# Patient Record
Sex: Male | Born: 1950 | Race: White | Hispanic: No | Marital: Married | Smoking: Former smoker
Health system: Southern US, Community
[De-identification: ages and names within clinical notes are randomized; demographics above are authoritative.]

## PROBLEM LIST (undated history)

## (undated) DIAGNOSIS — I10 Essential (primary) hypertension: Secondary | ICD-10-CM

## (undated) DIAGNOSIS — I2510 Atherosclerotic heart disease of native coronary artery without angina pectoris: Secondary | ICD-10-CM

## (undated) DIAGNOSIS — K635 Polyp of colon: Secondary | ICD-10-CM

## (undated) DIAGNOSIS — I252 Old myocardial infarction: Secondary | ICD-10-CM

## (undated) DIAGNOSIS — I219 Acute myocardial infarction, unspecified: Secondary | ICD-10-CM

## (undated) DIAGNOSIS — M1990 Unspecified osteoarthritis, unspecified site: Secondary | ICD-10-CM

## (undated) DIAGNOSIS — E785 Hyperlipidemia, unspecified: Secondary | ICD-10-CM

## (undated) DIAGNOSIS — Z951 Presence of aortocoronary bypass graft: Secondary | ICD-10-CM

## (undated) DIAGNOSIS — R7303 Prediabetes: Secondary | ICD-10-CM

## (undated) DIAGNOSIS — K2270 Barrett's esophagus without dysplasia: Secondary | ICD-10-CM

## (undated) DIAGNOSIS — K219 Gastro-esophageal reflux disease without esophagitis: Secondary | ICD-10-CM

## (undated) HISTORY — DX: Acute myocardial infarction, unspecified: I21.9

## (undated) HISTORY — PX: EYE SURGERY: SHX253

## (undated) HISTORY — DX: Barrett's esophagus without dysplasia: K22.70

## (undated) HISTORY — DX: Prediabetes: R73.03

## (undated) HISTORY — DX: Hyperlipidemia, unspecified: E78.5

## (undated) HISTORY — PX: HEMORROIDECTOMY: SUR656

## (undated) HISTORY — DX: Gastro-esophageal reflux disease without esophagitis: K21.9

## (undated) HISTORY — DX: Presence of aortocoronary bypass graft: Z95.1

## (undated) HISTORY — DX: Atherosclerotic heart disease of native coronary artery without angina pectoris: I25.10

## (undated) HISTORY — DX: Essential (primary) hypertension: I10

## (undated) HISTORY — DX: Polyp of colon: K63.5

## (undated) HISTORY — DX: Unspecified osteoarthritis, unspecified site: M19.90

## (undated) HISTORY — DX: Old myocardial infarction: I25.2

---

## 1978-10-24 HISTORY — PX: OTHER SURGICAL HISTORY: SHX169

## 2003-06-16 ENCOUNTER — Encounter (INDEPENDENT_AMBULATORY_CARE_PROVIDER_SITE_OTHER): Payer: Self-pay | Admitting: Specialist

## 2003-06-16 ENCOUNTER — Ambulatory Visit (HOSPITAL_COMMUNITY): Admission: RE | Admit: 2003-06-16 | Discharge: 2003-06-16 | Payer: Self-pay | Admitting: *Deleted

## 2004-07-30 ENCOUNTER — Ambulatory Visit: Admission: RE | Admit: 2004-07-30 | Discharge: 2004-07-30 | Payer: Self-pay | Admitting: Endocrinology

## 2004-08-31 ENCOUNTER — Encounter (INDEPENDENT_AMBULATORY_CARE_PROVIDER_SITE_OTHER): Payer: Self-pay | Admitting: *Deleted

## 2004-08-31 ENCOUNTER — Ambulatory Visit (HOSPITAL_COMMUNITY): Admission: RE | Admit: 2004-08-31 | Discharge: 2004-08-31 | Payer: Self-pay | Admitting: *Deleted

## 2004-09-15 ENCOUNTER — Ambulatory Visit: Payer: Self-pay | Admitting: Pulmonary Disease

## 2004-10-19 ENCOUNTER — Ambulatory Visit: Payer: Self-pay | Admitting: Pulmonary Disease

## 2004-10-24 HISTORY — PX: LUNG BIOPSY: SHX232

## 2004-12-11 IMAGING — CR DG CHEST 1V PORT
1 series · 1 of 1 positions shown · non-contrast
Comparison: Done earlier the same day at [6J] hours.
 SINGLE VIEW CHEST PORTABLE:

CLINICAL DATA: Interstitial lung disease.

[view not recorded]
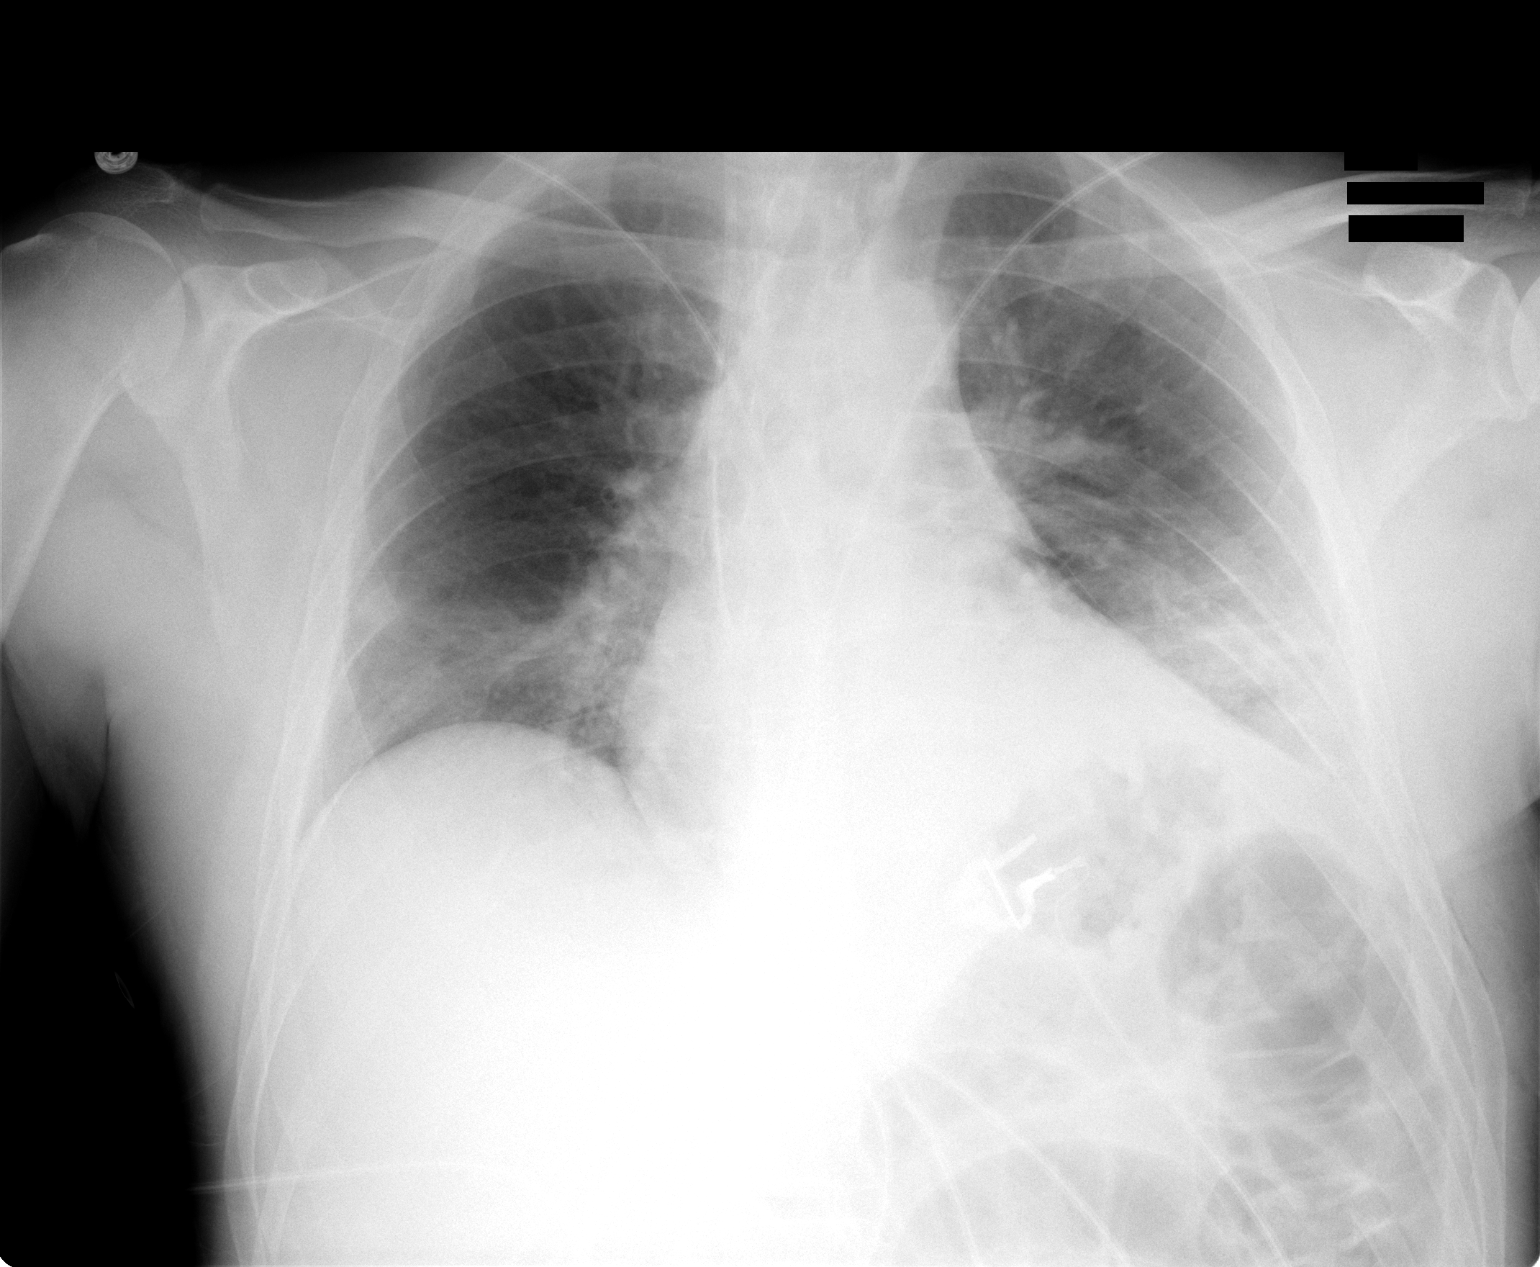

[1 of 1 positions shown; findings below may reference images not displayed]

FINDINGS: Left chest tube has been removed.  No pneumothorax.  Bibasilar air space disease is unchanged.
IMPRESSION: 1.  Interval removal of left chest tube without pneumothorax.
 2.   Stable bibasilar air space disease in this patient with history of   interstitial lung disease.

## 2005-01-17 ENCOUNTER — Ambulatory Visit: Payer: Self-pay | Admitting: Pulmonary Disease

## 2005-02-10 ENCOUNTER — Ambulatory Visit: Admission: RE | Admit: 2005-02-10 | Discharge: 2005-02-10 | Payer: Self-pay | Admitting: Pulmonary Disease

## 2005-02-10 ENCOUNTER — Ambulatory Visit: Payer: Self-pay | Admitting: Pulmonary Disease

## 2005-05-27 ENCOUNTER — Ambulatory Visit: Payer: Self-pay | Admitting: Pulmonary Disease

## 2005-05-30 ENCOUNTER — Ambulatory Visit: Payer: Self-pay

## 2005-06-03 ENCOUNTER — Ambulatory Visit (HOSPITAL_COMMUNITY): Admission: RE | Admit: 2005-06-03 | Discharge: 2005-06-03 | Payer: Self-pay | Admitting: Pulmonary Disease

## 2005-06-03 IMAGING — CT CT CHEST W/ CM
1 of 9 series · 9 of 32 positions shown, 12 images · IV contrast (omnipaque)
Comparison: none

CLINICAL DATA: One month shortness of breath, weight loss, and cough.
CHEST CT WITH CONTRAST ? [DATE]:
TECHNIQUE: Multidetector CT imaging of the chest was performed following the standard protocol during bolus administration of intravenous contrast.  High resolution lung windows were obtained at 1 mm collimation.
Contrast:  100 cc Omnipaque 300 IV.  
No comparisons.

[Series 9: chest routine 5.0 b30f · axial · 0.65mm/px · z∈[-291,-46]mm · 9 of 65 slices shown, 12 images]
[im 8/65  mediastinal]
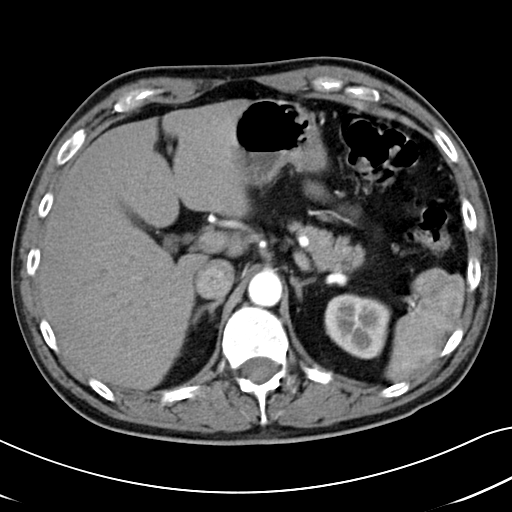
[im 8/65  lung]
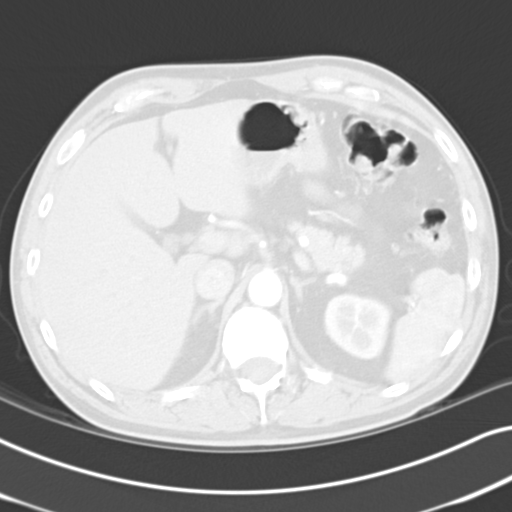
[im 15/65  lung]
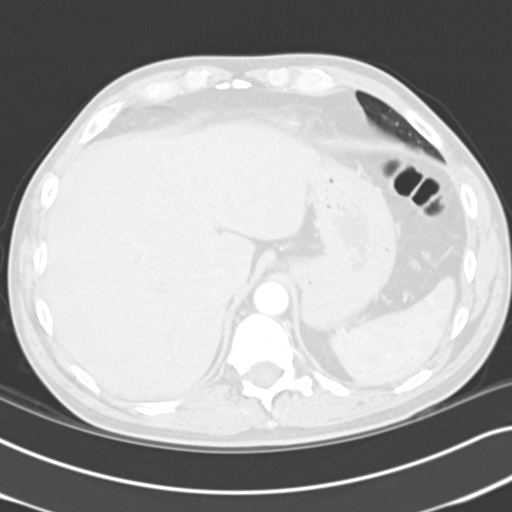
[im 22/65  lung]
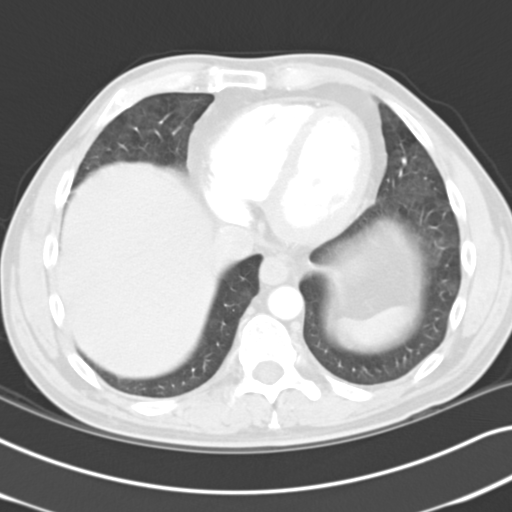
[im 29/65  lung]
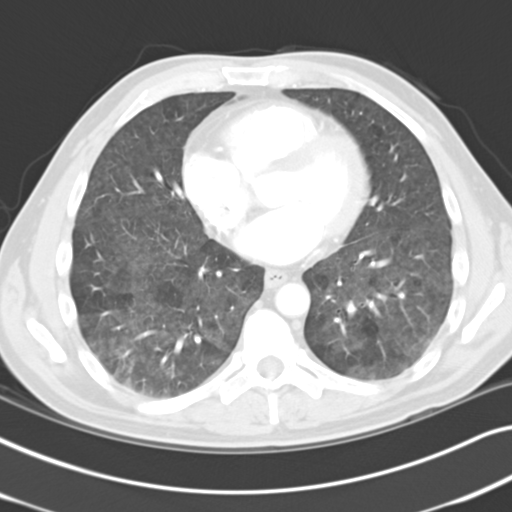
[im 32/65  mediastinal]
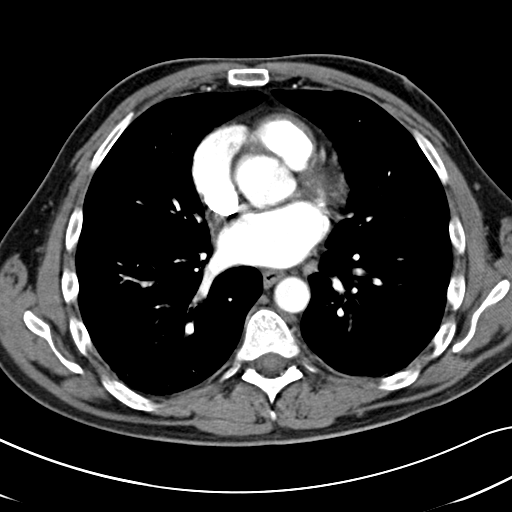
[im 32/65  lung]
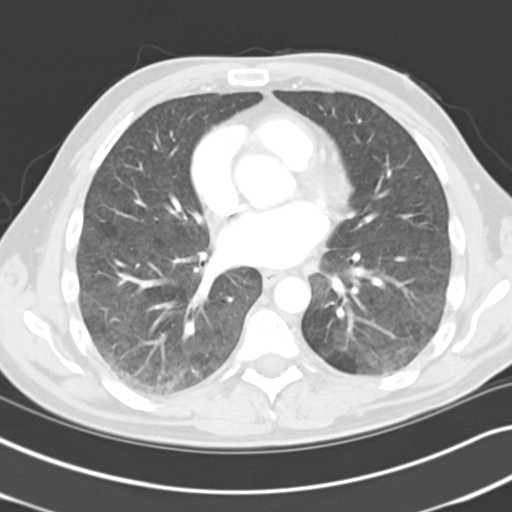
[im 36/65  lung]
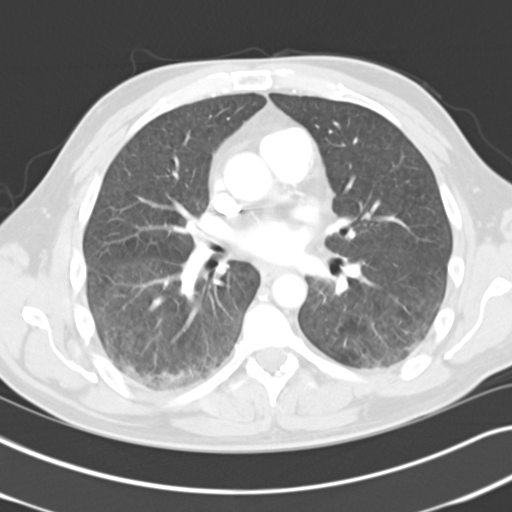
[im 43/65  lung]
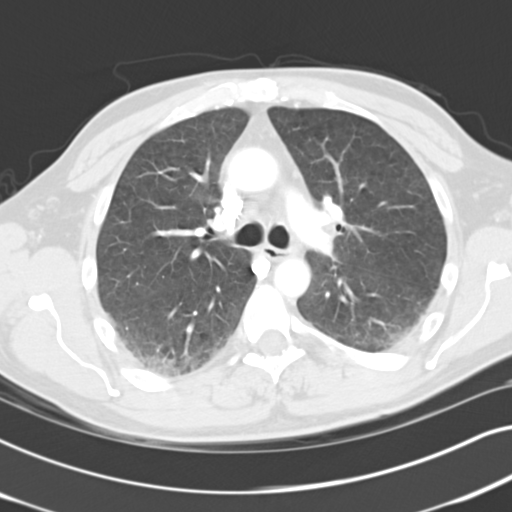
[im 50/65  lung]
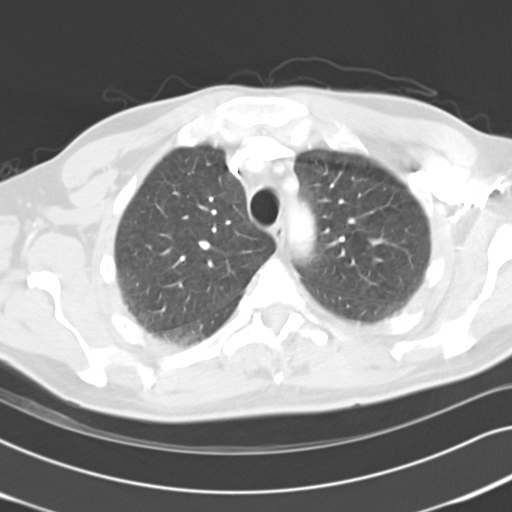
[im 57/65  mediastinal]
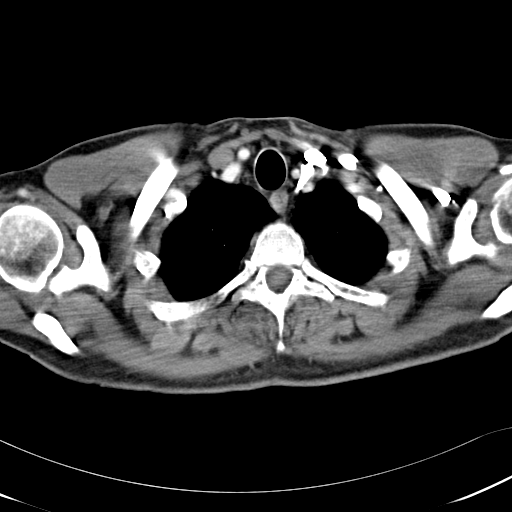
[im 57/65  lung]
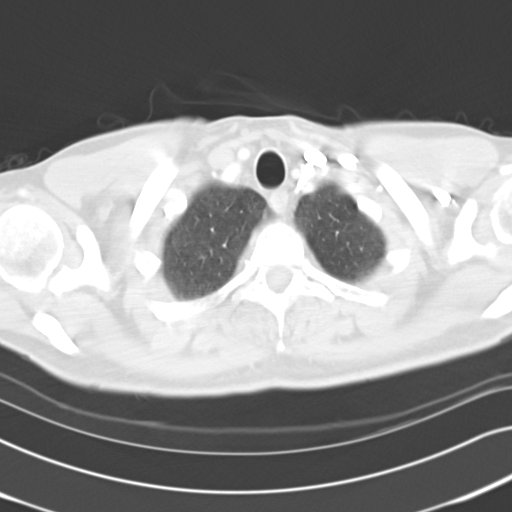

[9 of 32 positions shown; findings below may reference images not displayed]

FINDINGS: Best seen on high resolution pulmonary windows is diffuse inhomogeneous lung attenuation/ground glass opacites most prominently at the lower lobes.  The arborizing vessels in the areas of lower and higher attenuation are comparable in size favoring ground-glass pulmonary opacity over mosaic perfusion abnormality.)  This is a nonspecific finding and may be seen in viral pneumonitis (and in immunocompromised patients, pneumocystis carinii ? cytomegalovirus, and respiratory syncytial virus,etc.), hypersensitivity pneumonitis, Desquamative interstitial pneumonia (DIP), BOOP, and respiratory bronchiolitis interstitial lung disease (RBILD) to name some possibilities.  Increased number of small size mediastinal left hilar lymph nodes favors infectious inflammatory reactive adenitis with the largest subcarinal lymph node measuring 1.4 x 1.1 cm (image #25) and left mediastinal 1.5 x 0.8 cm and 7 mm left hilar lymph nodes (image #28).  No other significant mediastinal, hilar, or axillary adenopathy is seen with normal heart size with left ventricular hypertrophy.  Upper abdominal organs appear normal.
IMPRESSION: 1.  Areas of diffuse inhomogeneous lung attenuation/ground-glass opacities primarily at the lower lobe perihilar region with some differential diagnostic considerations considered (FINDINGS).  
2.  Slight mediastinal adenopathy favoring infectious inflammatory etiology.
3.  Slight left ventricular hypertrophy.
4.  Otherwise no significant abnormality.

## 2005-06-08 ENCOUNTER — Ambulatory Visit: Payer: Self-pay | Admitting: Pulmonary Disease

## 2005-07-07 IMAGING — CR DG CHEST 2V
3 series · 3 of 3 positions shown · non-contrast
Comparison: none

CLINICAL DATA: Interstitial lung disease, pre-admission respiratory film.  Lung mass.      
 CHEST - 2 VIEW:

[view not recorded (1 of 3)]
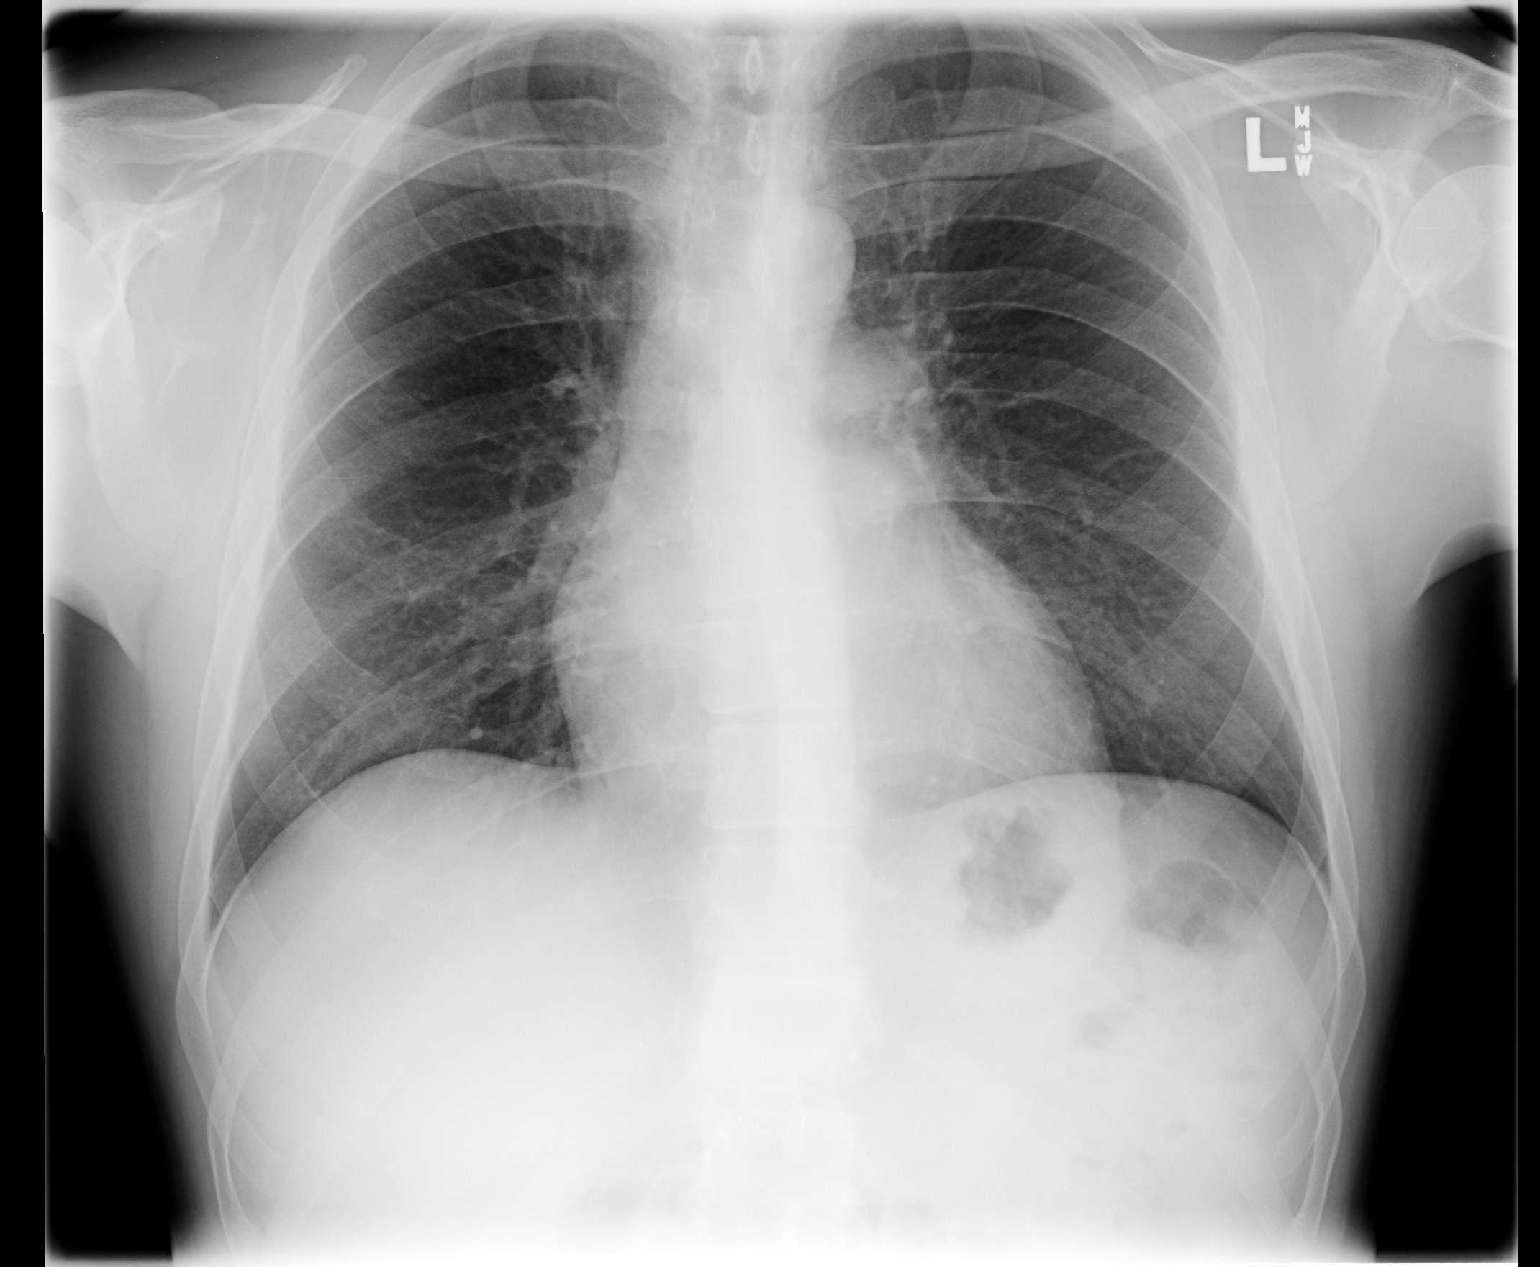

[view not recorded (2 of 3)]
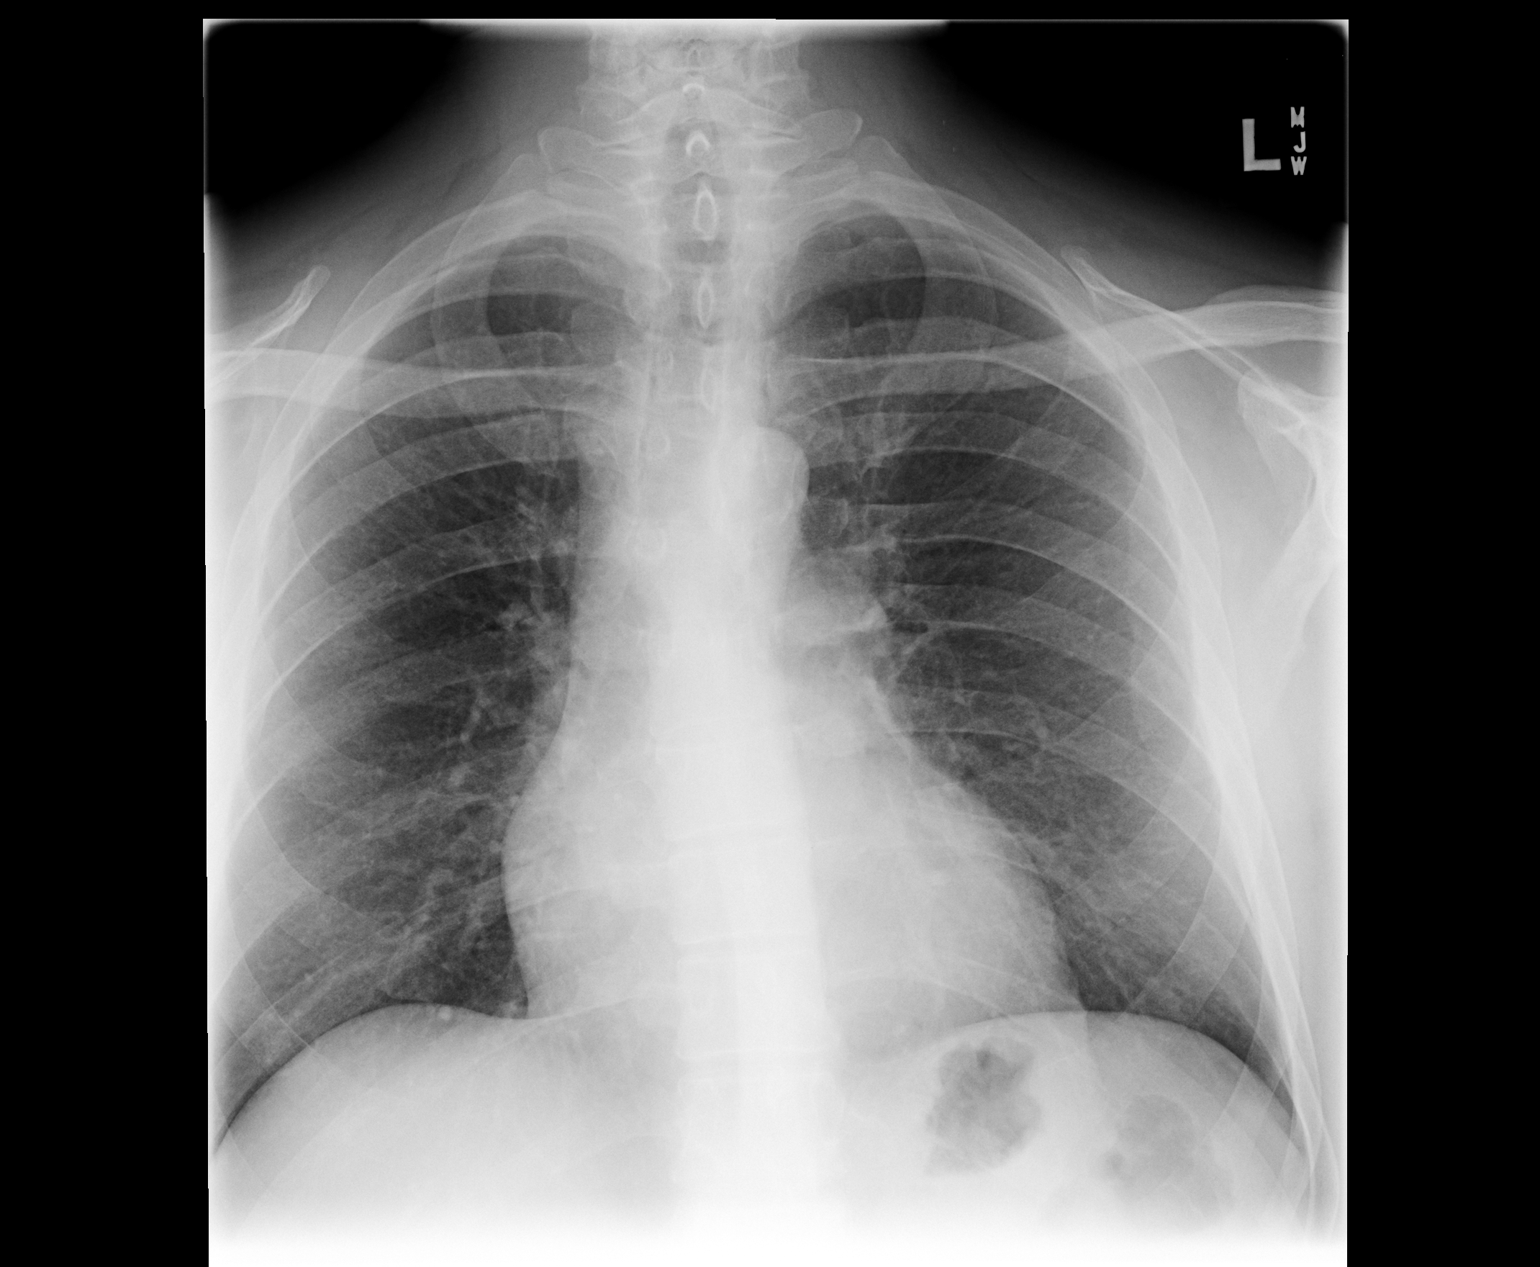

[view not recorded (3 of 3)]
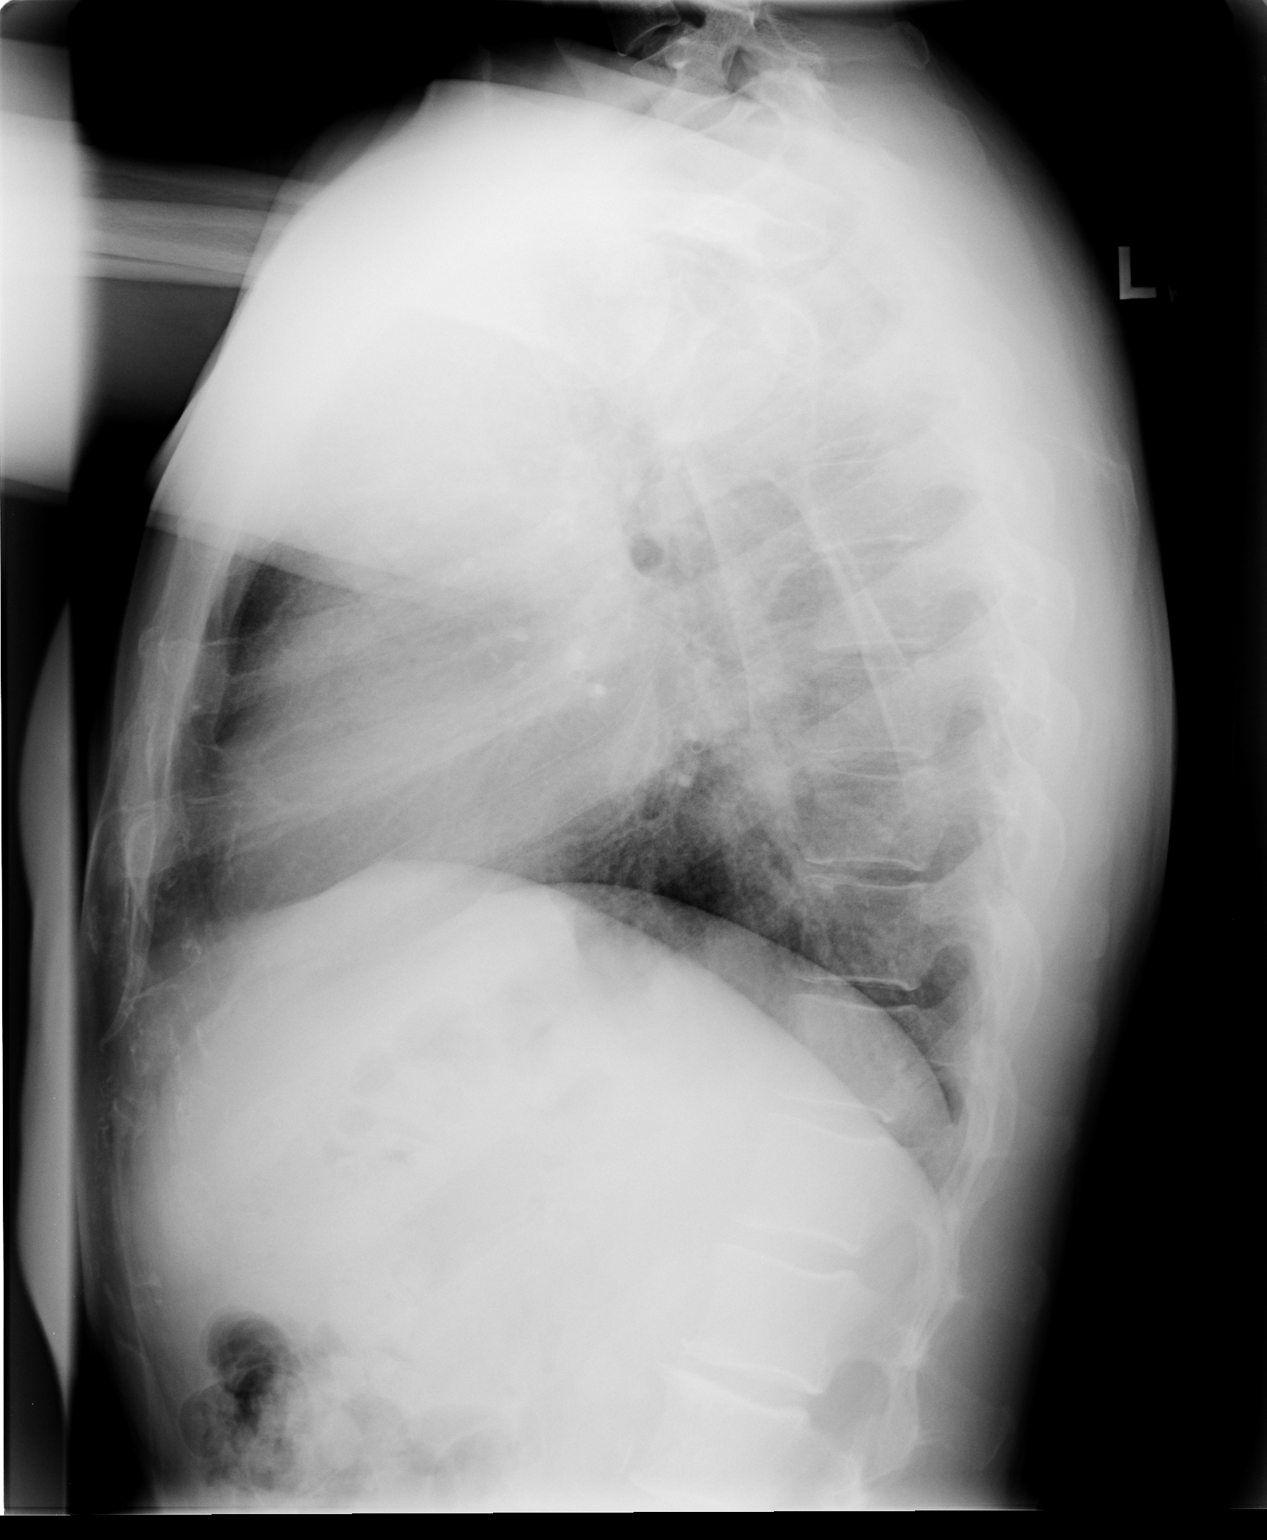

[3 of 3 positions shown; findings below may reference images not displayed]

FINDINGS: Lungs are clear.  Heart size is normal.  No effusion.
IMPRESSION: No acute disease.

## 2005-07-11 ENCOUNTER — Encounter (INDEPENDENT_AMBULATORY_CARE_PROVIDER_SITE_OTHER): Payer: Self-pay | Admitting: *Deleted

## 2005-07-11 ENCOUNTER — Inpatient Hospital Stay (HOSPITAL_COMMUNITY): Admission: RE | Admit: 2005-07-11 | Discharge: 2005-07-16 | Payer: Self-pay | Admitting: Thoracic Surgery

## 2005-07-11 IMAGING — CR DG CHEST 1V PORT
1 series · 1 of 1 positions shown · non-contrast
Comparison: none

CLINICAL DATA: Lingular biopsy

[view not recorded]
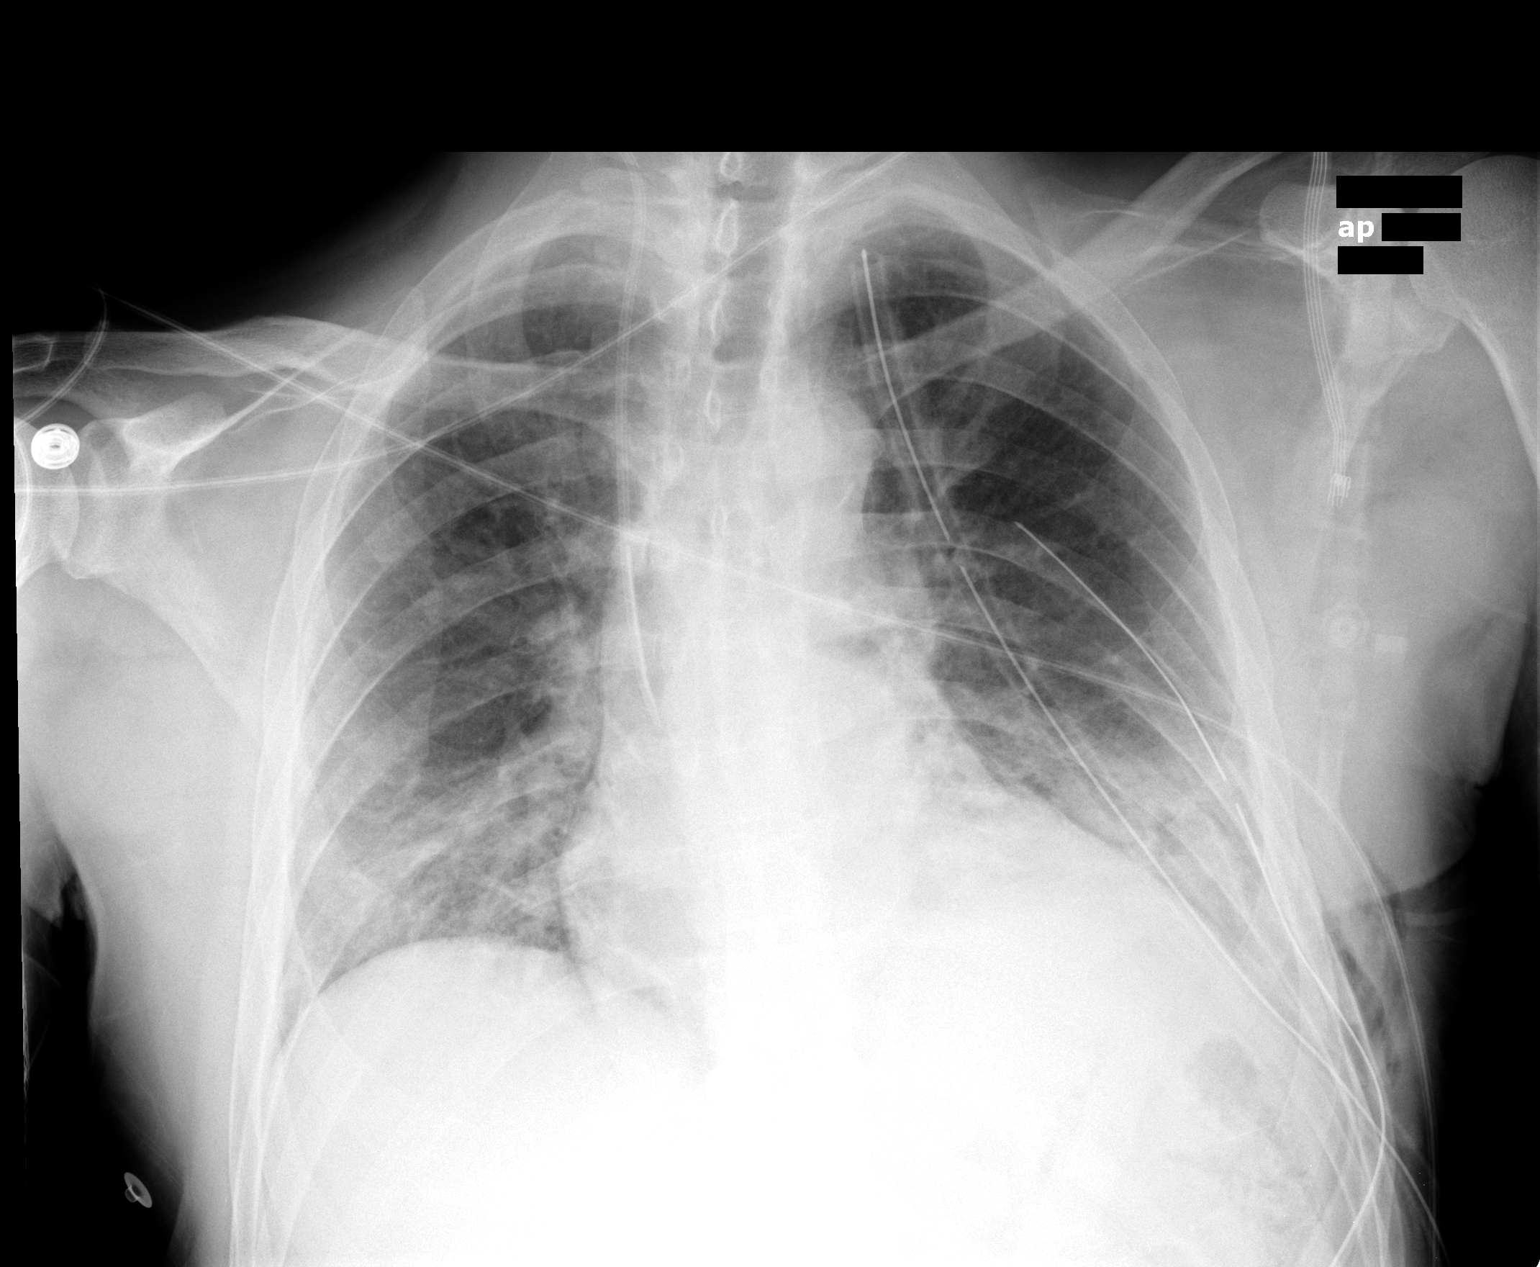

[1 of 1 positions shown; findings below may reference images not displayed]

Portable chest at 930:

Comparison to earlier film of the same day. There has been interval placement of
2 left chest tubes, with no pneumothorax seen. A right IJ central line has its
tip in the low SVC. Low lung volumes, with patchy infiltrates or atelectasis in
both lung bases left greater than right. No definite effusion. Heart size
appears upper limits normal.
IMPRESSION: 1. Two left chest tubes without pneumothorax.
2. Central line to low SVC. 
3. Bibasilar patchy infiltrates or atelectasis

## 2005-07-11 IMAGING — CR DG CHEST 2V
2 series · 2 of 2 positions shown · non-contrast
Comparison: Two view chest x-ray [DATE] and CT chest [DATE].

CLINICAL DATA: Interstitial lung disease. Pre-operative respiratory evaluation
prior to a VATS biopsy.

CHEST - 2 VIEW  [DATE]:

[view not recorded (1 of 2)]
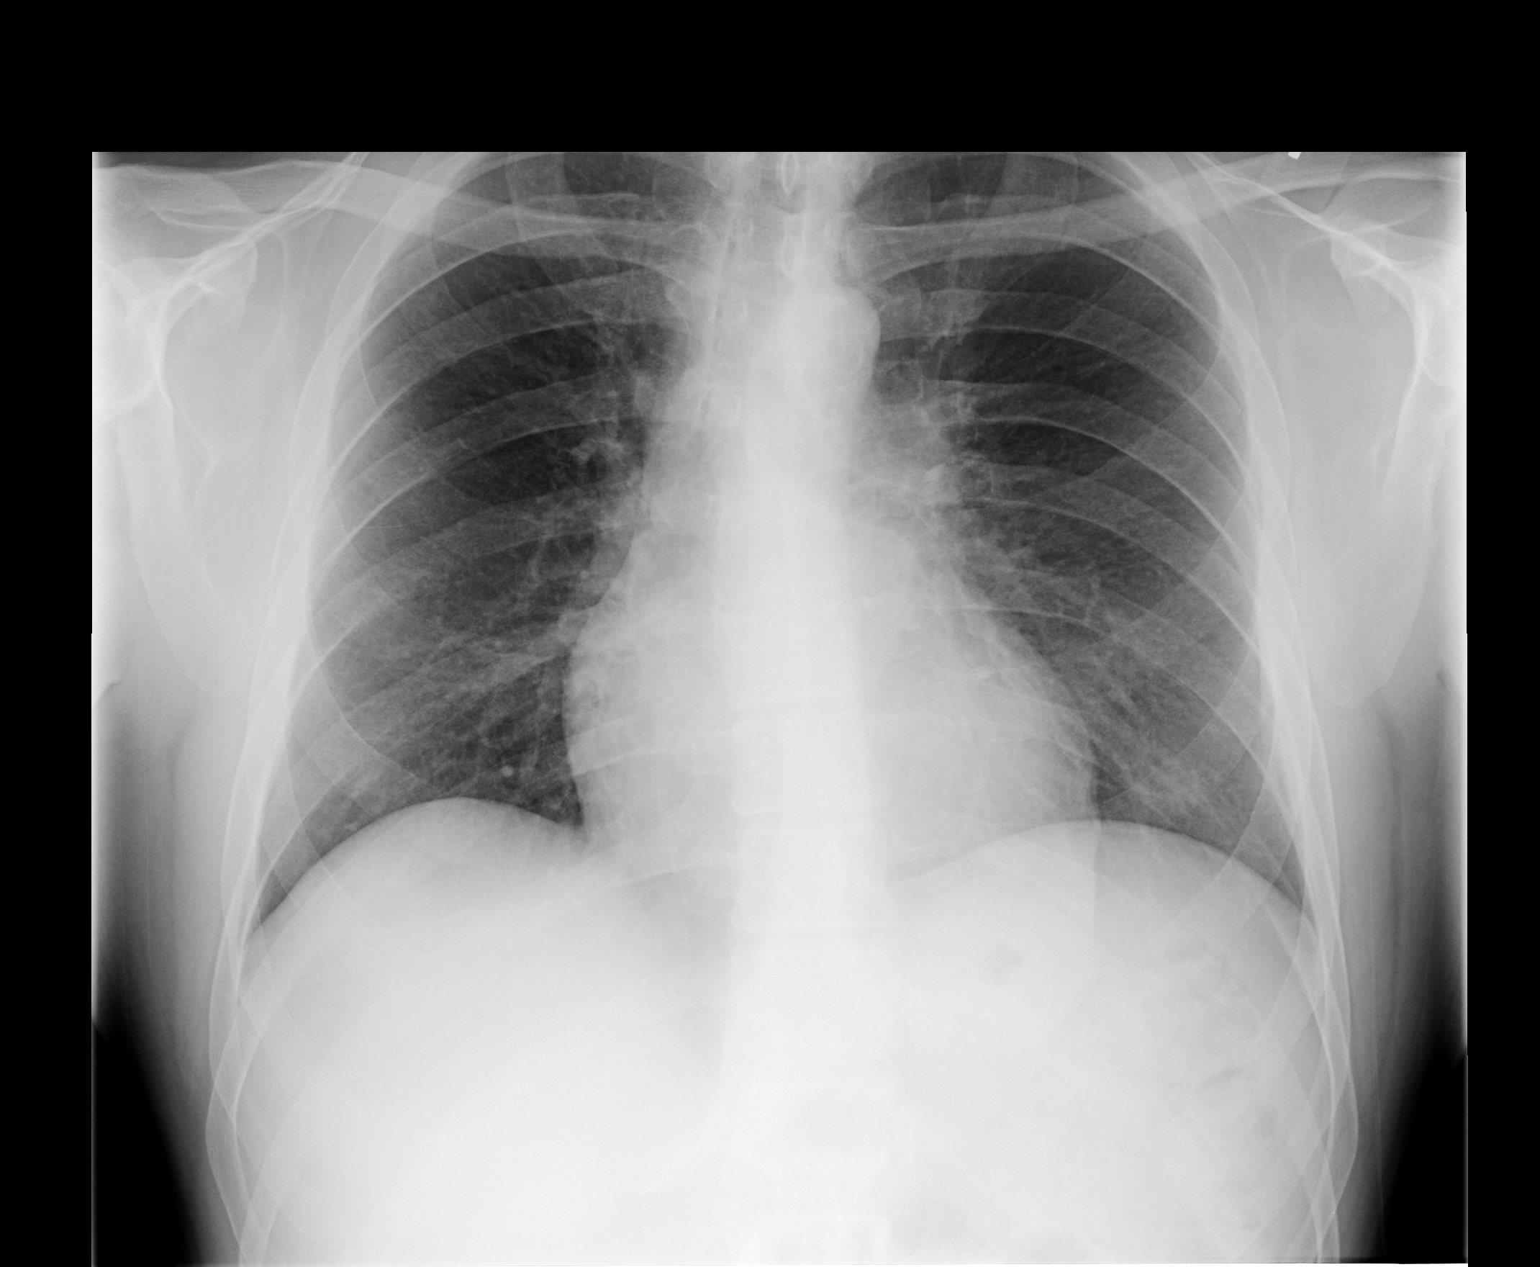

[view not recorded (2 of 2)]
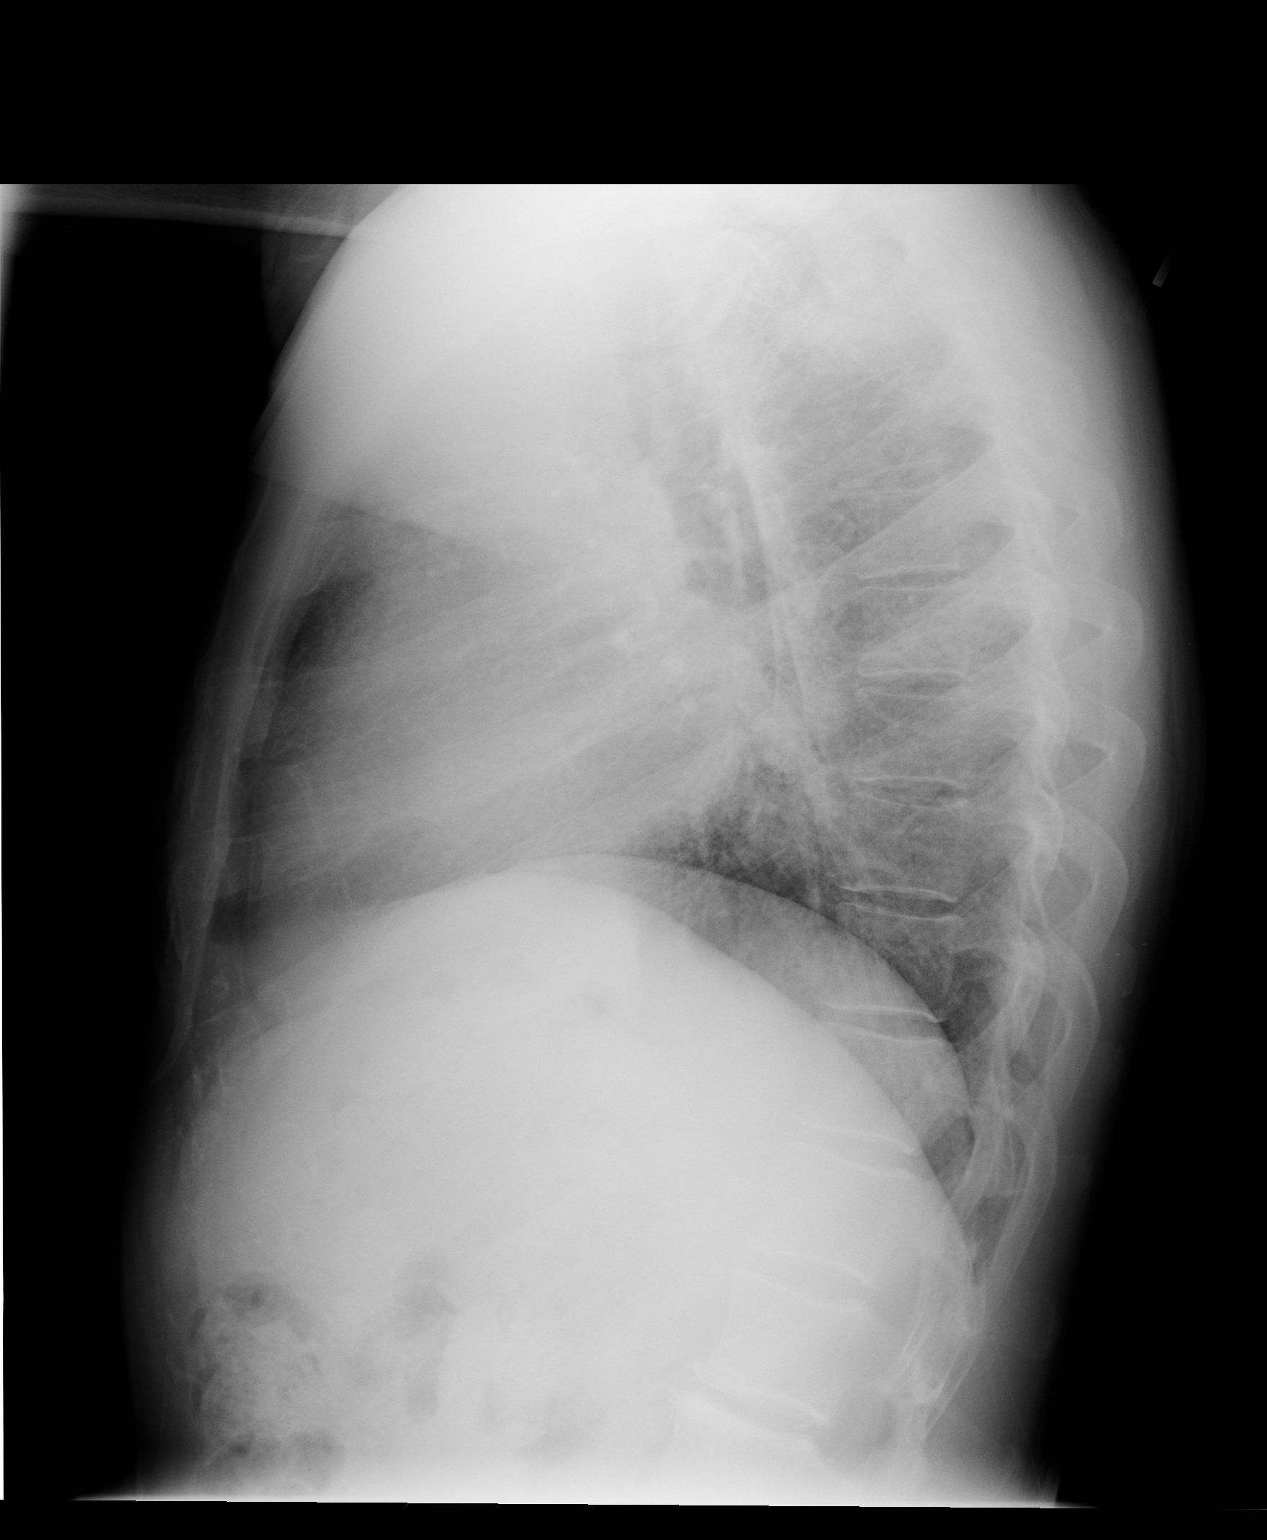

[2 of 2 positions shown; findings below may reference images not displayed]

FINDINGS: The heart size is normal. Minimal atherosclerosis is present in the
aortic arch. The hilar and mediastinal contours are otherwise unremarkable. Mild
diffuse interstitial lung disease is present and the lung volumes are low,
unchanged from before. There are no confluent areas of consolidation. There are
no pleural effusions. The visualized bony thorax appears intact.
IMPRESSION: Mild diffuse interstitial lung disease with low lung volumes. No acute
abnormalities.

## 2005-07-12 IMAGING — CR DG CHEST 1V PORT
1 series · 1 of 1 positions shown · non-contrast
Comparison: none

CLINICAL DATA: Interstitial lung disease.  
 PORTABLE ? 1 VIEW CHEST:

[view not recorded]
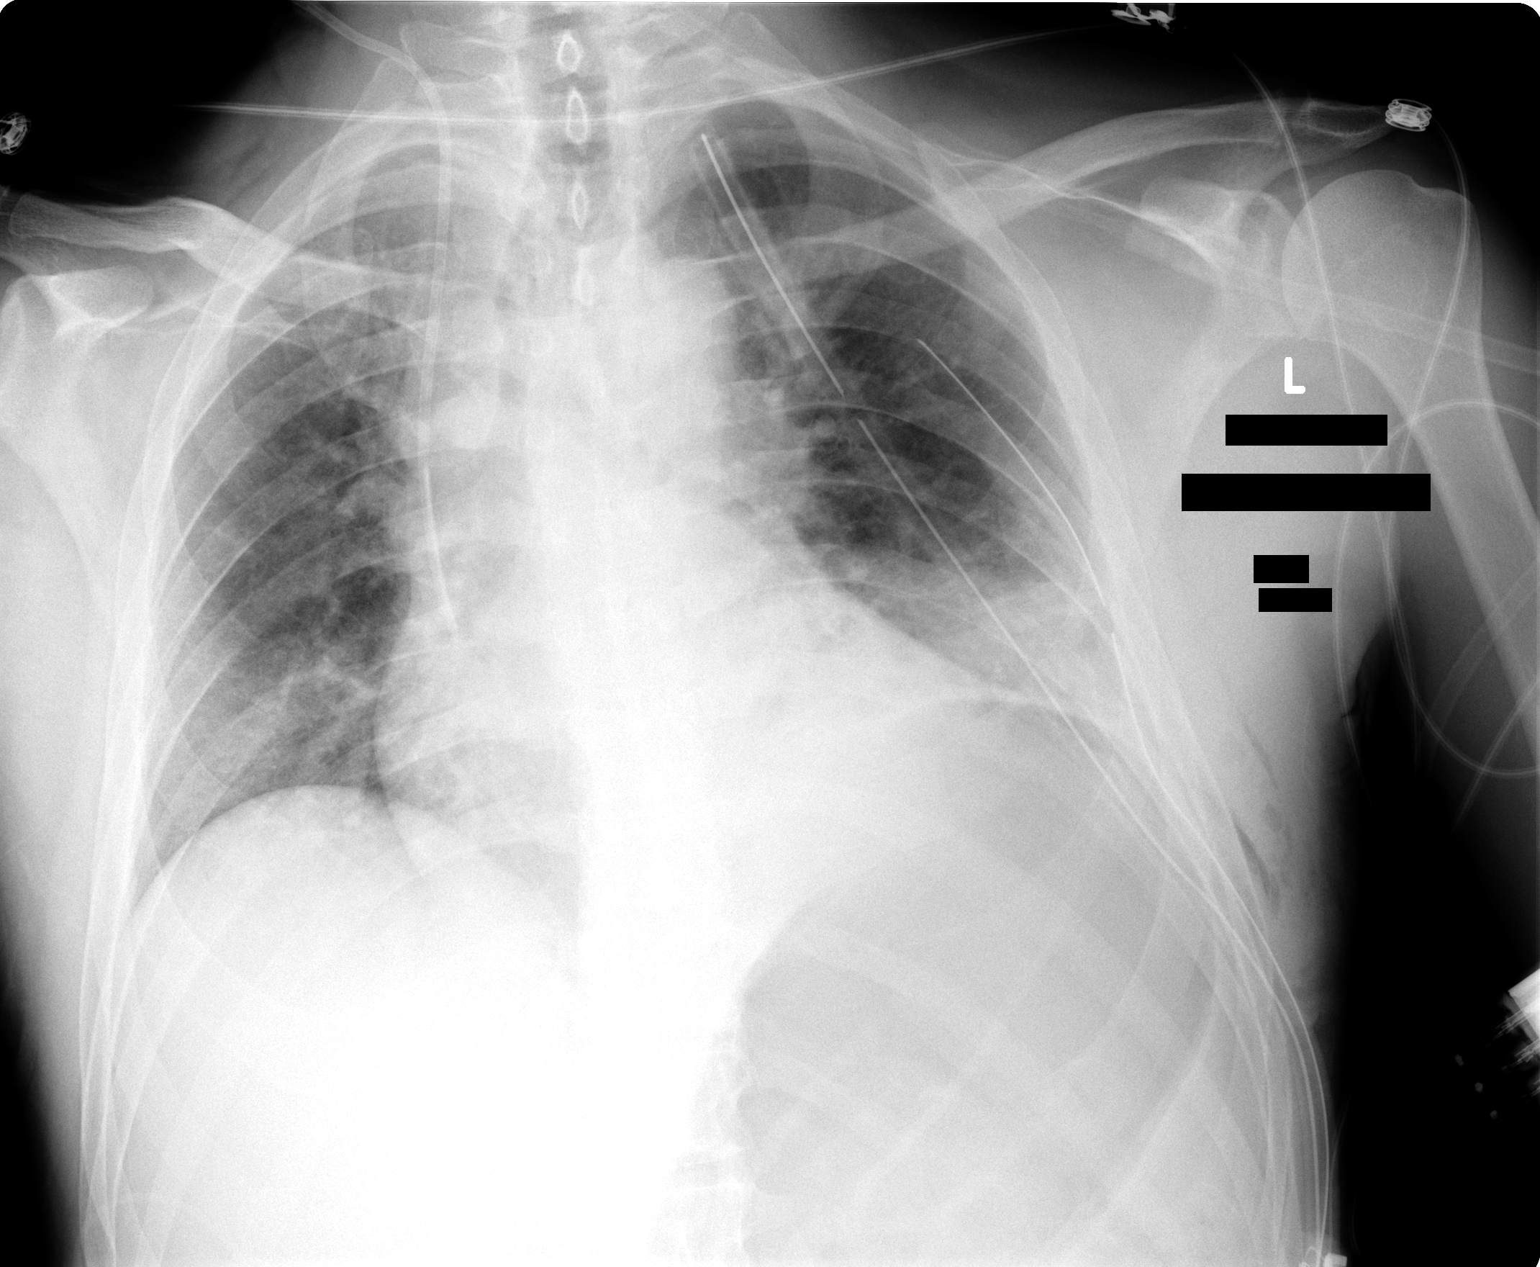

[1 of 1 positions shown; findings below may reference images not displayed]

FINDINGS: AP film at [DATE] hours shows two left chest tubes in place without evidence for pneumothorax.  There is left base atelectasis.  Right lung is stable.  The heart size is unchanged.  Right IJ central line remains in place. 
 Prominent gastric bubble noted on the current study.
IMPRESSION: 1.  Left base atelectasis without evidence for pneumothorax. 
 2.  Prominent gastric bubble.

## 2005-07-13 IMAGING — CR DG CHEST 1V PORT
1 series · 1 of 1 positions shown · non-contrast
Comparison: Portable chest x-ray yesterday.

CLINICAL DATA: Postop left VATS for interstitial disease.

PORTABLE CHEST - 1 VIEW  [DATE]/[PHONE_NUMBER] hours:

[view not recorded]
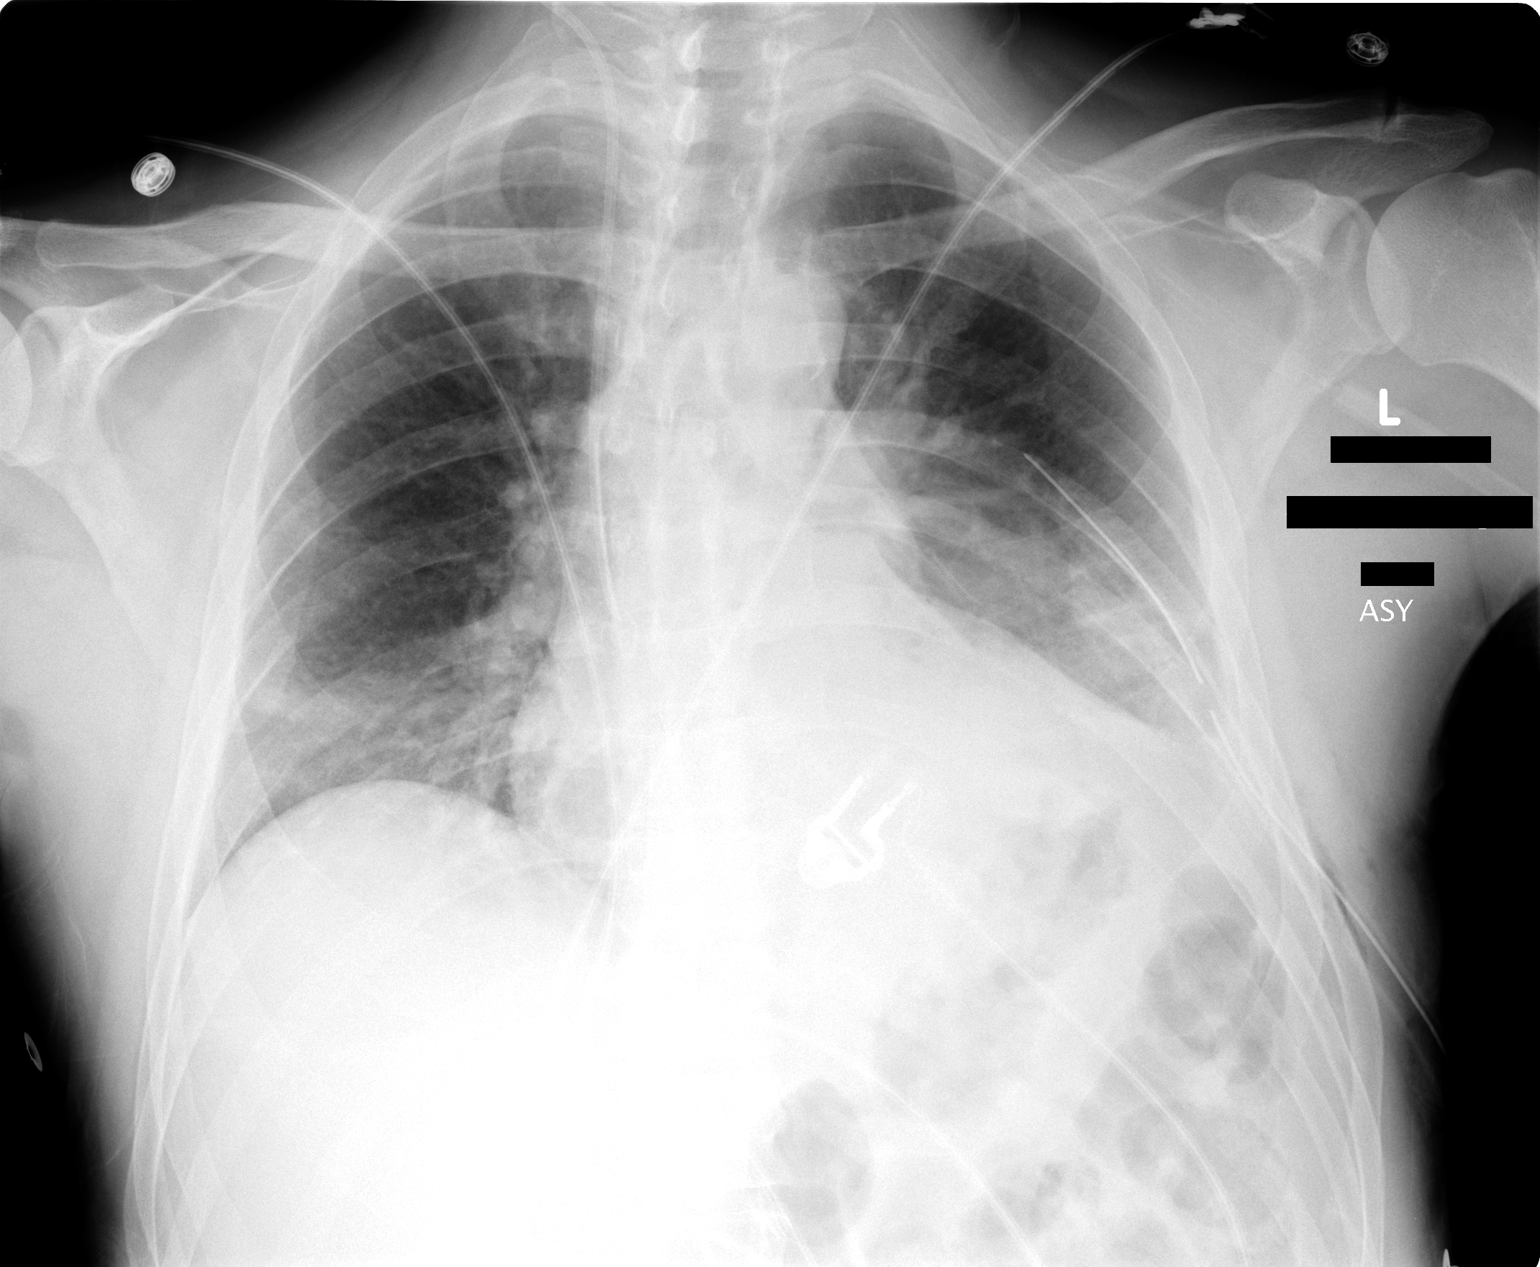

[1 of 1 positions shown; findings below may reference images not displayed]

FINDINGS: One of the 2 left chest tubes has been removed. I suspect a medial
left pneumothorax which is less than 5% in size. Atelectasis in the left lower
lobe is unchanged. The chronic interstitial opacities in the lung bases
associated with low lung volumes are stable. The right jugular central venous
catheter tip remains in SVC.
IMPRESSION: 1. Less than 5% medial left pneumothorax after removal of one of the chest
tubes.
2. Stable dense left lower lobe atelectasis superimposed upon the baseline
changes of interstitial disease in the bases.

## 2005-07-14 IMAGING — CR DG CHEST 1V PORT
1 series · 1 of 1 positions shown · non-contrast
Comparison: Yesterday.

CLINICAL DATA: Status post VATS.

PORTABLE CHEST - 1 VIEW

[view not recorded]
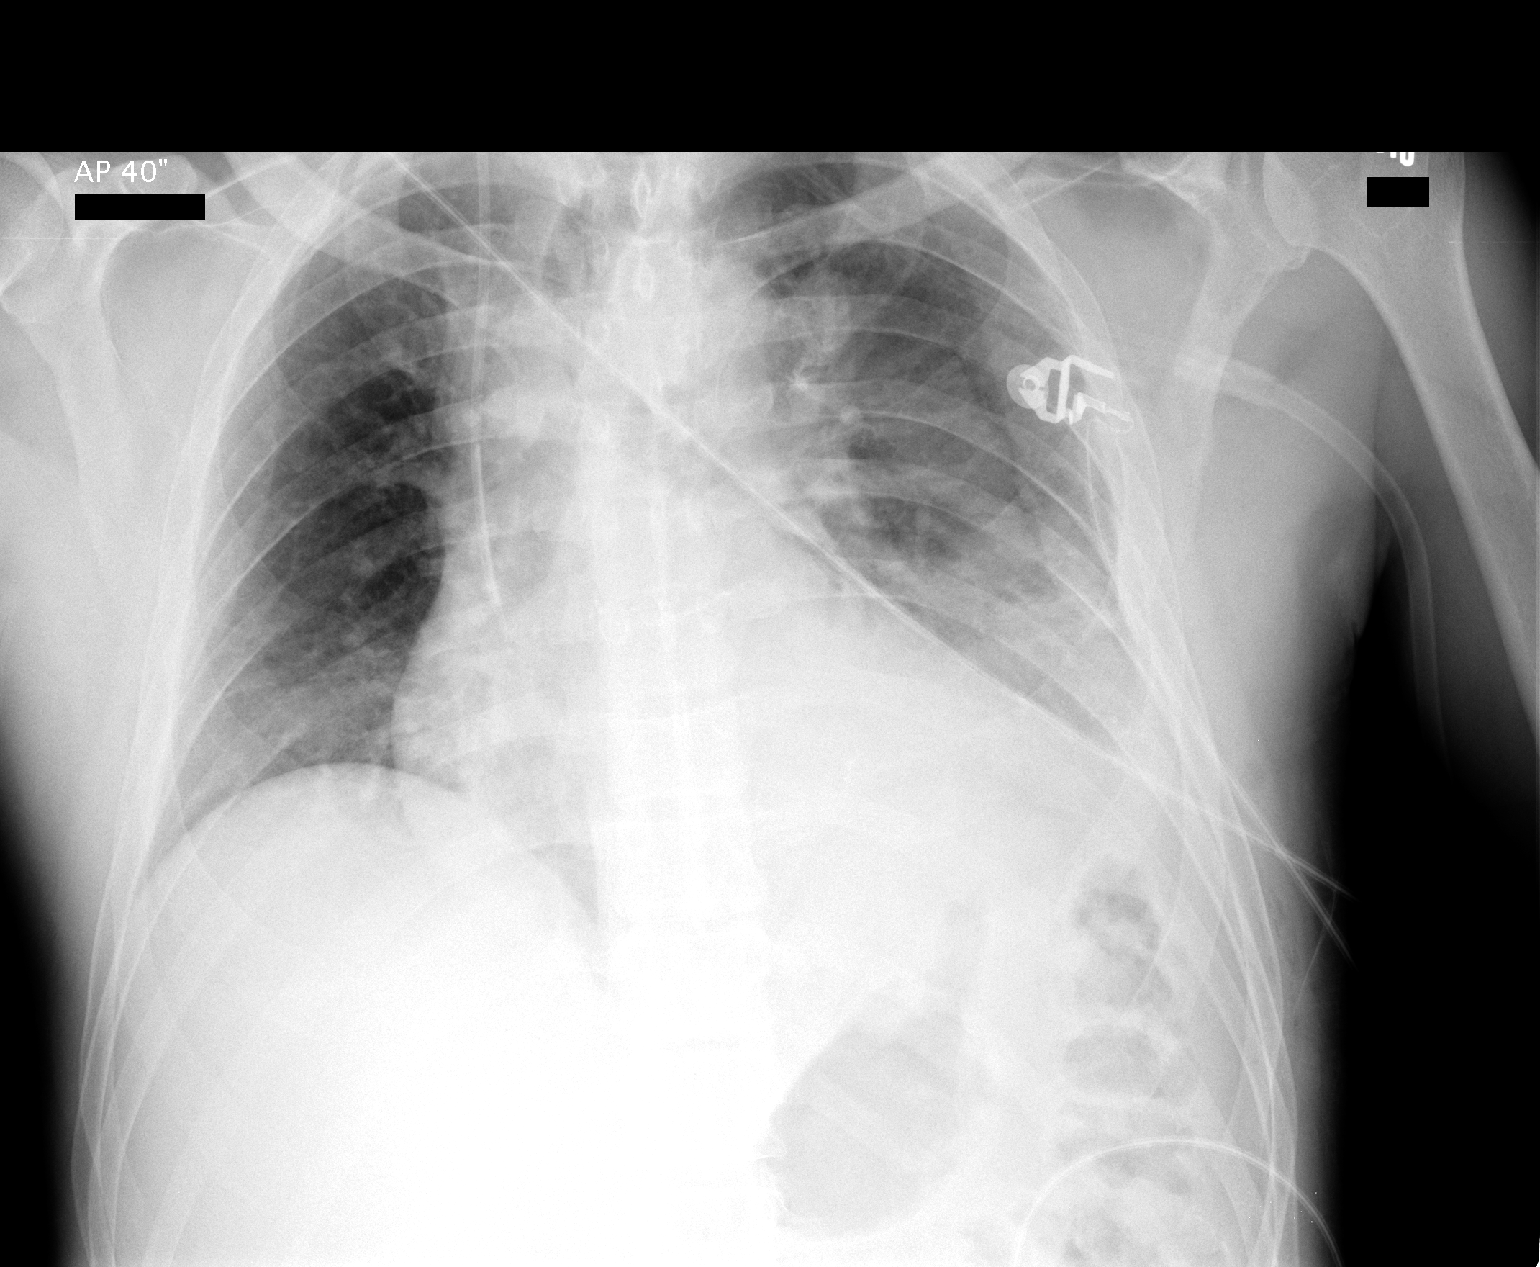

[1 of 1 positions shown; findings below may reference images not displayed]

FINDINGS: Stable right jugular catheter and enlarged cardiac silhouette. No
significant change in left basilar airspace opacity. Mildly improved right
basilar airspace opacity.  

IMPRESSION

1. Stable left basilar atelectasis or pneumonia and mildly improved right
basilar atelectasis or pneumonia.
2. Stable cardiomegaly.

## 2005-07-15 IMAGING — CR DG CHEST 1V PORT
1 series · 1 of 1 positions shown · non-contrast
Comparison: [DATE].

CLINICAL DATA: Follow-up lung lesion. 
 PORTABLE CHEST - 1 VIEW:

[view not recorded]
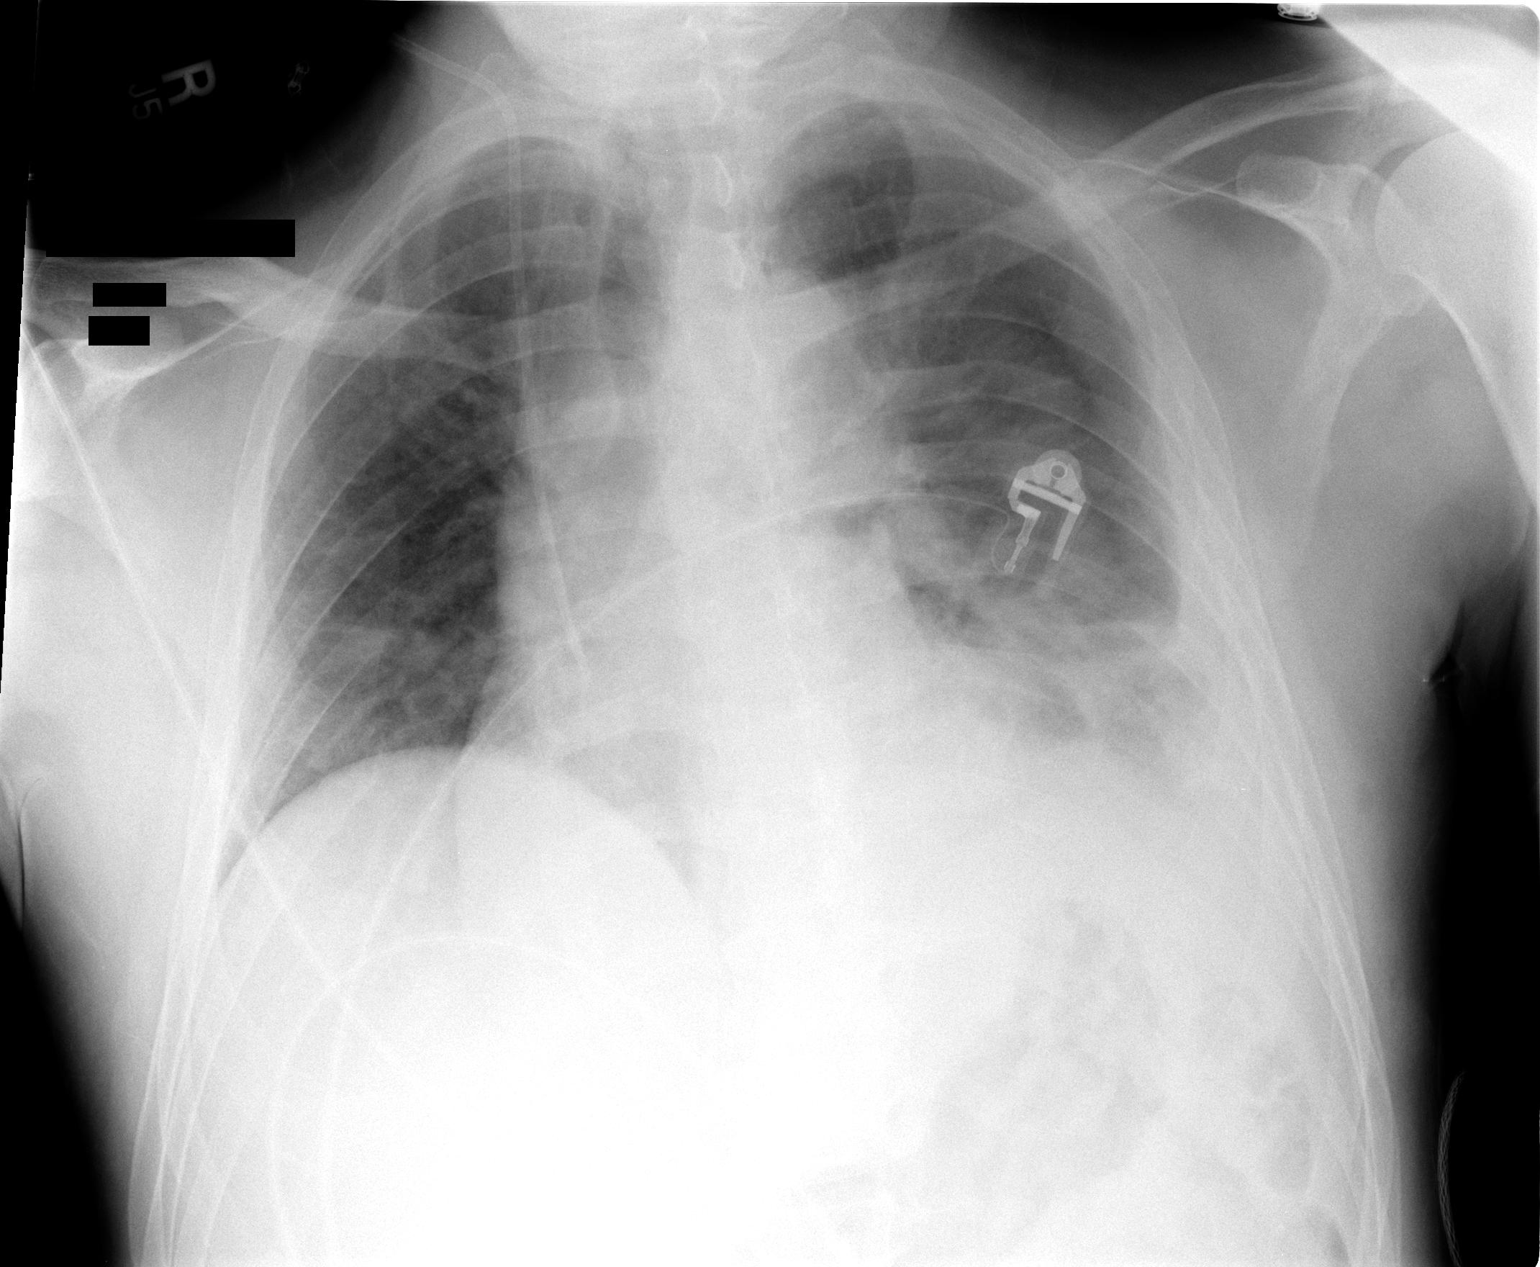

[1 of 1 positions shown; findings below may reference images not displayed]

FINDINGS: Bilateral lower lobe densities about the same allowing for differences in technique.  There is some breathing motion artifact on today?s exam.  I do not think there has been a significant change. Central line unchanged.
IMPRESSION: No significant change allowing for differences in technique.

## 2005-07-16 IMAGING — CR DG CHEST 2V
2 series · 2 of 2 positions shown · non-contrast
Comparison: [DATE] and [DATE].

CLINICAL DATA: 54 year old male; interstitial lung disease.  Status post VATS.
 2 VIEW DIAGNOSTIC CHEST RADIOGRAPH - [DATE]:

[w chest pa]
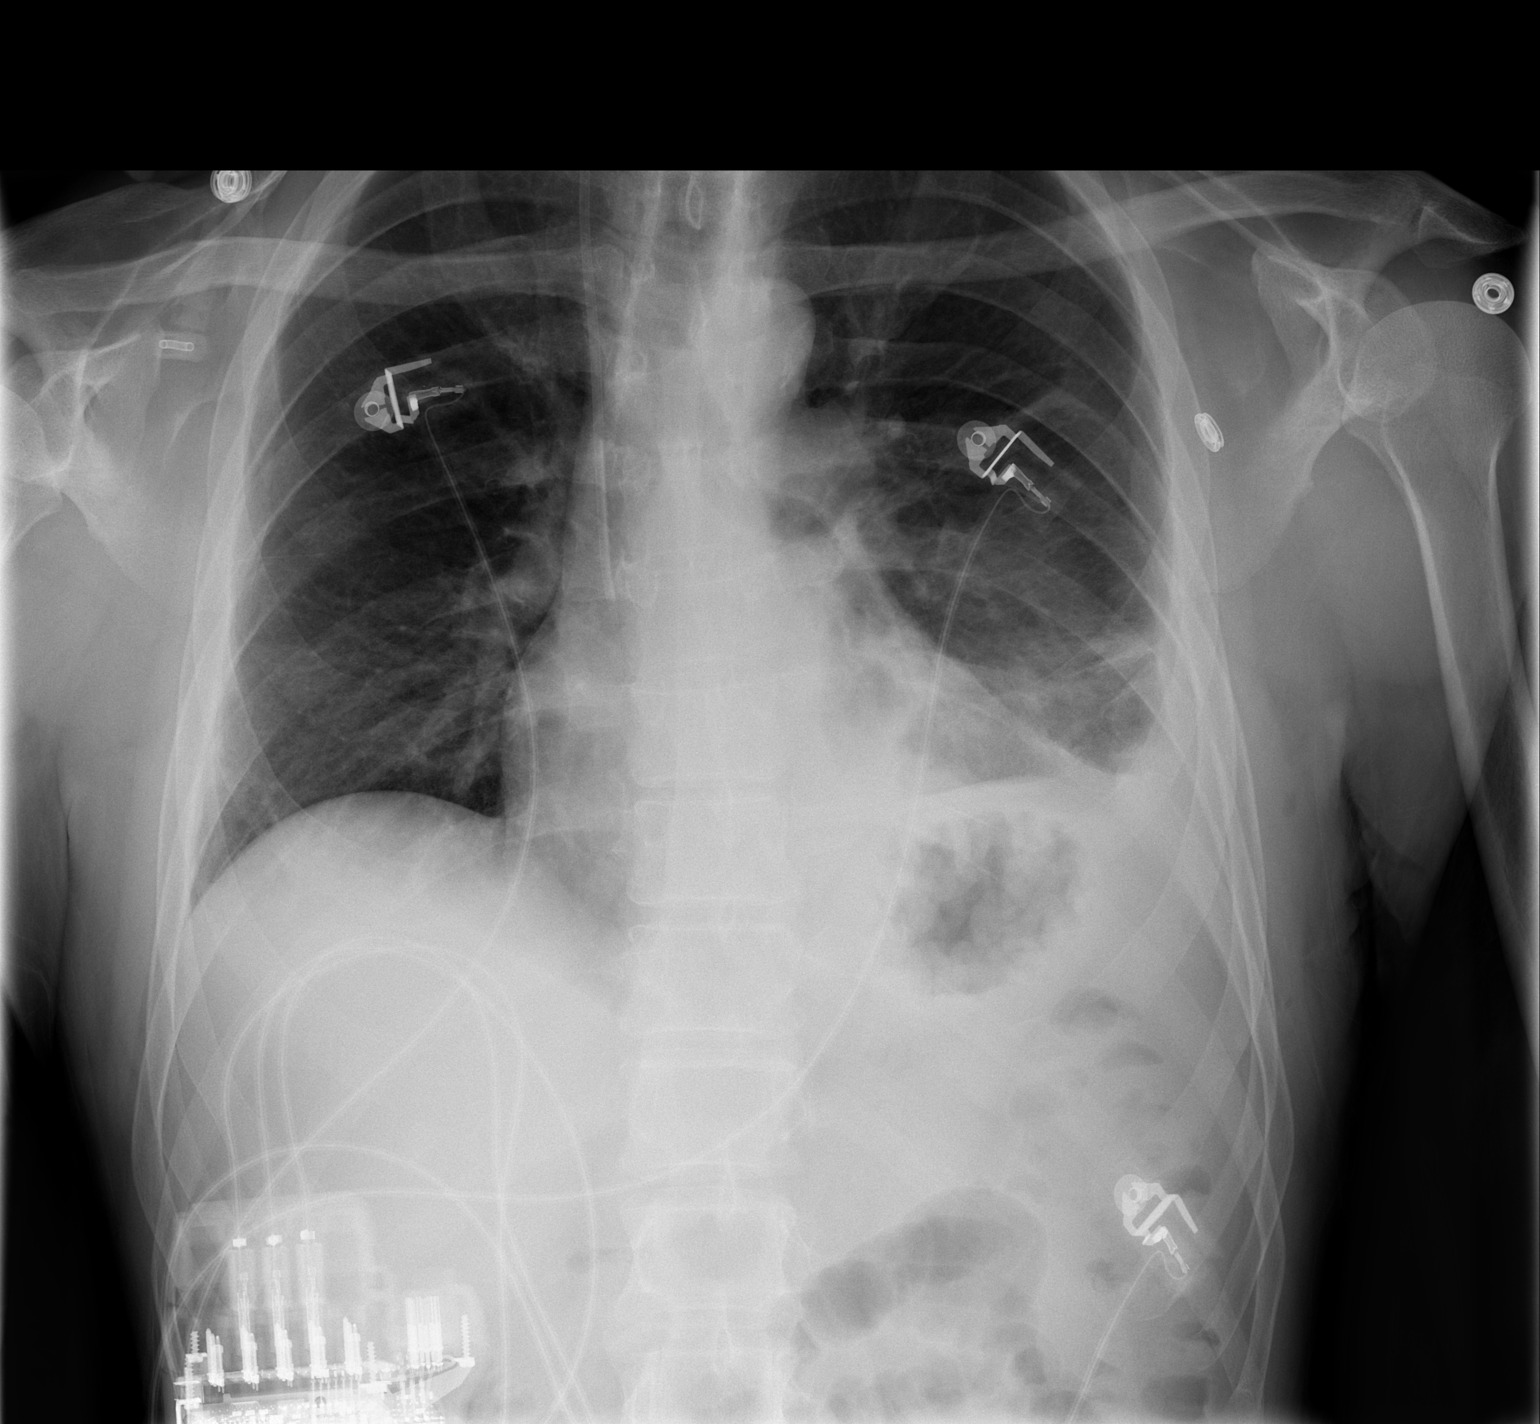

[w chest lat]
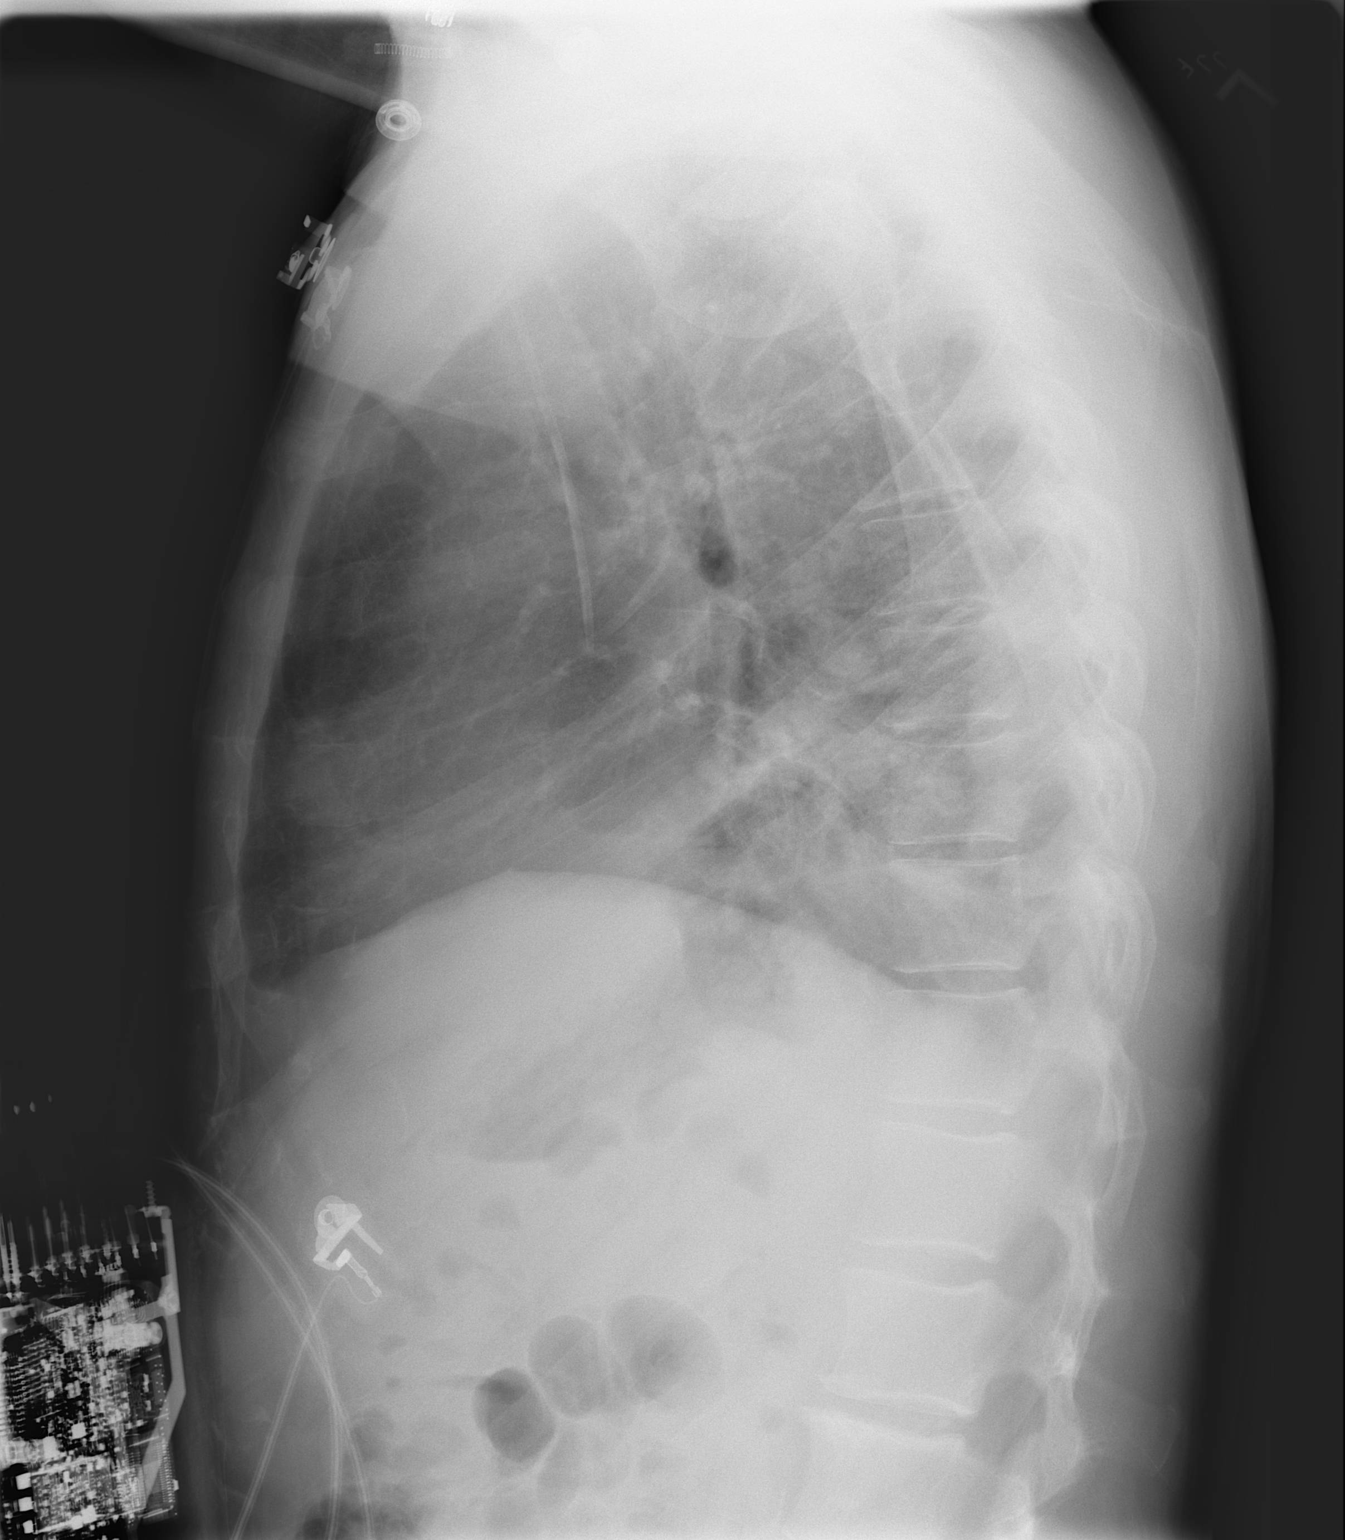

[2 of 2 positions shown; findings below may reference images not displayed]

FINDINGS: The right IJ central line tip is in the SVC-RA junction.  Lung volumes rema[REDACTED]reased.  Left basilar atelectasis/air space disease remains with pleural thickening and a small effusion.  No pneumothorax.
IMPRESSION: 1.  Stable left base atelectasis and/or air space disease with small effusion.
 2.  No pneumothorax.

## 2005-07-20 ENCOUNTER — Encounter: Admission: RE | Admit: 2005-07-20 | Discharge: 2005-07-20 | Payer: Self-pay | Admitting: Thoracic Surgery

## 2005-07-20 IMAGING — CR DG CHEST 2V
2 series · 2 of 2 positions shown · non-contrast
Comparison: [DATE].

CLINICAL DATA: Lung biopsy one week ago for interstitial lung disease.

CHEST - 2 VIEW

[w chest pa]
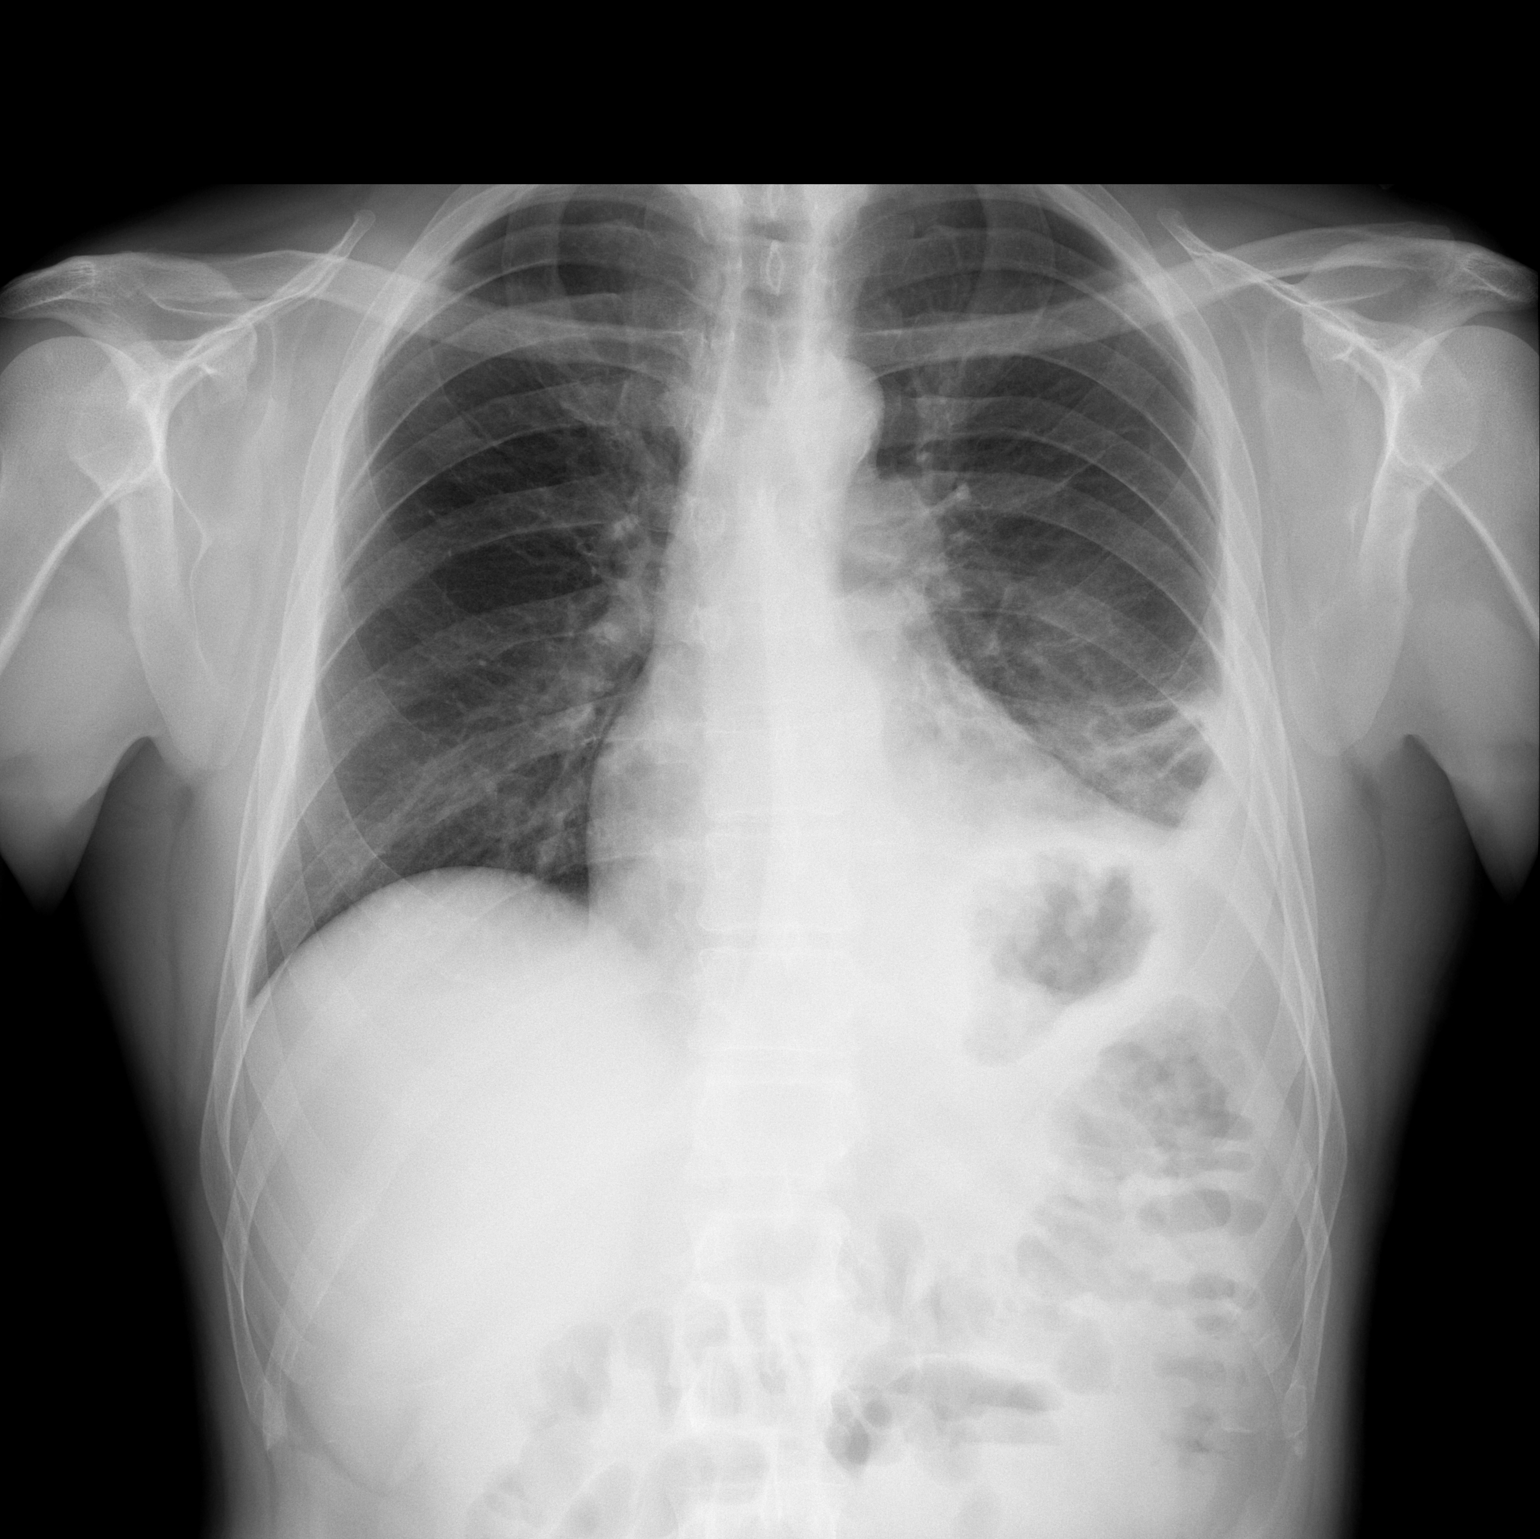

[w chest lat]
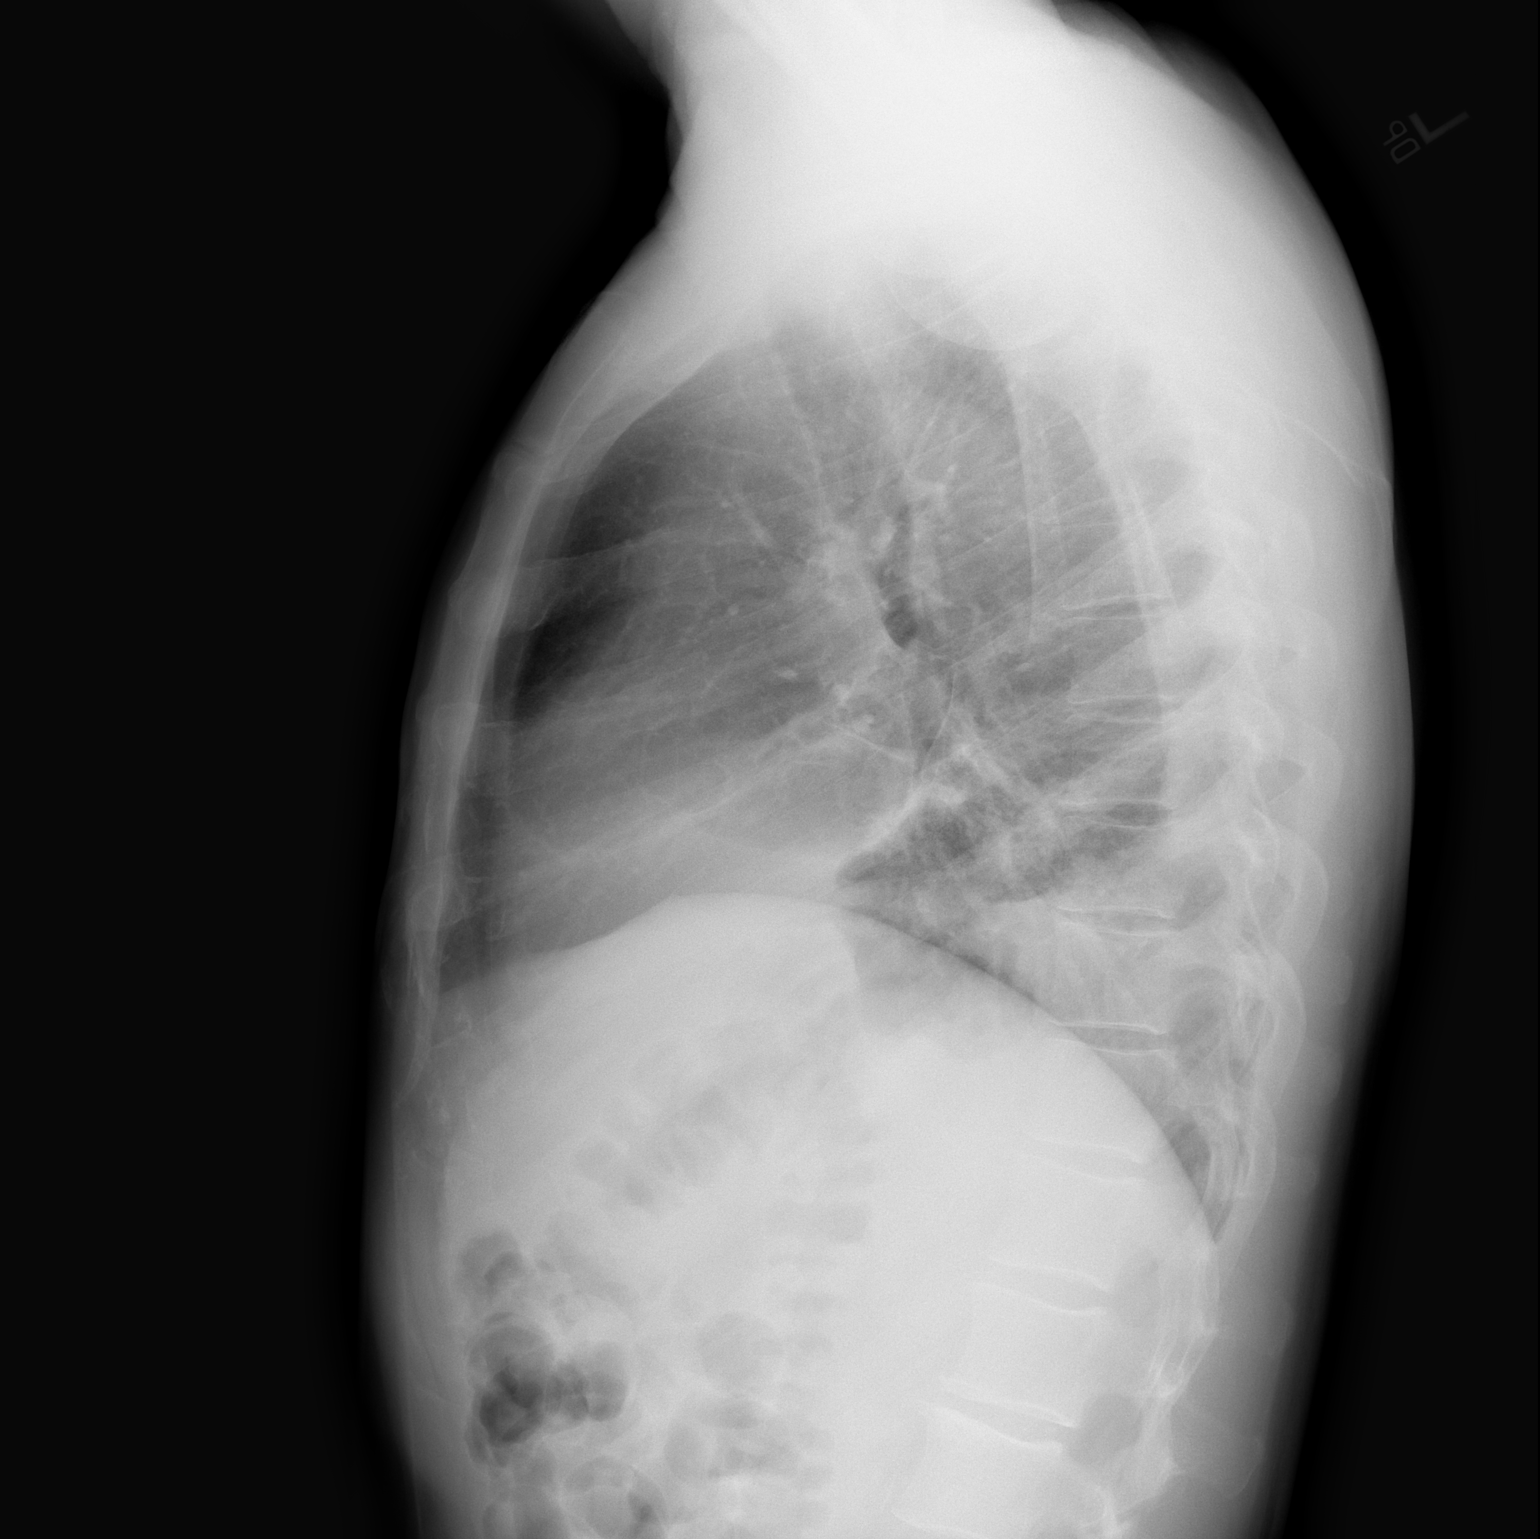

[2 of 2 positions shown; findings below may reference images not displayed]

FINDINGS: Mildly increased left basilar airspace opacity. No gross change in
amount of left pleural fluid. Stable minimal diffuse peribronchial thickening.
No pneumothorax. Mild scoliosis. Interval right jugular catheter removal.

IMPRESSION

1. Mildly increased left basilar atelectasis.
2. Stable small left pleural effusion.
3. Stable minimal bronchitic changes.

## 2005-07-21 ENCOUNTER — Ambulatory Visit: Payer: Self-pay | Admitting: Pulmonary Disease

## 2005-08-03 ENCOUNTER — Encounter: Admission: RE | Admit: 2005-08-03 | Discharge: 2005-08-03 | Payer: Self-pay | Admitting: Thoracic Surgery

## 2005-08-03 IMAGING — CR DG CHEST 2V
2 series · 2 of 2 positions shown · non-contrast
Comparison: [DATE].

CLINICAL DATA: Status post left lung biopsy. 
 CHEST - 2 VIEW:

[w chest pa]
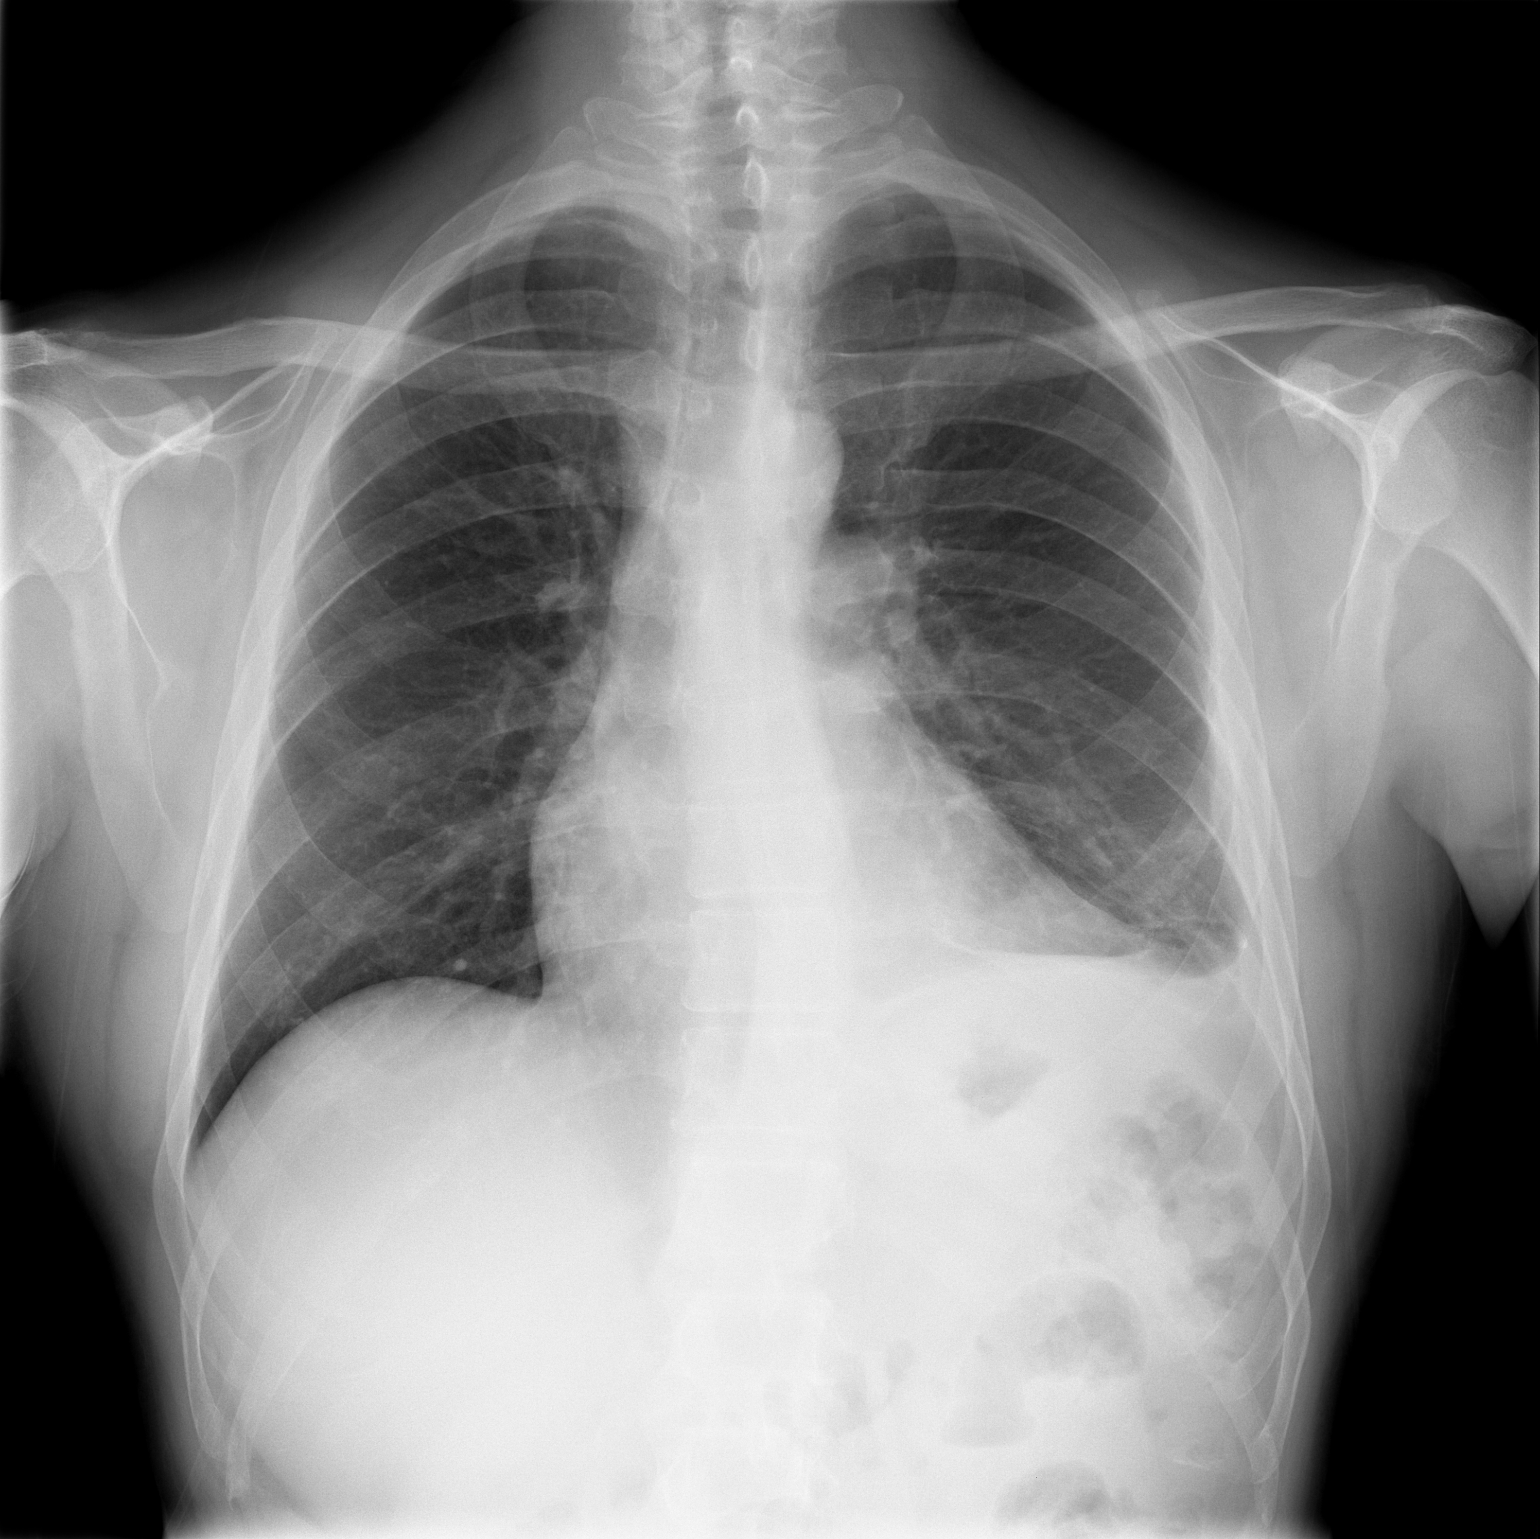

[w chest lat]
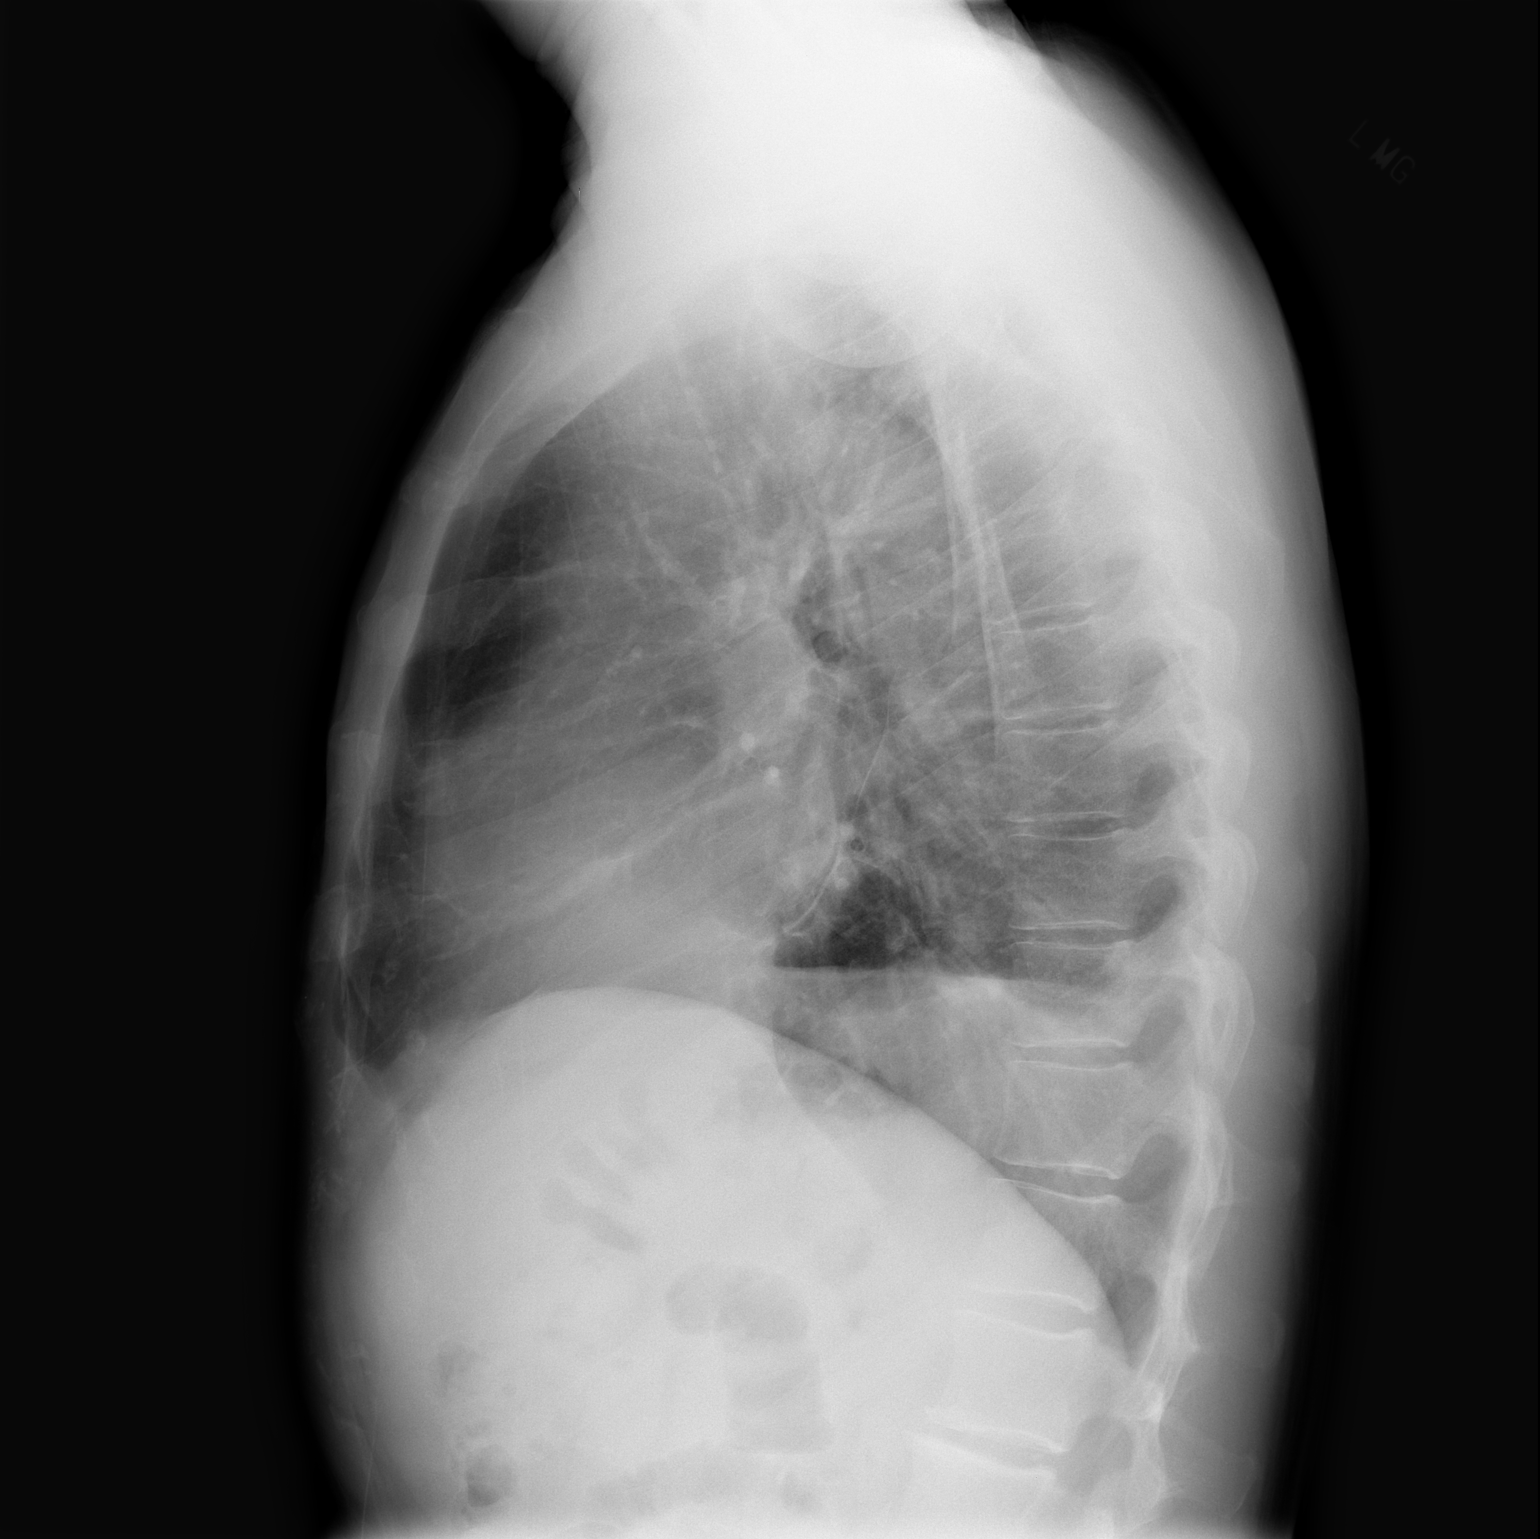

[2 of 2 positions shown; findings below may reference images not displayed]

FINDINGS: Heart size is within normal limits.  Right lung is clear.  Persistent left basilar scarring from surgery, although there has been improvement.
IMPRESSION: Satisfactory postoperative chest x-ray with still some residual left lower lobe scarring.

## 2005-08-22 ENCOUNTER — Ambulatory Visit: Payer: Self-pay | Admitting: Pulmonary Disease

## 2006-04-25 ENCOUNTER — Ambulatory Visit (HOSPITAL_COMMUNITY): Admission: RE | Admit: 2006-04-25 | Discharge: 2006-04-25 | Payer: Self-pay | Admitting: *Deleted

## 2006-04-25 ENCOUNTER — Encounter (INDEPENDENT_AMBULATORY_CARE_PROVIDER_SITE_OTHER): Payer: Self-pay | Admitting: Specialist

## 2007-08-20 ENCOUNTER — Ambulatory Visit (HOSPITAL_COMMUNITY): Admission: RE | Admit: 2007-08-20 | Discharge: 2007-08-20 | Payer: Self-pay | Admitting: *Deleted

## 2007-08-20 ENCOUNTER — Encounter (INDEPENDENT_AMBULATORY_CARE_PROVIDER_SITE_OTHER): Payer: Self-pay | Admitting: *Deleted

## 2007-10-19 ENCOUNTER — Encounter: Admission: RE | Admit: 2007-10-19 | Discharge: 2007-10-19 | Payer: Self-pay | Admitting: Endocrinology

## 2007-10-19 IMAGING — US US SCROTUM
1 series · 14 of 25 positions shown · non-contrast
Comparison: None.

CLINICAL DATA: Palpable scrotal pea-sized finding.
SCROTAL ULTRASOUND:
TECHNIQUE: Complete ultrasound examination of the testicles, epididymis, and other scrotal structures was performed.

[Series 1: us scrotum · 0.12mm/px · 14 of 41 slices shown]
[im 1/41]
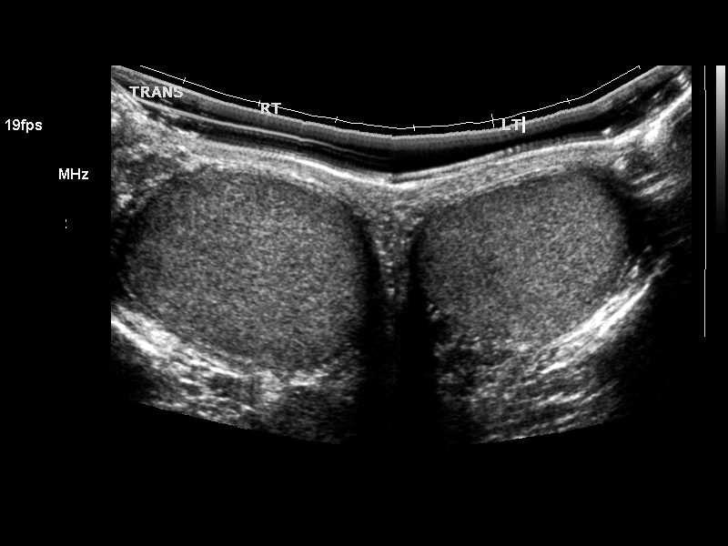
[im 4/41]
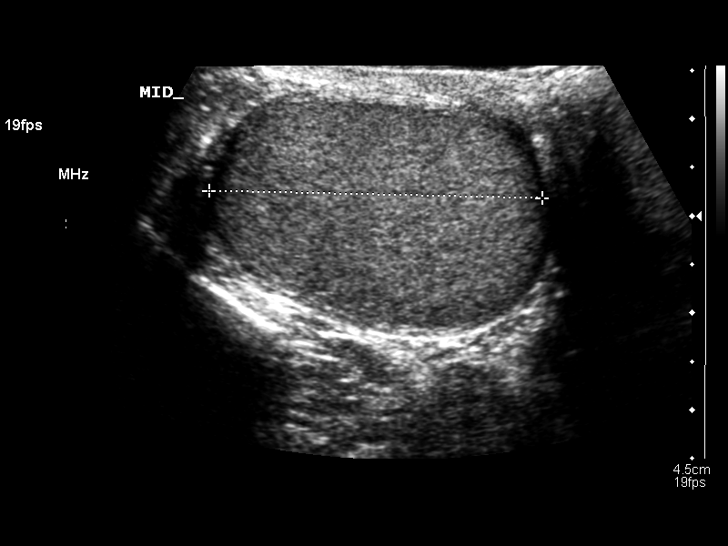
[im 7/41]
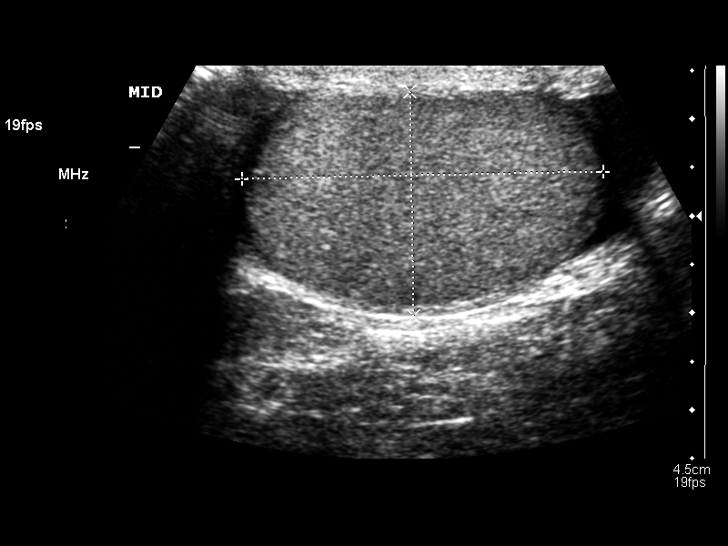
[im 11/41]
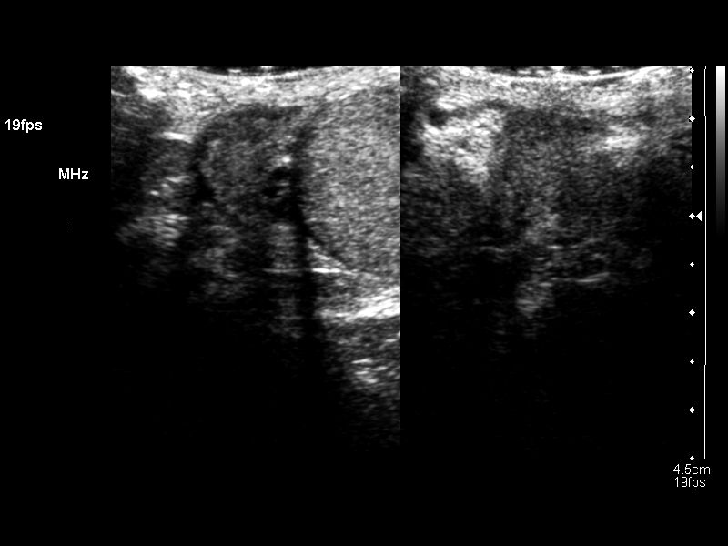
[im 14/41]
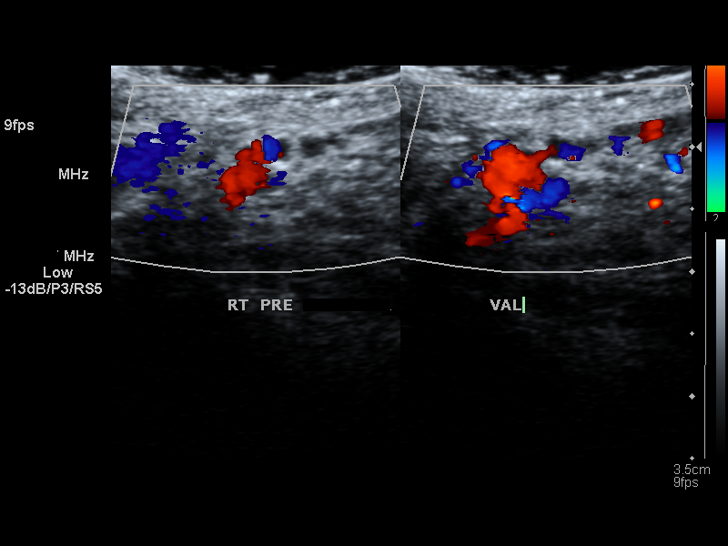
[im 16/41]
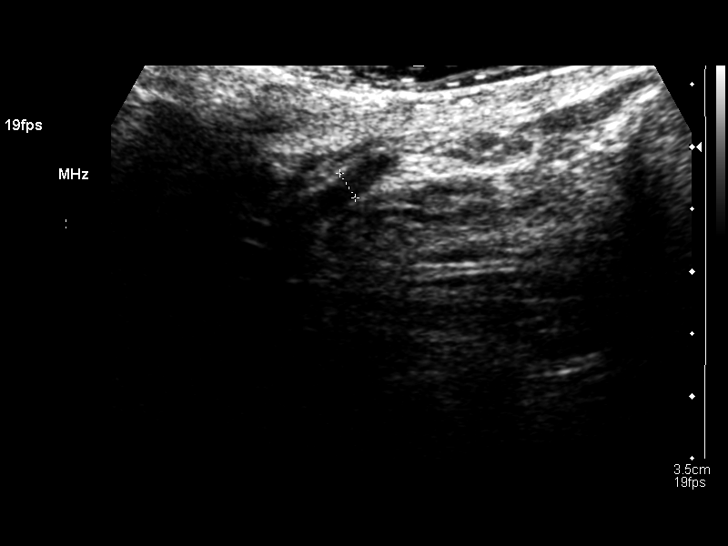
[im 19/41]
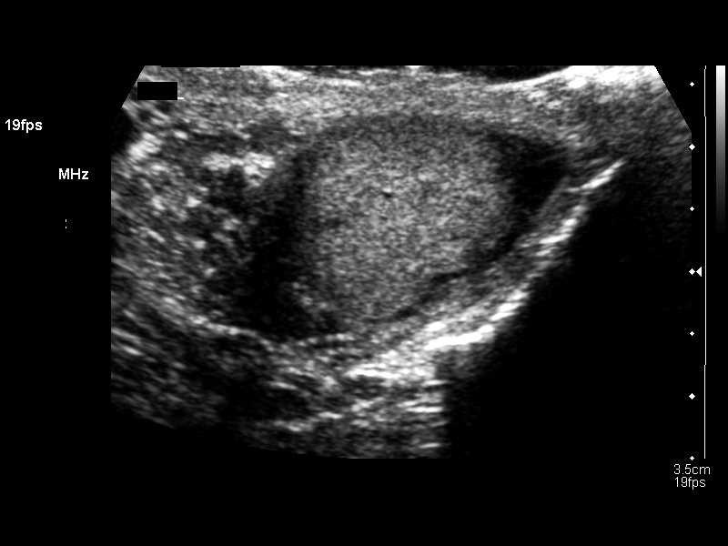
[im 22/41]
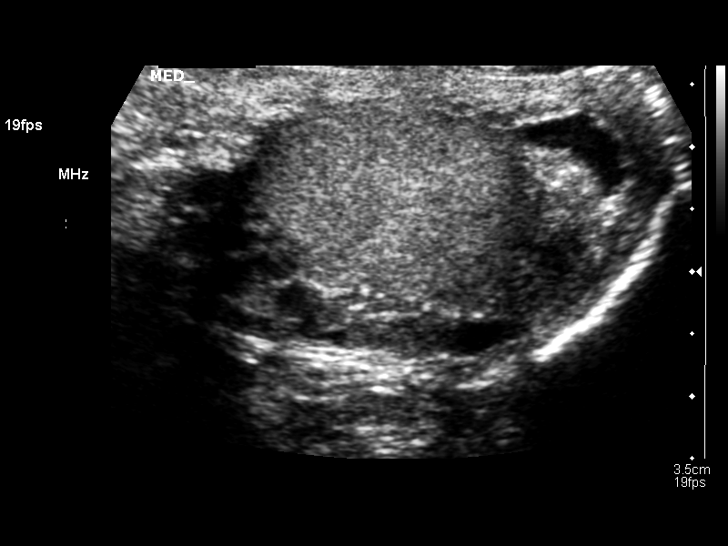
[im 26/41]
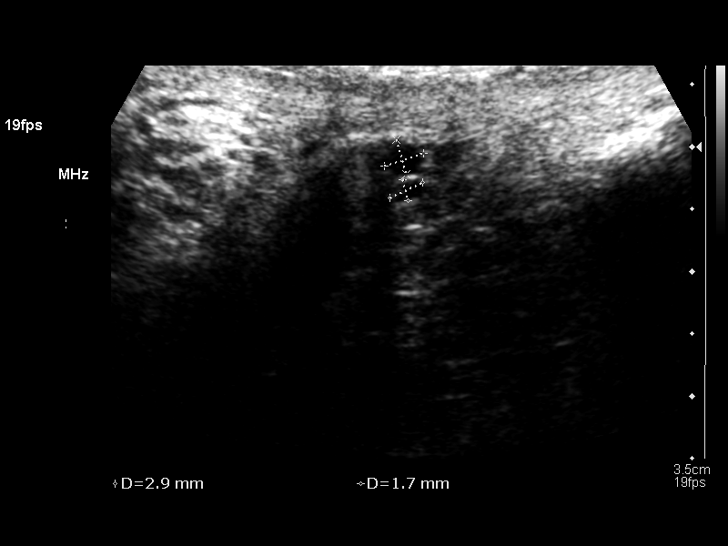
[im 27/41]
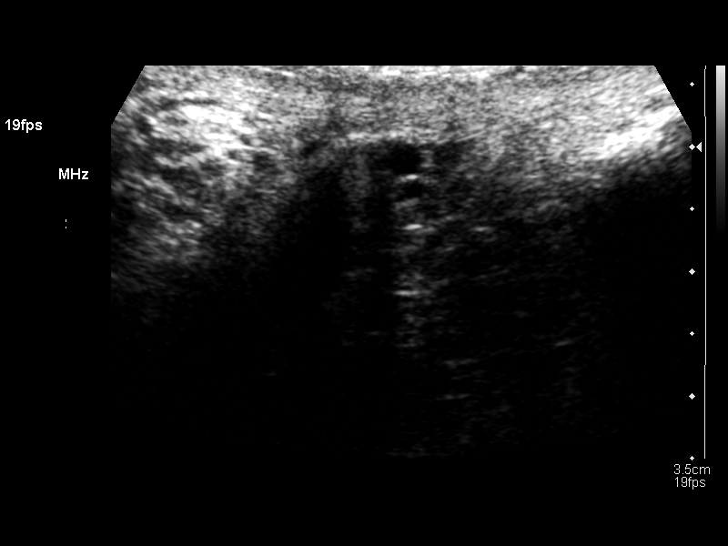
[im 31/41]
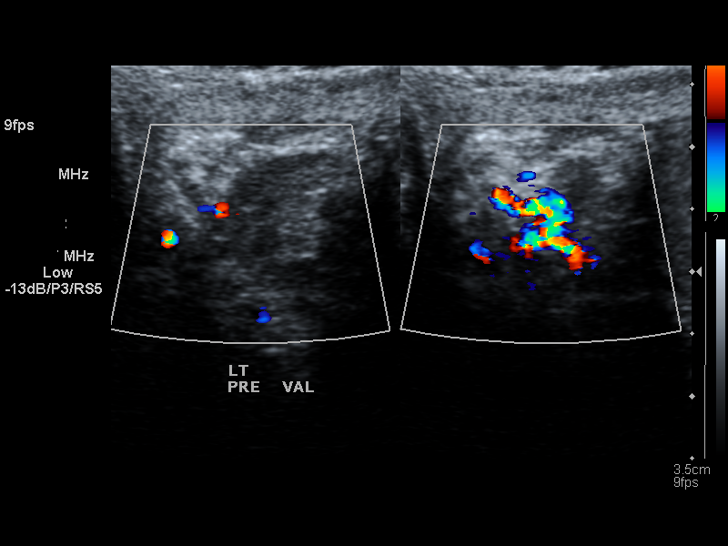
[im 34/41]
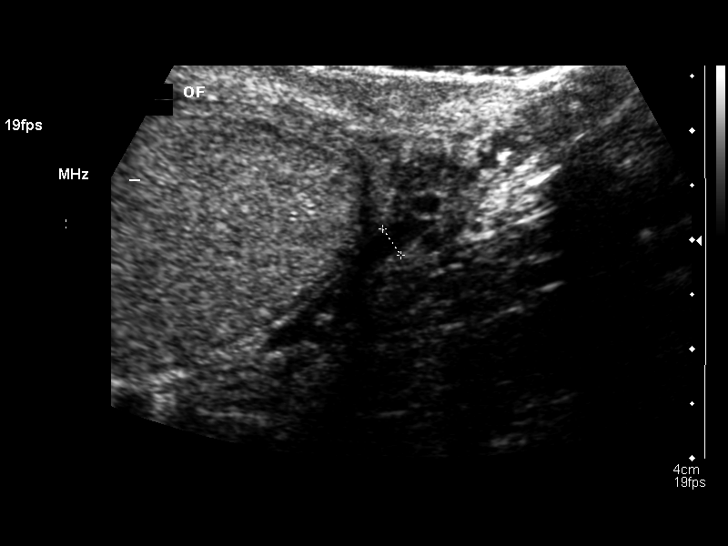
[im 37/41]
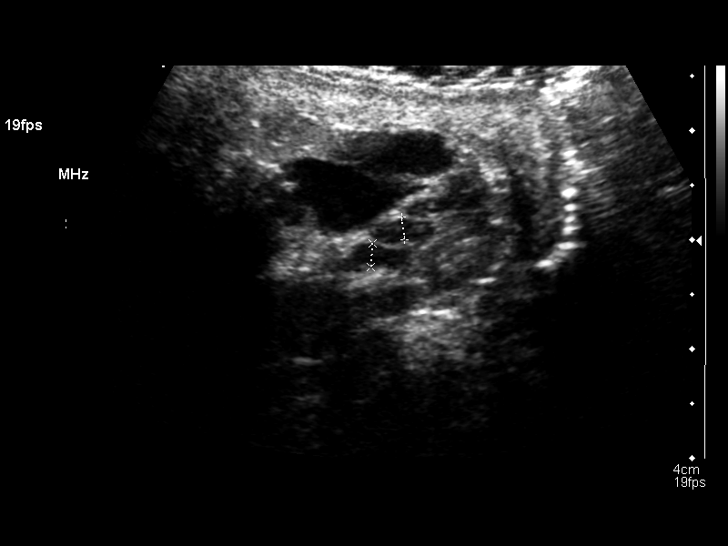
[im 41/41]
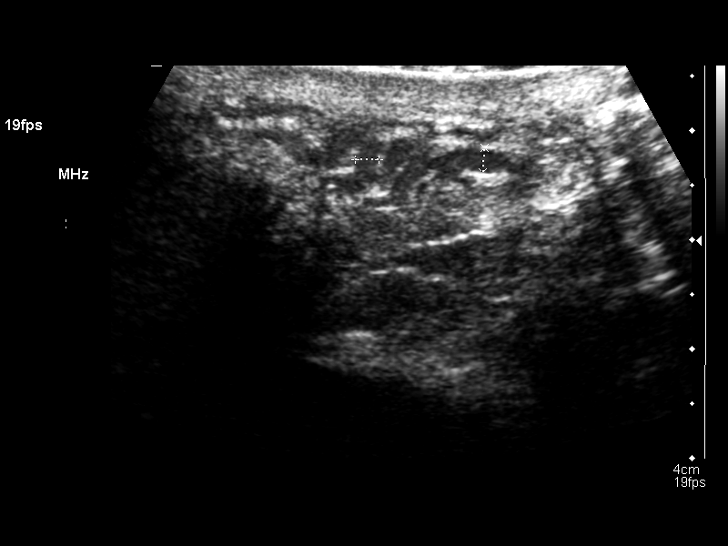

[14 of 25 positions shown; findings below may reference images not displayed]

FINDINGS: Bilateral testes are sonographically normal with right measuring 3.7 cm long by 2.3 cm AP by 3.4 cm wide and left 3.3 cm long by 2 cm AP by 2.9 cm wide.  Right epididymal head appears normal.  Left epididymal head demonstrates two small 3 mm cysts/spermatoceles.  Region of palpable finding as indicated by patient probably relates to normal vascularity of the inferior left scrotum. No hydroceles nor varicoceles are seen with inferior left scrotal vasculature in region of palpable concern per patient measuring 2 mm and less.
IMPRESSION: 1.  Palpable findings at inferior left scrotum probably relates to normal size vascularity.
2.  Two incidental 3 mm left epididymal head spermatoceles/cysts.
3.  Otherwise no significant abnormality.

## 2011-03-08 NOTE — Op Note (Signed)
NAME:  Carlos Erickson, Carlos Erickson NO.:  [id number]   MEDICAL RECORD NO.:  [id number]          PATIENT TYPE:  AMB   LOCATION:  ENDO                         FACILITY:  [facility]   PHYSICIAN:  Georgiana Spinner, M.D.    DATE OF BIRTH:  20-Dec-1950   DATE OF PROCEDURE:  08/20/2007  DATE OF DISCHARGE:                               OPERATIVE REPORT   PROCEDURE:  Upper endoscopy.   INDICATIONS:  GERD, abdominal pain.   ANESTHESIA:  Fentanyl 75 mcg, Versed 7 mg.   PROCEDURE:  With the patient mildly sedated in the left lateral  decubitus position, the Pentax videoscopic endoscope was inserted in the  mouth, passed under direct vision through the esophagus, which appeared  normal on first view with a hiatal hernia.  We advanced through this  into the stomach.  Fundus, body, antrum, duodenal bulb, second portion  of the duodenum were visualized.  From this point the endoscope was  slowly withdrawn taking circumferential views of the duodenal mucosa  until the endoscope had been pulled back into the stomach, placed in  retroflexion to view the stomach from below.  The endoscope was  straightened and withdrawn taking circumferential views of the remaining  gastric and esophageal mucosa, stopping in the distal esophagus where  changes consistent with Barrett's esophagus were photographed and  biopsied.  The patient's vital signs and pulse oximetry remained stable.  The patient tolerated the procedure well without apparent complications.   FINDINGS:  Question of Barrett's esophagus above a hiatal hernia.   Await biopsy report.  The patient will call me for results and follow up  with me as an outpatient.  Proceed to colonoscopy.           ______________________________  Georgiana Spinner, M.D.     GMO/MEDQ  D:  08/20/2007  T:  08/20/2007  Job:  [id number]

## 2011-03-08 NOTE — Op Note (Signed)
NAME:  Carlos Erickson, ODEA NO.:  [id number]   MEDICAL RECORD NO.:  [id number]          PATIENT TYPE:  AMB   LOCATION:  ENDO                         [facility]ACILITY:  [facility]   PHYSICIAN:  Georgiana Spinner, M.D.    DATE OF BIRTH:  Mar 05, 1951   DATE OF PROCEDURE:  08/20/2007  DATE OF DISCHARGE:                               OPERATIVE REPORT   PROCEDURE:  Colonoscopy.   INDICATIONS:  Diarrhea, colon cancer screening.   ANESTHESIA:  Fentanyl 25 mcg, Versed 3 mg.   PROCEDURE:  With the patient mildly sedated in the left lateral  decubitus position, the Pentax videoscopic colonoscope was inserted in  the rectum after normal rectal exam and passed under direct vision to  the cecum with pressure applied.  To the cecum was identified by  ileocecal valve and appendiceal orifice, both which were photographed.  From this point the colonoscope was slowly withdrawn taking  circumferential views of colonic mucosa stopping to take random biopsies  along the way because of diarrhea which the patient said improved on  align. We withdrew all the way to the rectum which appeared normal on  direct showed hemorrhoids on retroflexed view, the endoscope was  straightened and withdrawn.  The patient's vital signs, pulse oximeter  remained stable.  The patient tolerated procedure well without apparent  complications.   FINDINGS:  Internal hemorrhoids.  Occasional diverticulum of the sigmoid  colon, otherwise unremarkable exam.   PLAN:  Await biopsy report.  The patient will call me for results and  follow-up with me as an outpatient.           ______________________________  Georgiana Spinner, M.D.     GMO/MEDQ  D:  08/20/2007  T:  08/20/2007  Job:  [id number]

## 2011-03-11 NOTE — Op Note (Signed)
NAME:  Carlos Erickson, Carlos Erickson NO.:  [id number]   MEDICAL RECORD NO.:  [id number]          PATIENT TYPE:  AMB   LOCATION:  ENDO                         FACILITY:  [facility]   PHYSICIAN:  Georgiana Spinner, M.D.    DATE OF BIRTH:  Jun 17, 1951   DATE OF PROCEDURE:  08/31/2004  DATE OF DISCHARGE:                                 OPERATIVE REPORT   PROCEDURE PERFORMED:  Upper endoscopy with biopsy.   ENDOSCOPIST:  Georgiana Spinner, M.D.   INDICATIONS FOR PROCEDURE:  Gastroesophageal reflux disease.   ANESTHESIA:  Demerol 70 mg, Versed 8 mg.   DESCRIPTION OF PROCEDURE:  With the patient mildly sedated in the left  lateral decubitus position, the Olympus videoscopic endoscope was inserted  in the mouth and passed under direct vision through the esophagus which  appeared normal until we reached the distal esophagus and there was clear  evidence of Barrett's esophagus above a hiatal hernia, photographed and  multiple biopsies of these areas taken.  We entered into the stomach.  The  fundus, body, antrum, duodenal bulb and second portion of the duodenum were  visualized.  From this point, the endoscope was slowly withdrawn taking  circumferential views of the duodenal mucosa until the endoscope was pulled  back into the stomach and placed on retroflexion to view the stomach from  below.  The endoscope was then straightened and withdrawn taking  circumferential views of the remaining gastric and esophageal mucosa.  The  patient's vital signs and pulse oximeter remained stable.  The patient  tolerated the procedure well without apparent complications.   FINDINGS:  Barrett's esophagus above a hiatal hernia, biopsied.  Await  biopsy report.  The patient will call me for results and follow up with me  as an outpatient.       GMO/MEDQ  D:  08/31/2004  T:  08/31/2004  Job:  [id number]

## 2011-03-11 NOTE — H&P (Signed)
NAME:  Carlos Erickson, Carlos Erickson NO.:  [id number]   MEDICAL RECORD NO.:  [id number]          PATIENT TYPE:  INP   LOCATION:  NA                           FACILITY:  [facility]   PHYSICIAN:  Ines Bloomer, M.D. DATE OF BIRTH:  [birth year]   DATE OF ADMISSION:  DATE OF DISCHARGE:                                HISTORY & PHYSICAL   CHIEF COMPLAINT:  Progressive dyspnea.   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  He was seen by Dr. Kriste Basque.  He had also been treated for fairly severe acid  reflux by Dr. Virginia Rochester with Barrett's esophagitis.  Because of increasing dyspnea  and mild cough he had a chest x-ray and CT scan.  The CT scan showed  bilateral lower lobe interstitial disease.  Cardiopulmonary stress test  showed a low normal exercise tolerance.  His 2-D echo and Cardiolite were  negative with no evidence of ischemia.  Ejection fraction is 60%.  His  pulmonary function tests showed mild restriction with some improvement on  Advair.  He is referred for a __________biopsy to determine the cause of his  progressive dyspnea.  This CT scan showed progression of this over 6-8  months.   PAST MEDICAL HISTORY:  1.  Hypertension.  2.  Hypercholesterolemia.   ALLERGIES:  He has no allergies.   MEDICATIONS:  1.  Alprazolam 0.5 mg daily.  2.  Atenolol/hydrochlorothiazide 50/25 mg daily.  3.  Flomax 0.4 daily.  4.  AcipHex 20 mg daily.  5.  Advair Diskus 250/50 2 puffs daily.  6.  Reglan twice a day.   FAMILY HISTORY:  Negative for cancer and vascular disease.   SOCIAL HISTORY:  He is married, has one child.  He is not retired.  He  smokes half a pack of cigarettes a day and does not drink alcohol on a  regular basis.   REVIEW OF SYSTEMS:  His weight has been stable.  CARDIAC:  He has no angina  or atrial fibrillation.  PULMONARY:  No hemoptysis or excessive sputum,  hence in the history of present illness.  GASTROINTESTINAL:  He has reflux  but no nausea, vomiting, or constipation.  VASCULAR:  No claudication, DVT,  TIAs.  GENITOURINARY:  No dysuria, but he has had frequent urinations.  No  kidney disease.  ORTHOPEDIC:  __________Flomax.  NEUROLOGIC:  No headaches  or seizures.  PSYCHIATRIC:  No psychiatric illnesses.  HEMATOLOGIC:  No  history of anemia.  EENT:  No changes in eyesight or hearing.   PHYSICAL EXAMINATION:  GENERAL:  He is a well-developed but thin Caucasian  male in no acute distress.  VITAL SIGNS:  His blood pressure was 140/80, pulse 80, respirations 18,  saturations were 90%.  HEENT:  Head is atraumatic.  Eyes:  Pupils equal, reactive to light and  accommodation.  Extraocular movements are normal.  Ears:  Tympanic membranes  intact.  Nose:  No septal deviation.  Throat is without lesions.  NECK:  Supple with  no thyromegaly.  No carotid bruits.  CHEST:  Bilateral wheezes.  HEART:  Regular sinus rhythm, no murmurs.  ABDOMEN:  Soft.  There is no hepatosplenomegaly.  Bowel sounds are normal.  EXTREMITIES:  There is no clubbing or edema.  Pulses are 2+, femoral.  NEUROLOGIC:  He is oriented x3.  Cranial nerves II-XII are intact.  Sensory  and motor intact.  SKIN:  Without lesions.   IMPRESSION:  1.  Progressive dyspnea, probable interstitial lung disease.  2.  Hypertension.  3.  Hypercholesterolemia.  4.  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   PLAN:  Left VATS and lung biopsy x3.           ______________________________  Ines Bloomer, M.D.     DPB/MEDQ  D:  07/09/2005  T:  07/09/2005  Job:  [id number]

## 2011-03-11 NOTE — Op Note (Signed)
NAME:  Carlos Erickson, Carlos Erickson NO.:  [id number]   MEDICAL RECORD NO.:  [id number]          PATIENT TYPE:  OUT   LOCATION:  CARD                         FACILITY:  [facility]   PHYSICIAN:  Oley Balm. Sung Amabile, M.D. [facility] OF BIRTH:  03-07-1951   DATE OF PROCEDURE:  02/10/2005  DATE OF DISCHARGE:  02/10/2005                                 OPERATIVE REPORT   PROCEDURE:  Cardiopulmonary stress test.   INDICATIONS:  Exertional dyspnea.   DESCRIPTION OF PROCEDURE:  Cardiopulmonary stress testing was performed on a  graded treadmill.  Testing was stopped due to dyspnea.  Effort was maximal.  At peak exercise, oxygen uptake was 2.03 L per minute or 83% of predicted  maximum, indicating low normal exercise tolerance.  However, his exercise  tolerance corrected to normal for body weight (he is thin).   At peak exercise, heart rate was 145 beats per minute or 87% of predicted  maximum indicating that cardiovascular limitation was reached.  Oxygen pulse  was normal suggesting normal left ventricular function.  Blood pressure  response was normal.  EKG tracings revealed inferolateral ST-T wave changes  at rest suggestive of ischemia or injury.  There appeared to be  pseudonormalization during exercise.  However, he had a normal heart rate  recovery rate which argues against active ischemia.   At peak exercise, minute ventilation was 93 L per minute or 82% of maximum  voluntary ventilation indicating that ventilatory limitation was reached.  Gas exchange parameters revealed no abnormalities.  Baseline pulmonary  function testing revealed mild restriction, no obstruction and a mild  decrease in diffusion capacity.  Post exercise spirometry was not done.   SUMMARY:  Low normal exercise tolerance with simultaneous cardiovascular and  ventilatory limitations.  EKG abnormalities noted as described above.  The  baseline abnormalities suggest ischemic heart disease.  However, the patient  did  not complain of any chest discomfort during exercise and his heart rate  recovery time was normal.  Has this patient undergone an ischemia  evaluation?  Mild restriction noted on pulmonary function tests without  significant change since October 2005.      DBS/MEDQ  D:  02/17/2005  T:  02/17/2005  Job:  [id number]   cc:   Lonzo Cloud. Kriste Basque, M.D. [facility]

## 2011-03-11 NOTE — Op Note (Signed)
   NAME:  Carlos Erickson, Carlos Erickson                        ACCOUNT NO.:  [id number]   MEDICAL RECORD NO.:  [id number]                   PATIENT TYPE:  AMB   LOCATION:  ENDO                                 FACILITY:  [facility]   PHYSICIAN:  Georgiana Spinner, M.D.                 DATE OF BIRTH:  1951/06/11   DATE OF PROCEDURE:  DATE OF DISCHARGE:                                 OPERATIVE REPORT   PROCEDURE:  Colonoscopy with biopsy.   INDICATIONS FOR PROCEDURE:  Colon polyp.   ANESTHESIA:  Demerol 10, Versed 1 mg.   DESCRIPTION OF PROCEDURE:  With the patient mildly sedated in the left  lateral decubitus position, the Olympus videoscopic colonoscope was inserted  in the rectum and after a normal rectal exam passed under direct vision  easily to the cecum identified by the ileocecal valve and appendiceal  orifice. From this point, the colonoscope was slowly withdrawn taking  circumferential views of the entire colonic mucosa stopping at approximately  20 to 10 cm from the anal verge at which point two small polyps were seen,  photographed and removed using hot biopsy forceps technique setting of 20:20  blended current. The tissue was retrieved. The endoscope was then withdrawn  all the way to the rectum which appeared normal in direct view other than a  small hemorrhoid and showed hemorrhoids on retroflexed view. The endoscope  was straightened and withdrawn. The patient's vital signs and pulse oximeter  remained stable. The patient tolerated the procedure well without apparent  complications.   FINDINGS:  Two small polyps at approximately 15 cm from the anal verge,  internal hemorrhoids, otherwise unremarkable examination.   PLAN:  Await biopsy report. The patient will call me for results and  followup with me as an outpatient.                                               Georgiana Spinner, M.D.    GMO/MEDQ  D:  06/16/2003  T:  06/16/2003  Job:  [id number]

## 2011-03-11 NOTE — Op Note (Signed)
NAME:  Carlos Erickson, Carlos Erickson NO.:  [id number]   MEDICAL RECORD NO.:  [id number]          PATIENT TYPE:  AMB   LOCATION:  ENDO                         FACILITY:  [facility]   PHYSICIAN:  Georgiana Spinner, M.D.    DATE OF BIRTH:  1951-02-18   DATE OF PROCEDURE:  04/25/2006  DATE OF DISCHARGE:                                 OPERATIVE REPORT   PROCEDURE PERFORMED:  Upper endoscopy with biopsy.   INDICATIONS FOR PROCEDURE:  Barrett's esophagus.   ANESTHESIA:  Fentanyl 40 mg, Versed 4 mg.   DESCRIPTION OF PROCEDURE:  With the patient mildly sedated in the left  lateral decubitus position, the Olympus video endoscope was inserted in the  mouth and passed under direct vision through the esophagus which appeared  normal until we reached the distal esophagus and above the hiatal hernia was  noted Barrett's esophagus.  This was photographed and multiple biopsies were  taken in all four quadrants of the esophagus.  We then entered into the  stomach.  Fundus, body, antrum, duodenal bulb and second portion of the  duodenum were visualized.  From this point the endoscope was slowly  withdrawn, taking circumferential views of the duodenal mucosa until the  endoscope had been pulled back in the stomach, placed on retroflexion to  view the stomach from below and fundic gland polyp was seen, photographed  and biopsied.  The endoscope was then straightened and withdrawn.  Patient's  vital signs and pulse oximeter remained stable.  The patient tolerated the  procedure well without apparent complications.   FINDINGS:  Barrett's esophagus above the hiatal hernia.  Fundic gland polyp  biopsied.  Await biopsy report.  The patient will call me for results and  follow up with me as an outpatient.           ______________________________  Georgiana Spinner, M.D.     GMO/MEDQ  D:  04/25/2006  T:  04/25/2006  Job:  [id number]

## 2011-03-11 NOTE — Op Note (Signed)
Carlos Erickson, VARADY NO.:  [id number]   MEDICAL RECORD NO.:  [id number]          PATIENT TYPE:  INP   LOCATION:  2857                         FACILITY:  [facility]   PHYSICIAN:  Ines Bloomer, M.D. DATE OF BIRTH:  02/09/1951   DATE OF PROCEDURE:  07/11/2005  DATE OF DISCHARGE:                                 OPERATIVE REPORT   PREOPERATIVE DIAGNOSIS:  Bilateral pulmonary infiltrates.   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   OPERATION PERFORMED:  Left video assisted thoracoscopic surgery, lung biopsy  times three.   SURGEON:  Ines Bloomer, M.D.   ASSISTANT:  Concha Se, RN,FA   ANESTHESIA:  General.   DESCRIPTION OF PROCEDURE:  After general anesthesia, the left chest was  prepped and draped in the usual sterile fashion.  A dual lumen tube was  inserted.  Percutaneous insertion of all monitoring lines was done.  Three  trocar sites were made, one in the anterior axillary line at the sixth  intercostal space, one in the posterior axillary line at the seventh  intercostal space and one in the midaxillary line at the fifth intercostal  space.  Three trocars were inserted.  A 30 degree scope was inserted through  the anterior trocar site and the lingula was grasped with a Kaiser ring  forceps and then bringing the EZ-45 stapler and the TSB-45 stapler  posteriorly.  The tip of the lingula was resected with two applications of  the stapler.  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  Then the medial basilar  segment was grasped and entered again coming in from grasping it medially  from the anterior trocar site and then coming from the posterior trocar  site.  Another large portion of the lung was resected and sent for permanent  section. Then going posteriorly, a third area was grasped on the posterior  basilar segment  and resected with three applications of the EZ-45 stapler  and the TSB-45 stapler.  CoSeal was applied to the staple lines.  The On-Q  catheter was placed subpleurally under direct vision for the scope in the  usual fashion.  Marcaine block was done in the usual fashion.  Two chest  tubes were placed and tied in place with 0 silk through two trocar sites.  The third trocar site was closed with 3-0 Vicryl in subcutaneous tissue and  3-0 Vicryl in subcuticular, Dermabond for the skin.  The patient was  returned to the recovery room in stable condition.           ______________________________  Ines Bloomer, M.D.     DPB/MEDQ  D:  07/11/2005  T:  07/11/2005  Job:  [id number]

## 2011-03-11 NOTE — Discharge Summary (Signed)
NAMEHARM, JOU NO.:  [id number]   MEDICAL RECORD NO.:  [id number]          PATIENT TYPE:  INP   LOCATION:  3315                         FACILITY:  [facility]   PHYSICIAN:  Ines Bloomer, M.D. DATE OF BIRTH:  [birth year]   DATE OF ADMISSION:  07/11/2005  DATE OF DISCHARGE:                                 DISCHARGE SUMMARY   PRIMARY DIAGNOSIS:  Bilateral pulmonary infiltrates (pathology report  pending).   SECONDARY DIAGNOSES:  1.  Hypertension.  2.  Hyperlipidemia.   ALLERGIES:  No known drug allergies.   OPERATION/PROCEDURE:  Left video thoracoscopic surgery with lung biopsy x3.   HISTORY OF PRESENT ILLNESS AND HOSPITAL COURSE:  Mr. 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  He was seen by Dr. Kriste Basque.  He had  also been treated for fairly severe acid reflux by Dr. Virginia Rochester  and Barrett's  esophagitis.  Because of increasing dyspnea and mild cough, he had a chest x-  ray and CT scan.  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  Cardiopulmonary stress test was done and showed a low normal exercise  tolerance.  The patient also had a 2-D echocardiogram done and Cardiolite  which were negative but no evidence of ischemia.  Ejection fraction showed  60%.  His pulmonary function tests showed mild restriction with some  improvement on Advair.  The patient was referred to Dr. Edwyna Shell for biopsy to  determine the cause of his progressive dyspnea.   Dictation stopped here.      Theda Belfast, PA    ______________________________  Ines Bloomer, M.D.    KMD/MEDQ  D:  07/13/2005  T:  07/14/2005  Job:  [id number]

## 2011-03-11 NOTE — Op Note (Signed)
   NAME:  Carlos Erickson, Carlos Erickson                        ACCOUNT NO.:  [id number]   MEDICAL RECORD NO.:  [id number]                   PATIENT TYPE:  AMB   LOCATION:  ENDO                                 [facility]ACILITY:  [facility]   PHYSICIAN:  Georgiana Spinner, M.D.                 DATE OF BIRTH:  1951-09-23   DATE OF PROCEDURE:  DATE OF DISCHARGE:                                 OPERATIVE REPORT   PROCEDURE:  Upper endoscopy.   INDICATIONS FOR PROCEDURE:  Gastroesophageal reflux disease.   ANESTHESIA:  Demerol 70, Versed 7 mg.   DESCRIPTION OF PROCEDURE:  With the patient mildly sedated in the left  lateral decubitus position, the Olympus videoscopic endoscope was inserted  in the mouth and passed under direct vision through the esophagus which  appeared normal until we reached the distal esophagus and there appeared to  be areas of Barrett's esophagus photographed and biopsied. We entered into  the stomach. The fundus, body, antrum, duodenal bulb and second portion of  the duodenum appeared normal. From this point, the endoscope was slowly  withdrawn taking circumferential views of the entire duodenal mucosa  visualized until the endoscope was then pulled back in the stomach, placed  in retroflexion to view the stomach from below and a loose wrap of the GE  junction around the endoscope was seen and we could in fact see the  esophagus in retroflexed view. The endoscope was then straightened and  withdrawn taking circumferential views of the remaining gastric and  esophageal mucosa. The patient's vital signs and pulse oximeter remained  stable. The patient tolerated the procedure well without apparent  complications.   FINDINGS:  What appears to be Barrett's esophagus above a loose wrap of the  GE junction.   PLAN:  Await biopsy report. The patient will call me for results and  followup with me as an outpatient. Proceed to colonoscopy.                                               Georgiana Spinner, M.D.    GMO/MEDQ  D:  06/16/2003  T:  06/16/2003  Job:  [id number]

## 2011-03-11 NOTE — Discharge Summary (Signed)
NAME:  Carlos Erickson, Carlos Erickson NO.:  [id number]   MEDICAL RECORD NO.:  [id number]          PATIENT TYPE:  INP   LOCATION:  3315                         FACILITY:  [facility]   PHYSICIAN:  Ines Bloomer, M.D. DATE OF BIRTH:  Jan 11, 1951   DATE OF ADMISSION:  07/11/2005  DATE OF DISCHARGE:  07/16/2005                                 DISCHARGE SUMMARY   HISTORY OF PRESENT ILLNESS:  The patient is a 60 year old male who was  referred to Dr. Edwyna Shell for a lung biopsy. The patient apparently has a high  level of exposure to toxic fumes at his work place and recently has  developed increased dyspnea. He has seen Dr. Kriste Basque. Multiple studies have  been obtained including a CT scan and chest x-ray. The CT scan showed  bilateral lower lobe interstitial disease. He also underwent some  cardiopulmonary testing and stress test showed no evidence of ischemia and  echocardiogram showed 60% ejection fraction. Pulmonary function studies  showed some mild restrictive deficits but improved with Advair. He was  referred to Dr. Edwyna Shell for further evaluation to include a lung biopsy due  to the progressive nature of his dyspnea.   PAST MEDICAL HISTORY:  1.  Hypertension.  2.  Hypercholesterolemia.   ALLERGIES:  No known drug allergies.   ADMISSION MEDICATIONS:  1.  Alprazolam 0.5 mg daily.  2.  Atenolol/hydrochlorothiazide 50/25 once daily.  3.  Flomax 0.4 mg daily.  4.  Aciphex 20 mg daily.  5.  Advair Diskus 250/50 2 puffs daily.  6.  Reglan twice daily.   FAMILY HISTORY/SOCIAL HISTORY/REVIEW OF SYSTEMS/PHYSICAL EXAMINATION:  Please see history and physical done on admission.   HOSPITAL COURSE:  The patient was admitted electively and on July 11, 2005, taken to the operating room where he underwent a left video assisted  thoracoscopic surgery with lung biopsy x3. The patient tolerated the  procedure well and was taken to the post anesthesia care unit in stable  condition.   POSTOPERATIVE HOSPITAL COURSE:  The patient has done well postoperatively.  All routine lines, monitors, and drainage devices have been discontinued in  the standard fashion. He did have some postoperative paranoia and psychosis  that required a multiple medication regimen and psychiatric consultation  with Dr. Jeanie Sewer. This has improved and the medications are in the weaning  pattern. The patient's pathology has returned and it was sent to Dr. Erich Montane, at the [facility] in [place] and her  opinion agreed with the pathology opinion of the [facility].  Essentially, the findings were consistent with hypersensitivity pneumonia.   CONDITION ON DISCHARGE:  Overall, the patient's status is felt to be stable  for tentative discharge in the morning of July 16, 2005.   DISCHARGE MEDICATIONS:  1.  Advair Diskus 250/50 2 puffs daily.  2.  Flomax 0.4 mg daily.  3.  Reglan 10 mg two times daily.  4.  Aciphex 20 mg daily.  5.  Atenolol/hydrochlorothiazide 50/25 mg daily.  6.  Alprazolam 0.5 mg daily.  7.  Seroquel 100 mg q.h.s. for  7 nights.  8.  Ativan 0.5 mg q. 8 hours p.r.n.  9.  Tylox 1 or 2 every 4 to 6 hours as needed for pain.   INSTRUCTIONS:  The patient was read instructions regarding medications,  activity, diet, and wound care.   FOLLOW UP:  1.  With Dr. Edwyna Shell on July 20, 2005 at 1:30 p.m. with a chest x-ray.  2.  He is to followup with his primary physicians, with pulmonology and      gastroenterology as they request.   ADDENDUM:  The patient does have also a history of Barrett's esophagitis  that has been evaluated by Dr. Virginia Rochester, that I failed to mention previously.   FINAL DIAGNOSIS:  Hypersensitivity pneumonia, uncertain etiology, that will  require further testing as an outpatient for best treatment plan.   OTHER DIAGNOSES:  1.  Barrett's esophagitis.  2.  History of hypertension.  3.  History of hypercholesterolemia.   4.  History of benign prostatic hypertrophy, presumed, as he is on Flomax.      Rowe Clack, P.A.-C.    ______________________________  Ines Bloomer, M.D.    Sherryll Burger  D:  07/15/2005  T:  07/17/2005  Job:  [id number]   cc:   Lonzo Cloud. Kriste Basque, M.D. [facility]  [address]  [place]  [place] [address]   Ines Bloomer, M.D.  Fax: [phone]   Georgiana Spinner, M.D.  Fax: [phone]

## 2013-05-20 ENCOUNTER — Ambulatory Visit (INDEPENDENT_AMBULATORY_CARE_PROVIDER_SITE_OTHER): Payer: No Typology Code available for payment source | Admitting: Family Medicine

## 2013-05-20 ENCOUNTER — Encounter: Payer: Self-pay | Admitting: Family Medicine

## 2013-05-20 VITALS — BP 120/74 | HR 57 | Temp 98.1°F | Ht 68.0 in | Wt 182.0 lb

## 2013-05-20 DIAGNOSIS — Z125 Encounter for screening for malignant neoplasm of prostate: Secondary | ICD-10-CM

## 2013-05-20 DIAGNOSIS — Z79899 Other long term (current) drug therapy: Secondary | ICD-10-CM

## 2013-05-20 DIAGNOSIS — M129 Arthropathy, unspecified: Secondary | ICD-10-CM

## 2013-05-20 DIAGNOSIS — Z13220 Encounter for screening for lipoid disorders: Secondary | ICD-10-CM

## 2013-05-20 DIAGNOSIS — K219 Gastro-esophageal reflux disease without esophagitis: Secondary | ICD-10-CM

## 2013-05-20 DIAGNOSIS — M1990 Unspecified osteoarthritis, unspecified site: Secondary | ICD-10-CM

## 2013-05-20 DIAGNOSIS — I10 Essential (primary) hypertension: Secondary | ICD-10-CM

## 2013-05-20 MED ORDER — ATENOLOL-CHLORTHALIDONE 100-25 MG PO TABS: 1.0000 | ORAL_TABLET | Freq: Every day | ORAL | Status: DC

## 2013-05-20 NOTE — Patient Instructions (Addendum)
Labwork appointment in next week or so at his convenience  F/u full CPX later in the fall

## 2013-05-20 NOTE — Progress Notes (Signed)
[facility] [address] [place] [address] Phone: [phone] Fax: [phone]  Date:  05/20/2013   Name:  Carlos Erickson   DOB:  Mar 12, 1951   MRN:  [id number] Gender: male Age: 62 y.o.  Primary Physician:  Hannah Beat, MD  Evaluating MD: Hannah Beat, MD   Chief Complaint: Establish Care   History of Present Illness:  Carlos Erickson is a 62 y.o. pleasant patient who presents with the following:  Has been going to see Dr. Adela Lank. Rare ETOH Cigars  HTN: Tolerating all medications without side effects Stable and at goal No CP, no sob. No HA.  BP Readings from Last 3 Encounters:  05/20/13 120/74   Also needs CPX    There are no active problems to display for this patient.   Past Medical History  Diagnosis Date  . Arthritis   . Blood in stool   . History of chicken pox   . GERD (gastroesophageal reflux disease)   . Hypertension   . Colon polyps     Past Surgical History  Procedure Laterality Date  . Eye surgery    . Lung biopsy    . Hemmorids      History   Social History  . Marital Status: Married    Spouse Name: N/A    Number of Children: N/A  . Years of Education: N/A   Occupational History  . Not on file.   Social History Main Topics  . Smoking status: Current Every Day Smoker    Types: Cigars  . Smokeless tobacco: Never Used  . Alcohol Use: Yes  . Drug Use: No  . Sexually Active: Not on file   Other Topics Concern  . Not on file   Social History Narrative  . No narrative on file    No family history on file.  Allergies  Allergen Reactions  . Morphine And Related Other (See Comments)    hallucanations    Medication list has been reviewed and updated.  No outpatient prescriptions prior to visit.   No facility-administered medications prior to visit.    Review of Systems:   GEN: No acute illnesses, no fevers, chills. GI: No n/v/d, eating normally Pulm: No  SOB Interactive and getting along well at home.  Otherwise, ROS is as per the HPI.   Physical Examination: BP 120/74  Pulse 57  Temp(Src) 98.1 F (36.7 C) (Oral)  Ht 5\' 8"  (1.727 m)  Wt 182 lb (82.555 kg)  BMI 27.68 kg/m2  SpO2 97%  Ideal Body Weight: Weight in (lb) to have BMI = 25: 164.1   GEN: WDWN, NAD, Non-toxic, A & O x 3 HEENT: Atraumatic, Normocephalic. Neck supple. No masses, No LAD. Ears and Nose: No external deformity. CV: RRR, No M/G/R. No JVD. No thrill. No extra heart sounds. PULM: CTA B, no wheezes, crackles, rhonchi. No retractions. No resp. distress. No accessory muscle use. EXTR: No c/c/e NEURO Normal gait.  PSYCH: Normally interactive. Conversant. Not depressed or anxious appearing.  Calm demeanor.    Assessment and Plan:  Special screening for malignant neoplasm of prostate - Plan: PSA  Encounter for long-term (current) use of other medications - Plan: Basic metabolic panel, CBC with Differential, Hepatic function panel  Screening for lipoid disorders - Plan: Lipid panel  Hypertension  GERD (gastroesophageal reflux disease)  Arthritis  Essentially, all probs stable. Refill HTN meds, f/u cpx  Orders Today:  Orders Placed This Encounter  Procedures  .  Basic metabolic panel    Standing Status: Future     Number of Occurrences:      Standing Expiration Date: 05/20/2014  . CBC with Differential    Standing Status: Future     Number of Occurrences:      Standing Expiration Date: 05/20/2014  . Hepatic function panel    Standing Status: Future     Number of Occurrences:      Standing Expiration Date: 05/20/2014  . Lipid panel    Standing Status: Future     Number of Occurrences:      Standing Expiration Date: 05/20/2014  . PSA    Standing Status: Future     Number of Occurrences:      Standing Expiration Date: 08/20/2013    Updated Medication List: (Includes new medications, updates to list, dose adjustments) Meds ordered this encounter   Medications  . DISCONTD: atenolol-chlorthalidone (TENORETIC) 100-25 MG per tablet    Sig: Take 1 tablet by mouth daily.  . Misc Natural Products (PROSTATE HEALTH) CAPS    Sig: Take by mouth.  . lansoprazole (PREVACID) 15 MG capsule    Sig: Take 15 mg by mouth daily.  Marland Kitchen atenolol-chlorthalidone (TENORETIC) 100-25 MG per tablet    Sig: Take 1 tablet by mouth daily.    Dispense:  90 tablet    Refill:  3    Medications Discontinued: Medications Discontinued During This Encounter  Medication Reason  . atenolol-chlorthalidone (TENORETIC) 100-25 MG per tablet Reorder      Signed, Karleen Hampshire T. Roberts Bon, MD 05/20/2013 2:04 PM

## 2013-05-21 ENCOUNTER — Encounter: Payer: Self-pay | Admitting: Family Medicine

## 2013-05-21 DIAGNOSIS — I10 Essential (primary) hypertension: Secondary | ICD-10-CM | POA: Insufficient documentation

## 2013-05-21 DIAGNOSIS — K219 Gastro-esophageal reflux disease without esophagitis: Secondary | ICD-10-CM | POA: Insufficient documentation

## 2013-05-21 DIAGNOSIS — M1990 Unspecified osteoarthritis, unspecified site: Secondary | ICD-10-CM | POA: Insufficient documentation

## 2013-05-22 ENCOUNTER — Other Ambulatory Visit (INDEPENDENT_AMBULATORY_CARE_PROVIDER_SITE_OTHER): Payer: No Typology Code available for payment source

## 2013-05-22 DIAGNOSIS — Z13220 Encounter for screening for lipoid disorders: Secondary | ICD-10-CM

## 2013-05-22 DIAGNOSIS — Z79899 Other long term (current) drug therapy: Secondary | ICD-10-CM

## 2013-05-22 DIAGNOSIS — Z125 Encounter for screening for malignant neoplasm of prostate: Secondary | ICD-10-CM

## 2013-05-22 LAB — BASIC METABOLIC PANEL
BUN: 23 mg/dL (ref 6–23)
Calcium: 9.6 mg/dL (ref 8.4–10.5)
Creatinine, Ser: 1.1 mg/dL (ref 0.4–1.5)
GFR: 74.9 mL/min (ref >60.00)
Glucose, Bld: 103 mg/dL — ABNORMAL HIGH (ref 70–99)
Potassium: 3.3 mEq/L — ABNORMAL LOW (ref 3.5–5.1)

## 2013-05-22 LAB — CBC WITH DIFFERENTIAL/PLATELET
Basophils Absolute: 0.1 10*3/uL (ref 0.0–0.1)
Eosinophils Relative: 4.6 % (ref 0.0–5.0)
HCT: 48 % (ref 39.0–52.0)
Lymphocytes Relative: 36.5 % (ref 12.0–46.0)
Monocytes Relative: 11.3 % (ref 3.0–12.0)
Neutrophils Relative %: 46.8 % (ref 43.0–77.0)
Platelets: 240 10*3/uL (ref 150.0–400.0)
RDW: 13.5 % (ref 11.5–14.6)
WBC: 8.7 10*3/uL (ref 4.5–10.5)

## 2013-05-22 LAB — HEPATIC FUNCTION PANEL
ALT: 27 U/L (ref 0–53)
AST: 21 U/L (ref 0–37)
Bilirubin, Direct: 0.1 mg/dL (ref 0.0–0.3)
Total Bilirubin: 0.8 mg/dL (ref 0.3–1.2)

## 2013-05-22 LAB — LIPID PANEL
Total CHOL/HDL Ratio: 7
VLDL: 68.8 mg/dL — ABNORMAL HIGH (ref 0.0–40.0)

## 2013-05-22 LAB — PSA: PSA: 0.57 ng/mL (ref 0.10–4.00)

## 2013-08-29 ENCOUNTER — Other Ambulatory Visit: Payer: Self-pay

## 2014-03-05 ENCOUNTER — Ambulatory Visit (INDEPENDENT_AMBULATORY_CARE_PROVIDER_SITE_OTHER): Payer: Self-pay

## 2014-03-05 ENCOUNTER — Encounter: Payer: Self-pay | Admitting: Podiatry

## 2014-03-05 ENCOUNTER — Ambulatory Visit (INDEPENDENT_AMBULATORY_CARE_PROVIDER_SITE_OTHER): Payer: Self-pay | Admitting: Podiatry

## 2014-03-05 VITALS — BP 154/85 | HR 50 | Resp 16 | Ht 67.5 in | Wt 180.0 lb

## 2014-03-05 DIAGNOSIS — M779 Enthesopathy, unspecified: Secondary | ICD-10-CM

## 2014-03-05 DIAGNOSIS — M79609 Pain in unspecified limb: Secondary | ICD-10-CM

## 2014-03-05 LAB — CBC WITH DIFFERENTIAL/PLATELET
BASOS ABS: 0.1 10*3/uL (ref 0.0–0.1)
Basophils Relative: 1 % (ref 0–1)
EOS PCT: 4 % (ref 0–5)
Eosinophils Absolute: 0.4 10*3/uL (ref 0.0–0.7)
HEMATOCRIT: 46.7 % (ref 39.0–52.0)
HEMOGLOBIN: 17 g/dL (ref 13.0–17.0)
LYMPHS PCT: 32 % (ref 12–46)
Lymphs Abs: 2.9 10*3/uL (ref 0.7–4.0)
MCH: 31.8 pg (ref 26.0–34.0)
MCHC: 36.4 g/dL — ABNORMAL HIGH (ref 30.0–36.0)
MCV: 87.3 fL (ref 78.0–100.0)
MONO ABS: 0.8 10*3/uL (ref 0.1–1.0)
MONOS PCT: 9 % (ref 3–12)
Neutro Abs: 4.9 10*3/uL (ref 1.7–7.7)
Neutrophils Relative %: 54 % (ref 43–77)
Platelets: 283 10*3/uL (ref 150–400)
RBC: 5.35 MIL/uL (ref 4.22–5.81)
RDW: 13.7 % (ref 11.5–15.5)
WBC: 9 10*3/uL (ref 4.0–10.5)

## 2014-03-05 LAB — SEDIMENTATION RATE: Sed Rate: 25 mm/hr — ABNORMAL HIGH (ref 0–16)

## 2014-03-05 LAB — URIC ACID: Uric Acid, Serum: 7.9 mg/dL — ABNORMAL HIGH (ref 4.0–7.8)

## 2014-03-05 MED ORDER — PREDNISONE 10 MG PO KIT: PACK | ORAL | Status: DC

## 2014-03-05 NOTE — Progress Notes (Signed)
   Subjective:    Patient ID: Carlos Erickson, male    DOB: 1951/07/31, 63 y.o.   MRN: [id number]  HPI Comments: N toe pain L right 1st toe and 1st MPJ D 03/01/2014 O while driving on Saturday C redness, swelling, decreased ROM and pain A no known trigger T Aspirin, and Aleve  Toe Pain  Associated symptoms include numbness.      Review of Systems  HENT: Positive for hearing loss and tinnitus.   Respiratory: Positive for cough and shortness of breath.   Skin: Positive for rash.  Neurological: Positive for numbness.       Numbness in both 5th fingers on and off.  All other systems reviewed and are negative.      Objective:   Physical Exam Orientated x3 white male  Vascular: DP and PT pulses 2/4 bilaterally  Dermatological: Erythema, edema and warmth localized to the right first MPJ and right hallux area.  Musculoskeletal: Exquisite palpable tenderness localized to the right first MPJ and interphalangeal joint of the right hallux.  X-ray examination right foot weightbearing   Intact bony structure without fracture and/or dislocation.  AP view demonstrates increased soft tissue density medial head of first metatarsal without emphysema.  Well-organized inferior calcaneal spur  Radiographic impression:  No acute bony abnormality noted in the right foot      Assessment & Plan:   Assessment: Capsulitis right first MPJ area Probable gouty attack right foot  Plan: Patient referred to the lab for: CBC with differential Uric acid Sedimentation rate  After presenting to lab patient will begin a 6 day 10 mg prednisone Dosepak.  Reappoint x7 days

## 2014-03-05 NOTE — Patient Instructions (Signed)
Go to lab for uric acid, sedimentation rate and CBC with differential After lab begin 10 mg prednisone dose pack

## 2014-03-06 ENCOUNTER — Encounter: Payer: Self-pay | Admitting: Podiatry

## 2014-03-13 ENCOUNTER — Telehealth: Payer: Self-pay | Admitting: *Deleted

## 2014-03-13 NOTE — Telephone Encounter (Signed)
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            Confirm patient has followup visit ------

## 2014-03-13 NOTE — Telephone Encounter (Signed)
I left the patient a message that his labwork showed Gout, per Dr. Amalia Hailey.  I asked him to call and schedule a follow-up appointment with Dr. Amalia Hailey in 1 week.

## 2014-03-24 HISTORY — PX: CARDIAC CATHETERIZATION: SHX172

## 2014-05-11 ENCOUNTER — Inpatient Hospital Stay: Payer: Self-pay | Admitting: Internal Medicine

## 2014-05-11 LAB — BASIC METABOLIC PANEL
Anion Gap: 10 (ref 7–16)
BUN: 17 mg/dL (ref 7–18)
CHLORIDE: 101 mmol/L (ref 98–107)
CO2: 25 mmol/L (ref 21–32)
Calcium, Total: 8.4 mg/dL — ABNORMAL LOW (ref 8.5–10.1)
Creatinine: 1.2 mg/dL (ref 0.60–1.30)
EGFR (Non-African Amer.): >60
GLUCOSE: 137 mg/dL — AB (ref 65–99)
OSMOLALITY: 276 (ref 275–301)
Potassium: 3.3 mmol/L — ABNORMAL LOW (ref 3.5–5.1)
Sodium: 136 mmol/L (ref 136–145)

## 2014-05-11 LAB — CBC
HCT: 46 % (ref 40.0–52.0)
HGB: 15.8 g/dL (ref 13.0–18.0)
MCH: 31.6 pg (ref 26.0–34.0)
MCHC: 34.3 g/dL (ref 32.0–36.0)
MCV: 92 fL (ref 80–100)
PLATELETS: 250 10*3/uL (ref 150–440)
RBC: 5 10*6/uL (ref 4.40–5.90)
RDW: 13.4 % (ref 11.5–14.5)
WBC: 9.9 10*3/uL (ref 3.8–10.6)

## 2014-05-11 LAB — TROPONIN I
TROPONIN-I: 0.21 ng/mL — AB
Troponin-I: <0.02 ng/mL

## 2014-05-11 IMAGING — CR DG CHEST 2V
1 series · 2 of 2 positions shown · non-contrast
Comparison: [DATE]

CLINICAL DATA: Chest pain.  Shortness of breath.

EXAM:
CHEST  2 VIEW

[Series 1: w chest pa · 0.14mm/px · 2 of 2 slices shown]
[im 1/2]
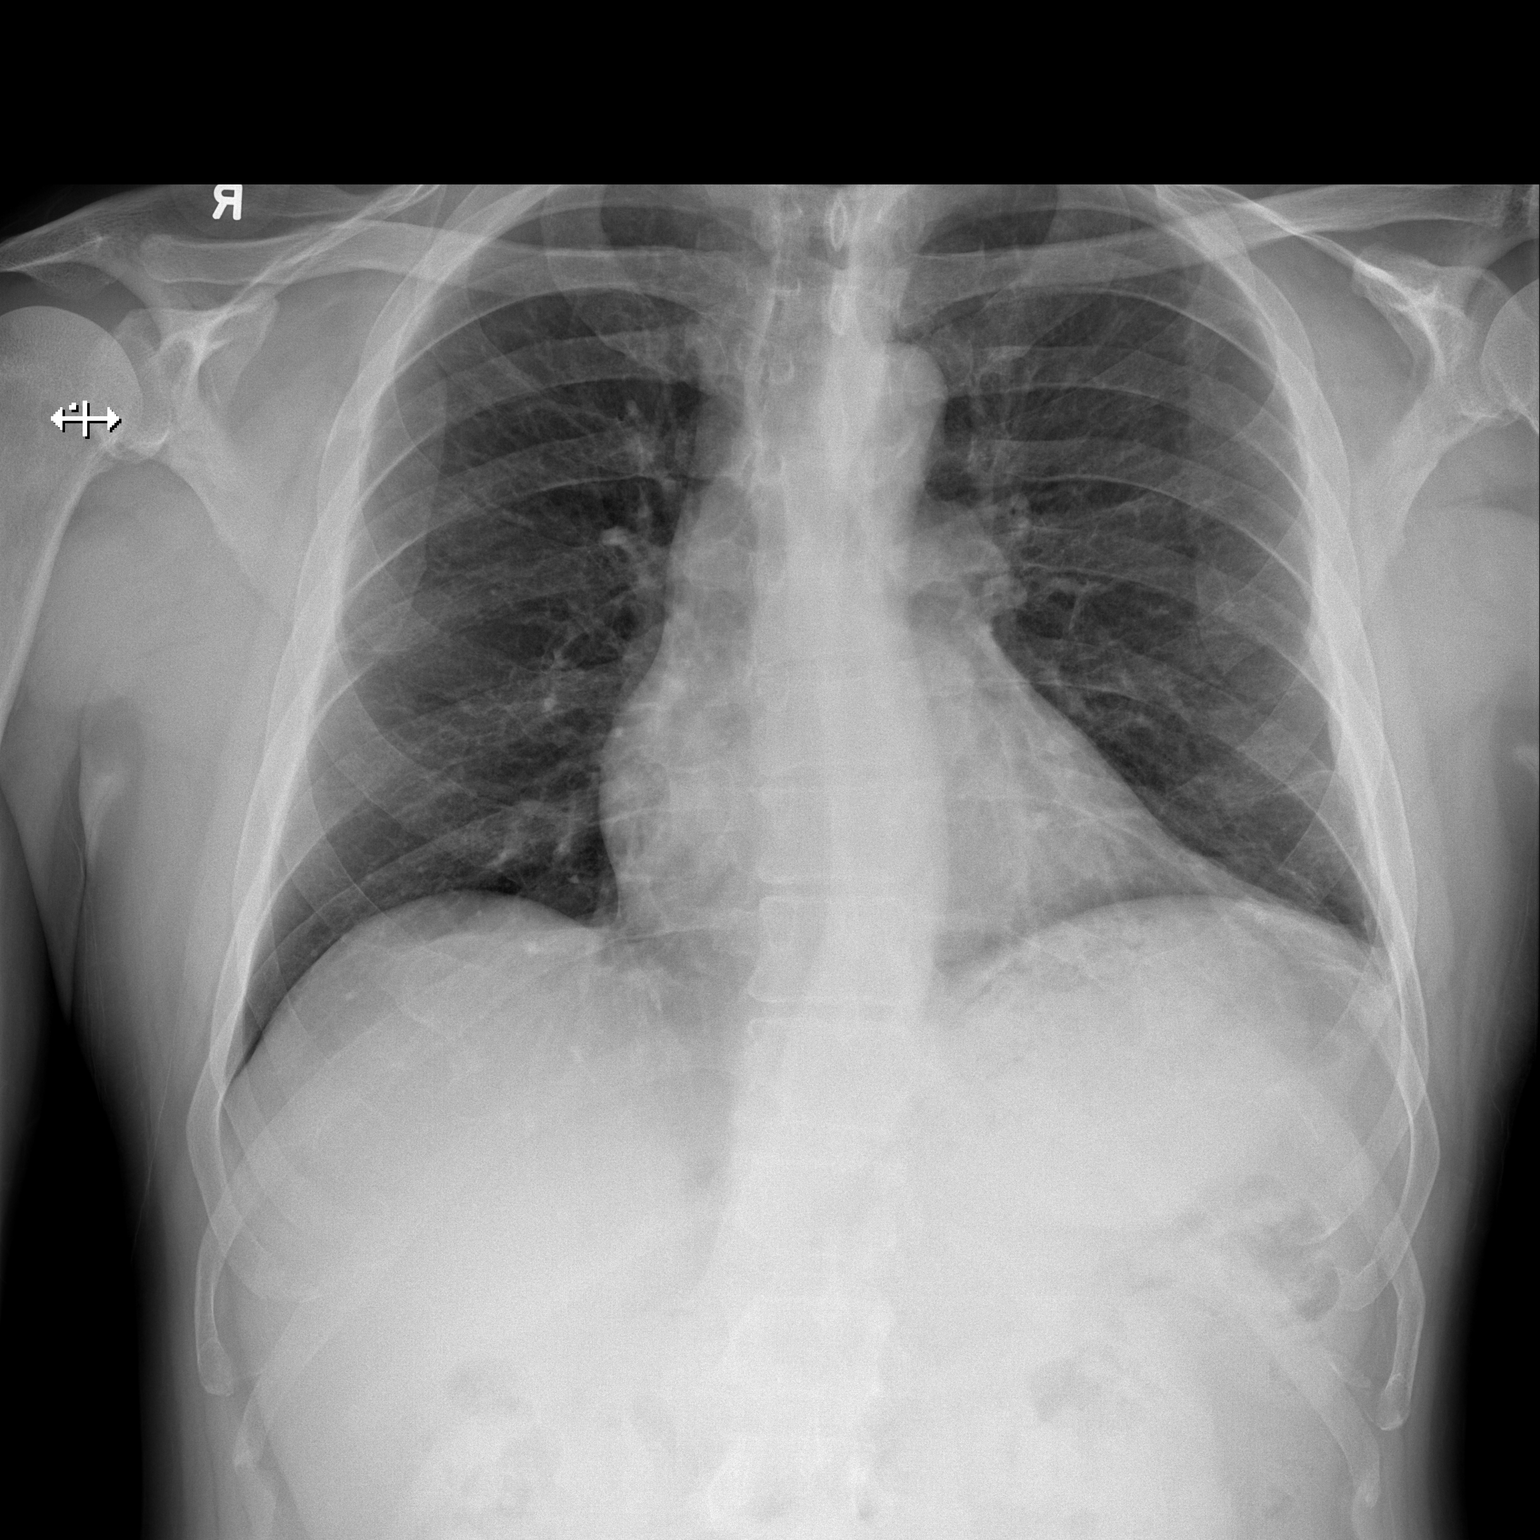
[im 2/2]
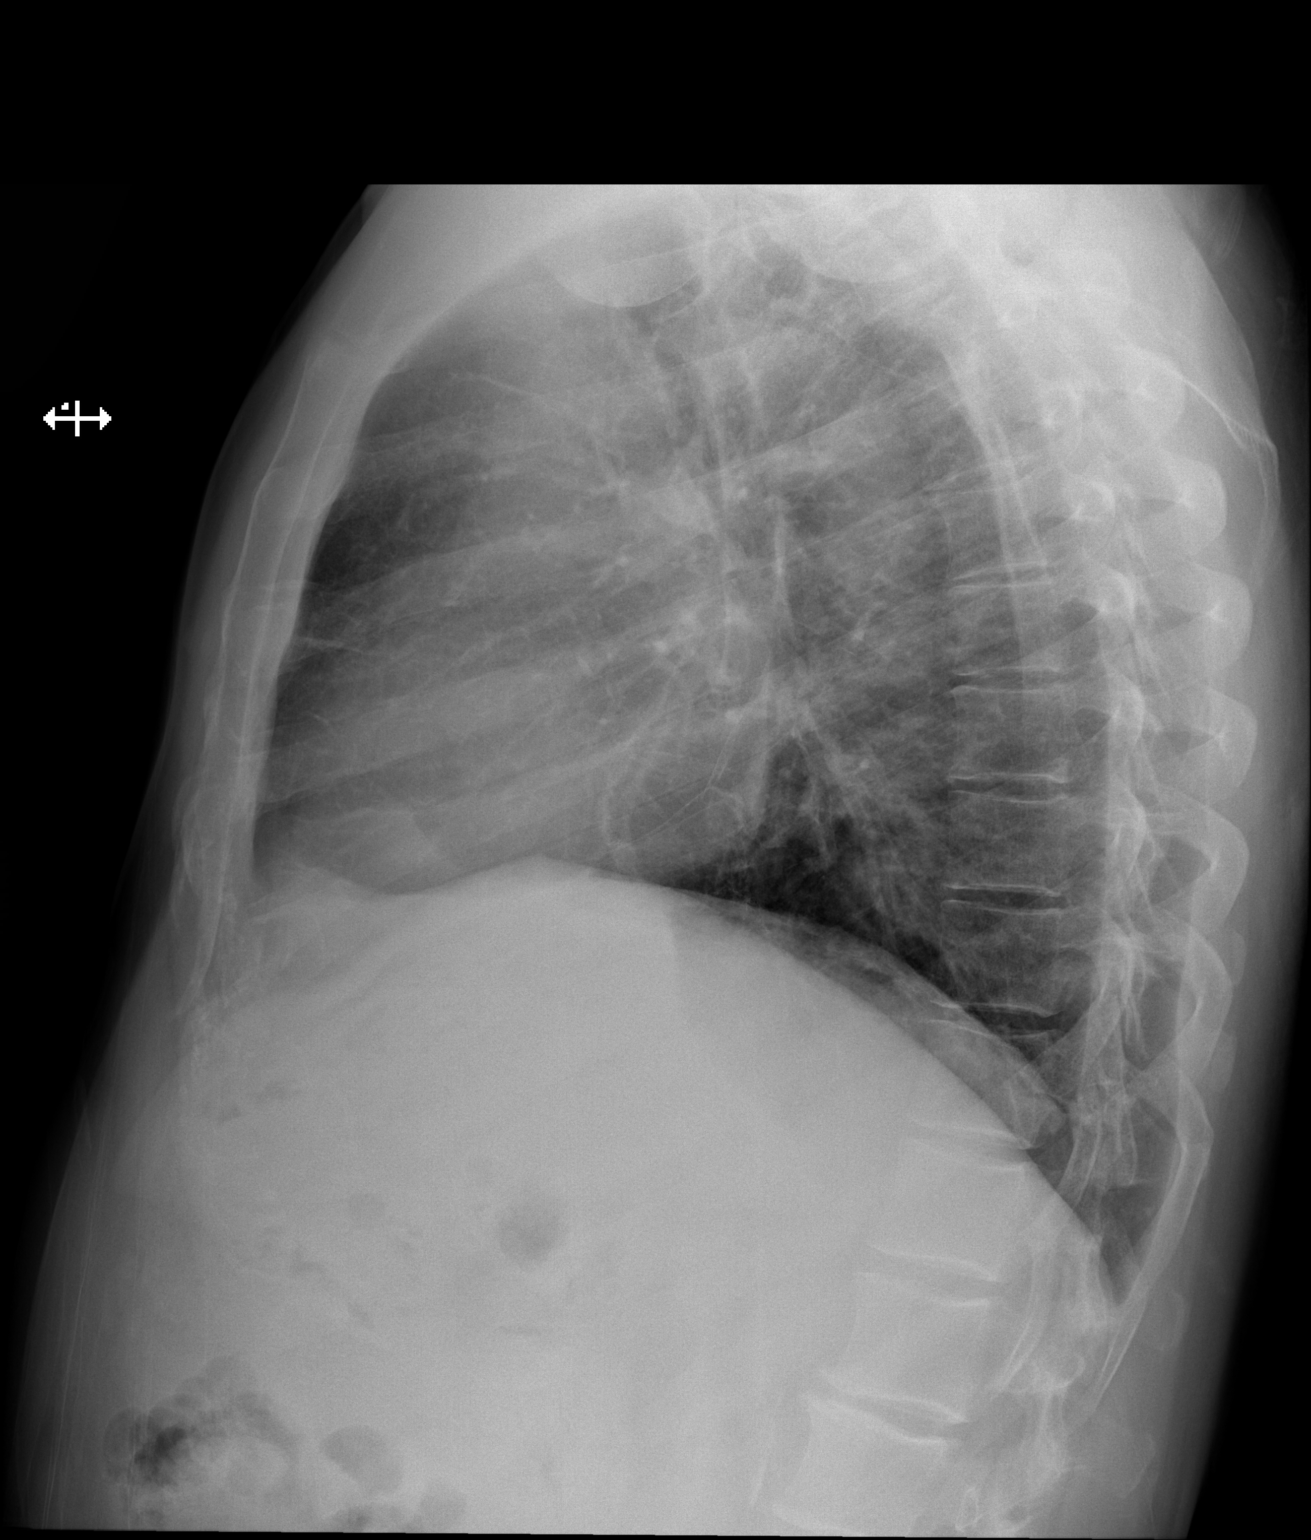

[2 of 2 positions shown; findings below may reference images not displayed]

FINDINGS: The heart size and mediastinal contours are within normal limits.
Both lungs are clear. The visualized skeletal structures are
unremarkable.
IMPRESSION: No active cardiopulmonary disease.

## 2014-05-12 DIAGNOSIS — R079 Chest pain, unspecified: Secondary | ICD-10-CM

## 2014-05-12 LAB — BASIC METABOLIC PANEL
Anion Gap: 8 (ref 7–16)
BUN: 21 mg/dL — AB (ref 7–18)
CALCIUM: 8.3 mg/dL — AB (ref 8.5–10.1)
CREATININE: 0.94 mg/dL (ref 0.60–1.30)
Chloride: 102 mmol/L (ref 98–107)
Co2: 28 mmol/L (ref 21–32)
EGFR (African American): >60
EGFR (Non-African Amer.): >60
GLUCOSE: 108 mg/dL — AB (ref 65–99)
OSMOLALITY: 279 (ref 275–301)
Potassium: 3.3 mmol/L — ABNORMAL LOW (ref 3.5–5.1)
Sodium: 138 mmol/L (ref 136–145)

## 2014-05-12 LAB — LIPID PANEL
CHOLESTEROL: 184 mg/dL (ref 0–200)
HDL: 26 mg/dL — AB (ref 40–60)
TRIGLYCERIDES: 567 mg/dL — AB (ref 0–200)

## 2014-05-12 LAB — CK TOTAL AND CKMB (NOT AT ARMC)
CK, Total: 159 U/L
CK, Total: 152 U/L
CK, Total: 124 U/L
CK-MB: 2 ng/mL (ref 0.5–3.6)
CK-MB: 2.2 ng/mL (ref 0.5–3.6)
CK-MB: 2.4 ng/mL (ref 0.5–3.6)

## 2014-05-12 LAB — TROPONIN I: Troponin-I: 0.24 ng/mL — ABNORMAL HIGH

## 2014-05-22 ENCOUNTER — Ambulatory Visit: Payer: No Typology Code available for payment source | Admitting: Family Medicine

## 2014-05-24 DIAGNOSIS — I2510 Atherosclerotic heart disease of native coronary artery without angina pectoris: Secondary | ICD-10-CM

## 2014-05-24 HISTORY — PX: CORONARY ARTERY BYPASS GRAFT: SHX141

## 2014-05-24 HISTORY — DX: Atherosclerotic heart disease of native coronary artery without angina pectoris: I25.10

## 2014-07-09 ENCOUNTER — Encounter: Payer: Self-pay | Admitting: Family Medicine

## 2014-07-09 ENCOUNTER — Ambulatory Visit (INDEPENDENT_AMBULATORY_CARE_PROVIDER_SITE_OTHER): Payer: No Typology Code available for payment source | Admitting: Family Medicine

## 2014-07-09 VITALS — BP 140/82 | HR 60 | Temp 98.0°F | Ht 68.0 in | Wt 175.5 lb

## 2014-07-09 DIAGNOSIS — I2510 Atherosclerotic heart disease of native coronary artery without angina pectoris: Secondary | ICD-10-CM | POA: Insufficient documentation

## 2014-07-09 DIAGNOSIS — I252 Old myocardial infarction: Secondary | ICD-10-CM

## 2014-07-09 DIAGNOSIS — Z23 Encounter for immunization: Secondary | ICD-10-CM

## 2014-07-09 DIAGNOSIS — I25810 Atherosclerosis of coronary artery bypass graft(s) without angina pectoris: Secondary | ICD-10-CM

## 2014-07-09 DIAGNOSIS — K4191 Unilateral femoral hernia, without obstruction or gangrene, recurrent: Secondary | ICD-10-CM

## 2014-07-09 DIAGNOSIS — Z951 Presence of aortocoronary bypass graft: Secondary | ICD-10-CM

## 2014-07-09 HISTORY — DX: Presence of aortocoronary bypass graft: Z95.1

## 2014-07-09 HISTORY — DX: Old myocardial infarction: I25.2

## 2014-07-09 MED ORDER — CLOPIDOGREL BISULFATE 75 MG PO TABS: 75.0000 mg | ORAL_TABLET | Freq: Every day | ORAL | Status: DC

## 2014-07-09 MED ORDER — METOPROLOL TARTRATE 25 MG PO TABS: 25.0000 mg | ORAL_TABLET | Freq: Two times a day (BID) | ORAL | Status: DC

## 2014-07-09 NOTE — Progress Notes (Signed)
Pre visit review using our clinic review tool, if applicable. No additional management support is needed unless otherwise documented below in the visit note. 

## 2014-07-09 NOTE — Progress Notes (Signed)
Dr. Frederico Hamman T. Nakeeta Sebastiani, MD, [facility] [place], [address] Phone: [phone] Fax: [phone]  07/09/2014  Patient: Carlos Erickson,  MRN: [id number], DOB: September 26, 1951, 63 y.o.  Primary Physician:  Owens Loffler, MD  Chief Complaint: No chief complaint on file.  Subjective:   Carlos Erickson is a 63 y.o. pleasant patient who presents with the following:  Pleasant male who had a 05/11/2014 MI seen at [facility] and was transferred to [facility] and was ultimately given CABG x 2 at [facility] on 05/20/2014 and discharged on 8/202015 from [facility].   He has done well since then, but he has not obtained a local cardiologist. He saw [facility] in follow-up on 8/18, and everything is going well with the exception of expected incisional chest pain.   He has been taking it relatively easy, and he is feeling well overall.   Now has a hernia on the right, got post-cath.   HTN: Tolerating all medications without side effects Right now on Lopressor 12.5 mg po bid No CP, no sob. No HA.  BP Readings from Last 3 Encounters:  07/09/14 140/82  03/05/14 154/85  05/20/13 120/74     Patient Active Problem List   Diagnosis Date Noted  . Past heart attack, 05/11/2014 07/09/2014    Priority: High  . 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 CAD (coronary artery disease), native coronary artery 07/09/2014    Priority: High  . Hypertension   . GERD (gastroesophageal reflux disease)   . Arthritis    Past Medical History  Diagnosis Date  . Arthritis   . GERD (gastroesophageal reflux disease)   . Hypertension   . Colon polyps   . Past heart attack, 05/11/2014 07/09/2014  . 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 CAD (coronary artery disease), native coronary artery 07/09/2014   Past Surgical History  Procedure Laterality Date  . Eye surgery    . Lung biopsy  2006    VATS  . Hemorroidectomy    . Coronary artery bypass graft  05/2014    [facility], CABG x 2   History     Social History  . Marital Status: Married    Spouse Name: N/A    Number of Children: N/A  . Years of Education: N/A   Occupational History  . retired    Social History Main Topics  . Smoking status: Former Smoker    Types: Cigars  . Smokeless tobacco: Never Used  . Alcohol Use: Yes     Comment: occ beer  . Drug Use: No  . Sexual Activity: Yes    Partners: Female   Other Topics Concern  . Not on file   Social History Narrative  . No narrative on file   No family history on file. Allergies  Allergen Reactions  . Plasma Protein Fraction Anaphylaxis    Whole body rash, hypotension intra-operatively during CABG, responded to benadryl and famotidine  . Morphine And Related Other (See Comments)    hallucanations   Medication list has been reviewed and updated.   GEN: No acute illnesses, no fevers, chills. GI: No n/v/d, eating normally Pulm: No SOB Interactive and getting along well at home.  Otherwise, ROS is as per the HPI. Objective:   BP 140/82  Pulse 60  Temp(Src) 98 F (36.7 C) (Oral)  Ht 5' 8"  (1.727 m)  Wt 175 lb 8 oz (79.606 kg)  BMI 26.69 kg/m2  SpO2 98%   GEN: WDWN, NAD, Non-toxic, A & O x 3 HEENT: Atraumatic, Normocephalic. Neck supple. No masses, No LAD. Ears and Nose: No external deformity. CV: RRR, No M/G/R. No JVD. No thrill. No extra heart sounds. PULM: CTA B, no wheezes, crackles, rhonchi. No retractions. No resp. distress. No accessory muscle use. EXTR: No c/c/e NEURO Normal gait.  PSYCH: Normally interactive. Conversant. Not depressed or anxious appearing.  Calm demeanor.   Laboratory and Imaging Data: [facility] records reviewed.   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 We will ask Dr. Fletcher Anon to see him.  Increase Metoprolol to 25 mg BID  Reassured him. I could not feel hernia - only follow for now.   Follow-up: for CPX next year  New Prescriptions   No medications on file   Orders Placed This Encounter  Procedures  . Flu Vaccine QUAD 36+ mos IM  . Ambulatory referral to Cardiology    Signed,  Frederico Hamman T. Kanyah Matsushima, MD   Patient's Medications  New Prescriptions   No medications on file  Previous Medications   LANSOPRAZOLE (PREVACID) 15 MG CAPSULE    Take 15 mg by mouth daily as needed.    MISC NATURAL PRODUCTS (PROSTATE HEALTH) CAPS    Take 1 capsule by mouth daily.    SIMVASTATIN (ZOCOR) 20 MG TABLET    Take 20 mg by mouth at bedtime.  Modified Medications   Modified Medication Previous Medication   CLOPIDOGREL (PLAVIX) 75 MG TABLET clopidogrel (PLAVIX) 75 MG tablet      Take 1 tablet (75 mg total) by mouth daily.    Take 75 mg by mouth daily.   METOPROLOL TARTRATE (LOPRESSOR) 25 MG TABLET metoprolol tartrate (LOPRESSOR) 25 MG tablet      Take 1 tablet (25 mg total) by mouth 2 (two) times daily.    Take 12.5 mg by mouth 2 (two) times daily.  Discontinued Medications   ATENOLOL-CHLORTHALIDONE (TENORETIC) 100-25 MG PER TABLET    Take 1 tablet by mouth daily.   PREDNISONE 10 MG KIT    6 day tapering dose

## 2014-07-09 NOTE — Patient Instructions (Signed)

## 2014-07-10 DIAGNOSIS — K4191 Unilateral femoral hernia, without obstruction or gangrene, recurrent: Secondary | ICD-10-CM | POA: Insufficient documentation

## 2014-07-14 ENCOUNTER — Ambulatory Visit (INDEPENDENT_AMBULATORY_CARE_PROVIDER_SITE_OTHER): Payer: No Typology Code available for payment source | Admitting: Cardiovascular Disease

## 2014-07-14 ENCOUNTER — Encounter: Payer: Self-pay | Admitting: Cardiovascular Disease

## 2014-07-14 VITALS — BP 138/90 | HR 75 | Ht 67.5 in | Wt 176.8 lb

## 2014-07-14 DIAGNOSIS — I10 Essential (primary) hypertension: Secondary | ICD-10-CM

## 2014-07-14 DIAGNOSIS — E785 Hyperlipidemia, unspecified: Secondary | ICD-10-CM

## 2014-07-14 DIAGNOSIS — R0789 Other chest pain: Secondary | ICD-10-CM

## 2014-07-14 DIAGNOSIS — Z951 Presence of aortocoronary bypass graft: Secondary | ICD-10-CM

## 2014-07-14 NOTE — Assessment & Plan Note (Signed)
Lab Results  Component Value Date   CHOL 236* 05/22/2013   HDL 33.50* 05/22/2013   LDLDIRECT 145.5 05/22/2013   TRIG 344.0* 05/22/2013   CHOLHDL 7 05/22/2013   I requested a fasting lipid and liver profile since he has been on simvastatin. He might require a more potent statin. We also need to address high triglyceride. He does consume excessive amount of carbohydrate which was discussed with him.

## 2014-07-14 NOTE — Assessment & Plan Note (Signed)
He is doing reasonably well after recent CABG in August. He has no symptoms of angina. He is not interested in attending cardiac rehabilitation but continues to be active at home. Continue medical therapy. We discussed the importance of lifestyle changes.

## 2014-07-14 NOTE — Progress Notes (Signed)
Primary care physician: Dr. Lorelei Pont  HPI  This is a pleasant 63 year old man who is here today to establish cardiovascular care. He presented to [facility] in August of 2015 with chest pain and was found to have a small non-ST elevation myocardial infarction. He underwent cardiac catheterization by [facility] which showed significant complex two-vessel coronary artery disease including the LAD and RCA. He was transferred to [facility] where he underwent CABG. Ejection fraction was normal by echo. He has been doing reasonably well since then. Other medical issues include hypertension and hyperlipidemia. He did not attend cardiac rehabilitation and is not interested. He stays active at home. He denies chest tightness or shortness of breath.  Allergies  Allergen Reactions  . Plasma Protein Fraction Anaphylaxis    Whole body rash, hypotension intra-operatively during CABG, responded to benadryl and famotidine  . Aspirin Hives    Per pt, able to tolerate low dose aspirin.   Marland Kitchen Morphine And Related Other (See Comments)    hallucanations     Current Outpatient Prescriptions on File Prior to Visit  Medication Sig Dispense Refill  . clopidogrel (PLAVIX) 75 MG tablet Take 1 tablet (75 mg total) by mouth daily.  30 tablet  5  . lansoprazole (PREVACID) 15 MG capsule Take 15 mg by mouth daily as needed.       . metoprolol tartrate (LOPRESSOR) 25 MG tablet Take 1 tablet (25 mg total) by mouth 2 (two) times daily.  60 tablet  5  . Misc Natural Products (PROSTATE HEALTH) CAPS Take 1 capsule by mouth daily.       . simvastatin (ZOCOR) 20 MG tablet Take 20 mg by mouth at bedtime.       No current facility-administered medications on file prior to visit.     Past Medical History  Diagnosis Date  . Arthritis   . GERD (gastroesophageal reflux disease)   . Hypertension   . Colon polyps   . Past heart attack, 05/11/2014 07/09/2014  . 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 MI (myocardial infarction)   . CAD (coronary artery  disease), native coronary artery 05/2014    Non-ST elevation myocardial infarction. Echocardiogram showed normal LV systolic function with mild to moderate mitral regurgitation. Cardiac catheterization showed significant two-vessel coronary artery disease including the RCA and left anterior descending artery. He underwent CABG at [facility].  Marland Kitchen Hyperlipidemia      Past Surgical History  Procedure Laterality Date  . Eye surgery    . Lung biopsy  2006    VATS  . Hemorroidectomy    . Cardiac catheterization  03/2014    [facility]  . Coronary artery bypass graft  05/2014    [facility], CABG x 2     Family History  Problem Relation Age of Onset  . Stroke Mother   . Hypertension Mother      History   Social History  . Marital Status: Married    Spouse Name: N/A    Number of Children: N/A  . Years of Education: N/A   Occupational History  . retired    Social History Main Topics  . Smoking status: Former Smoker -- 1 years    Types: Cigars  . Smokeless tobacco: Never Used  . Alcohol Use: Yes     Comment: occ beer  . Drug Use: No  . Sexual Activity: Yes    Partners: Female   Other Topics Concern  . Not on file   Social History Narrative  . No narrative on file  ROS A 10 point review of system was performed. It is negative other than that mentioned in the history of present illness.   PHYSICAL EXAM   BP 138/90  Pulse 75  Ht 5' 7.5" (1.715 m)  Wt 176 lb 12 oz (80.173 kg)  BMI 27.26 kg/m2 Constitutional: He is oriented to person, place, and time. He appears well-developed and well-nourished. No distress.  HENT: No nasal discharge.  Head: Normocephalic and atraumatic.  Eyes: Pupils are equal and round.  No discharge. Neck: Normal range of motion. Neck supple. No JVD present. No thyromegaly present.  Cardiovascular: Normal rate, regular rhythm, normal heart sounds. Exam reveals no gallop and no friction rub. No murmur heard.  Pulmonary/Chest: Effort normal and breath sounds  normal. No stridor. No respiratory distress. He has no wheezes. He has no rales. He exhibits no tenderness. Surgical scar is healed. Abdominal: Soft. Bowel sounds are normal. He exhibits no distension. There is no tenderness. There is no rebound and no guarding.  Musculoskeletal: Normal range of motion. He exhibits no edema and no tenderness.  Neurological: He is alert and oriented to person, place, and time. Coordination normal.  Skin: Skin is warm and dry. No rash noted. He is not diaphoretic. No erythema. No pallor.  Psychiatric: He has a normal mood and affect. His behavior is normal. Judgment and thought content normal.       XJD:[id number]  Rhythm  -  Negative T-waves  -Possible  Anterior  ischemia.   ABNORMAL    ASSESSMENT AND PLAN

## 2014-07-14 NOTE — Assessment & Plan Note (Signed)
Blood pressure is reasonably controlled on current medications. 

## 2014-07-14 NOTE — Patient Instructions (Signed)
Schedule fasting labs.   Continue same medications.   Your physician wants you to follow-up in: 6 months.  You will receive a reminder letter in the mail two months in advance. If you don't receive a letter, please call our office to schedule the follow-up appointment.

## 2014-07-15 ENCOUNTER — Ambulatory Visit (INDEPENDENT_AMBULATORY_CARE_PROVIDER_SITE_OTHER): Payer: No Typology Code available for payment source | Admitting: *Deleted

## 2014-07-15 DIAGNOSIS — E785 Hyperlipidemia, unspecified: Secondary | ICD-10-CM

## 2014-07-16 LAB — HEPATIC FUNCTION PANEL
ALT: 14 IU/L (ref 0–44)
AST: 17 IU/L (ref 0–40)
Albumin: 4.4 g/dL (ref 3.6–4.8)
Alkaline Phosphatase: 80 IU/L (ref 39–117)
BILIRUBIN TOTAL: 0.4 mg/dL (ref 0.0–1.2)
Bilirubin, Direct: 0.11 mg/dL (ref 0.00–0.40)
Total Protein: 7.2 g/dL (ref 6.0–8.5)

## 2014-07-16 LAB — LIPID PANEL
CHOL/HDL RATIO: 4 ratio (ref 0.0–5.0)
CHOLESTEROL TOTAL: 160 mg/dL (ref 100–199)
HDL: 40 mg/dL (ref >39)
LDL CALC: 72 mg/dL (ref 0–99)
Triglycerides: 239 mg/dL — ABNORMAL HIGH (ref 0–149)
VLDL CHOLESTEROL CAL: 48 mg/dL — AB (ref 5–40)

## 2014-07-17 ENCOUNTER — Telehealth: Payer: Self-pay | Admitting: *Deleted

## 2014-07-17 DIAGNOSIS — E785 Hyperlipidemia, unspecified: Secondary | ICD-10-CM

## 2014-07-17 MED ORDER — ATORVASTATIN CALCIUM 40 MG PO TABS: 40.0000 mg | ORAL_TABLET | Freq: Every day | ORAL | Status: DC

## 2014-07-17 NOTE — Telephone Encounter (Signed)
Message copied by Tracie Harrier on Thu Jul 17, 2014  5:04 PM ------      Message from: Kathlyn Sacramento A      Created: Wed Jul 16, 2014  5:39 PM       Inform patient that labs were normal. Cholesterol was reasonable and triglycerides improved but still not normal. Switch Simvastatin to Atorvastatin 40 mg daily. Recheck same labs in 6 weeks. ------

## 2014-08-28 ENCOUNTER — Other Ambulatory Visit (INDEPENDENT_AMBULATORY_CARE_PROVIDER_SITE_OTHER): Payer: No Typology Code available for payment source

## 2014-08-28 DIAGNOSIS — E785 Hyperlipidemia, unspecified: Secondary | ICD-10-CM

## 2014-08-29 LAB — LIPID PANEL
CHOL/HDL RATIO: 3.7 ratio (ref 0.0–5.0)
Cholesterol, Total: 149 mg/dL (ref 100–199)
HDL: 40 mg/dL (ref >39)
LDL Calculated: 69 mg/dL (ref 0–99)
Triglycerides: 201 mg/dL — ABNORMAL HIGH (ref 0–149)
VLDL CHOLESTEROL CAL: 40 mg/dL (ref 5–40)

## 2014-08-29 LAB — HEPATIC FUNCTION PANEL
ALBUMIN: 4.3 g/dL (ref 3.6–4.8)
ALT: 17 IU/L (ref 0–44)
AST: 17 IU/L (ref 0–40)
Alkaline Phosphatase: 88 IU/L (ref 39–117)
BILIRUBIN DIRECT: 0.14 mg/dL (ref 0.00–0.40)
BILIRUBIN TOTAL: 0.5 mg/dL (ref 0.0–1.2)
Total Protein: 7.1 g/dL (ref 6.0–8.5)

## 2014-10-03 ENCOUNTER — Encounter: Payer: Self-pay | Admitting: Internal Medicine

## 2014-10-03 ENCOUNTER — Ambulatory Visit (INDEPENDENT_AMBULATORY_CARE_PROVIDER_SITE_OTHER): Payer: No Typology Code available for payment source | Admitting: Internal Medicine

## 2014-10-03 VITALS — BP 128/70 | HR 90 | Temp 97.6°F | Wt 181.0 lb

## 2014-10-03 DIAGNOSIS — J0100 Acute maxillary sinusitis, unspecified: Secondary | ICD-10-CM

## 2014-10-03 MED ORDER — AMOXICILLIN 500 MG PO TABS: 1000.0000 mg | ORAL_TABLET | Freq: Two times a day (BID) | ORAL | Status: DC

## 2014-10-03 NOTE — Assessment & Plan Note (Signed)
Likely viral but also focal inflammation in TMS Discussed supportive care---mostly analgesics If worsens, start amoxil

## 2014-10-03 NOTE — Progress Notes (Signed)
Subjective:    Patient ID: Carlos Erickson, male    DOB: 1951-08-02, 63 y.o.   MRN: [id number]  HPI Here due to bad cold  Having a sore throat Lots of mucus with his cough--green Sick for about 3 days Seems to have post nasal drip  Left ear pain Some head congestion Some headache Coughing day and night  No fever Feels hot --no chills or sweats No SOB  Tried mucinex bufferin and cough drops. Helped throat  Current Outpatient Prescriptions on File Prior to Visit  Medication Sig Dispense Refill  . atorvastatin (LIPITOR) 40 MG tablet Take 1 tablet (40 mg total) by mouth daily. 30 tablet 6  . clopidogrel (PLAVIX) 75 MG tablet Take 1 tablet (75 mg total) by mouth daily. 30 tablet 5  . lansoprazole (PREVACID) 15 MG capsule Take 15 mg by mouth daily as needed.     . metoprolol tartrate (LOPRESSOR) 25 MG tablet Take 1 tablet (25 mg total) by mouth 2 (two) times daily. 60 tablet 5  . Misc Natural Products (PROSTATE HEALTH) CAPS Take 1 capsule by mouth daily.      No current facility-administered medications on file prior to visit.    Allergies  Allergen Reactions  . Plasma Protein Fraction Anaphylaxis    Whole body rash, hypotension intra-operatively during CABG, responded to benadryl and famotidine  . Aspirin Hives    Per pt, able to tolerate low dose aspirin.   Marland Kitchen Morphine And Related Other (See Comments)    hallucanations    Past Medical History  Diagnosis Date  . Arthritis   . GERD (gastroesophageal reflux disease)   . Hypertension   . Colon polyps   . Past heart attack, 05/11/2014 07/09/2014  . 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 MI (myocardial infarction)   . CAD (coronary artery disease), native coronary artery 05/2014    Non-ST elevation myocardial infarction. Echocardiogram showed normal LV systolic function with mild to moderate mitral regurgitation. Cardiac catheterization showed significant two-vessel coronary artery disease including the RCA and left anterior descending  artery. He underwent CABG at [facility].  Marland Kitchen Hyperlipidemia     Past Surgical History  Procedure Laterality Date  . Eye surgery    . Lung biopsy  2006    VATS  . Hemorroidectomy    . Cardiac catheterization  03/2014    [facility]  . Coronary artery bypass graft  05/2014    [facility], CABG x 2    Family History  Problem Relation Age of Onset  . Stroke Mother   . Hypertension Mother     History   Social History  . Marital Status: Married    Spouse Name: N/A    Number of Children: N/A  . Years of Education: N/A   Occupational History  . retired    Social History Main Topics  . Smoking status: Former Smoker -- 1 years    Types: Cigars  . Smokeless tobacco: Never Used  . Alcohol Use: Yes     Comment: occ beer  . Drug Use: No  . Sexual Activity:    Partners: Female   Other Topics Concern  . Not on file   Social History Narrative   Review of Systems  No rash No vomiting or diarrhea Indigestion with mucinex. Appetite is off     Objective:   Physical Exam  Constitutional: He appears well-developed. No distress.  Coarse cough at times  HENT:  Mouth/Throat: Oropharynx is clear and moist. No oropharyngeal exudate.  No sinus tenderness Moderate nasal inflammation Focal redness in middle of both TMs--- L>R  Neck: Normal range of motion. Neck supple.  Pulmonary/Chest: Effort normal and breath sounds normal. No respiratory distress. He has no wheezes. He has no rales.  Lymphadenopathy:    He has no cervical adenopathy.          Assessment & Plan:

## 2014-10-03 NOTE — Progress Notes (Signed)
Pre visit review using our clinic review tool, if applicable. No additional management support is needed unless otherwise documented below in the visit note. 

## 2014-10-03 NOTE — Patient Instructions (Signed)
If you worsen overall, or the ear pain returns, start the antibiotic.

## 2015-01-05 ENCOUNTER — Ambulatory Visit (INDEPENDENT_AMBULATORY_CARE_PROVIDER_SITE_OTHER): Payer: Medicare Other | Admitting: Cardiovascular Disease

## 2015-01-05 ENCOUNTER — Encounter: Payer: Self-pay | Admitting: Cardiovascular Disease

## 2015-01-05 VITALS — BP 155/97 | HR 56 | Ht 68.0 in | Wt 187.2 lb

## 2015-01-05 DIAGNOSIS — R0602 Shortness of breath: Secondary | ICD-10-CM

## 2015-01-05 DIAGNOSIS — R0789 Other chest pain: Secondary | ICD-10-CM

## 2015-01-05 DIAGNOSIS — I10 Essential (primary) hypertension: Secondary | ICD-10-CM | POA: Diagnosis not present

## 2015-01-05 DIAGNOSIS — G629 Polyneuropathy, unspecified: Secondary | ICD-10-CM

## 2015-01-05 DIAGNOSIS — E785 Hyperlipidemia, unspecified: Secondary | ICD-10-CM

## 2015-01-05 DIAGNOSIS — Z951 Presence of aortocoronary bypass graft: Secondary | ICD-10-CM

## 2015-01-05 MED ORDER — CLOPIDOGREL BISULFATE 75 MG PO TABS: 75.0000 mg | ORAL_TABLET | Freq: Every day | ORAL | Status: DC

## 2015-01-05 MED ORDER — ATORVASTATIN CALCIUM 40 MG PO TABS: 40.0000 mg | ORAL_TABLET | Freq: Every day | ORAL | Status: DC

## 2015-01-05 MED ORDER — METOPROLOL TARTRATE 25 MG PO TABS: 25.0000 mg | ORAL_TABLET | Freq: Two times a day (BID) | ORAL | Status: DC

## 2015-01-05 MED ORDER — AMLODIPINE BESYLATE 5 MG PO TABS: 5.0000 mg | ORAL_TABLET | Freq: Every day | ORAL | Status: DC

## 2015-01-05 NOTE — Assessment & Plan Note (Signed)
Lab Results  Component Value Date   CHOL 149 08/28/2014   HDL 40 08/28/2014   LDLCALC 69 08/28/2014   LDLDIRECT 145.5 05/22/2013   TRIG 201* 08/28/2014   CHOLHDL 3.7 08/28/2014   Lipid profile improved significantly after switching him from simvastatin to atorvastatin. Most recent LDL was 69. Triglyceride was mildly elevated.

## 2015-01-05 NOTE — Patient Instructions (Addendum)
Your physician has recommended you make the following change in your medication:  Decrease Metoprolol to 25 mg twice daily  Start Norvasc 5 mg once daily   Your physician wants you to follow-up in: 6 months. You will receive a reminder letter in the mail two months in advance. If you don't receive a letter, please call our office to schedule the follow-up appointment.   Your medication has been refilled as requested

## 2015-01-05 NOTE — Assessment & Plan Note (Signed)
Blood pressure has been elevated. He has been doubling the dose of metoprolol but he is mildly bradycardic. Thus, I instructed him to decrease metoprolol back to 25 mg twice daily. I started him on amlodipine 5 mg once daily.

## 2015-01-05 NOTE — Progress Notes (Signed)
Primary care physician: Dr. Lorelei Pont  HPI  This is a pleasant 64 year old man who is here today for a follow-up visit regarding coronary artery disease.  He presented to [facility] in August of 2015 with chest pain and was found to have a small non-ST elevation myocardial infarction. He underwent cardiac catheterization  which showed significant complex two-vessel coronary artery disease including the LAD and RCA. He was transferred to [facility] where he underwent CABG. Ejection fraction was normal by echo.  Other medical issues include hypertension and hyperlipidemia.  He had some superficial chest pain at the incision site since then with numbness. He also complains of numbness in both feet. He feels like he is walking on a blister with tightness in his calves.  Allergies  Allergen Reactions  . Plasma Protein Fraction Anaphylaxis    Whole body rash, hypotension intra-operatively during CABG, responded to benadryl and famotidine  . Aspirin Hives    Per pt, able to tolerate low dose aspirin.   Marland Kitchen Morphine And Related Other (See Comments)    hallucanations     Current Outpatient Prescriptions on File Prior to Visit  Medication Sig Dispense Refill  . atorvastatin (LIPITOR) 40 MG tablet Take 1 tablet (40 mg total) by mouth daily. 30 tablet 6  . clopidogrel (PLAVIX) 75 MG tablet Take 1 tablet (75 mg total) by mouth daily. 30 tablet 5  . lansoprazole (PREVACID) 15 MG capsule Take 15 mg by mouth daily as needed.     . metoprolol tartrate (LOPRESSOR) 25 MG tablet Take 1 tablet (25 mg total) by mouth 2 (two) times daily. (Patient taking differently: Take 50 mg by mouth 2 (two) times daily. ) 60 tablet 5   No current facility-administered medications on file prior to visit.     Past Medical History  Diagnosis Date  . Arthritis   . GERD (gastroesophageal reflux disease)   . Hypertension   . Colon polyps   . Past heart attack, 05/11/2014 07/09/2014  . 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 MI (myocardial infarction)    . CAD (coronary artery disease), native coronary artery 05/2014    Non-ST elevation myocardial infarction. Echocardiogram showed normal LV systolic function with mild to moderate mitral regurgitation. Cardiac catheterization showed significant two-vessel coronary artery disease including the RCA and left anterior descending artery. He underwent CABG at [facility].  Marland Kitchen Hyperlipidemia      Past Surgical History  Procedure Laterality Date  . Eye surgery    . Lung biopsy  2006    VATS  . Hemorroidectomy    . Cardiac catheterization  03/2014    [facility]  . Coronary artery bypass graft  05/2014    [facility], CABG x 2     Family History  Problem Relation Age of Onset  . Stroke Mother   . Hypertension Mother      History   Social History  . Marital Status: Married    Spouse Name: N/A  . Number of Children: N/A  . Years of Education: N/A   Occupational History  . retired    Social History Main Topics  . Smoking status: Former Smoker -- 1 years    Types: Cigars  . Smokeless tobacco: Never Used  . Alcohol Use: Yes     Comment: occ beer  . Drug Use: No  . Sexual Activity:    Partners: Female   Other Topics Concern  . Not on file   Social History Narrative     ROS A 10 point  review of system was performed. It is negative other than that mentioned in the history of present illness.   PHYSICAL EXAM   BP 155/97 mmHg  Pulse 56  Ht 5\' 8"  (1.727 m)  Wt 187 lb 4 oz (84.936 kg)  BMI 28.48 kg/m2 Constitutional: He is oriented to person, place, and time. He appears well-developed and well-nourished. No distress.  HENT: No nasal discharge.  Head: Normocephalic and atraumatic.  Eyes: Pupils are equal and round.  No discharge. Neck: Normal range of motion. Neck supple. No JVD present. No thyromegaly present.  Cardiovascular: Normal rate, regular rhythm, normal heart sounds. Exam reveals no gallop and no friction rub. No murmur heard.  Pulmonary/Chest: Effort normal and breath sounds  normal. No stridor. No respiratory distress. He has no wheezes. He has no rales. He exhibits no tenderness. Surgical scar is healed. Abdominal: Soft. Bowel sounds are normal. He exhibits no distension. There is no tenderness. There is no rebound and no guarding.  Musculoskeletal: Normal range of motion. He exhibits no edema and no tenderness.  Neurological: He is alert and oriented to person, place, and time. Coordination normal.  Skin: Skin is warm and dry. No rash noted. He is not diaphoretic. No erythema. No pallor.  Psychiatric: He has a normal mood and affect. His behavior is normal. Judgment and thought content normal.  Vascular: Radial pulses are normal. Posterior tibial and dorsalis pedis are normal bilaterally.     WYO:VZCHY  Rhythm  -  Negative T-waves  -Possible  Anterior  ischemia.   ABNORMAL  Unchanged  ASSESSMENT AND PLAN

## 2015-01-05 NOTE — Assessment & Plan Note (Signed)
He is doing well overall with no clear symptoms of angina. He does describe left shoulder pain that starts early in exercise but disappears quickly. This has been stable since his CABG. Continue medical therapy.

## 2015-01-05 NOTE — Assessment & Plan Note (Signed)
He is having bilateral foot numbness. Distal pulses are normal and strong. Thus, he does not seem to have peripheral arterial disease. The etiology of this is not entirely clear and asked him to follow-up with Dr. Lorelei Pont for evaluation.

## 2015-01-29 ENCOUNTER — Telehealth: Payer: Self-pay | Admitting: Cardiovascular Disease

## 2015-01-29 NOTE — Telephone Encounter (Signed)
Pt c/o BP issue: STAT if pt c/o blurred vision, one-sided weakness or slurred speech  1. What are your last 5 BP readings?  Digital readings: 04-06  10pm  144/95    04-07 5 am 172/101    04-07 8 am 138/89         (patient says Avg 145/95 when checks during day)  2. Are you having any other symptoms (ex. Dizziness, headache, blurred vision, passed out)? Sometimes weak but not pass out weak.  Patient stops to rest for a minute when working.  No other symptoms  3. What is your BP issue? Patient wants to know if meds will need to be changed before he goes to get refill in 2 weeks.  Said Dr. Rockey Situ told him this may be a possibility after starting new medicine.  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

## 2015-01-30 MED ORDER — CARVEDILOL 6.25 MG PO TABS: 6.2500 mg | ORAL_TABLET | Freq: Two times a day (BID) | ORAL | Status: DC

## 2015-01-30 NOTE — Telephone Encounter (Signed)
Spoke w/ pt.  Advised him of Dr. Arida's recommendation.  He verbalizes understanding and will call back w/ any questions or concerns.  

## 2015-01-30 NOTE — Telephone Encounter (Signed)
Switch Metoprolol to Carvedilol 6.25 mg bid and continue Amlodipine.

## 2015-02-14 NOTE — Discharge Summary (Signed)
PATIENT NAME:  Carlos Erickson, Carlos Carlos Erickson MR#:  [id number] DATE OF BIRTH:  Apr 26, 1951  DATE OF ADMISSION:  05/11/2014 DATE OF DISCHARGE:  05/13/2014  TRANSFER SUMMARY  This is likely transfer to [facility] Cardiology under Dr. Sherlean Foot who is accepting physician at [facility].  REASON FOR TRANSFER:  Complex coronary disease, likely requiring stenting at tertiary care.   SECONDARY DIAGNOSES: 1.  Chest pain.  2.  Hypertension.  3.  Gout.   CONSULTATION:  Cardiology, Dr. Lujean Amel.   PROCEDURES AND RADIOLOGY:  Cardiac catheterization on the 21st of July showed RCA disease and some coronary fistula.  RCA disease was not amenable to stent while here and was thought to be complex enough to be transferred to tertiary care center per cardiology and Dr. Sherlean Foot at [facility] has accepted the patient.  In discussion with Dr. Clayborn Bigness, the patient will be transferred either later today or tomorrow depending on the bed availability at [facility].  The patient remained chest pain-free and he is agreeable with the transfer.   Chest x-ray on admission showed no acute cardiopulmonary disease.   2-D echocardiogram on 20th of July showed LVEF of 70% to 75%, otherwise normal.  Normal LV systolic function, mild to moderate mitral valve regurgitation.   HISTORY AND SHORT HOSPITAL COURSE:  The patient is a 64 year old male with no significant medical problems who was admitted for chest pain.  The patient was evaluated by cardiology, Dr. Lujean Amel considering elevated cardiac enzymes.  Cardiac cath was recommended which was performed on 21st of July showing complex RCA disease requiring tertiary care transfer.  The patient has been accepted by Dr. Sherlean Foot at [facility] for possible further intervention, once the bed gets available he might get transferred later today or possibly tomorrow.   Total time discharging this patient was more than 35 minutes.      ____________________________ Lucina Mellow. Manuella Ghazi, MD vss:ea D: 05/13/2014 17:16:14 ET T: 05/13/2014 17:31:05 ET JOB#: [id number]  cc: Deandra Gadson S. Manuella Ghazi, MD, <Dictator> Dr. Sherlean Foot at [facility] Dwayne D. Clayborn Bigness, [place] MD ELECTRONICALLY SIGNED 05/15/2014 20:47

## 2015-02-14 NOTE — H&P (Signed)
PATIENT NAME:  Carlos Erickson, Carlos Erickson MR#:  [id number] DATE OF BIRTH:  1951/07/15  DATE OF ADMISSION:  05/12/2014  REFERRING PHYSICIAN: Dr. Thomasene Lot.  CHIEF COMPLAINT: Chest pain.  HISTORY OF PRESENT ILLNESS: A 64 year old gentleman with history of hypertension presenting with chest pain. Describes 3-day duration of intermittent chest pain with no known associated circumstances not related to activity. He describes chest pain as retrosternal in location radiating to the left chest, pressure and achy in quality, 8 to 10 in intensity. No worsening factors, relieved in the ER with nitroglycerin. Had associated diaphoresis. Denies any nausea or shortness of breath. Initial EKG and cardiac enzymes were within normal limits; however, repeat troponin became positive. Asymptomatic at this time.   REVIEW OF SYSTEMS:   CONSTITUTIONAL: Denies fever, chills, fatigue, weakness.  EYES: Denies blurry, double vision, or eye pain.  HEENT: Denies tinnitus, ear pain, hearing loss.  RESPIRATORY: Denies cough, wheeze, shortness of breath. CARDIOVASCULAR: Positive for chest pain as described above. Denies any orthopnea, edema, palpitations.  GASTROINTESTINAL: Denies nausea, vomiting, diarrhea, abdominal pain.  GENITOURINARY: Denies dysuria or hematuria.  ENDOCRINE: Denies nocturia or thyroid problems.  HEMATOLOGIC AND LYMPHATIC: Denies easy bruising, bleeding. SKIN: Denies rashes or lesions. MUSCULOSKELETAL: Denies pain in neck, back, shoulders, knees, hips, or arthritic symptoms.  NEUROLOGIC: Denies paralysis or paresthesias.  PSYCHIATRIC: Denies anxiety or depressive symptoms.   Otherwise, full review of systems performed by me is negative.   PAST MEDICAL HISTORY: Hypertension, gout.   SOCIAL HISTORY: Positive for tobacco use, smokes 1 cigar daily, denies any alcohol or drug usage.   FAMILY HISTORY: Positive for late onset coronary artery disease.   ALLERGIES: ASPIRIN AND MORPHINE.   HOME MEDICATIONS: Include  atenolol of unknown dosage.   PHYSICAL EXAMINATION:  VITAL SIGNS: Temperature 98.5, heart rate 63, respirations 18, blood pressure 144/87, saturating 96% on room air. Weight 80.7 kg, BMI 27.9.  GENERAL: Well-nourished, well-developed, Caucasian gentleman currently in no acute distress.  HEAD: Normocephalic, atraumatic.  EYES: Pupils equal, round, reactive to light. Extraocular muscles intact. No scleral icterus.  MOUTH: Moist mucous membranes. Dentition intact. No abscess noted.  EARS, NOSE, AND THROAT: Clear without exudates. No external lesions.  NECK: Supple. No thyromegaly. No nodules. No JVD.  PULMONARY: Clear to auscultation bilaterally without wheezes, rales, or rhonchi. No use of accessory muscles. Good respiratory effort.  CHEST:  Nontender on palpation.  CARDIOVASCULAR: S1, S2, regular rate and rhythm. No murmurs, rubs, or gallops. No edema. Pedal pulses 2+ bilaterally.  GASTROINTESTINAL: Soft, nontender, nondistended. No masses. Positive bowel sounds. No hepatosplenomegaly.  MUSCULOSKELETAL: No swelling, clubbing, or edema. Range of motion is full in all extremities. NEUROLOGIC: Cranial nerves II through XII intact. No gross focal neurologic deficits. Sensation intact. Reflexes intact.  SKIN: No ulcerations, lesions, no rashes, or cyanosis. Skin warm and dry. Turgor intact. PSYCHIATRIC: Mood and affect within normal limits. The patient alert and oriented x 3. Insight and judgment intact.   LABORATORY DATA: EKG performed reveals normal sinus rhythm with less than 0.5 mm ST depression in lead V1 through III. Chest x-ray performed reveals no acute cardiopulmonary process.   Remainder of laboratory data: Sodium 136, potassium 3.3, chloride 101, bicarbonate 25, BUN 17, creatinine 1.2, glucose 137. First troponin 0.02. His second troponin is 0.21. WBC 9.9, hemoglobin 15.8, platelets of 250,000.   ASSESSMENT AND PLAN: A 64 year old gentleman with history of hypertension, presenting with  chest pain ruled in via cardiac enzymes. 1. Non-ST segment elevation myocardial infarction. Initiate statin therapy. Place on telemetry.  Trend cardiac enzymes x 3, initiate therapeutic Lovenox 1 mg/kg b.i.d. cardiology consult. Check a transthoracic echocardiogram. For chest pain, provide nitroglycerin for repeat chest pain. Provide pain medications for repeat chest pain. 2. Hypokalemia, replace to a goal of 4 to 5.  3. Venous thromboembolism prophylaxis with therapeutic Lovenox.  CODE STATUS: The patient is full code.   TIME SPENT: 45 minutes.    ____________________________ Aaron Mose. Toma Erichsen, MD dkh:lt D: 05/12/2014 00:20:13 ET T: 05/12/2014 00:39:53 ET JOB#: [id number]  cc: Aaron Mose. Shaft Corigliano, MD, <Dictator> Jaivyn Gulla Woodfin Ganja MD ELECTRONICALLY SIGNED 05/12/2014 20:29

## 2015-02-14 NOTE — Consult Note (Signed)
Chief Complaint:  Subjective/Chief Complaint Status post cath with complex coronary disease will provided to be transferred to [facility] for further evaluation. chest pain-free at the moment.   VITAL SIGNS/ANCILLARY NOTES: **Vital Signs.:   21-Jul-15 11:49  Vital Signs Type Routine  Temperature Temperature (F) 98.6  Celsius 37  Pulse Pulse 70  Respirations Respirations 16  Systolic BP Systolic BP 161  Diastolic BP (mmHg) Diastolic BP (mmHg) 87  Mean BP 104  Pulse Ox % Pulse Ox % 98  Pulse Ox Activity Level  At rest  Oxygen Delivery Room Air/ 21 %  *Intake and Output.:   Daily 21-Jul-15 07:00  Grand Totals Intake:  480 Output:  2850    Net:  -2370 24 Hr.:  -2370  Oral Intake      In:  480  Urine ml     Out:  2850  Length of Stay Totals Intake:  480 Output:  2850    Net:  -2370   Brief Assessment:  GEN well developed, well nourished, no acute distress   Cardiac Regular  murmur present  --Gallop   Respiratory normal resp effort  clear BS   Gastrointestinal Normal   Gastrointestinal details normal Soft  Nontender  Nondistended   EXTR negative cyanosis/clubbing, negative edema   Lab Results: LabObservation:  20-Jul-15 09:55   OBSERVATION Reason for Test  Cardiology:  20-Jul-15 09:55   Echo Doppler REASON FOR EXAM:     COMMENTS:     PROCEDURE: [facility] - ECHO DOPPLER COMPLETE(TRANSTHOR)  - May 12 2014  9:55AM   RESULT: Echocardiogram Report  Patient Name:   BARLOW HARRISON Date of Exam: 05/12/2014 Medical Rec #:  [id number]         Custom1: Date of Birth:  Aug 27, 1951       Height:       66.9 in Patient Age:    64 years       Weight:       176.4 lb Patient Gender: M              BSA:          1.92 m??  Indications: MI Sonographer:    Sherrie Sport RDCS Referring Phys: Valentino Nose, K  Summary:  1. Left ventricular ejection fraction, by visual estimation, is 70 to  75%.  2. Normal global left ventricular systolic function.  3. Mild thickening of the anterior and posterior  mitral valve leaflets.  4. Mild to moderate mitral valve regurgitation. 2D AND M-MODE MEASUREMENTS (normal ranges within parentheses): Left Ventricle:          Normal IVSd (2D):      1.07 cm (0.7-1.1) LVPWd (2D):     1.08 cm (0.7-1.1) Aorta/LA:                  Normal LVIDd (2D):     4.90 cm (3.4-5.7) Aortic Root (2D): 3.60 cm (2.4-3.7) LVIDs (2D):     2.80 cm           Left Atrium (2D): 4.40 cm (1.9-4.0) LV FS (2D):     42.9 %   (>25%) LV EF (2D):     73.8 %   (>50%)                                   Right Ventricle:    RVd (2D):        4.09 cm LV DIASTOLIC FUNCTION:  MV Peak E: 0.62 m/s E/e' Ratio: 6.60 MV Peak A: 0.58 m/s Decel Time: 301 msec E/A Ratio: 1.07 SPECTRAL DOPPLER ANALYSIS (where applicable): Mitral Valve: MV P1/2 Time: 87.29 msec MV Area, PHT: 2.52 cm?? Aortic Valve: AoV Max Vel: 1.27 m/s AoV Peak PG: 6.5 mmHg AoV Mean PG: LVOT Vmax: 0.76 m/s LVOT VTI:  LVOT Diameter: 2.10 cm AoV Area, Vmax: 2.08 cm?? AoV Area, VTI:  AoV Area, Vmn: Tricuspid Valve and PA/RV Systolic Pressure: TR Max Velocity: 2.16 m/s RA   Pressure: 5 mmHg RVSP/PASP: 23.7 mmHg Pulmonic Valve: PV Max Velocity: 0.92 m/s PV Max PG: 3.4 mmHg PV Mean PG:  PHYSICIAN INTERPRETATION: Left Ventricle: The left ventricular internal cavity size was normal. LV  septal wall thickness was normal. LV posterior wall thickness was normal.  Global LV systolic function was normal. Left ventricular ejection  fraction, by visual estimation, is 70 to 75%. Right Ventricle: The right ventricular size is normal. Global RV systolic  function is normal. Left Atrium: The left atrium is normal in size. Right Atrium: The right atrium is normal in size. Pericardium: There is no evidence of pericardial effusion. Mitral Valve: The mitral valve is normal in structure. There ismild   thickening of the anterior and posterior mitral valve leaflets. Mild to  moderate mitral valve regurgitation is seen. Tricuspid Valve: The  tricuspid valve is normal. Trivial tricuspid  regurgitation is visualized. The tricuspid regurgitant velocity is 2.16  m/s, and with an assumed right atrial pressure of 5 mmHg, the estimated  right ventricular systolic pressure is normal at 23.7 mmHg. Aortic Valve: The aortic valve is normal. No evidence of aortic valve  regurgitation is seen. Pulmonic Valve: The pulmonic valve is normal.  [place] MD Electronically signed by [place] MD Signature Date/Time: 05/12/2014/12:51:15 PM  *** Final *** IMPRESSION: .    Verified By: Yolonda Kida, M.D., MD    10:55   Ventricular Rate 54  Atrial Rate 54  P-R Interval 140  QRS Duration 100  QT 440  QTc 417  P Axis 47  R Axis 61  T Axis 52  ECG interpretation Sinus bradycardia ST & T wave abnormality, consider anterior ischemia Abnormal ECG When compared with ECG of 11-May-2014 23:58, there has been progression of the anterior T wave abnormality.  Confirmed by Grayland Jack [phone]) on 05/13/2014 8:37:09 AM  Overreader: Grayland Jack  Routine Chem:  20-Jul-15 02:56   Result Comment TROPONIN - RESULTS VERIFIED BY REPEAT TESTING.  - ELEVATED TROPONIN PREVIOUSLY CALLED AT  - 2348 05/11/14.PMH  Result(s) reported on 12 May 2014 at 03:53AM.  Result Comment VLDL/LDL - Unable to report VLDL and LDL due to a  - Triglyceride value that is 400 mg/dL or   - greater.  Result(s) reported on 12 May 2014 at 03:40AM.  Cholesterol, Serum 184  Triglycerides, Serum  567  HDL (INHOUSE)  26  VLDL Cholesterol Calculated SEE COMMENT  LDL Cholesterol Calculated SEE COMMENT    12:34   Glucose, Serum  108  BUN  21  Creatinine (comp) 0.94  Sodium, Serum 138  Potassium, Serum  3.3  Chloride, Serum 102  CO2, Serum 28  Calcium (Total), Serum  8.3  Anion Gap 8  Osmolality (calc) 279  eGFR (African American) >60  eGFR (Non-African American) >60 (eGFR values <32m/min/1.73 m2 may be an indication of chronic kidney  disease (CKD). Calculated eGFR is useful in patients with stable renal function. The eGFR calculation will not be reliable  in acutely ill patients when serum creatinine is changing rapidly. It is not useful in  patients on dialysis. The eGFR calculation may not be applicable to patients at the low and high extremes of body sizes, pregnant women, and vegetarians.)  Cardiac:  20-Jul-15 02:56   CK, Total 124 (39-308 NOTE: NEW REFERENCE RANGE  11/25/2013)  CPK-MB, Serum 2.4 (Result(s) reported on 12 May 2014 at 03:49AM.)  Troponin I  0.24 (0.00-0.05 0.05 ng/mL or less: NEGATIVE  Repeat testing in 3-6 hrs  if clinically indicated. >0.05 ng/mL: POTENTIAL  MYOCARDIAL INJURY. Repeat  testing in 3-6 hrs if  clinically indicated. NOTE: An increase or decrease  of 30% or more on serial  testing suggests a  clinically important change)   Radiology Results: XRay:    19-Jul-15 19:56, Chest PA and Lateral  Chest PA and Lateral   REASON FOR EXAM:    chest pain  -  ed waiting room  COMMENTS:       PROCEDURE: DXR - DXR CHEST PA (OR AP) AND LATERAL  - May 11 2014  7:56PM     CLINICAL DATA:  Chest pain.  Shortness of breath.    EXAM:  CHEST  2 VIEW    COMPARISON:  08/03/2005    FINDINGS:  The heart size and mediastinal contours are within normal limits.  Both lungs are clear. The visualized skeletal structures are  unremarkable.     IMPRESSION:  No active cardiopulmonary disease.      Electronically Signed    By: Sherryl Barters M.D.    On: 05/11/2014 20:02         Verified By: Carron Curie, M.D.,  Cardiac Catherization:    21-Jul-15 14:52, Cardiac Catheterization  Cardiac Catheterization   [facility]  [address], [place] [address]  [phone]     Cardiovascular Catheterization Comprehensive Report     Patient: Draydon Clairmont  Study date: 05/13/2014  MR number: [id number]  Account number: [id number]     DOB: 26-May-1951  Age: 24  years  Gender: Male  Race: White  Height: 66.9 in  Weight: 175.3 lb     Interventional Cardiologist:  Lujean Amel, MD     SUMMARY:     -Summary: Normal LVF  Normal Wall Motion  EF=55%  Lmain ok  LAD diffuse 80% diffuse prox/mid  Circ large with shunt AV fistula  RCA 75% prox/mid unusual take off  Concern for AV fistula ,probably no clinically significant  IMP  Multivessel CAD  Rec transfer to [facility] for complex intervention vs CAGB     CORONARY CIRCULATION: The coronary circulation is right dominant.  Proximal LAD: There was a diffuse 80 % stenosis. Mid LAD: The vessel  was medium to large sized. Angiography showed severe atherosclerosis.  Distal LAD: Angiography showed moderate atherosclerosis. Proximal  RCA: There was a 75 % stenosis.     VENTRICLES: There were no left ventricular global or regional wall  motion abnormalities. The left ventricle was normal in size.     VALVES: AORTIC VALVE: The aortic valve was evaluated by left  ventriculography. The aortic valve appearedto be structurally  normal. The aortic valve leaflets exhibited normal thickness and  normal excursion. There was no aortic stenosis. MITRAL VALVE: The  mitral valve was evaluated by left ventriculography. The mitral valve  appeared grossly normal. The mitral leaflets exhibited normal  thickness and normal excursion. The mitral valve exhibited no  regurgitation.     INDICATIONS: Angina/MI:  unstable angina, CCS class III.     HISTORY: The patient has hypertension, a history of current cigarette  use, and a family history of coronary artery disease.     PRIOR DIAGNOSTIC TEST RESULTS: No prior stress test is available. The  following pre-procedure tests were not performed: stress ECG, stress  echocardiogram, stress nuclear, stress test with cardiac magnetic  resonance, cardiac computerized tomographic angiography, or calcium  score.     PROCEDURES PERFORMED: Left heart catheterization  with  ventriculography. Procedure: Successful Closure with Mynx     COMPLICATIONS: No complication occurred during the cath lab visit.     PROCEDURE: The risks and alternatives of the procedures and conscious  sedation were explained to the patient and informed consent was  obtained. The patient was brought to the cath lab and placed on the  table. The planned puncture sites were prepped and draped in the  usual sterile fashion.     -Right femoral artery access. The vessel was accessed, a wire was  threaded into the vessel, and a was advanced over the wire into the  vessel.     -Left heart catheterization. A catheter was advanced to the ascending  aorta. Ventriculography was performed using power injection of  contrast agent.     -Successful Closure with Mynx.     PROCEDURE COMPLETION: TIMING: Test started at 15:36. Test concluded at  16:05. RADIATION EXPOSURE: Fluoroscopy time: 8.23 min. Fluoroscopy  dose: 2.075 Gray.  MEDICATIONS GIVEN: Midazolam, 1 mg, IV, at 15:34. Fentanyl, 25 mcg,  IV, at 15:34.  CONTRAST GIVEN: 300 ml Maximum Allowable Contrast Dose. Isovue 180 ml.     Prepared and signedby     Lujean Amel, MD  Signed 05/19/2014 13:53:35     STUDY DIAGRAM     Angiographic findings  Native coronary lesions:   Proximal LAD: Lesion 1: diffuse, 80 % stenosis.   Proximal RCA: Lesion 1: 75 % stenosis.     HEMODYNAMIC TABLES     Pressures:  Baseline  Pressures:  - HR: 77  Pressures:  - Rhythm:  Pressures:  -- Aortic Pressure (S/D/M): 127/71/94  Pressures:  -- Left Ventricle (s/edp): 125/9/--     Outputs:  Baseline  Outputs:  -- CALCULATIONS[age]  Outputs:  -- CALCULATIONS: Body Surface Area: 1.91  Outputs:  -- CALCULATIONS: Height in cm: 170.00  Outputs:  -- CALCULATIONS: Sex: Male  Outputs:  -- CALCULATIONS: Weight in kg: 79.70  Cardiology:    19-Jul-15 19:26, ED ECG  Ventricular Rate 64  Atrial Rate 64  P-R Interval 136  QRS Duration  102  QT 418  QTc 431  P Axis 54  R Axis 56  T Axis 31  ECG interpretation   Normal sinus rhythm  Normal ECG  No previous ECGs available  ----------unconfirmed----------  Confirmed by OVERREAD, NOT ([id number]), editor PEARSON, BARBARA ([id number]) on 05/13/2014 9:14:12 AM  ED ECG     19-Jul-15 23:58, ECG  Ventricular Rate 66  Atrial Rate 66  P-R Interval 136  QRS Duration 96  QT 408  QTc 427  P Axis 43  R Axis 50  T Axis 73  ECG interpretation   Normal sinus rhythm  Nonspecific ST abnormality  Abnormal ECG  When compared with ECG of 11-May-2014 19:26,  No significant change was found  ----------unconfirmed----------  Confirmed by OVERREAD, NOT ([id number]), editor PEARSON, BARBARA ([id number]) on 7/21/20159:14:20 AM  ECG     20-Jul-15 09:55, Echo Doppler  Echo Doppler  REASON FOR EXAM:      COMMENTS:       PROCEDURE: [facility] - ECHO DOPPLER COMPLETE(TRANSTHOR)  - May 12 2014  9:55AM     RESULT: Echocardiogram Report    Patient Name:   XION DEBRUYNE Date of Exam: 05/12/2014  Medical Rec #:  [id number]         Custom1:  Date of Birth:  Mar 15, 1951       Height:       66.9 in  Patient Age:    78 years       Weight:       176.4 lb  Patient Gender: M              BSA:          1.92 m??    Indications: MI  Sonographer:    Sherrie Sport RDCS  Referring Phys: Valentino Nose, K    Summary:   1. Left ventricular ejection fraction, by visual estimation, is 70 to   75%.   2. Normal global left ventricular systolic function.   3. Mild thickening of the anterior and posterior mitral valve leaflets.   4. Mild to moderate mitral valve regurgitation.  2D AND M-MODE MEASUREMENTS (normal ranges within parentheses):  Left Ventricle:          Normal  IVSd (2D):      1.07 cm (0.7-1.1)  LVPWd (2D):     1.08 cm (0.7-1.1) Aorta/LA:                  Normal  LVIDd (2D):     4.90 cm (3.4-5.7) Aortic Root (2D): 3.60 cm (2.4-3.7)  LVIDs (2D):     2.80 cm           Left Atrium (2D): 4.40 cm (1.9-4.0)  LV FS (2D):     42.9 %    (>25%)  LV EF (2D):     73.8 %   (>50%)                                    Right Ventricle:     RVd (2D):        1.10 cm  LV DIASTOLIC FUNCTION:  MV Peak E: 0.62 m/s E/e' Ratio: 6.60  MV Peak A: 0.58 m/s Decel Time: 301 msec  E/A Ratio: 1.07  SPECTRAL DOPPLER ANALYSIS (where applicable):  Mitral Valve:  MV P1/2 Time: 87.29 msec  MV Area, PHT: 2.52 cm??  Aortic Valve: AoV Max Vel: 1.27 m/s AoV Peak PG: 6.5 mmHg AoV Mean PG:  LVOT Vmax: 0.76 m/s LVOT VTI:  LVOT Diameter: 2.10 cm  AoV Area, Vmax: 2.08 cm?? AoV Area, VTI:  AoV Area, Vmn:  Tricuspid Valve and PA/RV Systolic Pressure: TR Max Velocity: 2.16 m/s RA     Pressure: 5 mmHg RVSP/PASP: 23.7 mmHg  Pulmonic Valve:  PV Max Velocity: 0.92 m/s PV Max PG: 3.4 mmHg PV Mean PG:    PHYSICIAN INTERPRETATION:  Left Ventricle: The left ventricular internal cavity size was normal. LV   septal wall thickness was normal. LV posterior wall thickness was normal.   Global LV systolic function was normal. Left ventricular ejection   fraction, by visual estimation, is 70 to 75%.  Right Ventricle: The right ventricular size is normal. Global RV systolic   function is normal.  Left Atrium: The left atrium is normal in size.  Right Atrium: The right  atrium is normal in size.  Pericardium: There is no evidence of pericardial effusion.  Mitral Valve: The mitral valve is normal in structure. There ismild     thickening of the anterior and posterior mitral valve leaflets. Mild to   moderate mitral valve regurgitation is seen.  Tricuspid Valve: The tricuspid valve is normal. Trivial tricuspid   regurgitation is visualized. The tricuspid regurgitant velocity is 2.16   m/s, and with an assumed right atrial pressure of 5 mmHg, the estimated   right ventricular systolic pressure is normal at 23.7 mmHg.  Aortic Valve: The aortic valve is normal. No evidence of aortic valve   regurgitation is seen.  Pulmonic Valve: The pulmonic valve is normal.    [place] MD  Electronically signed by [place] MD  Signature Date/Time: 05/12/2014/12:51:15 PM    *** Final ***  IMPRESSION: .        Verified By: Yolonda Kida, M.D., MD    20-Jul-15 10:55, ECG  Ventricular Rate 54  Atrial Rate 54  P-R Interval 140  QRS Duration 100  QT 440  QTc 417  P Axis 47  R Axis 61  T Axis 52  ECG interpretation   Sinus bradycardia  ST & T wave abnormality, consider anterior ischemia  Abnormal ECG  When compared with ECG of 11-May-2014 23:58,  there has been progression of the anterior T wave abnormality.   Confirmed by Grayland Jack [phone]) on 05/13/2014 8:37:09 AM    Overreader: Grayland Jack  ECG    Assessment/Plan:  Assessment/Plan:  Assessment IMP 1  unstable angina 2 coronary artery disease 3 hypertension 4 hyperlipidemia 5 coronary fistula .   Plan PLAN 1  status post cath with complex coronary disease some recommend transfer to [facility] 2 the continue hypertension control 3 hyperlipidemia recommend statin therapy 4 coronary fistula may not be clinically relevant but would have the patient follow-up at the medical center 5 coronary disease so I would recommend intervention either coronary bypass surgery or PCI and stent 6 continue telemetry and transfer as an inpatient to the [facility] 7 continue short-term anticoagulation 8 consider loading with Plavix 9 case discussed with Dr. Raechel Ache at [facility] and he accepts the patient   Electronic Signatures: Lujean Amel D (MD)  (Signed 30-Jul-15 12:59)  Authored: Chief Complaint, VITAL SIGNS/ANCILLARY NOTES, Brief Assessment, Lab Results, Radiology Results, Assessment/Plan   Last Updated: 30-Jul-15 12:59 by Yolonda Kida (MD)

## 2015-02-14 NOTE — Consult Note (Signed)
PATIENT NAME:  Carlos Erickson, Carlos Erickson MR#:  [id number] DATE OF BIRTH:  11-12-1950  DATE OF CONSULTATION:  05/12/2014  REFERRING PHYSICIANS:  Ahmed Prima, MD and Aaron Mose. Hower, MD.  CONSULTING PHYSICIAN:  Maurion Walkowiak D. Clayborn Bigness, MD  PRIMARY CARE PHYSICIAN:  Dr. Edilia Bo.  INDICATION: Unstable angina.   HISTORY OF PRESENT ILLNESS:  The patient is a 64 year old white male with hypertension, smoking, hyperlipidemia.  Family states that over the previous less than 1 month he has had recurrent chest pain symptoms, mostly with exertion, but he has had some at rest at night, midsternal, the last 2-3 days prior to admission it had gotten worse. On the day of admission he was taking out the trash and had 10/10 chest pain, midsternal, radiating to both arms, which brought him to the Emergency Room. Initial EKG in the Emergency Room along with his enzymes were negative, but troponins subsequently were elevated so cardiology consultation was recommended. The patient denies any prior cardiac history.   REVIEW OF SYSTEMS:  No blackout spells or syncope. No nausea or vomiting. No fever, no chills. No sweats. No weight loss. No weight gain. No hemoptysis or hematemesis. Denies bright red blood per rectum. No vision change or hearing change. Denies sputum production. Denies cough.   PAST MEDICAL HISTORY: Again, hypertension gout, hyperlipidemia, smoking.   SOCIAL HISTORY: Smoker. No alcohol consumption. Retired.   FAMILY HISTORY: Coronary artery disease.   ALLERGIES: ASPIRIN AND MORPHINE.   HOME MEDICATIONS:  Atenolol for hypertension.   PHYSICAL EXAMINATION:  VITAL SIGNS: Blood pressure 144/80, pulse 65, respiratory rate 18, afebrile.  HEENT: Normocephalic, atraumatic. Pupils equal and reactive to light.  NECK: Supple. No significant JVD, bruits or adenopathy.  LUNGS: Clear to auscultation and percussion. No significant wheeze, rhonchi, or rale.  HEART: Regular rate and rhythm. Positive bowel sounds. No  rebound, guarding or tenderness.  EXTREMITIES: Normal. Pulses intact. SKIN: Normal.   DIAGNOSTIC STUDIES:  Sodium 136, potassium 3.3, chloride 101, bicarbonate 25, BUN 17, creatinine 1.2, glucose 137, troponin 0.02, second troponin 0.21. White count 9.9, hemoglobin 15.8, platelet count 250,000.   EKG: Normal sinus rhythm, nonspecific ST-T wave changes.  ASSESSMENT: Unstable angina, possible non-Q-wave myocardial infarction, hypokalemia.  Acute coronary syndrome, hypertension, smoking, hyperlipidemia.   PLAN:  Agree with admit. Follow up cardiac enzymes. Follow up EKG. Recommend short-term anticoagulation. If he is unable to take aspirin recommend Plavix therapy. Recommend instituting statin therapy. Continue atenolol for hypertension and consider Imdur for angina.  Would replace potassium. Would recommend cardiac catheterization directly with elevated troponins and significant new anginal symptoms and recommend further evaluation based on a  cardiac catheterization.  Echocardiogram would be helpful as well.   ____________________________ Loran Senters. Clayborn Bigness, MD ddc:lt D: 05/12/2014 13:39:53 ET T: 05/12/2014 14:16:08 ET JOB#: [id number]  cc: Demarian Epps D. Clayborn Bigness, MD, <Dictator> Yolonda Kida MD ELECTRONICALLY SIGNED 06/09/2014 10:22

## 2015-04-20 ENCOUNTER — Other Ambulatory Visit: Payer: Self-pay

## 2015-07-13 ENCOUNTER — Encounter: Payer: Self-pay | Admitting: Cardiovascular Disease

## 2015-07-13 ENCOUNTER — Telehealth: Payer: Self-pay | Admitting: *Deleted

## 2015-07-13 ENCOUNTER — Ambulatory Visit (INDEPENDENT_AMBULATORY_CARE_PROVIDER_SITE_OTHER): Payer: Medicare Other | Admitting: Cardiovascular Disease

## 2015-07-13 VITALS — BP 138/94 | HR 65 | Ht 67.0 in | Wt 186.5 lb

## 2015-07-13 DIAGNOSIS — I10 Essential (primary) hypertension: Secondary | ICD-10-CM

## 2015-07-13 DIAGNOSIS — I2510 Atherosclerotic heart disease of native coronary artery without angina pectoris: Secondary | ICD-10-CM

## 2015-07-13 DIAGNOSIS — E785 Hyperlipidemia, unspecified: Secondary | ICD-10-CM

## 2015-07-13 DIAGNOSIS — G629 Polyneuropathy, unspecified: Secondary | ICD-10-CM | POA: Diagnosis not present

## 2015-07-13 MED ORDER — LOSARTAN POTASSIUM 25 MG PO TABS: 25.0000 mg | ORAL_TABLET | Freq: Every day | ORAL | Status: DC

## 2015-07-13 NOTE — Assessment & Plan Note (Signed)
He is doing well overall with no symptoms suggestive of angina. Continue medical therapy.

## 2015-07-13 NOTE — Telephone Encounter (Signed)
lmov to schedule fasting blood work that needs to be in 1w from today

## 2015-07-13 NOTE — Assessment & Plan Note (Signed)
Continue treatment with atorvastatin with a target LDL of less than 70. I requested fasting lipid and liver profile to be done in one week.

## 2015-07-13 NOTE — Assessment & Plan Note (Signed)
Blood pressure improved but still not at target. Carvedilol cannot be increased given his relatively low heart rate.  Thus, I elected to add small dose losartan 25 mg once daily. Check basic metabolic profile in one week.

## 2015-07-13 NOTE — Progress Notes (Signed)
Primary care physician: Dr. Lorelei Pont  HPI  This is a pleasant 64 year old man who is here today for a follow-up visit regarding coronary artery disease.  He presented to [facility] in August of 2015 with chest pain and was found to have a small non-ST elevation myocardial infarction. He underwent cardiac catheterization  which showed significant complex two-vessel coronary artery disease including the LAD and RCA. He was transferred to [facility] where he underwent CABG. Ejection fraction was normal by echo.  Other medical issues include hypertension and hyperlipidemia.   during last visit, I switched metoprolol to carvedilol for better blood pressure control. His blood pressure continued to be elevated and thus amlodipine was added. Since then he had significant improvement in blood pressure control but still not optimal. Otherwise he has been doing well and denies any chest pain or shortness of breath.  Allergies  Allergen Reactions  . Plasma Protein Fraction Anaphylaxis    Whole body rash, hypotension intra-operatively during CABG, responded to benadryl and famotidine  . Aspirin Hives    Per pt, able to tolerate low dose aspirin.   Marland Kitchen Morphine And Related Other (See Comments)    hallucanations     Current Outpatient Prescriptions on File Prior to Visit  Medication Sig Dispense Refill  . amLODipine (NORVASC) 5 MG tablet Take 1 tablet (5 mg total) by mouth daily. 30 tablet 6  . atorvastatin (LIPITOR) 40 MG tablet Take 1 tablet (40 mg total) by mouth daily. 30 tablet 6  . carvedilol (COREG) 6.25 MG tablet Take 1 tablet (6.25 mg total) by mouth 2 (two) times daily. 60 tablet 6  . clopidogrel (PLAVIX) 75 MG tablet Take 1 tablet (75 mg total) by mouth daily. 30 tablet 5  . lansoprazole (PREVACID) 15 MG capsule Take 15 mg by mouth daily as needed.      No current facility-administered medications on file prior to visit.     Past Medical History  Diagnosis Date  . Arthritis   . GERD (gastroesophageal  reflux disease)   . Hypertension   . Colon polyps   . Past heart attack, 05/11/2014 07/09/2014  . 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 MI (myocardial infarction)   . CAD (coronary artery disease), native coronary artery 05/2014    Non-ST elevation myocardial infarction. Echocardiogram showed normal LV systolic function with mild to moderate mitral regurgitation. Cardiac catheterization showed significant two-vessel coronary artery disease including the RCA and left anterior descending artery. He underwent CABG at [facility].  Marland Kitchen Hyperlipidemia      Past Surgical History  Procedure Laterality Date  . Eye surgery    . Lung biopsy  2006    [facility]  . Hemorroidectomy    . Cardiac catheterization  03/2014    [facility]  . Coronary artery bypass graft  05/2014    [facility], CABG x 2     Family History  Problem Relation Age of Onset  . Stroke Mother   . Hypertension Mother      Social History   Social History  . Marital Status: Married    Spouse Name: N/A  . Number of Children: N/A  . Years of Education: N/A   Occupational History  . retired    Social History Main Topics  . Smoking status: Former Smoker -- 1 years    Types: Cigars  . Smokeless tobacco: Never Used  . Alcohol Use: Yes     Comment: occ beer  . Drug Use: No  . Sexual Activity:  Partners: Female   Other Topics Concern  . Not on file   Social History Narrative     ROS A 10 point review of system was performed. It is negative other than that mentioned in the history of present illness.   PHYSICAL EXAM   BP 138/94 mmHg  Pulse 65  Ht '5\' 7"'$  (1.702 m)  Wt 186 lb 8 oz (84.596 kg)  BMI 29.20 kg/m2 Constitutional: He is oriented to person, place, and time. He appears well-developed and well-nourished. No distress.  HENT: No nasal discharge.  Head: Normocephalic and atraumatic.  Eyes: Pupils are equal and round.  No discharge. Neck: Normal range of motion. Neck supple. No JVD present. No thyromegaly present.  Cardiovascular:  Normal rate, regular rhythm, normal heart sounds. Exam reveals no gallop and no friction rub. No murmur heard.  Pulmonary/Chest: Effort normal and breath sounds normal. No stridor. No respiratory distress. He has no wheezes. He has no rales. He exhibits no tenderness. Surgical scar is healed. Abdominal: Soft. Bowel sounds are normal. He exhibits no distension. There is no tenderness. There is no rebound and no guarding.  Musculoskeletal: Normal range of motion. He exhibits no edema and no tenderness.  Neurological: He is alert and oriented to person, place, and time. Coordination normal.  Skin: Skin is warm and dry. No rash noted. He is not diaphoretic. No erythema. No pallor.  Psychiatric: He has a normal mood and affect. His behavior is normal. Judgment and thought content normal.  Vascular: Radial pulses are normal. Posterior tibial and dorsalis pedis are normal bilaterally.     EKG: Sinus  Rhythm  WITHIN NORMAL LIMITS  ASSESSMENT AND PLAN

## 2015-07-13 NOTE — Patient Instructions (Signed)
Medication Instructions:  Your physician has recommended you make the following change in your medication:  START taking losartan '25mg'$  once per day   Labwork: Your physician recommends that you return for a FASTING lipid and liver profile, Vitamin B12, and BMET in one week. Nothing to eat or drink after midnight the evening before your labs   Testing/Procedures: none  Follow-Up: Your physician wants you to follow-up in: six months with Dr. Fletcher Anon.  You will receive a reminder letter in the mail two months in advance. If you don't receive a letter, please call our office to schedule the follow-up appointment.   Any Other Special Instructions Will Be Listed Below (If Applicable).

## 2015-07-22 ENCOUNTER — Other Ambulatory Visit: Payer: Medicare Other

## 2015-07-22 DIAGNOSIS — I10 Essential (primary) hypertension: Secondary | ICD-10-CM

## 2015-07-22 DIAGNOSIS — E785 Hyperlipidemia, unspecified: Secondary | ICD-10-CM | POA: Diagnosis not present

## 2015-07-22 DIAGNOSIS — G629 Polyneuropathy, unspecified: Secondary | ICD-10-CM

## 2015-07-23 LAB — LIPID PANEL
CHOL/HDL RATIO: 3.5 ratio (ref 0.0–5.0)
Cholesterol, Total: 144 mg/dL (ref 100–199)
HDL: 41 mg/dL (ref >39)
LDL CALC: 62 mg/dL (ref 0–99)
TRIGLYCERIDES: 205 mg/dL — AB (ref 0–149)
VLDL Cholesterol Cal: 41 mg/dL — ABNORMAL HIGH (ref 5–40)

## 2015-07-23 LAB — BASIC METABOLIC PANEL
BUN/Creatinine Ratio: 12 (ref 10–22)
BUN: 12 mg/dL (ref 8–27)
CALCIUM: 9.4 mg/dL (ref 8.6–10.2)
CO2: 21 mmol/L (ref 18–29)
Chloride: 98 mmol/L (ref 97–108)
Creatinine, Ser: 1.02 mg/dL (ref 0.76–1.27)
GFR calc non Af Amer: 77 mL/min/{1.73_m2} (ref >59)
GFR, EST AFRICAN AMERICAN: 89 mL/min/{1.73_m2} (ref >59)
Glucose: 98 mg/dL (ref 65–99)
POTASSIUM: 4.4 mmol/L (ref 3.5–5.2)
Sodium: 138 mmol/L (ref 134–144)

## 2015-07-23 LAB — HEPATIC FUNCTION PANEL
ALK PHOS: 99 IU/L (ref 39–117)
ALT: 19 IU/L (ref 0–44)
AST: 19 IU/L (ref 0–40)
Albumin: 4.2 g/dL (ref 3.6–4.8)
BILIRUBIN, DIRECT: 0.18 mg/dL (ref 0.00–0.40)
Bilirubin Total: 0.7 mg/dL (ref 0.0–1.2)
Total Protein: 6.9 g/dL (ref 6.0–8.5)

## 2015-07-23 LAB — VITAMIN B12: Vitamin B-12: 392 pg/mL (ref 211–946)

## 2015-07-24 ENCOUNTER — Other Ambulatory Visit: Payer: Self-pay | Admitting: Cardiovascular Disease

## 2015-08-21 ENCOUNTER — Other Ambulatory Visit: Payer: Self-pay | Admitting: Cardiovascular Disease

## 2015-08-28 DIAGNOSIS — H2512 Age-related nuclear cataract, left eye: Secondary | ICD-10-CM | POA: Diagnosis not present

## 2015-08-28 DIAGNOSIS — H34232 Retinal artery branch occlusion, left eye: Secondary | ICD-10-CM | POA: Diagnosis not present

## 2015-08-28 DIAGNOSIS — Z961 Presence of intraocular lens: Secondary | ICD-10-CM | POA: Diagnosis not present

## 2015-11-17 ENCOUNTER — Other Ambulatory Visit: Payer: Self-pay | Admitting: Cardiovascular Disease

## 2015-11-18 ENCOUNTER — Ambulatory Visit (INDEPENDENT_AMBULATORY_CARE_PROVIDER_SITE_OTHER): Payer: Medicare Other

## 2015-11-18 DIAGNOSIS — Z23 Encounter for immunization: Secondary | ICD-10-CM

## 2016-01-01 ENCOUNTER — Other Ambulatory Visit: Payer: Self-pay | Admitting: Cardiovascular Disease

## 2016-01-12 ENCOUNTER — Encounter: Payer: Self-pay | Admitting: Cardiovascular Disease

## 2016-01-12 ENCOUNTER — Ambulatory Visit (INDEPENDENT_AMBULATORY_CARE_PROVIDER_SITE_OTHER): Payer: Medicare Other | Admitting: Cardiovascular Disease

## 2016-01-12 VITALS — BP 140/98 | HR 63 | Ht 67.0 in | Wt 194.2 lb

## 2016-01-12 DIAGNOSIS — G4730 Sleep apnea, unspecified: Secondary | ICD-10-CM

## 2016-01-12 DIAGNOSIS — I2510 Atherosclerotic heart disease of native coronary artery without angina pectoris: Secondary | ICD-10-CM

## 2016-01-12 NOTE — Patient Instructions (Signed)
Medication Instructions:  Your physician recommends that you continue on your current medications as directed. Please refer to the Current Medication list given to you today.   Labwork: none  Testing/Procedures: none  Follow-Up: Your physician wants you to follow-up in: six months with Dr. Fletcher Anon. You will receive a reminder letter in the mail two months in advance. If you don't receive a letter, please call our office to schedule the follow-up appointment.   Any Other Special Instructions Will Be Listed Below (If Applicable). Pulmonary referral for sleep apnea    If you need a refill on your cardiac medications before your next appointment, please call your pharmacy.

## 2016-01-12 NOTE — Progress Notes (Signed)
Cardiology Office Note   Date:  01/12/2016   ID:  Carlos Erickson, DOB 1951/08/23, MRN [id number]  PCP:  Owens Loffler, MD  Cardiologist:   Kathlyn Sacramento, MD   Chief Complaint  Patient presents with  . other    6 month f/u c/o restless at bedtime. Meds reviewed verbally with pt.      History of Present Illness: Carlos Erickson is a 65 y.o. male who presents for a follow-up visit regarding coronary artery disease.  He presented to [facility] in August of 2015 with chest pain and was found to have a small non-ST elevation myocardial infarction. He underwent cardiac catheterization  which showed significant complex two-vessel coronary artery disease including the LAD and RCA. He was transferred to [facility] where he underwent CABG. Ejection fraction was normal by echo.  Other medical issues include hypertension and hyperlipidemia.  He has been doing reasonably well and denies any chest pain or shortness of breath. He hasn't been very active and he gained another 4 pounds. He reports gaining 20 pounds since his bypass surgery. He reports poor quality sleep with inability to sleep more than 4 hours. He has to take frequent naps during the day especially if he is watching TV. He feels that he has no energy. His wife does not sleep in the same room and he is not aware of sleep apnea. He has not been tested for that.   Past Medical History  Diagnosis Date  . Arthritis   . GERD (gastroesophageal reflux disease)   . Hypertension   . Colon polyps   . Past heart attack, 05/11/2014 07/09/2014  . 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 MI (myocardial infarction) ([place])   . CAD (coronary artery disease), native coronary artery 05/2014    Non-ST elevation myocardial infarction. Echocardiogram showed normal LV systolic function with mild to moderate mitral regurgitation. Cardiac catheterization showed significant two-vessel coronary artery disease including the RCA and left anterior descending artery. He underwent CABG at  [facility].  Marland Kitchen Hyperlipidemia     Past Surgical History  Procedure Laterality Date  . Eye surgery    . Lung biopsy  2006    [facility]  . Hemorroidectomy    . Cardiac catheterization  03/2014    [facility]  . Coronary artery bypass graft  05/2014    [facility], CABG x 2     Current Outpatient Prescriptions  Medication Sig Dispense Refill  . amLODipine (NORVASC) 5 MG tablet TAKE ONE TABLET BY MOUTH ONCE DAILY 30 tablet 3  . atorvastatin (LIPITOR) 40 MG tablet Take 1 tablet (40 mg total) by mouth daily. 30 tablet 6  . carvedilol (COREG) 6.25 MG tablet TAKE ONE TABLET BY MOUTH TWICE DAILY 60 tablet 10  . clopidogrel (PLAVIX) 75 MG tablet TAKE ONE TABLET BY MOUTH ONCE DAILY 30 tablet 10  . lansoprazole (PREVACID) 15 MG capsule Take 15 mg by mouth daily as needed.     Marland Kitchen LORazepam (ATIVAN) 1 MG tablet Take 1 mg by mouth at bedtime.    Marland Kitchen losartan (COZAAR) 25 MG tablet TAKE ONE TABLET BY MOUTH ONCE DAILY 30 tablet 6  . Melatonin 5 MG CAPS Take by mouth daily.     No current facility-administered medications for this visit.    Allergies:   Plasma protein fraction; Aspirin; and Morphine and related    Social History:  The patient  reports that he has quit smoking. His smoking use included Cigars. He has never used smokeless tobacco.  He reports that he drinks alcohol. He reports that he does not use illicit drugs.   Family History:  The patient's family history includes Hypertension in his mother; Stroke in his mother.    ROS:  Please see the history of present illness.   Otherwise, review of systems are positive for none.   All other systems are reviewed and negative.    PHYSICAL EXAM: VS:  BP 140/98 mmHg  Pulse 63  Ht '5\' 7"'$  (1.702 m)  Wt 194 lb 4 oz (88.111 kg)  BMI 30.42 kg/m2 , BMI Body mass index is 30.42 kg/(m^2). GEN: Well nourished, well developed, in no acute distress HEENT: normal Neck: no JVD, carotid bruits, or masses Cardiac: RRR; no murmurs, rubs, or gallops,no edema  Respiratory:  clear  to auscultation bilaterally, normal work of breathing GI: soft, nontender, nondistended, + BS MS: no deformity or atrophy Skin: warm and dry, no rash Neuro:  Strength and sensation are intact Psych: euthymic mood, full affect   EKG:  EKG is ordered today. The ekg ordered today demonstrates normal sinus rhythm with no significant ST or T wave changes.   Recent Labs: 07/22/2015: ALT 19; BUN 12; Creatinine, Ser 1.02; Potassium 4.4; Sodium 138    Lipid Panel    Component Value Date/Time   CHOL 144 07/22/2015 1036   CHOL 184 05/12/2014 0256   CHOL 236* 05/22/2013 0825   TRIG 205* 07/22/2015 1036   TRIG 567* 05/12/2014 0256   HDL 41 07/22/2015 1036   HDL 26* 05/12/2014 0256   HDL 33.50* 05/22/2013 0825   CHOLHDL 3.5 07/22/2015 1036   CHOLHDL 7 05/22/2013 0825   VLDL SEE COMMENT 05/12/2014 0256   VLDL 68.8* 05/22/2013 0825   LDLCALC 62 07/22/2015 1036   LDLCALC SEE COMMENT 05/12/2014 0256   LDLDIRECT 145.5 05/22/2013 0825      Wt Readings from Last 3 Encounters:  01/12/16 194 lb 4 oz (88.111 kg)  07/13/15 186 lb 8 oz (84.596 kg)  01/05/15 187 lb 4 oz (84.936 kg)         ASSESSMENT AND PLAN:  1.  Coronary artery disease involving native coronary arteries without angina: The patient is overall doing well with no anginal symptoms. He continues to gain weight and I had a prolonged discussion with him about the importance of healthy lifestyle changes. Continue same medications.  2. Hypertension: Blood pressure improved significantly on carvedilol, losartan and amlodipine. It is mildly elevated today but that has not been the trend at home. Continue to monitor for now and if needed we can increase the dose of losartan.  3. Hyperlipidemia: Continue treatment with atorvastatin with a target LDL of less than 70. Most recent lipid profile in September showed an LDL of 62.  4. Possible sleep apnea: The patient has symptoms highly suggestive of sleep apnea and thus I'm referring  him to pulmonary for evaluation.     Disposition:   FU with me in 6 months  Signed,  Kathlyn Sacramento, MD  01/12/2016 2:26 PM    [place]

## 2016-01-13 ENCOUNTER — Telehealth: Payer: Self-pay | Admitting: Internal Medicine

## 2016-01-13 NOTE — Telephone Encounter (Signed)
Please schedule pt on 01-22-16 @ 8:20am to see Dr. Mortimer Fries. Thanks.

## 2016-01-13 NOTE — Telephone Encounter (Signed)
This patient has a referral for a sleep study from Dr. Fletcher Anon. Not sure where to put this patient?

## 2016-01-22 ENCOUNTER — Ambulatory Visit: Payer: Medicare Other | Admitting: Internal Medicine

## 2016-03-01 ENCOUNTER — Other Ambulatory Visit: Payer: Self-pay | Admitting: *Deleted

## 2016-03-01 MED ORDER — ATORVASTATIN CALCIUM 40 MG PO TABS: 40.0000 mg | ORAL_TABLET | Freq: Every day | ORAL | Status: DC

## 2016-03-01 NOTE — Telephone Encounter (Signed)
Requested Prescriptions   Signed Prescriptions Disp Refills  . atorvastatin (LIPITOR) 40 MG tablet 90 tablet 3    Sig: Take 1 tablet (40 mg total) by mouth daily.    Authorizing Provider: Kathlyn Sacramento A    Ordering User: Britt Bottom

## 2016-03-17 ENCOUNTER — Other Ambulatory Visit: Payer: Self-pay | Admitting: Cardiovascular Disease

## 2016-07-04 ENCOUNTER — Ambulatory Visit (INDEPENDENT_AMBULATORY_CARE_PROVIDER_SITE_OTHER): Payer: Medicare Other | Admitting: Family Medicine

## 2016-07-04 ENCOUNTER — Encounter: Payer: Self-pay | Admitting: Family Medicine

## 2016-07-04 VITALS — BP 126/80 | HR 66 | Temp 97.6°F | Ht 68.0 in | Wt 191.4 lb

## 2016-07-04 DIAGNOSIS — Z0000 Encounter for general adult medical examination without abnormal findings: Secondary | ICD-10-CM

## 2016-07-04 DIAGNOSIS — K219 Gastro-esophageal reflux disease without esophagitis: Secondary | ICD-10-CM | POA: Diagnosis not present

## 2016-07-04 DIAGNOSIS — Z0001 Encounter for general adult medical examination with abnormal findings: Secondary | ICD-10-CM | POA: Diagnosis not present

## 2016-07-04 DIAGNOSIS — R739 Hyperglycemia, unspecified: Secondary | ICD-10-CM

## 2016-07-04 DIAGNOSIS — M2550 Pain in unspecified joint: Secondary | ICD-10-CM

## 2016-07-04 DIAGNOSIS — Z1211 Encounter for screening for malignant neoplasm of colon: Secondary | ICD-10-CM

## 2016-07-04 DIAGNOSIS — K2270 Barrett's esophagus without dysplasia: Secondary | ICD-10-CM

## 2016-07-04 DIAGNOSIS — E785 Hyperlipidemia, unspecified: Secondary | ICD-10-CM | POA: Diagnosis not present

## 2016-07-04 DIAGNOSIS — M19042 Primary osteoarthritis, left hand: Secondary | ICD-10-CM

## 2016-07-04 DIAGNOSIS — R1310 Dysphagia, unspecified: Secondary | ICD-10-CM | POA: Diagnosis not present

## 2016-07-04 DIAGNOSIS — M19041 Primary osteoarthritis, right hand: Secondary | ICD-10-CM

## 2016-07-04 DIAGNOSIS — Z79899 Other long term (current) drug therapy: Secondary | ICD-10-CM | POA: Diagnosis not present

## 2016-07-04 DIAGNOSIS — Z125 Encounter for screening for malignant neoplasm of prostate: Secondary | ICD-10-CM

## 2016-07-04 DIAGNOSIS — Z23 Encounter for immunization: Secondary | ICD-10-CM

## 2016-07-04 DIAGNOSIS — W57XXXA Bitten or stung by nonvenomous insect and other nonvenomous arthropods, initial encounter: Secondary | ICD-10-CM | POA: Diagnosis not present

## 2016-07-04 DIAGNOSIS — G4700 Insomnia, unspecified: Secondary | ICD-10-CM | POA: Diagnosis not present

## 2016-07-04 DIAGNOSIS — M65341 Trigger finger, right ring finger: Secondary | ICD-10-CM

## 2016-07-04 DIAGNOSIS — T148 Other injury of unspecified body region: Secondary | ICD-10-CM

## 2016-07-04 DIAGNOSIS — Z8669 Personal history of other diseases of the nervous system and sense organs: Secondary | ICD-10-CM

## 2016-07-04 HISTORY — DX: Barrett's esophagus without dysplasia: K22.70

## 2016-07-04 LAB — BASIC METABOLIC PANEL
BUN: 15 mg/dL (ref 6–23)
CO2: 29 mEq/L (ref 19–32)
Calcium: 9 mg/dL (ref 8.4–10.5)
Chloride: 104 mEq/L (ref 96–112)
Creatinine, Ser: 0.98 mg/dL (ref 0.40–1.50)
GFR: 81.2 mL/min (ref >60.00)
GLUCOSE: 108 mg/dL — AB (ref 70–99)
Potassium: 4.8 mEq/L (ref 3.5–5.1)
Sodium: 140 mEq/L (ref 135–145)

## 2016-07-04 LAB — CBC WITH DIFFERENTIAL/PLATELET
Basophils Absolute: 0 10*3/uL (ref 0.0–0.1)
Basophils Relative: 0.4 % (ref 0.0–3.0)
Eosinophils Absolute: 0.3 10*3/uL (ref 0.0–0.7)
Eosinophils Relative: 4.5 % (ref 0.0–5.0)
HCT: 46.7 % (ref 39.0–52.0)
Hemoglobin: 15.8 g/dL (ref 13.0–17.0)
Lymphocytes Relative: 30 % (ref 12.0–46.0)
Lymphs Abs: 2 10*3/uL (ref 0.7–4.0)
MCHC: 33.8 g/dL (ref 30.0–36.0)
MCV: 92.2 fl (ref 78.0–100.0)
Monocytes Absolute: 0.7 10*3/uL (ref 0.1–1.0)
Monocytes Relative: 10.3 % (ref 3.0–12.0)
Neutro Abs: 3.7 10*3/uL (ref 1.4–7.7)
Neutrophils Relative %: 54.8 % (ref 43.0–77.0)
Platelets: 252 10*3/uL (ref 150.0–400.0)
RBC: 5.07 Mil/uL (ref 4.22–5.81)
RDW: 13.6 % (ref 11.5–15.5)
WBC: 6.7 10*3/uL (ref 4.0–10.5)

## 2016-07-04 LAB — LIPID PANEL
Cholesterol: 129 mg/dL (ref 0–200)
HDL: 39.2 mg/dL (ref >39.00)
LDL Cholesterol: 68 mg/dL (ref 0–99)
NONHDL: 90.13
Total CHOL/HDL Ratio: 3
Triglycerides: 109 mg/dL (ref 0.0–149.0)
VLDL: 21.8 mg/dL (ref 0.0–40.0)

## 2016-07-04 LAB — HEMOGLOBIN A1C: Hgb A1c MFr Bld: 5.8 % (ref 4.6–6.5)

## 2016-07-04 LAB — HEPATIC FUNCTION PANEL
ALBUMIN: 4.2 g/dL (ref 3.5–5.2)
ALT: 27 U/L (ref 0–53)
AST: 18 U/L (ref 0–37)
Alkaline Phosphatase: 74 U/L (ref 39–117)
Bilirubin, Direct: 0.1 mg/dL (ref 0.0–0.3)
Total Bilirubin: 0.7 mg/dL (ref 0.2–1.2)
Total Protein: 6.9 g/dL (ref 6.0–8.3)

## 2016-07-04 LAB — PSA: PSA: 0.81 ng/mL (ref 0.10–4.00)

## 2016-07-04 MED ORDER — TRAZODONE HCL 50 MG PO TABS: 25.0000 mg | ORAL_TABLET | Freq: Every evening | ORAL | 5 refills | Status: DC | PRN

## 2016-07-04 NOTE — Progress Notes (Signed)
Pre visit review using our clinic review tool, if applicable. No additional management support is needed unless otherwise documented below in the visit note. 

## 2016-07-04 NOTE — Progress Notes (Signed)
Dr. Frederico Hamman T. Jabre Heo, MD, [facility] [place], [address] Phone: [phone] Fax: [phone]  07/04/2016  Patient: Carlos Erickson, MRN: [id number], DOB: August 20, 1951, 65 y.o.  Primary Physician:  Owens Loffler, MD   Chief Complaint  Patient presents with  . Medicare Wellness   Subjective:   BAKARY BRAMER is a 65 y.o. pleasant patient who presents for a medicare wellness examination and routine physical exam. Additionally, he had more than 10 ongoing medical concerns that he wanted help with today.  Preventative Health Maintenance Visit:  Health Maintenance Summary Reviewed and updated, unless pt declines services.  Tobacco History Reviewed. Alcohol: No concerns, no excessive use Exercise Habits: Some activity, rec at least 30 mins 5 times a week STD concerns: no risk or activity to increase risk Drug Use: None Encouraged self-testicular check  Chronic insomnia: Can't sleep - sleep cycle got messed up back when he was working. Not sleeping well. Has been taking some lorazepam at night.  OTC - slow and grounchy. (like anti-H) Melatonin 10 mg did not seem to help.   Had some Barrett's esophagus -  Was seeing Dr. Lajoyce Corners (worked with [facility].) This not has been evaluated in approximately 10 years.  > 10 years since colonoscopy and EGD. At least 8 years.   Also gets some dysphagia. This is with various types of food.  Borderline DM - hhe has used a home glucometer and had a blood sugar of about 120.  He checked a home hemoglobin A1c, and this was in the 6 range.  ? Eyes are blurry in the AM ? At this point is has resolved.  The patient also has chronic hand arthritis, notably in the DIP joints as well as the MCP joints.  He brings this up, wants me to examine this, and has some questions about possible recommendations.  Patient also has a trigger finger on his fourth digit on the right.  This  does not really bother him all that much, and does not really cause significant pain.  4/15 - had a tick bite.  Never had fever, chills.  hhe wonders if he could have Lyme disease.  He also sometimes has a pain underneath his left ribs when he is  Mowing the yard.  This is occurred since his sternotomy for his CABG procedure.  R 4th digit trigger finger   Health Maintenance  Topic Date Due  . Hepatitis C Screening  1951-03-10  . TETANUS/TDAP  12/25/1968  . COLONOSCOPY  12/26/1999  . ZOSTAVAX  12/25/2009  . PNA vac Low Risk Adult (2 of 2 - PPSV23) 11/17/2016  . INFLUENZA VACCINE  Completed    Immunization History  Administered Date(s) Administered  . Influenza, High Dose Seasonal PF 09/03/2015  . Influenza,inj,Quad PF,36+ Mos 07/09/2014, 07/04/2016  . Pneumococcal Conjugate-13 11/18/2015    Patient Active Problem List   Diagnosis Date Noted  . Past heart attack, 05/11/2014 07/09/2014    Priority: High  . 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 CAD (coronary artery disease), native coronary artery 07/09/2014    Priority: High  . Barrett's esophagus 07/04/2016  . Peripheral neuropathy ([place]) 01/05/2015  . Acute maxillary sinusitis 10/03/2014  . Hyperlipidemia   . Unilateral recurrent femoral hernia without obstruction or gangrene 07/10/2014  . Hypertension   . GERD (gastroesophageal reflux disease)   . Arthritis    Past Medical History:  Diagnosis Date  .  Arthritis   . Barrett's esophagus 07/04/2016  . CAD (coronary artery disease), native coronary artery 05/2014   Non-ST elevation myocardial infarction. Echocardiogram showed normal LV systolic function with mild to moderate mitral regurgitation. Cardiac catheterization showed significant two-vessel coronary artery disease including the RCA and left anterior descending artery. He underwent CABG at [facility].  . Colon polyps   . GERD (gastroesophageal reflux disease)   . Hyperlipidemia   . Hypertension   . MI  (myocardial infarction) ([place])   . Past heart attack, 05/11/2014 07/09/2014  . S/P CABG x 2 07/09/2014   Past Surgical History:  Procedure Laterality Date  . CARDIAC CATHETERIZATION  03/2014   [facility]  . CORONARY ARTERY BYPASS GRAFT  05/2014   [facility], CABG x 2  . EYE SURGERY    . HEMORROIDECTOMY    . LUNG BIOPSY  2006   [facility]   Social History   Social History  . Marital status: Married    Spouse name: N/A  . Number of children: N/A  . Years of education: N/A   Occupational History  . retired    Social History Main Topics  . Smoking status: Former Smoker    Years: 1.00    Types: Cigars  . Smokeless tobacco: Never Used  . Alcohol use Yes     Comment: occ beer  . Drug use: No  . Sexual activity: Yes    Partners: Female   Other Topics Concern  . Not on file   Social History Narrative  . No narrative on file   Family History  Problem Relation Age of Onset  . Stroke Mother   . Hypertension Mother    Allergies  Allergen Reactions  . Plasma Protein Fraction Anaphylaxis    Whole body rash, hypotension intra-operatively during CABG, responded to benadryl and famotidine  . Aspirin Hives    Per pt, able to tolerate low dose aspirin.   Marland Kitchen Morphine And Related Other (See Comments)    hallucanations    Medication list has been reviewed and updated.   General: Denies fever, chills, sweats. No significant weight loss. Eyes: AS ABOVE ENT: Denies earache, sore throat, and hoarseness. Cardiovascular: Denies chest pains, palpitations, dyspnea on exertion Respiratory: Denies cough, dyspnea at rest,wheeezing Breast: no concerns about lumps GI: AS ABOVE GU: Denies penile discharge, ED, urinary flow / outflow problems. No STD concerns. Musculoskeletal: AS ABOVE Derm: Denies rash, itching Neuro: Denies  paresthesias, frequent falls, frequent headaches Psych: Denies depression, anxiety Endocrine: Denies cold intolerance, heat intolerance, polydipsia Heme: Denies enlarged lymph  nodes Allergy: No hayfever  Objective:   BP 126/80   Pulse 66   Temp 97.6 F (36.4 C) (Oral)   Ht _0  (1.727 m)   Wt 191 lb 6 oz (86.8 kg)   BMI 29.10 kg/m   The patient completed a fall screen and PHQ-2 and PHQ-9 if necessary, which is documented in the EHR. The CMA/LPN/RN who assisted the patient verbally completed with them and documented results in [place].   Hearing Screening   Method: Audiometry   _1  _2  _3  _4  _5  _6  _7  _8  _9   Right ear:   20 25 0  0    Left ear:   20  20 0  0    Vision Screening Comments: Wears Glasses-Eye Exam with Dr. Katy Fitch 08/2015  GEN: well developed, well nourished, no acute distress Eyes: conjunctiva and lids normal, PERRLA, EOMI ENT: TM clear, nares clear, oral exam WNL Neck: supple, no  lymphadenopathy, no thyromegaly, no JVD Pulm: clear to auscultation and percussion, respiratory effort normal CV: regular rate and rhythm, S1-S2, no murmur, rub or gallop, no bruits, peripheral pulses normal and symmetric, no cyanosis, clubbing, edema or varicosities GI: soft, non-tender; no hepatosplenomegaly, masses; active bowel sounds all quadrants GU: no hernia, testicular mass, penile discharge Lymph: no cervical, axillary or inguinal adenopathy MSK: Diffuse hand osteoarthritis, as well as CMC joint arthritis bilaterally, hands.  SKIN: clear, good turgor, color WNL, no rashes, lesions, or ulcerations Neuro: normal mental status, normal strength, sensation, and motion Psych: alert; oriented to person, place and time, normally interactive and not anxious or depressed in appearance.  All labs reviewed with patient.  Lipids:    Component Value Date/Time   CHOL 129 07/04/2016 1023   CHOL 144 07/22/2015 1036   CHOL 184 05/12/2014 0256   TRIG 109.0 07/04/2016 1023   TRIG 567 (H) 05/12/2014 0256   HDL 39.20 07/04/2016 1023   HDL 41 07/22/2015 1036   HDL 26 (L) 05/12/2014 0256   LDLDIRECT 145.5 05/22/2013 0825    VLDL 21.8 07/04/2016 1023   VLDL SEE COMMENT 05/12/2014 0256   CHOLHDL 3 07/04/2016 1023   CBC: CBC Latest Ref Rng & Units 07/04/2016 05/11/2014 03/05/2014  WBC 4.0 - 10.5 K/uL 6.7 9.9 9.0  Hemoglobin 13.0 - 17.0 g/dL 15.8 15.8 17.0  Hematocrit 39.0 - 52.0 % 46.7 46.0 46.7  Platelets 150.0 - 400.0 K/uL 252.0 250 885    Basic Metabolic Panel:    Component Value Date/Time   NA 140 07/04/2016 1023   NA 138 07/22/2015 1036   NA 138 05/12/2014 1234   K 4.8 07/04/2016 1023   K 3.3 (L) 05/12/2014 1234   CL 104 07/04/2016 1023   CL 102 05/12/2014 1234   CO2 29 07/04/2016 1023   CO2 28 05/12/2014 1234   BUN 15 07/04/2016 1023   BUN 12 07/22/2015 1036   BUN 21 (H) 05/12/2014 1234   CREATININE 0.98 07/04/2016 1023   CREATININE 0.94 05/12/2014 1234   GLUCOSE 108 (H) 07/04/2016 1023   GLUCOSE 108 (H) 05/12/2014 1234   CALCIUM 9.0 07/04/2016 1023   CALCIUM 8.3 (L) 05/12/2014 1234   Hepatic Function Latest Ref Rng & Units 07/04/2016 07/22/2015 08/28/2014  Total Protein 6.0 - 8.3 g/dL 6.9 6.9 7.1  Albumin 3.5 - 5.2 g/dL 4.2 4.2 4.3  AST 0 - 37 U/L _0 ALT 0 - 53 U/L _1 Alk Phosphatase 39 - 117 U/L 74 99 88  Total Bilirubin 0.2 - 1.2 mg/dL 0.7 0.7 0.5  Bilirubin, Direct 0.0 - 0.3 mg/dL 0.1 0.18 0.14    No results found for: TSH Lab Results  Component Value Date   PSA 0.81 07/04/2016   PSA 0.57 05/22/2013    Assessment and Plan:   Healthcare maintenance  Need for prophylactic vaccination and inoculation against influenza - Plan: Flu Vaccine QUAD 36+ mos IM  Barrett's esophagus without dysplasia - Plan: Ambulatory referral to Gastroenterology None Barrett's esophagus as well as overdue for colonoscopy and active dysphagia. Consult GI.  Encounter for screening colonoscopy - Plan: Ambulatory referral to Gastroenterology  Dysphagia - Plan: Ambulatory referral to Gastroenterology  Hyperglycemia - Plan: Basic metabolic panel, Hemoglobin A1c The patient has concerns  that he has diabetes, and I actively counseled him regarding healthy eating. He does not have diabetes, and his hemoglobin A1c is normal.  Encounter for long-term (current) use of medications - Plan: CBC with Differential/Platelet,  Hepatic function panel  Hyperlipidemia LDL goal <70 - Plan: Lipid panel  Screening PSA (prostate specific antigen) - Plan: PSA  Tick bite - Plan: Lyme Ab/Western Blot Reflex Patient has multiple aches and pains as well as he had a tick bite 6 months ago. He is concerned that he could potentially have contracted Lyme disease. We will check for such.  Polyarthralgia - Plan: Lyme Ab/Western Blot Reflex  Trigger finger, right ring finger Right-sided fourth digit trigger finger. At this point and is not causing in that much pain, and I did advise him that if it did ever then we could inject his care finger, and if that failed then operative intervention would be reasonable.  Insomnia Chronic insomnia, failure of over-the-counter medications as well as melatonin and Ativan. Trial of trazodone.  Primary osteoarthritis of both hands Reassurance, discussed arthritis and arthritis management in general.  Gastroesophageal reflux disease, esophagitis presence not specified  Hx of blurred vision Resolved.  >15 minutes spent in face to face time with patient, >50% spent in counselling or coordination of care: All spent over and above health maintenance and Medicare wellness in discussion of the multiple problems detailed below. Well more than 50% of time spent in counseling. Details on each problem listed above.  Health Maintenance Exam: The patient's preventative maintenance and recommended screening tests for an annual wellness exam were reviewed in full today. Brought up to date unless services declined.  Counselled on the importance of diet, exercise, and its role in overall health and mortality. The patient's FH and SH was reviewed, including their home life,  tobacco status, and drug and alcohol status.  I have personally reviewed the Medicare Annual Wellness questionnaire and have noted 1. The patient's medical and social history 2. Their use of alcohol, tobacco or illicit drugs 3. Their current medications and supplements 4. The patient's functional ability including ADL's, fall risks, home safety risks and hearing or visual             impairment. 5. Diet and physical activities 6. Evidence for depression or mood disorders 7. Reviewed Updated provider list, see scanned forms and CH[facility] Snapshot.   The patients weight, height, BMI and visual acuity have been recorded in the chart I have made referrals, counseling and provided education to the patient based review of the above and I have provided the pt with a written personalized care plan for preventive services.  I have provided the patient with a copy of your personalized plan for preventive services. Instructed to take the time to review along with their updated medication list.  Follow-up: No Follow-up on file. Or follow-up in 1 year for complete physical examination  New Prescriptions   TRAZODONE (DESYREL) 50 MG TABLET    Take 0.5-1 tablets (25-50 mg total) by mouth at bedtime as needed for sleep.   Orders Placed This Encounter  Procedures  . Flu Vaccine QUAD 36+ mos IM  . Basic metabolic panel  . CBC with Differential/Platelet  . Hepatic function panel  . Hemoglobin A1c  . Lipid panel  . Lyme Ab/Western Blot Reflex  . PSA  . Ambulatory referral to Gastroenterology    Signed,  Frederico Hamman T. Tamarra Geiselman, MD   Patient's Medications  New Prescriptions   TRAZODONE (DESYREL) 50 MG TABLET    Take 0.5-1 tablets (25-50 mg total) by mouth at bedtime as needed for sleep.  Previous Medications   AMLODIPINE (NORVASC) 5 MG TABLET    TAKE ONE TABLET BY MOUTH ONCE DAILY  ATORVASTATIN (LIPITOR) 40 MG TABLET    Take 1 tablet (40 mg total) by mouth daily.   CLOPIDOGREL (PLAVIX) 75 MG TABLET     TAKE ONE TABLET BY MOUTH ONCE DAILY   LANSOPRAZOLE (PREVACID) 15 MG CAPSULE    Take 15 mg by mouth daily as needed.    LORAZEPAM (ATIVAN) 1 MG TABLET    Take 1 mg by mouth at bedtime.   LOSARTAN (COZAAR) 25 MG TABLET    TAKE ONE TABLET BY MOUTH ONCE DAILY   MELATONIN 5 MG CAPS    Take by mouth daily.  Modified Medications   No medications on file  Discontinued Medications   CARVEDILOL (COREG) 6.25 MG TABLET    TAKE ONE TABLET BY MOUTH TWICE DAILY

## 2016-07-05 LAB — LYME AB/WESTERN BLOT REFLEX

## 2016-07-06 DIAGNOSIS — I2510 Atherosclerotic heart disease of native coronary artery without angina pectoris: Secondary | ICD-10-CM | POA: Diagnosis not present

## 2016-07-06 DIAGNOSIS — K219 Gastro-esophageal reflux disease without esophagitis: Secondary | ICD-10-CM | POA: Diagnosis not present

## 2016-07-06 DIAGNOSIS — R1310 Dysphagia, unspecified: Secondary | ICD-10-CM | POA: Diagnosis not present

## 2016-07-06 DIAGNOSIS — Z1211 Encounter for screening for malignant neoplasm of colon: Secondary | ICD-10-CM | POA: Diagnosis not present

## 2016-07-07 ENCOUNTER — Telehealth: Payer: Self-pay | Admitting: Cardiovascular Disease

## 2016-07-07 NOTE — Telephone Encounter (Signed)
Received request from Dr. Carol Ada @ [facility] for pt to stop plavix 5 days prior to 9/26 EGD/colonoscopy. Request in MD basket.

## 2016-07-08 NOTE — Telephone Encounter (Signed)
Faxed instructions to stop plavix to [facility], [phone]

## 2016-07-14 ENCOUNTER — Encounter: Payer: Self-pay | Admitting: Cardiovascular Disease

## 2016-07-14 ENCOUNTER — Ambulatory Visit (INDEPENDENT_AMBULATORY_CARE_PROVIDER_SITE_OTHER): Payer: Medicare Other

## 2016-07-14 ENCOUNTER — Ambulatory Visit (INDEPENDENT_AMBULATORY_CARE_PROVIDER_SITE_OTHER): Payer: Medicare Other | Admitting: Cardiovascular Disease

## 2016-07-14 VITALS — BP 138/78 | HR 65 | Ht 68.0 in | Wt 194.5 lb

## 2016-07-14 DIAGNOSIS — E785 Hyperlipidemia, unspecified: Secondary | ICD-10-CM

## 2016-07-14 DIAGNOSIS — Z01818 Encounter for other preprocedural examination: Secondary | ICD-10-CM | POA: Diagnosis not present

## 2016-07-14 DIAGNOSIS — R079 Chest pain, unspecified: Secondary | ICD-10-CM | POA: Diagnosis not present

## 2016-07-14 DIAGNOSIS — I10 Essential (primary) hypertension: Secondary | ICD-10-CM | POA: Diagnosis not present

## 2016-07-14 DIAGNOSIS — I2510 Atherosclerotic heart disease of native coronary artery without angina pectoris: Secondary | ICD-10-CM

## 2016-07-14 LAB — EXERCISE TOLERANCE TEST
CHL CUP MPHR: 154 {beats}/min
CHL CUP RESTING HR STRESS: 63 {beats}/min
CSEPEW: 9.9 METS
CSEPPHR: 141 {beats}/min
Exercise duration (min): 7 min
Exercise duration (sec): 56 s
Percent HR: 91 %

## 2016-07-14 NOTE — Patient Instructions (Signed)
Medication Instructions:  Your physician recommends that you continue on your current medications as directed. Please refer to the Current Medication list given to you today.   Labwork: none  Testing/Procedures: Your physician has requested that you have an exercise tolerance test. For further information please visit [email or web address]. Please also follow instruction sheet, as given.    Follow-Up: Your physician wants you to follow-up in: 6 months with Dr. Fletcher Anon.  You will receive a reminder letter in the mail two months in advance. If you don't receive a letter, please call our office to schedule the follow-up appointment.   Any Other Special Instructions Will Be Listed Below (If Applicable).     If you need a refill on your cardiac medications before your next appointment, please call your pharmacy.

## 2016-07-14 NOTE — Progress Notes (Signed)
Cardiology Office Note   Date:  07/14/2016   ID:  Carlos Erickson, DOB [birth year], MRN [id number]  PCP:  Owens Loffler, MD  Cardiologist:   Kathlyn Sacramento, MD   Chief Complaint  Patient presents with  . other    6 month f/u c/o rib cage discomfort. Meds reviewed verbally with pt.      History of Present Illness: Carlos Erickson is a 65 y.o. male who presents for a follow-up visit regarding coronary artery disease.  He presented to [facility] in August of 2015 with chest pain and was found to have a small non-ST elevation myocardial infarction. He underwent cardiac catheterization  which showed significant complex two-vessel coronary artery disease including the LAD and RCA. He was transferred to [facility] where he underwent CABG. Ejection fraction was normal by echo.  Other medical issues include hypertension and hyperlipidemia.  The patient reported poor quality sleep and gradual weight gain during his last visit. He was referred to pulmonary for evaluation of sleep apnea but he did not want to have a sleep study done due to inability to afford it. He has been doing reasonably well. He reports left sided chest pain described as aching and cramping which happens when he is mowing the lawn or fishing. He thinks it's musculoskeletal. The pain does not radiate. No associated symptoms.  Past Medical History:  Diagnosis Date  . Arthritis   . Barrett's esophagus 07/04/2016  . CAD (coronary artery disease), native coronary artery 05/2014   Non-ST elevation myocardial infarction. Echocardiogram showed normal LV systolic function with mild to moderate mitral regurgitation. Cardiac catheterization showed significant two-vessel coronary artery disease including the RCA and left anterior descending artery. He underwent CABG at [facility].  . Colon polyps   . GERD (gastroesophageal reflux disease)   . Hyperlipidemia   . Hypertension   . MI (myocardial infarction) ([place])   . Past heart attack, 05/11/2014  07/09/2014  . 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 CARDIAC CATHETERIZATION  03/2014   [facility]  . CORONARY ARTERY BYPASS GRAFT  05/2014   [facility], CABG x 2  . EYE SURGERY    . HEMORROIDECTOMY    . LUNG BIOPSY  2006   [facility]     Current Outpatient Prescriptions  Medication Sig Dispense Refill  . amLODipine (NORVASC) 5 MG tablet TAKE ONE TABLET BY MOUTH ONCE DAILY 30 tablet 6  . atorvastatin (LIPITOR) 40 MG tablet Take 1 tablet (40 mg total) by mouth daily. 90 tablet 3  . clopidogrel (PLAVIX) 75 MG tablet TAKE ONE TABLET BY MOUTH ONCE DAILY 30 tablet 10  . lansoprazole (PREVACID) 15 MG capsule Take 15 mg by mouth daily as needed.     Marland Kitchen losartan (COZAAR) 25 MG tablet TAKE ONE TABLET BY MOUTH ONCE DAILY 30 tablet 6  . Melatonin 5 MG CAPS Take by mouth daily.    . traZODone (DESYREL) 50 MG tablet Take 0.5-1 tablets (25-50 mg total) by mouth at bedtime as needed for sleep. 30 tablet 5   No current facility-administered medications for this visit.     Allergies:   Plasma protein fraction; Aspirin; and Morphine and related    Social History:  The patient  reports that he has quit smoking. His smoking use included Cigars. He quit after 1.00 year of use. He has never used smokeless tobacco. He reports that he drinks alcohol. He reports that he does not use drugs.  Family History:  The patient's family history includes Hypertension in his mother; Stroke in his mother.    ROS:  Please see the history of present illness.   Otherwise, review of systems are positive for none.   All other systems are reviewed and negative.    PHYSICAL EXAM: VS:  BP 138/78 (BP Location: Left Arm, Patient Position: Sitting, Cuff Size: Normal)   Pulse 65   Ht '5\' 8"'$  (1.727 m)   Wt 194 lb 8 oz (88.2 kg)   BMI 29.57 kg/m  , BMI Body mass index is 29.57 kg/m. GEN: Well nourished, well developed, in no acute distress HEENT: normal Neck: no JVD, carotid bruits, or  masses Cardiac: RRR; no murmurs, rubs, or gallops,no edema  Respiratory:  clear to auscultation bilaterally, normal work of breathing GI: soft, nontender, nondistended, + BS MS: no deformity or atrophy Skin: warm and dry, no rash Neuro:  Strength and sensation are intact Psych: euthymic mood, full affect   EKG:  EKG is ordered today. The ekg ordered today demonstrates normal sinus rhythm with no significant ST or T wave changes.   Recent Labs: 07/04/2016: ALT 27; BUN 15; Creatinine, Ser 0.98; Hemoglobin 15.8; Platelets 252.0; Potassium 4.8; Sodium 140    Lipid Panel    Component Value Date/Time   CHOL 129 07/04/2016 1023   CHOL 144 07/22/2015 1036   CHOL 184 05/12/2014 0256   TRIG 109.0 07/04/2016 1023   TRIG 567 (H) 05/12/2014 0256   HDL 39.20 07/04/2016 1023   HDL 41 07/22/2015 1036   HDL 26 (L) 05/12/2014 0256   CHOLHDL 3 07/04/2016 1023   VLDL 21.8 07/04/2016 1023   VLDL SEE COMMENT 05/12/2014 0256   LDLCALC 68 07/04/2016 1023   LDLCALC 62 07/22/2015 1036   LDLCALC SEE COMMENT 05/12/2014 0256   LDLDIRECT 145.5 05/22/2013 0825      Wt Readings from Last 3 Encounters:  07/14/16 194 lb 8 oz (88.2 kg)  07/04/16 191 lb 6 oz (86.8 kg)  01/12/16 194 lb 4 oz (88.1 kg)         ASSESSMENT AND PLAN:  1.  Coronary artery disease involving native coronary arteries : Atypical chest pain could be musculoskeletal but the association with exertion is somewhat concerning. His baseline ECG is normal. I requested a treadmill stress test.  2. Hypertension:  Blood pressure is well controlled on current medications.  3. Hyperlipidemia: Continue treatment with atorvastatin with a target LDL of less than 70. Most recent lipid profile in September showed an LDL of 68 with normal triglyceride.  4. Pre-op evaluation for EGD/colonoscopy: Treadmill stress test is ordered due to atypical chest pain. Plavix can be held 5 days before procedure.    Disposition:   FU with me in 6  months  Signed,  Kathlyn Sacramento, MD  07/14/2016 10:29 AM    [place]

## 2016-07-15 ENCOUNTER — Telehealth: Payer: Self-pay | Admitting: Cardiovascular Disease

## 2016-07-15 NOTE — Telephone Encounter (Signed)
Pt would like stress test results. Please call. 

## 2016-07-19 DIAGNOSIS — R1310 Dysphagia, unspecified: Secondary | ICD-10-CM | POA: Diagnosis not present

## 2016-07-19 DIAGNOSIS — K635 Polyp of colon: Secondary | ICD-10-CM | POA: Diagnosis not present

## 2016-07-19 DIAGNOSIS — K208 Other esophagitis: Secondary | ICD-10-CM | POA: Diagnosis not present

## 2016-07-19 DIAGNOSIS — D123 Benign neoplasm of transverse colon: Secondary | ICD-10-CM | POA: Diagnosis not present

## 2016-07-19 DIAGNOSIS — K6389 Other specified diseases of intestine: Secondary | ICD-10-CM | POA: Diagnosis not present

## 2016-07-19 DIAGNOSIS — Z1211 Encounter for screening for malignant neoplasm of colon: Secondary | ICD-10-CM | POA: Diagnosis not present

## 2016-07-19 DIAGNOSIS — K209 Esophagitis, unspecified: Secondary | ICD-10-CM | POA: Diagnosis not present

## 2016-07-19 DIAGNOSIS — D122 Benign neoplasm of ascending colon: Secondary | ICD-10-CM | POA: Diagnosis not present

## 2016-07-19 DIAGNOSIS — D124 Benign neoplasm of descending colon: Secondary | ICD-10-CM | POA: Diagnosis not present

## 2016-07-19 DIAGNOSIS — D120 Benign neoplasm of cecum: Secondary | ICD-10-CM | POA: Diagnosis not present

## 2016-07-20 ENCOUNTER — Other Ambulatory Visit: Payer: Self-pay | Admitting: Cardiovascular Disease

## 2016-07-20 NOTE — Telephone Encounter (Signed)
Can you review for refill? I do not see medication on the pt's med list.

## 2016-07-24 ENCOUNTER — Other Ambulatory Visit: Payer: Self-pay | Admitting: Cardiovascular Disease

## 2016-07-29 ENCOUNTER — Other Ambulatory Visit: Payer: Self-pay | Admitting: Cardiovascular Disease

## 2016-08-20 ENCOUNTER — Other Ambulatory Visit: Payer: Self-pay | Admitting: Cardiovascular Disease

## 2016-08-22 DIAGNOSIS — K449 Diaphragmatic hernia without obstruction or gangrene: Secondary | ICD-10-CM | POA: Diagnosis not present

## 2016-08-22 DIAGNOSIS — K208 Other esophagitis: Secondary | ICD-10-CM | POA: Diagnosis not present

## 2016-08-22 DIAGNOSIS — Z86010 Personal history of colonic polyps: Secondary | ICD-10-CM | POA: Diagnosis not present

## 2016-08-31 DIAGNOSIS — H2512 Age-related nuclear cataract, left eye: Secondary | ICD-10-CM | POA: Diagnosis not present

## 2016-08-31 DIAGNOSIS — Z961 Presence of intraocular lens: Secondary | ICD-10-CM | POA: Diagnosis not present

## 2016-08-31 DIAGNOSIS — H40011 Open angle with borderline findings, low risk, right eye: Secondary | ICD-10-CM | POA: Diagnosis not present

## 2016-08-31 DIAGNOSIS — H53431 Sector or arcuate defects, right eye: Secondary | ICD-10-CM | POA: Diagnosis not present

## 2016-08-31 DIAGNOSIS — H31091 Other chorioretinal scars, right eye: Secondary | ICD-10-CM | POA: Diagnosis not present

## 2016-09-21 DIAGNOSIS — H9121 Sudden idiopathic hearing loss, right ear: Secondary | ICD-10-CM | POA: Diagnosis not present

## 2016-10-03 ENCOUNTER — Telehealth: Payer: Self-pay | Admitting: Family Medicine

## 2016-10-03 MED ORDER — TRAMADOL HCL 50 MG PO TABS: 50.0000 mg | ORAL_TABLET | Freq: Four times a day (QID) | ORAL | 0 refills | Status: DC | PRN

## 2016-10-03 NOTE — Telephone Encounter (Signed)
My understanding of dental trauma is non-existent. I would do exactly what they say.   Typically, you do not need to stop plavix for 1 dental extraction.

## 2016-10-03 NOTE — Telephone Encounter (Signed)
Carlos Erickson notified as instructed by telephone.  She states they have an appointment with the oral surgeon tomorrow.  Would like Dr. Lorelei Pont to prescribe something for pain until seen tomorrow.  Taking OTC pain relievers without relief.  Please Advise.

## 2016-10-03 NOTE — Telephone Encounter (Signed)
Patient's wife called and spoke to Barbera Setters and wants to know what they should do?

## 2016-10-03 NOTE — Telephone Encounter (Signed)
Tramadol called into [facility] on [address].  I ask the pharmacy to let Mr. Carlos Erickson know it is okay to combine OTC tylenol or OTC ibuprofen with the Tramadol.

## 2016-10-03 NOTE — Telephone Encounter (Signed)
Spoke with Carlos Erickson.  Carlos Erickson thought he should be okay with taking Tramadol.  Please advise.

## 2016-10-03 NOTE — Telephone Encounter (Signed)
Tramadol 50 mg, 1 po q 6 hours prn pain, #20, 0 refills   Can combine with OTC Tylenol or OTC Ibuprofen

## 2016-10-03 NOTE — Telephone Encounter (Signed)
Patient Name: CHALMERS IDDINGS  DOB: [birth year]    Initial Comment Caller states husband bit a chocolate nut candy, loosened his tooth, on Plavix, off yesterday for prednizone for ear virus, and BP: 179/115 high. She would like to know what to do next.    Nurse Assessment  Nurse: Raphael Gibney, RN, Vanita Ingles Date/Time (Eastern Time): 10/03/2016 9:03:53 AM  Confirm and document reason for call. If symptomatic, describe symptoms. ---Caller states spouse bit into a chocolate nut candy which caused his tooth to loosen. He takes plavix. He is having a lot of pain in his tooth. Having pain on the right side of his tooth. No swelling. No bleeding. BP 179/115. Took his antihypertensives about an hr ago. Finished prednisone for virus in his ear yesterday.  Does the patient have any new or worsening symptoms? ---Yes  Will a triage be completed? ---Yes  Related visit to physician within the last 2 weeks? ---No  Does the PT have any chronic conditions? (i.e. diabetes, asthma, etc.) ---Yes  List chronic conditions. ---HTN; MI  Is this a behavioral health or substance abuse call? ---No     Guidelines    Guideline Title Affirmed Question Affirmed Notes  Tooth Injury [1] SEVERE pain AND [2] not improved 2 hours after pain medicine/ice    Final Disposition User   See Physician within 4 Hours (or PCP triage) Raphael Gibney, RN, Vera    Comments  Spouse states she has already called the dentist who does not pull teeth and recommended that she take him to [facility] who does tooth extractions. Spouse wants to know if she should take him there today or should he stop his plavix before going and also needs something for pain. Please call spouse back.  having severe pain on the right side of his face. OTC medication is not helping   Referrals  GO TO FACILITY UNDECIDED   Disagree/Comply: Comply

## 2016-10-03 NOTE — Telephone Encounter (Signed)
Morphine "and others" listed under allergy list.   Verbally ask what he can take. The only thing I can call in is codeine or tramadol.

## 2016-10-18 ENCOUNTER — Other Ambulatory Visit: Payer: Self-pay | Admitting: Cardiovascular Disease

## 2016-10-25 DIAGNOSIS — H903 Sensorineural hearing loss, bilateral: Secondary | ICD-10-CM | POA: Diagnosis not present

## 2016-10-25 DIAGNOSIS — H9121 Sudden idiopathic hearing loss, right ear: Secondary | ICD-10-CM | POA: Diagnosis not present

## 2017-01-02 ENCOUNTER — Other Ambulatory Visit: Payer: Self-pay

## 2017-01-02 MED ORDER — LOSARTAN POTASSIUM 25 MG PO TABS: 25.0000 mg | ORAL_TABLET | Freq: Every day | ORAL | 0 refills | Status: DC

## 2017-01-03 ENCOUNTER — Other Ambulatory Visit: Payer: Self-pay | Admitting: Cardiovascular Disease

## 2017-02-02 ENCOUNTER — Other Ambulatory Visit: Payer: Self-pay | Admitting: Cardiovascular Disease

## 2017-02-07 DIAGNOSIS — D485 Neoplasm of uncertain behavior of skin: Secondary | ICD-10-CM | POA: Diagnosis not present

## 2017-02-07 DIAGNOSIS — D225 Melanocytic nevi of trunk: Secondary | ICD-10-CM | POA: Diagnosis not present

## 2017-02-07 DIAGNOSIS — L821 Other seborrheic keratosis: Secondary | ICD-10-CM | POA: Diagnosis not present

## 2017-02-13 ENCOUNTER — Other Ambulatory Visit: Payer: Self-pay | Admitting: Cardiovascular Disease

## 2017-02-14 ENCOUNTER — Encounter: Payer: Self-pay | Admitting: Cardiovascular Disease

## 2017-02-14 ENCOUNTER — Ambulatory Visit (INDEPENDENT_AMBULATORY_CARE_PROVIDER_SITE_OTHER): Payer: Medicare Other | Admitting: Cardiovascular Disease

## 2017-02-14 VITALS — BP 120/78 | HR 58 | Ht 68.0 in | Wt 190.5 lb

## 2017-02-14 DIAGNOSIS — E785 Hyperlipidemia, unspecified: Secondary | ICD-10-CM | POA: Diagnosis not present

## 2017-02-14 DIAGNOSIS — I2510 Atherosclerotic heart disease of native coronary artery without angina pectoris: Secondary | ICD-10-CM | POA: Diagnosis not present

## 2017-02-14 DIAGNOSIS — I10 Essential (primary) hypertension: Secondary | ICD-10-CM

## 2017-02-14 NOTE — Progress Notes (Signed)
Cardiology Office Note   Date:  02/14/2017   ID:  Carlos Erickson, DOB 12/12/1950, MRN [id number]  PCP:  Owens Loffler, MD  Cardiologist:   Kathlyn Sacramento, MD   Chief Complaint  Patient presents with  . other    6 month f/u no complaints today. Meds reviewed verbally with pt.      History of Present Illness: Carlos Erickson is a 66 y.o. male who presents for a follow-up visit regarding coronary artery disease.  He presented to [facility] in August of 2015 with chest pain and was found to have a small non-ST elevation myocardial infarction. He underwent cardiac catheterization  which showed significant complex two-vessel coronary artery disease including the LAD and RCA. He was transferred to [facility] where he underwent CABG. Ejection fraction was normal by echo.  Other medical issues include hypertension and hyperlipidemia.  The patient has suspected sleep apnea but could not get a sleep study done due to cost. He has been doing well from a cardiac standpoint with no chest pain or shortness of breath. He did have atypical chest pain during last visit. A treadmill stress test was done in September and was normal.   Past Medical History:  Diagnosis Date  . Arthritis   . Barrett's esophagus 07/04/2016  . CAD (coronary artery disease), native coronary artery 05/2014   Non-ST elevation myocardial infarction. Echocardiogram showed normal LV systolic function with mild to moderate mitral regurgitation. Cardiac catheterization showed significant two-vessel coronary artery disease including the RCA and left anterior descending artery. He underwent CABG at [facility].  . Colon polyps   . GERD (gastroesophageal reflux disease)   . Hyperlipidemia   . Hypertension   . MI (myocardial infarction) ([place])   . Past heart attack, 05/11/2014 07/09/2014  . 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 CARDIAC CATHETERIZATION  03/2014   [facility]  . CORONARY ARTERY BYPASS GRAFT   05/2014   [facility], CABG x 2  . EYE SURGERY    . HEMORROIDECTOMY    . LUNG BIOPSY  2006   [facility]     Current Outpatient Prescriptions  Medication Sig Dispense Refill  . amLODipine (NORVASC) 5 MG tablet TAKE ONE TABLET BY MOUTH ONCE DAILY 90 tablet 3  . atorvastatin (LIPITOR) 40 MG tablet Take 1 tablet (40 mg total) by mouth daily. 90 tablet 3  . carvedilol (COREG) 6.25 MG tablet TAKE ONE TABLET BY MOUTH TWICE DAILY 60 tablet 0  . clopidogrel (PLAVIX) 75 MG tablet TAKE 1 TABLET BY MOUTH ONCE DAILY. MUST HAVE OFFICE VISIT BEFORE FURTHER REFILLS. 30 tablet 0  . lansoprazole (PREVACID) 15 MG capsule Take 15 mg by mouth daily as needed.     Marland Kitchen losartan (COZAAR) 25 MG tablet Take 1 tablet (25 mg total) by mouth daily. 30 tablet 0  . Melatonin 5 MG CAPS Take by mouth daily.    . traMADol (ULTRAM) 50 MG tablet Take 1 tablet (50 mg total) by mouth every 6 (six) hours as needed. 20 tablet 0   No current facility-administered medications for this visit.     Allergies:   Plasma protein fraction; Aspirin; and Morphine and related    Social History:  The patient  reports that he has quit smoking. His smoking use included Cigars. He quit after 1.00 year of use. He has never used smokeless tobacco. He reports that he drinks alcohol. He reports that he does not use drugs.  Family History:  The patient's family history includes Hypertension in his mother; Stroke in his mother.    ROS:  Please see the history of present illness.   Otherwise, review of systems are positive for none.   All other systems are reviewed and negative.    PHYSICAL EXAM: VS:  BP 120/78 (BP Location: Left Arm, Patient Position: Sitting, Cuff Size: Normal)   Pulse (!) 58   Ht '5\' 8"'$  (1.727 m)   Wt 190 lb 8 oz (86.4 kg)   BMI 28.97 kg/m  , BMI Body mass index is 28.97 kg/m. GEN: Well nourished, well developed, in no acute distress  HEENT: normal  Neck: no JVD, carotid bruits, or masses Cardiac: RRR; no murmurs, rubs, or  gallops,no edema  Respiratory:  clear to auscultation bilaterally, normal work of breathing GI: soft, nontender, nondistended, + BS MS: no deformity or atrophy  Skin: warm and dry, no rash Neuro:  Strength and sensation are intact Psych: euthymic mood, full affect   EKG:  EKG is ordered today. The ekg ordered today demonstrates sinus bradycardia with no significant ST or T wave changes.   Recent Labs: 07/04/2016: ALT 27; BUN 15; Creatinine, Ser 0.98; Hemoglobin 15.8; Platelets 252.0; Potassium 4.8; Sodium 140    Lipid Panel    Component Value Date/Time   CHOL 129 07/04/2016 1023   CHOL 144 07/22/2015 1036   CHOL 184 05/12/2014 0256   TRIG 109.0 07/04/2016 1023   TRIG 567 (H) 05/12/2014 0256   HDL 39.20 07/04/2016 1023   HDL 41 07/22/2015 1036   HDL 26 (L) 05/12/2014 0256   CHOLHDL 3 07/04/2016 1023   VLDL 21.8 07/04/2016 1023   VLDL SEE COMMENT 05/12/2014 0256   LDLCALC 68 07/04/2016 1023   LDLCALC 62 07/22/2015 1036   LDLCALC SEE COMMENT 05/12/2014 0256   LDLDIRECT 145.5 05/22/2013 0825      Wt Readings from Last 3 Encounters:  02/14/17 190 lb 8 oz (86.4 kg)  07/14/16 194 lb 8 oz (88.2 kg)  07/04/16 191 lb 6 oz (86.8 kg)         ASSESSMENT AND PLAN:  1.  Coronary artery disease involving native coronary arteries :  He is doing well overall with no anginal symptoms. Continue medical therapy. He is on long-term Plavix instead of aspirin due to allergy. He had rash with aspirin before.  2. Hypertension:  Blood pressure is well controlled on current medications.  3. Hyperlipidemia: Continue treatment with atorvastatin with a target LDL of less than 70. Most recent lipid profile in September showed an LDL of 68 with normal triglyceride.  4. Suspected sleep apnea: It seems that a lot of his symptoms of daytime fatigue and poor quality sleep was likely due to sleep apnea. Unfortunately, he could not get a sleep study done due to cost. I discussed with him the  importance of regular exercise and trying to lose some weight.  Disposition:   FU with me in 12 months  Signed,  Kathlyn Sacramento, MD  02/14/2017 3:18 PM    [place]

## 2017-02-14 NOTE — Patient Instructions (Signed)
Medication Instructions: Continue same medications.   Labwork: None.   Procedures/Testing: None.   Follow-Up: 1 year with Dr. Charliee Krenz.   Any Additional Special Instructions Will Be Listed Below (If Applicable).     If you need a refill on your cardiac medications before your next appointment, please call your pharmacy.   

## 2017-02-25 ENCOUNTER — Other Ambulatory Visit: Payer: Self-pay | Admitting: Cardiovascular Disease

## 2017-03-28 ENCOUNTER — Other Ambulatory Visit: Payer: Self-pay | Admitting: Cardiovascular Disease

## 2017-05-30 ENCOUNTER — Other Ambulatory Visit: Payer: Self-pay | Admitting: Cardiovascular Disease

## 2017-06-14 ENCOUNTER — Other Ambulatory Visit: Payer: Self-pay

## 2017-06-14 MED ORDER — LOSARTAN POTASSIUM 25 MG PO TABS: 25.0000 mg | ORAL_TABLET | Freq: Every day | ORAL | 3 refills | Status: DC

## 2017-06-14 MED ORDER — CARVEDILOL 6.25 MG PO TABS
6.2500 mg | ORAL_TABLET | Freq: Two times a day (BID) | ORAL | 3 refills | Status: DC
Start: 2017-06-14 — End: 2018-05-17

## 2017-06-14 MED ORDER — CLOPIDOGREL BISULFATE 75 MG PO TABS: ORAL_TABLET | ORAL | 3 refills | Status: DC

## 2017-06-14 MED ORDER — ATORVASTATIN CALCIUM 40 MG PO TABS: 40.0000 mg | ORAL_TABLET | Freq: Every day | ORAL | 3 refills | Status: DC

## 2017-06-16 ENCOUNTER — Other Ambulatory Visit: Payer: Self-pay

## 2017-06-16 MED ORDER — AMLODIPINE BESYLATE 5 MG PO TABS: 5.0000 mg | ORAL_TABLET | Freq: Every day | ORAL | 3 refills | Status: DC

## 2017-08-31 DIAGNOSIS — H04123 Dry eye syndrome of bilateral lacrimal glands: Secondary | ICD-10-CM | POA: Diagnosis not present

## 2017-08-31 DIAGNOSIS — H01021 Squamous blepharitis right upper eyelid: Secondary | ICD-10-CM | POA: Diagnosis not present

## 2017-08-31 DIAGNOSIS — H2512 Age-related nuclear cataract, left eye: Secondary | ICD-10-CM | POA: Diagnosis not present

## 2017-08-31 DIAGNOSIS — H01024 Squamous blepharitis left upper eyelid: Secondary | ICD-10-CM | POA: Diagnosis not present

## 2017-08-31 DIAGNOSIS — H01022 Squamous blepharitis right lower eyelid: Secondary | ICD-10-CM | POA: Diagnosis not present

## 2017-11-02 DIAGNOSIS — H6121 Impacted cerumen, right ear: Secondary | ICD-10-CM | POA: Diagnosis not present

## 2017-11-02 DIAGNOSIS — H6063 Unspecified chronic otitis externa, bilateral: Secondary | ICD-10-CM | POA: Diagnosis not present

## 2017-11-02 DIAGNOSIS — H903 Sensorineural hearing loss, bilateral: Secondary | ICD-10-CM | POA: Diagnosis not present

## 2018-01-17 ENCOUNTER — Telehealth: Payer: Self-pay | Admitting: Cardiovascular Disease

## 2018-01-17 NOTE — Telephone Encounter (Signed)
Patient wife Carlos Erickson would like to know if patient can have labs drawn during appointment with Dr Fletcher Anon tomorrow  Order would need to be placed Please call to advise

## 2018-01-17 NOTE — Telephone Encounter (Signed)
Spoke with patient's wife, Bertram Millard, who is concerned that patient has not had labs in over a year. Advised to discuss at appointment tomorrow and labs can be drawn in office.

## 2018-01-18 ENCOUNTER — Encounter: Payer: Self-pay | Admitting: Cardiovascular Disease

## 2018-01-18 ENCOUNTER — Ambulatory Visit: Payer: Medicare Other | Admitting: Cardiovascular Disease

## 2018-01-18 VITALS — BP 142/84 | HR 56 | Ht 68.0 in | Wt 179.5 lb

## 2018-01-18 DIAGNOSIS — Z79899 Other long term (current) drug therapy: Secondary | ICD-10-CM | POA: Diagnosis not present

## 2018-01-18 DIAGNOSIS — I10 Essential (primary) hypertension: Secondary | ICD-10-CM

## 2018-01-18 DIAGNOSIS — I2510 Atherosclerotic heart disease of native coronary artery without angina pectoris: Secondary | ICD-10-CM | POA: Diagnosis not present

## 2018-01-18 DIAGNOSIS — E7800 Pure hypercholesterolemia, unspecified: Secondary | ICD-10-CM | POA: Diagnosis not present

## 2018-01-18 DIAGNOSIS — Z125 Encounter for screening for malignant neoplasm of prostate: Secondary | ICD-10-CM

## 2018-01-18 MED ORDER — LOSARTAN POTASSIUM 50 MG PO TABS: 50.0000 mg | ORAL_TABLET | Freq: Every day | ORAL | 3 refills | Status: DC

## 2018-01-18 NOTE — Patient Instructions (Signed)
Medication Instructions:  Your physician has recommended you make the following change in your medication:  INCREASE losartan to 50mg  once daily    Labwork: BMET, CBC, Lipid and liver profile, PSA  Testing/Procedures: none  Follow-Up: Your physician wants you to follow-up in: 1 year with Dr. Fletcher Anon.  You will receive a reminder letter in the mail two months in advance. If you don't receive a letter, please call our office to schedule the follow-up appointment.   Any Other Special Instructions Will Be Listed Below (If Applicable).     If you need a refill on your cardiac medications before your next appointment, please call your pharmacy.

## 2018-01-18 NOTE — Progress Notes (Signed)
Cardiology Office Note   Date:  01/18/2018   ID:  Carlos Erickson, DOB 1951-10-04, MRN [id number]  PCP:  Owens Loffler, MD  Cardiologist:   Kathlyn Sacramento, MD   Chief Complaint  Patient presents with  . Other    12 month follow up. Patient denies chest pain and SOB. Meds reviewed verbally with patient.       History of Present Illness: Carlos Erickson is a 67 y.o. male who presents for a follow-up visit regarding coronary artery disease.  He had non-ST elevation myocardial infarction in August 2015.  Cardiac catheterization showed significant complex two-vessel coronary artery disease including the LAD and RCA. He underwent CABG at [facility].  Ejection fraction was normal by echo.  Other medical issues include hypertension and hyperlipidemia.  The patient has suspected sleep apnea but could not get a sleep study done due to cost.  Treadmill stress test in 2017 was normal.  He has been doing well and denies any chest pain, shortness of breath, palpitations or dizziness.  He takes his medications regularly.  Has not had any routine labs in more than a year.   Past Medical History:  Diagnosis Date  . Arthritis   . Barrett's esophagus 07/04/2016  . CAD (coronary artery disease), native coronary artery 05/2014   Non-ST elevation myocardial infarction. Echocardiogram showed normal LV systolic function with mild to moderate mitral regurgitation. Cardiac catheterization showed significant two-vessel coronary artery disease including the RCA and left anterior descending artery. He underwent CABG at [facility].  . Colon polyps   . GERD (gastroesophageal reflux disease)   . Hyperlipidemia   . Hypertension   . MI (myocardial infarction) ([place])   . Past heart attack, 05/11/2014 07/09/2014  . 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 CARDIAC CATHETERIZATION  03/2014   [facility]  . CORONARY ARTERY BYPASS GRAFT  05/2014   [facility], CABG x 2  . EYE SURGERY    .  HEMORROIDECTOMY    . LUNG BIOPSY  2006   [facility]     Current Outpatient Medications  Medication Sig Dispense Refill  . amLODipine (NORVASC) 5 MG tablet Take 1 tablet (5 mg total) by mouth daily. 90 tablet 3  . atorvastatin (LIPITOR) 40 MG tablet Take 1 tablet (40 mg total) by mouth daily. 90 tablet 3  . carvedilol (COREG) 6.25 MG tablet Take 1 tablet (6.25 mg total) by mouth 2 (two) times daily. 180 tablet 3  . clopidogrel (PLAVIX) 75 MG tablet TAKE 1 TABLET BY MOUTH ONCE DAILY. MUST HAVE OFFICE VISIT BEFORE FURTHER REFILLS. 90 tablet 3  . lansoprazole (PREVACID) 15 MG capsule Take 15 mg by mouth daily as needed.     Marland Kitchen losartan (COZAAR) 25 MG tablet Take 1 tablet (25 mg total) by mouth daily. 90 tablet 3   No current facility-administered medications for this visit.     Allergies:   Plasma protein fraction; Aspirin; and Morphine and related    Social History:  The patient  reports that he has quit smoking. His smoking use included cigars. He quit after 1.00 year of use. He has never used smokeless tobacco. He reports that he drinks alcohol. He reports that he does not use drugs.   Family History:  The patient's family history includes Hypertension in his mother; Stroke in his mother.    ROS:  Please see the history of present illness.   Otherwise, review of systems are positive  for none.   All other systems are reviewed and negative.    PHYSICAL EXAM: VS:  BP (!) 142/84 (BP Location: Left Arm, Patient Position: Sitting, Cuff Size: Normal)   Pulse (!) 56   Ht 5\' 8"  (1.727 m)   Wt 179 lb 8 oz (81.4 kg)   BMI 27.29 kg/m  , BMI Body mass index is 27.29 kg/m. GEN: Well nourished, well developed, in no acute distress  HEENT: normal  Neck: no JVD, carotid bruits, or masses Cardiac: RRR; no murmurs, rubs, or gallops,no edema  Respiratory:  clear to auscultation bilaterally, normal work of breathing GI: soft, nontender, nondistended, + BS MS: no deformity or atrophy  Skin: warm and  dry, no rash Neuro:  Strength and sensation are intact Psych: euthymic mood, full affect   EKG:  EKG is ordered today. The ekg ordered today demonstrates sinus bradycardia with no significant ST or T wave changes.   Recent Labs: No results found for requested labs within last 8760 hours.    Lipid Panel    Component Value Date/Time   CHOL 129 07/04/2016 1023   CHOL 144 07/22/2015 1036   CHOL 184 05/12/2014 0256   TRIG 109.0 07/04/2016 1023   TRIG 567 (H) 05/12/2014 0256   HDL 39.20 07/04/2016 1023   HDL 41 07/22/2015 1036   HDL 26 (L) 05/12/2014 0256   CHOLHDL 3 07/04/2016 1023   VLDL 21.8 07/04/2016 1023   VLDL SEE COMMENT 05/12/2014 0256   LDLCALC 68 07/04/2016 1023   LDLCALC 62 07/22/2015 1036   LDLCALC SEE COMMENT 05/12/2014 0256   LDLDIRECT 145.5 05/22/2013 0825      Wt Readings from Last 3 Encounters:  01/18/18 179 lb 8 oz (81.4 kg)  02/14/17 190 lb 8 oz (86.4 kg)  07/14/16 194 lb 8 oz (88.2 kg)         ASSESSMENT AND PLAN:  1.  Coronary artery disease involving native coronary arteries :  He is doing well overall with no anginal symptoms. Continue medical therapy. He is on long-term Plavix instead of aspirin due to allergy.   2. Hypertension:  Blood pressure is mildly elevated.  He reports elevated blood pressure at home as well.  Thus, I increased losartan to 50 mg daily.  3. Hyperlipidemia: Continue treatment with atorvastatin with a target LDL of less than 70.  I requested a follow-up lipid and liver profile.  4.  Health maintenance: He has not had a physical this year.  I will add CBC and PSA to his routine labs today.   Disposition:   FU with me in 12 months  Signed,  Kathlyn Sacramento, MD  01/18/2018 11:44 AM    [place]

## 2018-01-19 LAB — BASIC METABOLIC PANEL
BUN/Creatinine Ratio: 17 (ref 10–24)
BUN: 17 mg/dL (ref 8–27)
CO2: 25 mmol/L (ref 20–29)
Calcium: 9.4 mg/dL (ref 8.6–10.2)
Chloride: 104 mmol/L (ref 96–106)
Creatinine, Ser: 1.03 mg/dL (ref 0.76–1.27)
GFR calc Af Amer: 86 mL/min/{1.73_m2} (ref >59)
GFR, EST NON AFRICAN AMERICAN: 74 mL/min/{1.73_m2} (ref >59)
Glucose: 96 mg/dL (ref 65–99)
POTASSIUM: 4.3 mmol/L (ref 3.5–5.2)
SODIUM: 142 mmol/L (ref 134–144)

## 2018-01-19 LAB — CBC
Hematocrit: 46.5 % (ref 37.5–51.0)
Hemoglobin: 15.4 g/dL (ref 13.0–17.7)
MCH: 31 pg (ref 26.6–33.0)
MCHC: 33.1 g/dL (ref 31.5–35.7)
MCV: 94 fL (ref 79–97)
Platelets: 242 10*3/uL (ref 150–379)
RBC: 4.96 x10E6/uL (ref 4.14–5.80)
RDW: 13.8 % (ref 12.3–15.4)
WBC: 6.2 10*3/uL (ref 3.4–10.8)

## 2018-01-19 LAB — HEPATIC FUNCTION PANEL
ALT: 17 IU/L (ref 0–44)
AST: 16 IU/L (ref 0–40)
Albumin: 4.4 g/dL (ref 3.6–4.8)
Alkaline Phosphatase: 71 IU/L (ref 39–117)
Bilirubin Total: 0.9 mg/dL (ref 0.0–1.2)
Bilirubin, Direct: 0.26 mg/dL (ref 0.00–0.40)
Total Protein: 7 g/dL (ref 6.0–8.5)

## 2018-01-19 LAB — LIPID PANEL
CHOL/HDL RATIO: 3 ratio (ref 0.0–5.0)
Cholesterol, Total: 125 mg/dL (ref 100–199)
HDL: 42 mg/dL (ref >39)
LDL Calculated: 55 mg/dL (ref 0–99)
Triglycerides: 140 mg/dL (ref 0–149)
VLDL CHOLESTEROL CAL: 28 mg/dL (ref 5–40)

## 2018-01-19 LAB — PSA: PROSTATE SPECIFIC AG, SERUM: 0.7 ng/mL (ref 0.0–4.0)

## 2018-05-17 ENCOUNTER — Telehealth: Payer: Self-pay | Admitting: Family Medicine

## 2018-05-17 ENCOUNTER — Other Ambulatory Visit: Payer: Self-pay | Admitting: Cardiovascular Disease

## 2018-05-17 NOTE — Telephone Encounter (Signed)
Copied from [place] [id number]. Topic: General - Other >> May 17, 2018  3:17 PM Gardiner Ramus wrote: Reason for CRM: pt would like AC1 done with lab when he come in 07/05/18

## 2018-05-17 NOTE — Telephone Encounter (Signed)
Copied from [place] [id number]. Topic: General - Other >> May 17, 2018  3:17 PM Gardiner Ramus wrote: Reason for CRM: pt would like AC1 done with lab when he come in 07/05/18

## 2018-05-17 NOTE — Telephone Encounter (Signed)
Pt is scheduled 07/05/18 at 8 AM for CPX labs. Pt requesting to add an A1C to CPX labs.Pt last A1C was 07/04/16. Terri in lab asked me to add please order CPX labs with A1C. Thank you.

## 2018-05-25 ENCOUNTER — Other Ambulatory Visit: Payer: Self-pay | Admitting: Cardiovascular Disease

## 2018-07-03 ENCOUNTER — Other Ambulatory Visit: Payer: Self-pay | Admitting: Family Medicine

## 2018-07-03 DIAGNOSIS — Z79899 Other long term (current) drug therapy: Secondary | ICD-10-CM

## 2018-07-03 DIAGNOSIS — E785 Hyperlipidemia, unspecified: Secondary | ICD-10-CM

## 2018-07-03 DIAGNOSIS — Z1159 Encounter for screening for other viral diseases: Secondary | ICD-10-CM

## 2018-07-03 DIAGNOSIS — Z125 Encounter for screening for malignant neoplasm of prostate: Secondary | ICD-10-CM

## 2018-07-05 ENCOUNTER — Other Ambulatory Visit (INDEPENDENT_AMBULATORY_CARE_PROVIDER_SITE_OTHER): Payer: Medicare Other

## 2018-07-05 ENCOUNTER — Encounter: Payer: Self-pay | Admitting: *Deleted

## 2018-07-05 DIAGNOSIS — Z1159 Encounter for screening for other viral diseases: Secondary | ICD-10-CM

## 2018-07-05 DIAGNOSIS — Z79899 Other long term (current) drug therapy: Secondary | ICD-10-CM | POA: Diagnosis not present

## 2018-07-05 DIAGNOSIS — Z125 Encounter for screening for malignant neoplasm of prostate: Secondary | ICD-10-CM

## 2018-07-05 DIAGNOSIS — E785 Hyperlipidemia, unspecified: Secondary | ICD-10-CM

## 2018-07-05 LAB — BASIC METABOLIC PANEL
BUN: 14 mg/dL (ref 6–23)
CHLORIDE: 101 meq/L (ref 96–112)
CO2: 32 meq/L (ref 19–32)
Calcium: 9.4 mg/dL (ref 8.4–10.5)
Creatinine, Ser: 1 mg/dL (ref 0.40–1.50)
GFR: 78.86 mL/min (ref >60.00)
Glucose, Bld: 107 mg/dL — ABNORMAL HIGH (ref 70–99)
POTASSIUM: 4.4 meq/L (ref 3.5–5.1)
SODIUM: 139 meq/L (ref 135–145)

## 2018-07-05 LAB — LIPID PANEL
Cholesterol: 127 mg/dL (ref 0–200)
HDL: 36.2 mg/dL — ABNORMAL LOW (ref >39.00)
NONHDL: 91.03
Total CHOL/HDL Ratio: 4
Triglycerides: 239 mg/dL — ABNORMAL HIGH (ref 0.0–149.0)
VLDL: 47.8 mg/dL — AB (ref 0.0–40.0)

## 2018-07-05 LAB — HEPATIC FUNCTION PANEL
ALBUMIN: 4.1 g/dL (ref 3.5–5.2)
ALK PHOS: 70 U/L (ref 39–117)
ALT: 19 U/L (ref 0–53)
AST: 16 U/L (ref 0–37)
Bilirubin, Direct: 0.1 mg/dL (ref 0.0–0.3)
TOTAL PROTEIN: 7 g/dL (ref 6.0–8.3)
Total Bilirubin: 0.8 mg/dL (ref 0.2–1.2)

## 2018-07-05 LAB — CBC WITH DIFFERENTIAL/PLATELET
Basophils Absolute: 0.1 10*3/uL (ref 0.0–0.1)
Basophils Relative: 0.9 % (ref 0.0–3.0)
EOS PCT: 6.5 % — AB (ref 0.0–5.0)
Eosinophils Absolute: 0.5 10*3/uL (ref 0.0–0.7)
HCT: 46 % (ref 39.0–52.0)
Hemoglobin: 15.8 g/dL (ref 13.0–17.0)
LYMPHS ABS: 2.2 10*3/uL (ref 0.7–4.0)
Lymphocytes Relative: 29.9 % (ref 12.0–46.0)
MCHC: 34.3 g/dL (ref 30.0–36.0)
MCV: 91.8 fl (ref 78.0–100.0)
MONO ABS: 0.8 10*3/uL (ref 0.1–1.0)
Monocytes Relative: 10.1 % (ref 3.0–12.0)
NEUTROS PCT: 52.6 % (ref 43.0–77.0)
Neutro Abs: 3.9 10*3/uL (ref 1.4–7.7)
Platelets: 253 10*3/uL (ref 150.0–400.0)
RBC: 5.01 Mil/uL (ref 4.22–5.81)
RDW: 12.7 % (ref 11.5–15.5)
WBC: 7.5 10*3/uL (ref 4.0–10.5)

## 2018-07-05 LAB — PSA: PSA: 0.86 ng/mL (ref 0.10–4.00)

## 2018-07-05 LAB — LDL CHOLESTEROL, DIRECT: LDL DIRECT: 77 mg/dL

## 2018-07-06 LAB — HEPATITIS C ANTIBODY
Hepatitis C Ab: NONREACTIVE
SIGNAL TO CUT-OFF: 0.02 (ref <1.00)

## 2018-07-08 NOTE — Progress Notes (Signed)
Dr. Frederico Hamman T. Syvilla Martin, MD, [facility] [place], [address] Phone: [phone] Fax: [phone]  07/09/2018  Patient: Carlos Erickson, MRN: [id number], DOB: [birth year], 67 y.o.  Primary Physician:  Owens Loffler, MD   Chief Complaint  Patient presents with  . Annual Exam   Subjective:   Carlos Erickson is a 67 y.o. pleasant patient who presents for a medicare wellness examination:  Preventative Health Maintenance Visit:  Health Maintenance Summary Reviewed and updated, unless pt declines services.  Tobacco History Reviewed. Alcohol: No concerns, no excessive use - beers daily Exercise Habits: Some activity, rec at least 30 mins 5 times a week STD concerns: no risk or activity to increase risk Drug Use: None Encouraged self-testicular check  Colon? 06/2016 Dr. Rogelio Seen polyps Flu today  Health Maintenance  Topic Date Due  . TETANUS/TDAP  12/25/1968  . INFLUENZA VACCINE  05/24/2018  . PNA vac Low Risk Adult (2 of 2 - PPSV23) 09/08/2018  . COLONOSCOPY  06/25/2019  . Hepatitis C Screening  Completed    Immunization History  Administered Date(s) Administered  . Influenza, High Dose Seasonal PF 09/03/2015, 09/08/2017  . Influenza,inj,Quad PF,6+ Mos 07/09/2014, 07/04/2016  . Pneumococcal Conjugate-13 11/18/2015, 09/08/2017    Patient Active Problem List   Diagnosis Date Noted  . Past heart attack, 05/11/2014 07/09/2014    Priority: High  . 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 CAD (coronary artery disease), native coronary artery 07/09/2014    Priority: High  . Barrett's esophagus 07/04/2016  . Peripheral neuropathy 01/05/2015  . Hyperlipidemia   . Unilateral recurrent femoral hernia without obstruction or gangrene 07/10/2014  . Hypertension   . GERD (gastroesophageal reflux disease)   . Arthritis    Past Medical History:  Diagnosis Date  . Arthritis   . Barrett's esophagus 07/04/2016   . CAD (coronary artery disease), native coronary artery 05/2014   Non-ST elevation myocardial infarction. Echocardiogram showed normal LV systolic function with mild to moderate mitral regurgitation. Cardiac catheterization showed significant two-vessel coronary artery disease including the RCA and left anterior descending artery. He underwent [facility]G at [facility].  . Colon polyps   . GERD (gastroesophageal reflux disease)   . Hyperlipidemia   . Hypertension   . MI (myocardial infarction) ([place])   . Past heart attack, 05/11/2014 07/09/2014  . S/P CABG x 2 07/09/2014   Past Surgical History:  Procedure Laterality Date  . CARDIAC CATHETERIZATION  03/2014   [facility]  . CORONARY ARTERY BYPASS GRAFT  05/2014   [facility], CABG x 2  . EYE SURGERY    . HEMORROIDECTOMY    . LUNG BIOPSY  2006   [facility]   Social History   Socioeconomic History  . Marital status: Married    Spouse name: Not on file  . Number of children: Not on file  . Years of education: Not on file  . Highest education level: Not on file  Occupational History  . Occupation: retired  Scientific laboratory technician  . Financial resource strain: Not on file  . Food insecurity:    Worry: Not on file    Inability: Not on file  . Transportation needs:    Medical: Not on file    Non-medical: Not on file  Tobacco Use  . Smoking status: Former Smoker    Years: 1.00    Types: Cigars  . Smokeless tobacco: Never Used  Substance and Sexual Activity  .  Alcohol use: Yes    Comment: occ beer  . Drug use: No  . Sexual activity: Yes    Partners: Female  Lifestyle  . Physical activity:    Days per week: Not on file    Minutes per session: Not on file  . Stress: Not on file  Relationships  . Social connections:    Talks on phone: Not on file    Gets together: Not on file    Attends religious service: Not on file    Active member of club or organization: Not on file    Attends meetings of clubs or organizations: Not on file    Relationship status: Not on file    . Intimate partner violence:    Fear of current or ex partner: Not on file    Emotionally abused: Not on file    Physically abused: Not on file    Forced sexual activity: Not on file  Other Topics Concern  . Not on file  Social History Narrative  . Not on file   Family History  Problem Relation Age of Onset  . Stroke Mother   . Hypertension Mother    Allergies  Allergen Reactions  . Plasma Protein Fraction Anaphylaxis    Whole body rash, hypotension intra-operatively during CABG, responded to benadryl and famotidine  . Aspirin Hives    Per pt, able to tolerate low dose aspirin.   Marland Kitchen Morphine And Related Other (See Comments)    hallucanations    Medication list has been reviewed and updated.   General: Denies fever, chills, sweats. No significant weight loss. Eyes: Denies blurring,significant itching ENT: Denies earache, sore throat, and hoarseness. Cardiovascular: Denies chest pains, palpitations, dyspnea on exertion Respiratory: Denies cough, dyspnea at rest,wheeezing Breast: no concerns about lumps GI: Denies nausea, vomiting, diarrhea, constipation, change in bowel habits, abdominal pain, melena, hematochezia GU: Denies penile discharge, ED, urinary flow / outflow problems. No STD concerns. Musculoskeletal: Denies back pain, joint pain Derm: Denies rash, itching Neuro: Denies  paresthesias, frequent falls, frequent headaches Psych: Denies depression, anxiety Endocrine: Denies cold intolerance, heat intolerance, polydipsia Heme: Denies enlarged lymph nodes Allergy: No hayfever  Objective:   BP 118/68   Pulse (!) 56   Temp 97.8 F (36.6 C) (Oral)   Ht _0  (1.727 m)   Wt 186 lb 4 oz (84.5 kg)   SpO2 95%   BMI 28.32 kg/m   The patient completed a fall screen and PHQ-2 and PHQ-9 if necessary, which is documented in the EHR. The CMA/LPN/RN who assisted the patient verbally completed with them and documented results in [place].   Hearing Screening    _1  _2  _3  _4  _5  _6  _7  _8  _9   Right ear:    40       Left ear:   40 40       Comments: Tried hearing aids about 6 months ago and did not think that they worked well and not worth the Teacher, adult education Acuity Screening   Right eye Left eye Both eyes  Without correction:     With correction: _10    GEN: well developed, well nourished, no acute distress Eyes: conjunctiva and lids normal, PERRLA, EOMI ENT: TM clear, nares clear, oral exam WNL Neck: supple, no lymphadenopathy, no thyromegaly, no JVD Pulm: clear to auscultation and percussion, respiratory effort normal CV: regular rate and rhythm, S1-S2, no murmur, rub or gallop, no bruits, peripheral pulses normal and symmetric,  no cyanosis, clubbing, edema or varicosities GI: soft, non-tender; no hepatosplenomegaly, masses; active bowel sounds all quadrants GU: no hernia, testicular mass, penile discharge Lymph: no cervical, axillary or inguinal adenopathy MSK: gait normal, muscle tone and strength WNL, no joint swelling, effusions, discoloration, crepitus  SKIN: clear, good turgor, color WNL, no rashes, lesions, or ulcerations Neuro: normal mental status, normal strength, sensation, and motion Psych: alert; oriented to person, place and time, normally interactive and not anxious or depressed in appearance.  All labs reviewed with patient.  Lipids:    Component Value Date/Time   CHOL 127 07/05/2018 0802   CHOL 125 01/18/2018 1151   CHOL 184 05/12/2014 0256   TRIG 239.0 (H) 07/05/2018 0802   TRIG 567 (H) 05/12/2014 0256   HDL 36.20 (L) 07/05/2018 0802   HDL 42 01/18/2018 1151   HDL 26 (L) 05/12/2014 0256   LDLDIRECT 77.0 07/05/2018 0802   VLDL 47.8 (H) 07/05/2018 0802   VLDL SEE COMMENT 05/12/2014 0256   CHOLHDL 4 07/05/2018 0802   CBC: CBC Latest Ref Rng & Units 07/05/2018 01/18/2018 07/04/2016  WBC 4.0 - 10.5 K/uL 7.5 6.2 6.7  Hemoglobin 13.0 - 17.0 g/dL 15.8 15.4 15.8  Hematocrit  39.0 - 52.0 % 46.0 46.5 46.7  Platelets 150.0 - 400.0 K/uL 253.0 242 450.3    Basic Metabolic Panel:    Component Value Date/Time   NA 139 07/05/2018 0802   NA 142 01/18/2018 1151   NA 138 05/12/2014 1234   K 4.4 07/05/2018 0802   K 3.3 (L) 05/12/2014 1234   CL 101 07/05/2018 0802   CL 102 05/12/2014 1234   CO2 32 07/05/2018 0802   CO2 28 05/12/2014 1234   BUN 14 07/05/2018 0802   BUN 17 01/18/2018 1151   BUN 21 (H) 05/12/2014 1234   CREATININE 1.00 07/05/2018 0802   CREATININE 0.94 05/12/2014 1234   GLUCOSE 107 (H) 07/05/2018 0802   GLUCOSE 108 (H) 05/12/2014 1234   CALCIUM 9.4 07/05/2018 0802   CALCIUM 8.3 (L) 05/12/2014 1234   Hepatic Function Latest Ref Rng & Units 07/05/2018 01/18/2018 07/04/2016  Total Protein 6.0 - 8.3 g/dL 7.0 7.0 6.9  Albumin 3.5 - 5.2 g/dL 4.1 4.4 4.2  AST 0 - 37 U/L _0 ALT 0 - 53 U/L _1 Alk Phosphatase 39 - 117 U/L 70 71 74  Total Bilirubin 0.2 - 1.2 mg/dL 0.8 0.9 0.7  Bilirubin, Direct 0.0 - 0.3 mg/dL 0.1 0.26 0.1    No results found for: TSH Lab Results  Component Value Date   PSA 0.86 07/05/2018   PSA 0.81 07/04/2016   PSA 0.57 05/22/2013    Assessment and Plan:   Healthcare maintenance  Work on exercise, weight, fitness  Health Maintenance Exam: The patient's preventative maintenance and recommended screening tests for an annual wellness exam were reviewed in full today. Brought up to date unless services declined.  Counselled on the importance of diet, exercise, and its role in overall health and mortality. The patient's FH and SH was reviewed, including their home life, tobacco status, and drug and alcohol status.  Follow-up in 1 year for physical exam or additional follow-up below.  I have personally reviewed the Medicare Annual Wellness questionnaire and have noted 1. The patient's medical and social history 2. Their use of alcohol, tobacco or illicit drugs 3. Their current medications and supplements 4. The  patient's functional ability including ADL's, fall risks, home safety risks and hearing or visual  impairment. 5. Diet and physical activities 6. Evidence for depression or mood disorders 7. Reviewed Updated provider list, see scanned forms and [facility] Snapshot.   The patients weight, height, BMI and visual acuity have been recorded in the chart I have made referrals, counseling and provided education to the patient based review of the above and I have provided the pt with a written personalized care plan for preventive services.  I have provided the patient with a copy of your personalized plan for preventive services. Instructed to take the time to review along with their updated medication list.  Follow-up: No follow-ups on file. Or follow-up in 1 year if not noted.  Signed,  Maud Deed. Fredi Geiler, MD   Allergies as of 07/09/2018      Reactions   Plasma Protein Fraction Anaphylaxis   Whole body rash, hypotension intra-operatively during CABG, responded to benadryl and famotidine   Aspirin Hives   Per pt, able to tolerate low dose aspirin.    Morphine And Related Other (See Comments)   hallucanations      Medication List        Accurate as of 07/09/18 11:32 AM. Always use your most recent med list.          amLODipine 5 MG tablet Commonly known as:  NORVASC TAKE 1 TABLET BY MOUTH  DAILY   atorvastatin 40 MG tablet Commonly known as:  LIPITOR Take 1 tablet (40 mg total) by mouth daily.   carvedilol 6.25 MG tablet Commonly known as:  COREG TAKE 1 TABLET BY MOUTH TWO  TIMES DAILY   clopidogrel 75 MG tablet Commonly known as:  PLAVIX TAKE 1 TABLET BY MOUTH ONCE DAILY. MUST HAVE OFFICE VISIT BEFORE FURTHER REFILLS.   lansoprazole 15 MG capsule Commonly known as:  PREVACID Take 15 mg by mouth daily as needed.   losartan 50 MG tablet Commonly known as:  COZAAR Take 1 tablet (50 mg total) by mouth daily.

## 2018-07-09 ENCOUNTER — Ambulatory Visit (INDEPENDENT_AMBULATORY_CARE_PROVIDER_SITE_OTHER): Payer: Medicare Other | Admitting: Family Medicine

## 2018-07-09 ENCOUNTER — Encounter: Payer: Self-pay | Admitting: Family Medicine

## 2018-07-09 VITALS — BP 118/68 | HR 56 | Temp 97.8°F | Ht 68.0 in | Wt 186.2 lb

## 2018-07-09 DIAGNOSIS — Z0000 Encounter for general adult medical examination without abnormal findings: Secondary | ICD-10-CM

## 2018-07-09 DIAGNOSIS — Z23 Encounter for immunization: Secondary | ICD-10-CM | POA: Diagnosis not present

## 2018-07-09 NOTE — Addendum Note (Signed)
Addended by: Emelia Salisbury C on: 07/09/2018 12:32 PM   Modules accepted: Orders

## 2018-07-13 ENCOUNTER — Other Ambulatory Visit: Payer: Self-pay | Admitting: Cardiovascular Disease

## 2018-07-17 ENCOUNTER — Telehealth: Payer: Self-pay | Admitting: Cardiovascular Disease

## 2018-07-17 NOTE — Telephone Encounter (Signed)
Pt c/o medication issue:  1. Name of Medication: Losartan 50 mg po q day   2. How are you currently taking this medication (dosage and times per day)? 50 mg po q day   3. Are you having a reaction (difficulty breathing--STAT)? No   4. What is your medication issue? Concerned with cancer risk that has been advertised.  Please call

## 2018-07-17 NOTE — Telephone Encounter (Signed)
Advised wife to contact patient's pharmacy to find out if his losartan is one of the manufacturer's that has been recalled or not. She verbalized understanding and will call us back if they have any more questions or concerns.

## 2018-09-10 DIAGNOSIS — H01022 Squamous blepharitis right lower eyelid: Secondary | ICD-10-CM | POA: Diagnosis not present

## 2018-09-10 DIAGNOSIS — H01021 Squamous blepharitis right upper eyelid: Secondary | ICD-10-CM | POA: Diagnosis not present

## 2018-09-10 DIAGNOSIS — H31091 Other chorioretinal scars, right eye: Secondary | ICD-10-CM | POA: Diagnosis not present

## 2018-09-10 DIAGNOSIS — H2512 Age-related nuclear cataract, left eye: Secondary | ICD-10-CM | POA: Diagnosis not present

## 2018-09-10 DIAGNOSIS — H04123 Dry eye syndrome of bilateral lacrimal glands: Secondary | ICD-10-CM | POA: Diagnosis not present

## 2018-11-01 ENCOUNTER — Telehealth: Payer: Self-pay | Admitting: Cardiovascular Disease

## 2018-11-01 NOTE — Telephone Encounter (Signed)
Returned the call to the dentist office. They will be sending a clearance form to the office to be signed with instructions pertaining to the Plavix.The patient was sent home on an antibiotic for the time being.

## 2018-11-01 NOTE — Telephone Encounter (Signed)
Pt is at the dentist now and needs a tooth extracted. Please advise if this is ok.

## 2018-11-02 ENCOUNTER — Telehealth: Payer: Self-pay | Admitting: Cardiovascular Disease

## 2018-11-02 NOTE — Telephone Encounter (Signed)
   [facility] Pre-operative Risk Assessment    Request for surgical clearance:  1. What type of surgery is being performed? Dental Extraction  2. When is this surgery scheduled? Not listed  3. What type of clearance is required (medical clearance vs. Pharmacy clearance to hold med vs. Both)? Not listed  4. Are there any medications that need to be held prior to surgery and how long? Not listed  5. Practice name and name of physician performing surgery? [facility]  6. What is your office phone number [phone]   7.   What is your office fax number [phone]  8.   Anesthesia type (None, local, MAC, general) ?not listed   Lucienne Minks 11/02/2018, 7:55 AM  _________________________________________________________________   (provider comments below)

## 2018-11-02 NOTE — Telephone Encounter (Signed)
He can proceed at an overall low risk.  No need for antibiotic prophylaxis.  I prefer that he does not stop Plavix given that he is not able to take aspirin.  If the dentist feels that the extraction is going to be bloody, Plavix can be held for 2 days at most.

## 2018-11-02 NOTE — Telephone Encounter (Signed)
Patient calling to check status of clearance he is in pain from toothache

## 2018-11-05 NOTE — Telephone Encounter (Signed)
Copy of this phone encounter was faxed to [facility] at [phone]. Confirmation received.  I have called and spoken with the patient. He is aware of Dr. Tyrell Antonio recommendations and that his clearance has been sent.

## 2019-02-11 ENCOUNTER — Other Ambulatory Visit: Payer: Self-pay | Admitting: Cardiovascular Disease

## 2019-02-27 ENCOUNTER — Telehealth: Payer: Self-pay

## 2019-02-27 NOTE — Telephone Encounter (Signed)

## 2019-02-28 ENCOUNTER — Telehealth (INDEPENDENT_AMBULATORY_CARE_PROVIDER_SITE_OTHER): Payer: Medicare Other | Admitting: Cardiovascular Disease

## 2019-02-28 ENCOUNTER — Other Ambulatory Visit: Payer: Self-pay

## 2019-02-28 ENCOUNTER — Encounter: Payer: Self-pay | Admitting: Cardiovascular Disease

## 2019-02-28 VITALS — BP 140/89 | HR 62 | Ht 68.0 in | Wt 192.0 lb

## 2019-02-28 DIAGNOSIS — I10 Essential (primary) hypertension: Secondary | ICD-10-CM

## 2019-02-28 DIAGNOSIS — I2510 Atherosclerotic heart disease of native coronary artery without angina pectoris: Secondary | ICD-10-CM

## 2019-02-28 MED ORDER — LOSARTAN POTASSIUM 100 MG PO TABS: 100.0000 mg | ORAL_TABLET | Freq: Every day | ORAL | 3 refills | Status: DC

## 2019-02-28 NOTE — Progress Notes (Signed)
Virtual Visit via Telephone Note   This visit type was conducted due to national recommendations for restrictions regarding the COVID-19 Pandemic (e.g. social distancing) in an effort to limit this patient's exposure and mitigate transmission in our community.  Due to his co-morbid illnesses, this patient is at least at moderate risk for complications without adequate follow up.  This format is felt to be most appropriate for this patient at this time.  The patient did not have access to video technology/had technical difficulties with video requiring transitioning to audio format only (telephone).  All issues noted in this document were discussed and addressed.  No physical exam could be performed with this format.  Please refer to the patient's chart for his  consent to telehealth for [facility].   Date:  02/28/2019   ID:  Carlos Erickson, DOB July 31, 1951, MRN [id number]  Patient Location: Home Provider Location: Office  PCP:  Owens Loffler, MD  Cardiologist:  Kathlyn Sacramento, MD  Electrophysiologist:  None   Evaluation Performed:  Follow-Up Visit  Chief Complaint: Difficulty sleeping and elevated blood pressure  History of Present Illness:    Carlos Erickson is a 68 y.o. male who was reached via phone for a follow-up visit regarding coronary artery disease and hypertension. He had non-ST elevation myocardial infarction in August 2015.  Cardiac catheterization showed significant complex two-vessel coronary artery disease including the LAD and RCA. He underwent CABG at [facility].  Ejection fraction was normal by echo.  Other medical issues include hypertension and hyperlipidemia. The patient has suspected sleep apnea but could not get a sleep study done due to cost.  Treadmill stress test in 2017 was normal. He reports recent insomnia and difficulty sleeping at night.  He also noticed that his blood pressure has been elevated on multiple occasions.  He denies chest pain or shortness of  breath.  He has been doing well and denies any chest pain, shortness of breath, palpitations or dizziness.  He takes his medications regularly.  Has not had any routine labs in more than a year.    The patient does not have symptoms concerning for COVID-19 infection (fever, chills, cough, or new shortness of breath).    Past Medical History:  Diagnosis Date  . Arthritis   . Barrett's esophagus 07/04/2016  . CAD (coronary artery disease), native coronary artery 05/2014   Non-ST elevation myocardial infarction. Echocardiogram showed normal LV systolic function with mild to moderate mitral regurgitation. Cardiac catheterization showed significant two-vessel coronary artery disease including the RCA and left anterior descending artery. He underwent CABG at [facility].  . Colon polyps   . GERD (gastroesophageal reflux disease)   . Hyperlipidemia   . Hypertension   . MI (myocardial infarction) ([place])   . Past heart attack, 05/11/2014 07/09/2014  . S/P CABG x 2 07/09/2014   Past Surgical History:  Procedure Laterality Date  . CARDIAC CATHETERIZATION  03/2014   [facility]  . CORONARY ARTERY BYPASS GRAFT  05/2014   [facility], CABG x 2  . EYE SURGERY    . HEMORROIDECTOMY    . LUNG BIOPSY  2006   [facility]     Current Meds  Medication Sig  . amLODipine (NORVASC) 5 MG tablet TAKE 1 TABLET BY MOUTH  DAILY  . atorvastatin (LIPITOR) 40 MG tablet TAKE 1 TABLET BY MOUTH  DAILY  . carvedilol (COREG) 6.25 MG tablet TAKE 1 TABLET BY MOUTH TWO  TIMES DAILY  . clopidogrel (PLAVIX) 75 MG tablet TAKE 1 TABLET  BY MOUTH ONCE DAILY  . ibuprofen (ADVIL) 800 MG tablet TAKE 1 TABLET BY MOUTH EVERY 6 TO 8 HOURS AS NEEDED FOR PAIN  . losartan (COZAAR) 100 MG tablet Take 1 tablet (100 mg total) by mouth daily.  Marland Kitchen omeprazole (PRILOSEC) 20 MG capsule Take 20 mg by mouth daily.  . [DISCONTINUED] losartan (COZAAR) 50 MG tablet TAKE 1 TABLET BY MOUTH  DAILY     Allergies:   Plasma protein fraction; Aspirin; and Morphine and related    Social History   Tobacco Use  . Smoking status: Former Smoker    Years: 1.00    Types: Cigars  . Smokeless tobacco: Never Used  Substance Use Topics  . Alcohol use: Yes    Comment: occ beer  . Drug use: No     Family Hx: The patient's family history includes Hypertension in his mother; Stroke in his mother.  ROS:   Please see the history of present illness.     All other systems reviewed and are negative.   Prior CV studies:   The following studies were reviewed today:    Labs/Other Tests and Data Reviewed:    EKG:  No ECG reviewed.  Recent Labs: 07/05/2018: ALT 19; BUN 14; Creatinine, Ser 1.00; Hemoglobin 15.8; Platelets 253.0; Potassium 4.4; Sodium 139   Recent Lipid Panel Lab Results  Component Value Date/Time   CHOL 127 07/05/2018 08:02 AM   CHOL 125 01/18/2018 11:51 AM   CHOL 184 05/12/2014 02:56 AM   TRIG 239.0 (H) 07/05/2018 08:02 AM   TRIG 567 (H) 05/12/2014 02:56 AM   HDL 36.20 (L) 07/05/2018 08:02 AM   HDL 42 01/18/2018 11:51 AM   HDL 26 (L) 05/12/2014 02:56 AM   CHOLHDL 4 07/05/2018 08:02 AM   LDLCALC 55 01/18/2018 11:51 AM   LDLCALC SEE COMMENT 05/12/2014 02:56 AM   LDLDIRECT 77.0 07/05/2018 08:02 AM    Wt Readings from Last 3 Encounters:  02/28/19 192 lb (87.1 kg)  07/09/18 186 lb 4 oz (84.5 kg)  01/18/18 179 lb 8 oz (81.4 kg)     Objective:    Vital Signs:  BP 140/89   Pulse 62   Ht 5\' 8"  (1.727 m)   Wt 192 lb (87.1 kg)   BMI 29.19 kg/m    VITAL SIGNS:  reviewed  ASSESSMENT & PLAN:    1.  Coronary artery disease involving native coronary arteries :  He is doing well overall with no anginal symptoms. Continue medical therapy. He is on long-term Plavix instead of aspirin due to allergy.   2. Hypertension:   I reviewed his blood pressure readings with him and overall his systolic blood pressure has been ranging from 140 to 160 mmHg.  I increase losartan to 100 mg once daily.  3. Hyperlipidemia: Continue treatment with  atorvastatin.  Most recent lipid profile showed an LDL of 77.  His triglyceride was mildly elevated at 239 and we should consider treatment with Vascepa  4.    Suspected sleep apnea: It is possible that his poor quality sleep is related to this but the patient has never been tested.  I encouraged him to discuss this with his primary care physician so he can be tested in the near future once COVID restrictions are eased.     COVID-19 Education: The signs and symptoms of COVID-19 were discussed with the patient and how to seek care for testing (follow up with PCP or arrange E-visit).  The importance of social distancing was  discussed today.  Time:   Today, I have spent 22 minutes with the patient with telehealth technology discussing the above problems.     Medication Adjustments/Labs and Tests Ordered: Current medicines are reviewed at length with the patient today.  Concerns regarding medicines are outlined above.   Tests Ordered: No orders of the defined types were placed in this encounter.   Medication Changes: Meds ordered this encounter  Medications  . losartan (COZAAR) 100 MG tablet    Sig: Take 1 tablet (100 mg total) by mouth daily.    Dispense:  90 tablet    Refill:  3    The dose is being increased from 50 mg    Disposition:  Follow up in 6 month(s)  Signed, Kathlyn Sacramento, MD  02/28/2019 11:32 AM    [place]

## 2019-02-28 NOTE — Patient Instructions (Signed)
Medication Instructions:  Increase losartan to 100 mg once daily If you need a refill on your cardiac medications before your next appointment, please call your pharmacy.   Lab work: None If you have labs (blood work) drawn today and your tests are completely normal, you will receive your results only by: Marland Kitchen MyChart Message (if you have MyChart) OR . A paper copy in the mail If you have any lab test that is abnormal or we need to change your treatment, we will call you to review the results.  Testing/Procedures: None  Follow-Up: At [facility], you and your health needs are our priority.  As part of our continuing mission to provide you with exceptional heart care, we have created designated Provider Care Teams.  These Care Teams include your primary Cardiologist (physician) and Advanced Practice Providers (APPs -  Physician Assistants and Nurse Practitioners) who all work together to provide you with the care you need, when you need it. You will need a follow up appointment in 6 months.  Please call our office 2 months in advance to schedule this appointment.  You may see Kathlyn Sacramento, MD or one of the following Advanced Practice Providers on your designated Care Team:   Murray Hodgkins, NP Christell Faith, PA-C . Marrianne Mood, PA-C

## 2019-03-11 ENCOUNTER — Telehealth: Payer: Self-pay | Admitting: Family Medicine

## 2019-03-11 DIAGNOSIS — G4733 Obstructive sleep apnea (adult) (pediatric): Secondary | ICD-10-CM

## 2019-03-11 NOTE — Telephone Encounter (Signed)
Best number [phone]  Spouse (carolyn) called to see if dr Fletcher Anon got in touch with you about you schedule pt for sleep study.

## 2019-03-11 NOTE — Telephone Encounter (Signed)
pulm consult made for sleep

## 2019-03-19 ENCOUNTER — Ambulatory Visit (INDEPENDENT_AMBULATORY_CARE_PROVIDER_SITE_OTHER): Payer: Medicare Other | Admitting: Internal Medicine

## 2019-03-19 DIAGNOSIS — G4719 Other hypersomnia: Secondary | ICD-10-CM

## 2019-03-19 NOTE — Progress Notes (Signed)
[facility] Consultation     Virtual Visit via Telephone Note I connected with patient on 03/19/19 at 10:00 AM EDT by telephone and verified that I am speaking with the correct person using two identifiers.   I discussed the limitations, risks, security and privacy concerns of performing an evaluation and management service by telephone and the availability of in person appointments. I also discussed with the patient that there may be a patient responsible charge related to this service. The patient expressed understanding and agreed to proceed. I discussed the assessment and treatment plan with the patient. The patient was provided an opportunity to ask questions and all were answered. The patient agreed with the plan and demonstrated an understanding of the instructions. Please see note below for further detail.    The patient was advised to call back or seek an in-person evaluation if the symptoms worsen or if the condition fails to improve as anticipated.    Laverle Hobby, MD    Assessment and Plan:  Excessive daytime sleepiness. - Symptoms and signs of obstructive sleep apnea. - We will send for sleep study.  Essential hypertension. - Obstructive sleep apnea can contribute to above condition, therefore treatment of sleep apnea is an important part of management.   Date: 03/19/2019  MRN# [id number] VOSHON PETRO 1951-06-16  Referring Physician: Dr. Royann Shivers Carlos Erickson is a 68 y.o. old male seen in consultation for chief complaint of: daytime sleepiness.     HPI:   He is seeing a cardiologist, and was recommended that he should have a sleep study. His wife notes that he is snoring, and his BP has been higher than usual recently. He wakes about 1-2 hours every night. He goes to bed at 10 pm, falls asleep after 1-2 hours. He wakes at 8 am, does not feel rested. He does not nap during the day.  His wife notes that he gasps for air sometimes  while sleeping.   Denies sleep paralysis, sleep walking, cataplexy. Denies jaw pain, TMJ.  He has never been testing for OSA in the family, no one in family has it as far as he knows.     PMHX:   Past Medical History:  Diagnosis Date  . Arthritis   . Barrett's esophagus 07/04/2016  . CAD (coronary artery disease), native coronary artery 05/2014   Non-ST elevation myocardial infarction. Echocardiogram showed normal LV systolic function with mild to moderate mitral regurgitation. Cardiac catheterization showed significant two-vessel coronary artery disease including the RCA and left anterior descending artery. He underwent CABG at [facility].  . Colon polyps   . GERD (gastroesophageal reflux disease)   . Hyperlipidemia   . Hypertension   . MI (myocardial infarction) ([place])   . Past heart attack, 05/11/2014 07/09/2014  . 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 CARDIAC CATHETERIZATION  03/2014   [facility]  . CORONARY ARTERY BYPASS GRAFT  05/2014   [facility], CABG x 2  . EYE SURGERY    . HEMORROIDECTOMY    . LUNG BIOPSY  2006   [facility]   Family Hx:  Family History  Problem Relation Age of Onset  . Stroke Mother   . Hypertension Mother    Social Hx:   Social History   Tobacco Use  . Smoking status: Former Smoker    Years: 1.00    Types: Cigars  . Smokeless tobacco: Never Used  Substance Use Topics  .  Alcohol use: Yes    Comment: occ beer  . Drug use: No   Medication:    Current Outpatient Medications:  .  amLODipine (NORVASC) 5 MG tablet, TAKE 1 TABLET BY MOUTH  DAILY, Disp: 90 tablet, Rfl: 3 .  atorvastatin (LIPITOR) 40 MG tablet, TAKE 1 TABLET BY MOUTH  DAILY, Disp: 90 tablet, Rfl: 0 .  carvedilol (COREG) 6.25 MG tablet, TAKE 1 TABLET BY MOUTH TWO  TIMES DAILY, Disp: 180 tablet, Rfl: 3 .  clopidogrel (PLAVIX) 75 MG tablet, TAKE 1 TABLET BY MOUTH ONCE DAILY, Disp: 90 tablet, Rfl: 0 .  ibuprofen (ADVIL) 800 MG tablet, TAKE 1 TABLET BY  MOUTH EVERY 6 TO 8 HOURS AS NEEDED FOR PAIN, Disp: , Rfl:  .  losartan (COZAAR) 100 MG tablet, Take 1 tablet (100 mg total) by mouth daily., Disp: 90 tablet, Rfl: 3 .  omeprazole (PRILOSEC) 20 MG capsule, Take 20 mg by mouth daily., Disp: , Rfl:    Allergies:  Plasma protein fraction; Aspirin; and Morphine and related  Review of Systems: Gen:  Denies  fever, sweats, chills HEENT: Denies blurred vision, double vision. bleeds, sore throat Cvc:  No dizziness, chest pain. Resp:   Denies cough or sputum production, shortness of breath Gi: Denies swallowing difficulty, stomach pain. Gu:  Denies bladder incontinence, burning urine Ext:   No Joint pain, stiffness. Skin: No skin rash,  hives  Endoc:  No polyuria, polydipsia. Psych: No depression, insomnia. Other:  All other systems were reviewed with the patient and were negative other that what is mentioned in the HPI.   Physical Examination:  Weight, 192.     LABORATORY PANEL:   CBC No results for input(s): WBC, HGB, HCT, PLT in the last 168 hours. ------------------------------------------------------------------------------------------------------------------  Chemistries  No results for input(s): NA, K, CL, CO2, GLUCOSE, BUN, CREATININE, CALCIUM, MG, AST, ALT, ALKPHOS, BILITOT in the last 168 hours.  Invalid input(s): GFRCGP ------------------------------------------------------------------------------------------------------------------  Cardiac Enzymes No results for input(s): TROPONINI in the last 168 hours. ------------------------------------------------------------  RADIOLOGY:  No results found.     Thank  you for the consultation and for allowing [facility] to assist in the care of your patient. Our recommendations are noted above.  Please contact us if we can be of further service.   Marda Stalker, M.D., F.C.C.P.  Board Certified in Internal Medicine, Pulmonary Medicine, [place], and Sleep Medicine.  [facility] Office Number: [phone]   03/19/2019

## 2019-03-19 NOTE — Patient Instructions (Signed)

## 2019-03-20 ENCOUNTER — Other Ambulatory Visit: Payer: Self-pay

## 2019-03-20 ENCOUNTER — Ambulatory Visit: Payer: Medicare Other

## 2019-03-20 DIAGNOSIS — G4719 Other hypersomnia: Secondary | ICD-10-CM

## 2019-03-21 DIAGNOSIS — G4733 Obstructive sleep apnea (adult) (pediatric): Secondary | ICD-10-CM | POA: Diagnosis not present

## 2019-03-22 ENCOUNTER — Telehealth: Payer: Self-pay | Admitting: Internal Medicine

## 2019-03-22 DIAGNOSIS — G4733 Obstructive sleep apnea (adult) (pediatric): Secondary | ICD-10-CM | POA: Diagnosis not present

## 2019-03-22 NOTE — Telephone Encounter (Signed)
HST performed on 03/20/2019 confirmed moderate OSA with AHI of 27, but very severe in supine position with AHI of 70 Recommend auto cpap 5-20cm H2O. Pt is aware of results and voiced his understanding.  Pt wished to proceed with cpap therapy. Order has been placed. Pt has been scheduled for f/u on 05/23/2019 at 2:45p. Nothing further is needed.

## 2019-03-25 ENCOUNTER — Telehealth: Payer: Self-pay | Admitting: Internal Medicine

## 2019-03-25 DIAGNOSIS — G4730 Sleep apnea, unspecified: Secondary | ICD-10-CM

## 2019-03-25 HISTORY — DX: Sleep apnea, unspecified: G47.30

## 2019-03-25 NOTE — Telephone Encounter (Signed)
Levada Dy called back and she is aware. Nothing further needed.

## 2019-03-25 NOTE — Telephone Encounter (Signed)
Sleep study has been uploaded within epic. Left message for Levada Dy with [employer] to make her aware.

## 2019-04-02 DIAGNOSIS — G4733 Obstructive sleep apnea (adult) (pediatric): Secondary | ICD-10-CM | POA: Diagnosis not present

## 2019-04-14 ENCOUNTER — Other Ambulatory Visit: Payer: Self-pay | Admitting: Cardiovascular Disease

## 2019-05-02 DIAGNOSIS — G4733 Obstructive sleep apnea (adult) (pediatric): Secondary | ICD-10-CM | POA: Diagnosis not present

## 2019-05-15 ENCOUNTER — Other Ambulatory Visit: Payer: Self-pay | Admitting: Cardiovascular Disease

## 2019-05-23 ENCOUNTER — Ambulatory Visit (INDEPENDENT_AMBULATORY_CARE_PROVIDER_SITE_OTHER): Payer: Medicare Other | Admitting: Internal Medicine

## 2019-05-23 DIAGNOSIS — G4733 Obstructive sleep apnea (adult) (pediatric): Secondary | ICD-10-CM | POA: Diagnosis not present

## 2019-05-23 NOTE — Patient Instructions (Signed)
Will adjust auto cpap pressure to range of 10-20 cm H2O.  Continue to use CPAP every night.

## 2019-05-23 NOTE — Progress Notes (Signed)
[facility] Consultation     Virtual Visit via Telephone Note I connected with patient on 05/23/19 at  2:45 PM EDT by telephone and verified that I am speaking with the correct person using two identifiers.   I discussed the limitations, risks of performing an evaluation and management service by telephone and the availability of in person appointments. I also discussed with the patient that there may be a patient responsible charge related to this service.  In light of current covid-19 pandemic, patient also understands that we are trying to protect them by minimizing in office contact if at all possible.  The patient expressed understanding and agreed to proceed. Please see note below for further detail.    The patient was advised to call back or seek an in-person evaluation if the symptoms worsen or if the condition fails to improve as anticipated. I provided 15 minutes of non face-to-face time during this encounter.    Laverle Hobby, MD   Assessment and Plan:  Moderate obstructive sleep apnea, AHI of 27. - Doing well with CPAP.  --Adjust the pressure range to 10-20 cm H2O  Essential hypertension. - Obstructive sleep apnea can contribute to above condition, therefore treatment of sleep apnea is an important part of management.  Return in about 1 year (around 05/22/2020).   Date: 05/23/2019  MRN# [id number] Carlos Erickson 11-07-1950  Referring Physician: Dr. Royann Shivers Carlos Erickson is a 68 y.o. old male seen in consultation for chief complaint of: daytime sleepiness.     HPI:   Since his last visit he has been doing well with the CPAP, he is more awake, he is sleeping through the night and is not snoring. His nocturia is better. His SBP has gone down about 10 mm since starting CPAP.    **CPAP download 04/13/2019-05/20/2019>> raw data personally reviewed.  Usage greater than 4 hours is 30/30 days.  Average usage on days used is 8 hours 38 minutes.   Pressure ranges 5-20.  Median pressure 12.6, 95th percentile pressure 17.5, maximum pressure 19.  Leaks are within normal limits.  Residual AHI is 3.4.  Overall this shows excellent compliance with CPAP with excellent control of obstructive sleep apnea. **HST 03/20/2019>> moderate obstructive sleep apnea with AHI of 27.  Recommended auto CPAP with pressure range 5-20.  Medication:    Current Outpatient Medications:  .  amLODipine (NORVASC) 5 MG tablet, TAKE 1 TABLET BY MOUTH  DAILY, Disp: 90 tablet, Rfl: 1 .  atorvastatin (LIPITOR) 40 MG tablet, TAKE 1 TABLET BY MOUTH  DAILY, Disp: 90 tablet, Rfl: 1 .  carvedilol (COREG) 6.25 MG tablet, TAKE 1 TABLET BY MOUTH TWO  TIMES DAILY, Disp: 180 tablet, Rfl: 1 .  clopidogrel (PLAVIX) 75 MG tablet, TAKE 1 TABLET BY MOUTH ONCE DAILY, Disp: 90 tablet, Rfl: 1 .  ibuprofen (ADVIL) 800 MG tablet, TAKE 1 TABLET BY MOUTH EVERY 6 TO 8 HOURS AS NEEDED FOR PAIN, Disp: , Rfl:  .  losartan (COZAAR) 100 MG tablet, Take 1 tablet (100 mg total) by mouth daily., Disp: 90 tablet, Rfl: 3 .  omeprazole (PRILOSEC) 20 MG capsule, Take 20 mg by mouth daily., Disp: , Rfl:    Allergies:  Plasma protein fraction, Aspirin, and Morphine and related  Review of Systems:  Constitutional: Feels well. Cardiovascular: Denies chest pain, exertional chest pain.  Pulmonary: Denies hemoptysis, pleuritic chest pain.   The remainder of systems were reviewed and were found to be negative other than what  is documented in the HPI.      LABORATORY PANEL:   CBC No results for input(s): WBC, HGB, HCT, PLT in the last 168 hours. ------------------------------------------------------------------------------------------------------------------  Chemistries  No results for input(s): NA, K, CL, CO2, GLUCOSE, BUN, CREATININE, CALCIUM, MG, AST, ALT, ALKPHOS, BILITOT in the last 168 hours.  Invalid input(s): GFRCGP  ------------------------------------------------------------------------------------------------------------------  Cardiac Enzymes No results for input(s): TROPONINI in the last 168 hours. ------------------------------------------------------------  RADIOLOGY:  No results found.     Thank  you for the consultation and for allowing [facility] to assist in the care of your patient. Our recommendations are noted above.  Please contact us if we can be of further service.   Marda Stalker, M.D., F.C.C.P.  Board Certified in Internal Medicine, Pulmonary Medicine, [place], and Sleep Medicine.  [facility] Office Number: [phone]   05/23/2019

## 2019-06-02 DIAGNOSIS — G4733 Obstructive sleep apnea (adult) (pediatric): Secondary | ICD-10-CM | POA: Diagnosis not present

## 2019-06-03 ENCOUNTER — Telehealth: Payer: Self-pay

## 2019-06-03 NOTE — Telephone Encounter (Signed)
Bruised look on toenails of big toe and little toe on rt ft.; big toe and little toe on rt foot are numb on ends of toe. Pt has had numbness before but never had purple color under toenails. Pt is sure has not had any injury to rt foot. 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 Pt has hx of neuropathy. Pt is not having any difficulty walking.no pressure or pain under toe nails or toes on rt foot. After shower noticed purple toe nails. No covid symptoms except H/A on and off for the last month. Pt started using a cpap mask 1 month ago and thought that was causing the H/A. No travel and no known exposure to + covid. ED precautions given. Pt scheduled virtual appt on 06/04/19 at 11AM. FYI to Dr Diona Browner.

## 2019-06-04 ENCOUNTER — Other Ambulatory Visit: Payer: Self-pay

## 2019-06-04 ENCOUNTER — Ambulatory Visit: Payer: Medicare Other | Admitting: Family Medicine

## 2019-06-04 NOTE — Telephone Encounter (Signed)
Carlos Erickson cancelled his appointment.  He told Carlos Erickson his toes looks better today.

## 2019-06-04 NOTE — Telephone Encounter (Signed)
Please call and change to in person. Doubt covid is cause of mild intermittant headache without any other symptoms.

## 2019-06-19 ENCOUNTER — Other Ambulatory Visit: Payer: Self-pay

## 2019-06-19 DIAGNOSIS — Z20822 Contact with and (suspected) exposure to covid-19: Secondary | ICD-10-CM

## 2019-06-20 LAB — NOVEL CORONAVIRUS, NAA: SARS-CoV-2, NAA: NOT DETECTED

## 2019-07-03 DIAGNOSIS — G4733 Obstructive sleep apnea (adult) (pediatric): Secondary | ICD-10-CM | POA: Diagnosis not present

## 2019-08-02 DIAGNOSIS — G4733 Obstructive sleep apnea (adult) (pediatric): Secondary | ICD-10-CM | POA: Diagnosis not present

## 2019-08-19 ENCOUNTER — Telehealth: Payer: Self-pay | Admitting: *Deleted

## 2019-08-19 DIAGNOSIS — Z86010 Personal history of colonic polyps: Secondary | ICD-10-CM | POA: Diagnosis not present

## 2019-08-19 DIAGNOSIS — I2510 Atherosclerotic heart disease of native coronary artery without angina pectoris: Secondary | ICD-10-CM | POA: Diagnosis not present

## 2019-08-19 DIAGNOSIS — K219 Gastro-esophageal reflux disease without esophagitis: Secondary | ICD-10-CM | POA: Diagnosis not present

## 2019-08-19 NOTE — Telephone Encounter (Signed)
   [facility] Pre-operative Risk Assessment    Request for surgical clearance:  1. What type of surgery is being performed? COLONOSCOPY   2. When is this surgery scheduled? 09/03/19   3. What type of clearance is required (medical clearance vs. Pharmacy clearance to hold med vs. Both)? MEDICAL  4. Are there any medications that need to be held prior to surgery and how long? PLAVIX   5. Practice name and name of physician performing surgery? [facility]; DR. HUNG   6. What is your office phone number [phone]    7.   What is your office fax number [phone]  8.   Anesthesia type (None, local, MAC, general) ? PROPOFOL    Carlos Erickson 08/19/2019, 4:25 PM  _________________________________________________________________   (provider comments below)

## 2019-08-19 NOTE — Telephone Encounter (Signed)
Dr. Fletcher Anon, This patient has a history of CAD s/p CABG at [facility] 05/2014. He is on plavix monotherapy due to an ASA allergy. Please comment on holding plavix for colonoscopy.

## 2019-08-21 NOTE — Telephone Encounter (Signed)
Hold Plavix 5 days before.

## 2019-08-23 NOTE — Telephone Encounter (Signed)
   Primary Cardiologist: Kathlyn Sacramento, MD  Chart reviewed as part of pre-operative protocol coverage. Patient was contacted 08/23/2019 in reference to pre-operative risk assessment for pending surgery as outlined below.  Carlos Erickson was last seen on 02/28/2019 via virtual visit by Dr. Fletcher Anon.  Since that day, Carlos Erickson has done well without chest pain or worsening shortness of breath.  Therefore, based on ACC/AHA guidelines, the patient would be at acceptable risk for the planned procedure without further cardiovascular testing.   I will route this recommendation to the requesting party via Epic fax function and remove from pre-op pool.  Please call with questions. Per Dr. Fletcher Anon, ok to hold plavix for 5 days prior to the procedure and restart as soon as possible afterward. I have informed the patient the duration to hold plavix.     [place], [place] 08/23/2019, 4:33 PM

## 2019-09-02 DIAGNOSIS — G4733 Obstructive sleep apnea (adult) (pediatric): Secondary | ICD-10-CM | POA: Diagnosis not present

## 2019-09-03 DIAGNOSIS — Z86010 Personal history of colonic polyps: Secondary | ICD-10-CM | POA: Diagnosis not present

## 2019-09-03 DIAGNOSIS — K635 Polyp of colon: Secondary | ICD-10-CM | POA: Diagnosis not present

## 2019-09-03 DIAGNOSIS — K5730 Diverticulosis of large intestine without perforation or abscess without bleeding: Secondary | ICD-10-CM | POA: Diagnosis not present

## 2019-09-03 DIAGNOSIS — D125 Benign neoplasm of sigmoid colon: Secondary | ICD-10-CM | POA: Diagnosis not present

## 2019-09-03 DIAGNOSIS — D128 Benign neoplasm of rectum: Secondary | ICD-10-CM | POA: Diagnosis not present

## 2019-09-03 LAB — HM COLONOSCOPY

## 2019-09-11 ENCOUNTER — Encounter: Payer: Self-pay | Admitting: Family Medicine

## 2019-09-11 ENCOUNTER — Other Ambulatory Visit: Payer: Self-pay

## 2019-09-11 ENCOUNTER — Ambulatory Visit (INDEPENDENT_AMBULATORY_CARE_PROVIDER_SITE_OTHER): Payer: Medicare Other | Admitting: Family Medicine

## 2019-09-11 VITALS — BP 138/82 | HR 70 | Temp 98.2°F | Ht 68.0 in | Wt 189.0 lb

## 2019-09-11 DIAGNOSIS — K148 Other diseases of tongue: Secondary | ICD-10-CM | POA: Diagnosis not present

## 2019-09-11 DIAGNOSIS — K1379 Other lesions of oral mucosa: Secondary | ICD-10-CM

## 2019-09-11 NOTE — Progress Notes (Signed)
Carlos Matsuura T. Carlos Royal, MD Primary Care and [place] at [facility] [place], [address] Phone: [phone]  FAX: [place] - 68 y.o. male  MRN [id number]  Date of Birth: 1951-09-08  Visit Date: 09/11/2019  PCP: Owens Loffler, MD  Referred by: Owens Loffler, MD  Chief Complaint  Patient presents with  . Mouth Lesions   Subjective:   Carlos Erickson is a 68 y.o. very pleasant male patient who presents with the following:  Has been there and started hurting about two weeeks ago.  CPAP machine at night.  Dentist, tongue was hurting and took some abx.    His symptoms are entirely isolated to the tongue and the mouth.  He is having some burning and he also has some lesions on the underside of his tongue that are white in character.  He has already done a course of oral antibiotics without any significant improvement.  He has seen his dentist twice who recommended he follow-up with our office.  10 days of abx. Colon last week  Just cough - pruckering up in his mouth.  Whole mouth burning.  Trouble eating.   Yest, milk - ? funny  Past Medical History, Surgical History, Social History, Family History, Problem List, Medications, and Allergies have been reviewed and updated if relevant.  Patient Active Problem List   Diagnosis Date Noted  . Past heart attack, 05/11/2014 07/09/2014    Priority: High  . 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 CAD (coronary artery disease), native coronary artery 07/09/2014    Priority: High  . Barrett's esophagus 07/04/2016  . Peripheral neuropathy 01/05/2015  . Hyperlipidemia   . Unilateral recurrent femoral hernia without obstruction or gangrene 07/10/2014  . Hypertension   . GERD (gastroesophageal reflux disease)   . Arthritis     Past Medical History:  Diagnosis Date  . Arthritis   . Barrett's esophagus 07/04/2016  . CAD (coronary artery disease),  native coronary artery 05/2014   Non-ST elevation myocardial infarction. Echocardiogram showed normal LV systolic function with mild to moderate mitral regurgitation. Cardiac catheterization showed significant two-vessel coronary artery disease including the RCA and left anterior descending artery. He underwent CABG at [facility].  . Colon polyps   . GERD (gastroesophageal reflux disease)   . Hyperlipidemia   . Hypertension   . MI (myocardial infarction) ([place])   . Past heart attack, 05/11/2014 07/09/2014  . 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 CARDIAC CATHETERIZATION  03/2014   [facility]  . CORONARY ARTERY BYPASS GRAFT  05/2014   [facility], CABG x 2  . EYE SURGERY    . HEMORROIDECTOMY    . LUNG BIOPSY  2006   [facility]    Social History   Socioeconomic History  . Marital status: Married    Spouse name: Not on file  . Number of children: Not on file  . Years of education: Not on file  . Highest education level: Not on file  Occupational History  . Occupation: retired  Scientific laboratory technician  . Financial resource strain: Not on file  . Food insecurity    Worry: Not on file    Inability: Not on file  . Transportation needs    Medical: Not on file    Non-medical: Not on file  Tobacco Use  . Smoking status: Former Smoker    Years:  1.00    Types: Cigars  . Smokeless tobacco: Never Used  Substance and Sexual Activity  . Alcohol use: Yes    Comment: occ beer  . Drug use: No  . Sexual activity: Yes    Partners: Female  Lifestyle  . Physical activity    Days per week: Not on file    Minutes per session: Not on file  . Stress: Not on file  Relationships  . Social Herbalist on phone: Not on file    Gets together: Not on file    Attends religious service: Not on file    Active member of club or organization: Not on file    Attends meetings of clubs or organizations: Not on file    Relationship status: Not on file  . Intimate partner violence     Fear of current or ex partner: Not on file    Emotionally abused: Not on file    Physically abused: Not on file    Forced sexual activity: Not on file  Other Topics Concern  . Not on file  Social History Narrative  . Not on file    Family History  Problem Relation Age of Onset  . Stroke Mother   . Hypertension Mother     Allergies  Allergen Reactions  . Plasma Protein Fraction Anaphylaxis    Whole body rash, hypotension intra-operatively during CABG, responded to benadryl and famotidine  . Aspirin Hives    Per pt, able to tolerate low dose aspirin.   Marland Kitchen Morphine And Related Other (See Comments)    hallucanations    Medication list reviewed and updated in full in [place].   GEN: No acute illnesses, no fevers, chills. GI: No n/v/d, eating normally Pulm: No SOB Interactive and getting along well at home.  Otherwise, ROS is as per the HPI.  Objective:   BP 138/82   Pulse 70   Temp 98.2 F (36.8 C) (Temporal)   Ht 5\' 8"  (1.727 m)   Wt 189 lb (85.7 kg)   SpO2 98%   BMI 28.74 kg/m   GEN: WDWN, NAD, Non-toxic, A & O x 3 HEENT: Atraumatic, Normocephalic. Neck supple. No masses, No LAD.  He does have some small white elevation on the bottom of his tongue.  I do not see any petechiae or any other topical changes in the inside of his mouth, oropharynx, or cheeks. Ears and Nose: No external deformity. CV: RRR, No M/G/R. No JVD. No thrill. No extra heart sounds. PULM: CTA B, no wheezes, crackles, rhonchi. No retractions. No resp. distress. No accessory muscle use. EXTR: No c/c/e NEURO Normal gait.  PSYCH: Normally interactive. Conversant. Not depressed or anxious appearing.  Calm demeanor.   Laboratory and Imaging Data:  Assessment and Plan:     ICD-10-CM   1. Mouth pain  K13.79   2. Tongue lesion  K14.8    I suspect that this is viral in character.  I looked at the medical literature and it is theoretically possible that this could be a manifestation of  COVID-19, so asked him to quarantine and obtain COVID-19 testing today.  Follow-up: No follow-ups on file.  No orders of the defined types were placed in this encounter.  No orders of the defined types were placed in this encounter.   Signed,  Maud Deed. Josalyn Dettmann, MD   Outpatient Encounter Medications as of 09/11/2019  Medication Sig  . amLODipine (NORVASC) 5 MG tablet TAKE 1 TABLET  BY MOUTH  DAILY  . atorvastatin (LIPITOR) 40 MG tablet TAKE 1 TABLET BY MOUTH  DAILY  . carvedilol (COREG) 6.25 MG tablet TAKE 1 TABLET BY MOUTH TWO  TIMES DAILY  . clopidogrel (PLAVIX) 75 MG tablet TAKE 1 TABLET BY MOUTH ONCE DAILY  . ibuprofen (ADVIL) 800 MG tablet TAKE 1 TABLET BY MOUTH EVERY 6 TO 8 HOURS AS NEEDED FOR PAIN  . losartan (COZAAR) 100 MG tablet Take 1 tablet (100 mg total) by mouth daily.  Marland Kitchen omeprazole (PRILOSEC) 20 MG capsule Take 20 mg by mouth daily.  . SODIUM FLUORIDE 5000 SENSITIVE 1.1-5 % PSTE BRUSH ON TEETH FOR 3 5 MINUTES THEN SPIT OUT ONCE PER DAY   No facility-administered encounter medications on file as of 09/11/2019.

## 2019-09-12 DIAGNOSIS — H903 Sensorineural hearing loss, bilateral: Secondary | ICD-10-CM | POA: Diagnosis not present

## 2019-09-12 DIAGNOSIS — K120 Recurrent oral aphthae: Secondary | ICD-10-CM | POA: Diagnosis not present

## 2019-09-13 ENCOUNTER — Other Ambulatory Visit: Payer: Self-pay | Admitting: Cardiovascular Disease

## 2019-10-02 DIAGNOSIS — G4733 Obstructive sleep apnea (adult) (pediatric): Secondary | ICD-10-CM | POA: Diagnosis not present

## 2019-10-09 DIAGNOSIS — H04123 Dry eye syndrome of bilateral lacrimal glands: Secondary | ICD-10-CM | POA: Diagnosis not present

## 2019-10-09 DIAGNOSIS — Z961 Presence of intraocular lens: Secondary | ICD-10-CM | POA: Diagnosis not present

## 2019-10-09 DIAGNOSIS — H0102B Squamous blepharitis left eye, upper and lower eyelids: Secondary | ICD-10-CM | POA: Diagnosis not present

## 2019-10-09 DIAGNOSIS — H0102A Squamous blepharitis right eye, upper and lower eyelids: Secondary | ICD-10-CM | POA: Diagnosis not present

## 2019-10-09 DIAGNOSIS — H2512 Age-related nuclear cataract, left eye: Secondary | ICD-10-CM | POA: Diagnosis not present

## 2019-10-31 ENCOUNTER — Telehealth: Payer: Self-pay | Admitting: Internal Medicine

## 2019-10-31 NOTE — Telephone Encounter (Signed)
Noted.  Will route to DK as an FYI.

## 2019-11-02 DIAGNOSIS — G4733 Obstructive sleep apnea (adult) (pediatric): Secondary | ICD-10-CM | POA: Diagnosis not present

## 2019-11-05 ENCOUNTER — Telehealth: Payer: Self-pay | Admitting: Internal Medicine

## 2019-11-05 NOTE — Telephone Encounter (Signed)
Called and checked with the [facility] and they do not have this CMN. Called and left VM with [name] and Robin Bullins with [employer] requesting CMN to be re-faxed to the [facility] [phone]. Waiting on return call. Rhonda J Cobb

## 2019-11-05 NOTE — Telephone Encounter (Signed)
Rx for cpap supplies has been received and placed in Dr. Zoila Shutter folder for signature.

## 2019-11-05 NOTE — Telephone Encounter (Signed)
Called and spoke to pt, who stated that [employer] has faxed over a Rx for cpap supplies. It does not appear that we have receive a Rx.   Suanne Marker, can you help with pt. Thank you!

## 2019-11-06 ENCOUNTER — Encounter: Payer: Self-pay | Admitting: Family Medicine

## 2019-11-07 NOTE — Telephone Encounter (Signed)
LMOVM for Carlos Erickson with [employer] that CMN has been signed and sent to [employer].  Called and spoke with pt'Erickson wife and she and pt are aware that this has been completed on our in.  Advised patient that if he doesn't hear from [employer] or get his CPAP Supplies to return my call to [phone]. Nothing else needed at this time. Rhonda J Cobb

## 2019-11-13 ENCOUNTER — Telehealth: Payer: Self-pay | Admitting: Internal Medicine

## 2019-11-13 NOTE — Telephone Encounter (Signed)
Spoke with patient and he stated that [employer] was needing CMN signed. We had no record of receiving CMN and so another CMN was requested.  (See previous phone note) We received a PAP Supply CMN and faxed this back on 11/07/2019.  I have sent a CM to [name] and Dimas Chyle as an urgent request as to the status of these supplies, as patient is now unable to use his CPAP due to his mask due to it being broken.  Pt was told by [employer] that once this was received he would have these supplies within 2 days. Waiting on response from [employer]. Rhonda J Cobb

## 2019-11-14 NOTE — Telephone Encounter (Signed)
Spoke with patient and he still has not heard anything from [employer] about his supplies.  CMN faxed to [facility] personal fax with correction made. Rhonda J Cobb

## 2019-11-18 ENCOUNTER — Other Ambulatory Visit: Payer: Self-pay | Admitting: Cardiovascular Disease

## 2019-11-18 DIAGNOSIS — G4733 Obstructive sleep apnea (adult) (pediatric): Secondary | ICD-10-CM | POA: Diagnosis not present

## 2019-11-18 NOTE — Telephone Encounter (Signed)
Called and spoke with patient and he stated that [employer] did speak with him on Friday and advised that the incorrect CMN was signed.  A new CMN is in Dr. Mortimer Fries folder to sign for the additional supplies and new mask that he wants to try.  I explained to patient that Dr. Mortimer Fries was in ICU this week and we are working on getting this signed and sent in to [employer] this week. Carlos Levering Cobb Patient voiced understanding and is aware of what is needed at this point. Rhonda J Cobb

## 2019-11-19 NOTE — Telephone Encounter (Signed)
New CMN signed and sent to [facility]. Melissa stated that [employer] did mail out him a CPAP Mask yesterday from the old CMN.  I called the patient and advised that the new CMN has been signed and was faxed at 8:15 am this morning to [employer].  Spoke with Melissa at [facility] and she has the CMN and will forward to the correct department.  Rhonda J Cobb

## 2019-11-21 NOTE — Telephone Encounter (Signed)
Spoke with [name] this afternoon and she has advised Resupply Team to reach out to patient to check on supplies that were received and if anything else is needed. [employer] is to contact patient per Melissa. Rhonda J Cobb

## 2019-11-21 NOTE — Telephone Encounter (Signed)
Carlos Erickson called me this morning and stated that he received some of his supplies but not all. Stating that he did not receive the clips that hold the nasal pillows on and did not receive the chin strap which was on the CMN order that Dr. Mortimer Fries signed.  I LMOVM for [name] with [employer] to please have someone contact patient to review his order with him and to furnish the other supplies he didn't receive. Waiting to hear back from [name] Rhonda J Cobb

## 2019-11-27 ENCOUNTER — Telehealth: Payer: Self-pay | Admitting: Cardiovascular Disease

## 2019-11-27 NOTE — Telephone Encounter (Signed)
Patient calling Has an appointment tomorrow afternoon with Dr Fletcher Anon Wants to know if labs can be ordered so he can fast and have lab work done at appointment Please call to discus

## 2019-11-27 NOTE — Telephone Encounter (Signed)
Spoke with the patient. His appt is scheduled on 11/28/19 @ 1:30pm with Dr. Fletcher Anon. Patient sts that he plan on coming to his appt fasting and would like to have his fasting labs drawn at that time. Advised the patient that I will put the update for labs needs in his appt notes.

## 2019-11-28 ENCOUNTER — Ambulatory Visit (INDEPENDENT_AMBULATORY_CARE_PROVIDER_SITE_OTHER): Payer: Medicare Other | Admitting: Cardiovascular Disease

## 2019-11-28 ENCOUNTER — Encounter: Payer: Self-pay | Admitting: Cardiovascular Disease

## 2019-11-28 ENCOUNTER — Other Ambulatory Visit: Payer: Self-pay

## 2019-11-28 VITALS — BP 158/90 | HR 65 | Ht 68.0 in | Wt 194.5 lb

## 2019-11-28 DIAGNOSIS — I2510 Atherosclerotic heart disease of native coronary artery without angina pectoris: Secondary | ICD-10-CM | POA: Diagnosis not present

## 2019-11-28 DIAGNOSIS — I10 Essential (primary) hypertension: Secondary | ICD-10-CM | POA: Diagnosis not present

## 2019-11-28 DIAGNOSIS — Z139 Encounter for screening, unspecified: Secondary | ICD-10-CM | POA: Diagnosis not present

## 2019-11-28 DIAGNOSIS — E785 Hyperlipidemia, unspecified: Secondary | ICD-10-CM

## 2019-11-28 NOTE — Progress Notes (Signed)
Cardiology Office Note   Date:  11/28/2019   ID:  NIK GORRELL, DOB 28-May-1951, MRN [id number]  PCP:  Owens Loffler, MD  Cardiologist:   Kathlyn Sacramento, MD   Chief Complaint  Patient presents with  . office visit    6 month F/U with labs; Meds verbally reviewed with patient.      History of Present Illness: Carlos Erickson is a 69 y.o. male who presents for a follow-up visit regarding coronary artery disease and hypertension. He had non-ST elevation myocardial infarction in August 2015.  Cardiac catheterization showed significant complex two-vessel coronary artery disease including the LAD and RCA. He underwent CABG at [facility].  Ejection fraction was normal by echo.  Other medical issues include hypertension and hyperlipidemia. Treadmill stress test in 2017 was normal. He was noted to be hypertensive during last visit and the dose of losartan was increased.  He was also suspected of having sleep apnea and was referred to pulmonary.  Testing showed moderate sleep apnea and he was started on CPAP.  However, he has not been using CPAP regularly due to problems with the CPAP machine parts.  He is getting that fixed.  He has been doing well with no recent chest pain, shortness of breath or palpitations.  Blood pressure is elevated today but he reports that his blood pressure at home is around  482 mm Hg for systolic blood pressure.  Past Medical History:  Diagnosis Date  . Arthritis   . Barrett's esophagus 07/04/2016  . CAD (coronary artery disease), native coronary artery 05/2014   Non-ST elevation myocardial infarction. Echocardiogram showed normal LV systolic function with mild to moderate mitral regurgitation. Cardiac catheterization showed significant two-vessel coronary artery disease including the RCA and left anterior descending artery. He underwent CABG at [facility].  . Colon polyps   . GERD (gastroesophageal reflux disease)   . Hyperlipidemia   . Hypertension   . MI (myocardial  infarction) ([place])   . Past heart attack, 05/11/2014 07/09/2014  . 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 CARDIAC CATHETERIZATION  03/2014   [facility]  . CORONARY ARTERY BYPASS GRAFT  05/2014   [facility], CABG x 2  . EYE SURGERY    . HEMORROIDECTOMY    . LUNG BIOPSY  2006   [facility]     Current Outpatient Medications  Medication Sig Dispense Refill  . amLODipine (NORVASC) 5 MG tablet TAKE 1 TABLET BY MOUTH  DAILY 90 tablet 0  . atorvastatin (LIPITOR) 40 MG tablet TAKE 1 TABLET BY MOUTH  DAILY 90 tablet 1  . carvedilol (COREG) 6.25 MG tablet TAKE 1 TABLET BY MOUTH  TWICE DAILY 180 tablet 0  . clopidogrel (PLAVIX) 75 MG tablet TAKE 1 TABLET BY MOUTH ONCE DAILY 90 tablet 3  . ibuprofen (ADVIL) 800 MG tablet TAKE 1 TABLET BY MOUTH EVERY 6 TO 8 HOURS AS NEEDED FOR PAIN    . losartan (COZAAR) 100 MG tablet Take 1 tablet (100 mg total) by mouth daily. 90 tablet 3  . omeprazole (PRILOSEC) 20 MG capsule Take 20 mg by mouth daily.    . SODIUM FLUORIDE 5000 SENSITIVE 1.1-5 % PSTE BRUSH ON TEETH FOR 3 5 MINUTES THEN SPIT OUT ONCE PER DAY     No current facility-administered medications for this visit.    Allergies:   Plasma protein fraction, Aspirin, and Morphine and related    Social History:  The patient  reports  that he has quit smoking. His smoking use included cigars. He quit after 1.00 year of use. He has never used smokeless tobacco. He reports current alcohol use. He reports that he does not use drugs.   Family History:  The patient's family history includes Hypertension in his mother; Stroke in his mother.    ROS:  Please see the history of present illness.   Otherwise, review of systems are positive for none.   All other systems are reviewed and negative.    PHYSICAL EXAM: VS:  BP (!) 158/90 (BP Location: Left Arm, Patient Position: Sitting, Cuff Size: Normal)   Pulse 65   Ht 5\' 8"  (1.727 m)   Wt 194 lb 8 oz (88.2 kg)   SpO2 96%   BMI 29.57 kg/m  ,  BMI Body mass index is 29.57 kg/m. GEN: Well nourished, well developed, in no acute distress  HEENT: normal  Neck: no JVD, carotid bruits, or masses Cardiac: RRR; no murmurs, rubs, or gallops,no edema  Respiratory:  clear to auscultation bilaterally, normal work of breathing GI: soft, nontender, nondistended, + BS MS: no deformity or atrophy  Skin: warm and dry, no rash Neuro:  Strength and sensation are intact Psych: euthymic mood, full affect   EKG:  EKG is ordered today. The ekg ordered today demonstrates normal sinus rhythm with no significant ST or T wave changes.   Recent Labs: No results found for requested labs within last 8760 hours.    Lipid Panel    Component Value Date/Time   CHOL 127 07/05/2018 0802   CHOL 125 01/18/2018 1151   CHOL 184 05/12/2014 0256   TRIG 239.0 (H) 07/05/2018 0802   TRIG 567 (H) 05/12/2014 0256   HDL 36.20 (L) 07/05/2018 0802   HDL 42 01/18/2018 1151   HDL 26 (L) 05/12/2014 0256   CHOLHDL 4 07/05/2018 0802   VLDL 47.8 (H) 07/05/2018 0802   VLDL SEE COMMENT 05/12/2014 0256   LDLCALC 55 01/18/2018 1151   LDLCALC SEE COMMENT 05/12/2014 0256   LDLDIRECT 77.0 07/05/2018 0802      Wt Readings from Last 3 Encounters:  11/28/19 194 lb 8 oz (88.2 kg)  09/11/19 189 lb (85.7 kg)  02/28/19 192 lb (87.1 kg)         ASSESSMENT AND PLAN:   1.  Coronary artery disease involving native coronary arteries :  He is doing well overall with no anginal symptoms. Continue medical therapy. He is on long-term Plavix instead of aspirin due to allergy.   2. Hypertension:   Blood pressure is elevated here but seems to be reasonably controlled at home.  Not able to increase the dose of carvedilol due to intermittent bradycardia.  We will check routine labs today.  We can consider adding a small dose thiazide diuretic to losartan in the future if needed.  3. Hyperlipidemia: Continue treatment with atorvastatin.  Most recent lipid profile showed an LDL  of 77.  I requested lipid and liver profile.  We should consider adding Vascepa if triglycerides are still elevated.  4.    Moderate obstructive sleep apnea: Started CPAP last year.  I discussed with him the importance of using this on a regular basis.   Disposition:   FU with me in 6 months  Signed,  Kathlyn Sacramento, MD  11/28/2019 1:34 PM    [facility]

## 2019-11-28 NOTE — Patient Instructions (Signed)
Medication Instructions:  Your physician recommends that you continue on your current medications as directed. Please refer to the Current Medication list given to you today.  *If you need a refill on your cardiac medications before your next appointment, please call your pharmacy*  Lab Work: Lipid, [place] today If you have labs (blood work) drawn today and your tests are completely normal, you will receive your results only by: Marland Kitchen MyChart Message (if you have MyChart) OR . A paper copy in the mail If you have any lab test that is abnormal or we need to change your treatment, we will call you to review the results.  Testing/Procedures: None ordered  Follow-Up: At [facility], you and your health needs are our priority.  As part of our continuing mission to provide you with exceptional heart care, we have created designated Provider Care Teams.  These Care Teams include your primary Cardiologist (physician) and Advanced Practice Providers (APPs -  Physician Assistants and Nurse Practitioners) who all work together to provide you with the care you need, when you need it.  Your next appointment:   6 month(s)  The format for your next appointment:   In Person  Provider:    You may see Kathlyn Sacramento, MD or one of the following Advanced Practice Providers on your designated Care Team:    Murray Hodgkins, NP  Christell Faith, PA-C  Marrianne Mood, PA-C   Other Instructions N/A

## 2019-11-29 ENCOUNTER — Telehealth: Payer: Self-pay

## 2019-11-29 LAB — COMPREHENSIVE METABOLIC PANEL
ALT: 25 IU/L (ref 0–44)
AST: 21 IU/L (ref 0–40)
Albumin/Globulin Ratio: 1.7 (ref 1.2–2.2)
Albumin: 4.6 g/dL (ref 3.8–4.8)
Alkaline Phosphatase: 85 IU/L (ref 39–117)
BUN/Creatinine Ratio: 16 (ref 10–24)
BUN: 16 mg/dL (ref 8–27)
Bilirubin Total: 0.9 mg/dL (ref 0.0–1.2)
CO2: 23 mmol/L (ref 20–29)
Calcium: 9.2 mg/dL (ref 8.6–10.2)
Chloride: 101 mmol/L (ref 96–106)
Creatinine, Ser: 1.01 mg/dL (ref 0.76–1.27)
GFR calc Af Amer: 87 mL/min/{1.73_m2} (ref >59)
GFR calc non Af Amer: 76 mL/min/{1.73_m2} (ref >59)
Globulin, Total: 2.7 g/dL (ref 1.5–4.5)
Glucose: 100 mg/dL — ABNORMAL HIGH (ref 65–99)
Potassium: 4.3 mmol/L (ref 3.5–5.2)
Sodium: 137 mmol/L (ref 134–144)
Total Protein: 7.3 g/dL (ref 6.0–8.5)

## 2019-11-29 LAB — CBC WITH DIFFERENTIAL/PLATELET
Basophils Absolute: 0.1 10*3/uL (ref 0.0–0.2)
Basos: 1 %
EOS (ABSOLUTE): 0.4 10*3/uL (ref 0.0–0.4)
Eos: 5 %
Hematocrit: 45.5 % (ref 37.5–51.0)
Hemoglobin: 16.1 g/dL (ref 13.0–17.7)
Immature Grans (Abs): 0 10*3/uL (ref 0.0–0.1)
Immature Granulocytes: 0 %
Lymphocytes Absolute: 2.4 10*3/uL (ref 0.7–3.1)
Lymphs: 32 %
MCH: 31.9 pg (ref 26.6–33.0)
MCHC: 35.4 g/dL (ref 31.5–35.7)
MCV: 90 fL (ref 79–97)
Monocytes Absolute: 0.7 10*3/uL (ref 0.1–0.9)
Monocytes: 10 %
Neutrophils Absolute: 3.9 10*3/uL (ref 1.4–7.0)
Neutrophils: 52 %
Platelets: 261 10*3/uL (ref 150–450)
RBC: 5.04 x10E6/uL (ref 4.14–5.80)
RDW: 12.6 % (ref 11.6–15.4)
WBC: 7.5 10*3/uL (ref 3.4–10.8)

## 2019-11-29 LAB — HEMOGLOBIN A1C
Est. average glucose Bld gHb Est-mCnc: 123 mg/dL
Hgb A1c MFr Bld: 5.9 % — ABNORMAL HIGH (ref 4.8–5.6)

## 2019-11-29 LAB — LIPID PANEL
Chol/HDL Ratio: 3.7 ratio (ref 0.0–5.0)
Cholesterol, Total: 157 mg/dL (ref 100–199)
HDL: 42 mg/dL (ref >39)
LDL Chol Calc (NIH): 78 mg/dL (ref 0–99)
Triglycerides: 225 mg/dL — ABNORMAL HIGH (ref 0–149)
VLDL Cholesterol Cal: 37 mg/dL (ref 5–40)

## 2019-11-29 NOTE — Telephone Encounter (Signed)
-----   Message from Carlos Hampshire, MD sent at 11/29/2019 11:05 AM EST ----- Inform patient that labs showed normal CBC, renal and liver function.   Hemoglobin A1c is borderline elevated.  I am forwarding this to Dr. Lorelei Pont to make him aware.  Likely needs healthy diet and exercise at this point. His cholesterol was good but triglyceride continues to be elevated.  Recommend adding Vascepa 2 g twice daily.

## 2019-11-29 NOTE — Telephone Encounter (Signed)
Called to give the patient lab results and Dr. Arida's recommendation. Lmtcb. 

## 2019-12-03 DIAGNOSIS — G4733 Obstructive sleep apnea (adult) (pediatric): Secondary | ICD-10-CM | POA: Diagnosis not present

## 2019-12-04 DIAGNOSIS — G4733 Obstructive sleep apnea (adult) (pediatric): Secondary | ICD-10-CM | POA: Diagnosis not present

## 2019-12-06 ENCOUNTER — Other Ambulatory Visit: Payer: Self-pay | Admitting: Cardiovascular Disease

## 2019-12-13 NOTE — Telephone Encounter (Signed)
2nd attempt to contact the patient. DPR on file. lmom with Dr. Tyrell Antonio recommendation regarding the patient starting Vascepa. Patient is to contact the office to let us know if he is in agreement so that an Rx can be sent to his pharmacy.

## 2019-12-13 NOTE — Telephone Encounter (Signed)
-----   Message from Wellington Hampshire, MD sent at 11/29/2019 11:05 AM EST ----- Inform patient that labs showed normal CBC, renal and liver function.   Hemoglobin A1c is borderline elevated.  I am forwarding this to Dr. Lorelei Pont to make him aware.  Likely needs healthy diet and exercise at this point. His cholesterol was good but triglyceride continues to be elevated.  Recommend adding Vascepa 2 g twice daily.

## 2019-12-19 ENCOUNTER — Ambulatory Visit: Payer: Medicare Other

## 2019-12-19 ENCOUNTER — Ambulatory Visit: Payer: Medicare Other | Attending: Internal Medicine

## 2019-12-19 DIAGNOSIS — Z23 Encounter for immunization: Secondary | ICD-10-CM

## 2019-12-19 NOTE — Progress Notes (Signed)
   Covid-19 Vaccination Clinic  Name:  NUEL DEJAYNES    MRN: [id number] DOB: 1951-10-06  12/19/2019  Mr. Faniel was observed post Covid-19 immunization for 15 minutes without incidence. He was provided with Vaccine Information Sheet and instruction to access the V-Safe system.   Mr. Dileonardo was instructed to call 911 with any severe reactions post vaccine: Marland Kitchen Difficulty breathing  . Swelling of your face and throat  . A fast heartbeat  . A bad rash all over your body  . Dizziness and weakness    Immunizations Administered    Name Date Dose VIS Date Route   Pfizer COVID-19 Vaccine 12/19/2019 11:23 AM 0.3 mL 10/04/2019 Intramuscular   Manufacturer: [place]   Lot: [id number]   [place]: [id number]

## 2019-12-31 DIAGNOSIS — G4733 Obstructive sleep apnea (adult) (pediatric): Secondary | ICD-10-CM | POA: Diagnosis not present

## 2020-01-06 ENCOUNTER — Telehealth: Payer: Self-pay

## 2020-01-06 NOTE — Telephone Encounter (Signed)
Error

## 2020-01-07 DIAGNOSIS — G4733 Obstructive sleep apnea (adult) (pediatric): Secondary | ICD-10-CM | POA: Diagnosis not present

## 2020-01-14 ENCOUNTER — Ambulatory Visit: Payer: Medicare Other | Attending: Internal Medicine

## 2020-01-14 DIAGNOSIS — Z23 Encounter for immunization: Secondary | ICD-10-CM

## 2020-01-14 NOTE — Progress Notes (Signed)
   Covid-19 Vaccination Clinic  Name:  Carlos Erickson    MRN: [id number] DOB: 11-15-1950  01/14/2020  Carlos Erickson was observed post Covid-19 immunization for 15 minutes without incident. He was provided with Vaccine Information Sheet and instruction to access the V-Safe system.   Carlos Erickson was instructed to call 911 with any severe reactions post vaccine: Marland Kitchen Difficulty breathing  . Swelling of face and throat  . A fast heartbeat  . A bad rash all over body  . Dizziness and weakness   Immunizations Administered    Name Date Dose VIS Date Route   Pfizer COVID-19 Vaccine 01/14/2020 11:43 AM 0.3 mL 10/04/2019 Intramuscular   Manufacturer: [place]   Lot: [id number]   [place]: [id number]

## 2020-01-27 ENCOUNTER — Telehealth: Payer: Self-pay | Admitting: Family Medicine

## 2020-01-27 ENCOUNTER — Other Ambulatory Visit: Payer: Self-pay | Admitting: Family Medicine

## 2020-01-27 DIAGNOSIS — R200 Anesthesia of skin: Secondary | ICD-10-CM

## 2020-01-27 NOTE — Telephone Encounter (Signed)
Mr. Carlos Erickson notified as instructed by telephone.  Patient states understanding.

## 2020-01-27 NOTE — Telephone Encounter (Signed)
Patient called today He was previously seen in June for ulcers in his mouth. Patient stated those have gone but now he has been having numbness in his lips and some in his nose.  Patient wanted to see if he could be referred to [facility]  Dr Jaynee Eagles.  Please advise

## 2020-01-27 NOTE — Telephone Encounter (Signed)
He may hear from Elsie directly - likely so

## 2020-01-27 NOTE — Progress Notes (Signed)
Per patient call

## 2020-01-31 DIAGNOSIS — G4733 Obstructive sleep apnea (adult) (pediatric): Secondary | ICD-10-CM | POA: Diagnosis not present

## 2020-03-01 DIAGNOSIS — G4733 Obstructive sleep apnea (adult) (pediatric): Secondary | ICD-10-CM | POA: Diagnosis not present

## 2020-03-02 ENCOUNTER — Ambulatory Visit: Payer: Self-pay | Admitting: Neurology

## 2020-03-03 ENCOUNTER — Other Ambulatory Visit: Payer: Self-pay | Admitting: Cardiovascular Disease

## 2020-03-11 NOTE — Progress Notes (Signed)
[facility]    Provider:  Dr Jaynee Eagles Requesting Provider: Owens Loffler, MD Primary Care Provider:  Owens Loffler, MD  CC:  Numbness of the lips  HPI:  Carlos Erickson is a 69 y.o. male here as requested by Owens Loffler, MD for face and lip numbness. PMHx coronary artery disease status post CABG x2, myocardial infarction, hypertension, hyperlipidemia, GERD, Barrett's esophagus and arthritis, peripheral neuropathy.  I reviewed Dr. Lillie Fragmin notes, he was previously seen in June for ulcers in his mouth but after those went away he was having numbness in his lips and some in his nose, he asked to be referred to neurology.    In November 2020 he did complain of pain to the dentist tongue was hurting took some antibiotics, CPAP machine at night, symptoms entirely isolated to the tongue and the mouth, some burning and also some lesions on the other side of his tongue.  Also has some lesions on the underside of his tongue waiting character, he saw a dentist twice.  His whole mouth was burning and trouble eating.  On examination Dr. Lorelei Pont saw some small white elevation in the bottom of his tongue nothing else inside his mouth oropharynx or cheeks, the rest of his physical exam was normal.  Suspicion was viral etiology, also per documentation in medical literature theoretically possible could be a manifestation of COVID-19 and he was sent for COVID-19 testing.  I reviewed prior notes in 2020 and did not see any previous mention of this.  Dr. Lillie Fragmin notes have documented a normal exam including musculoskeletal, neuro and psych.  I see 1 test for coronavirus in August 2021 which was not detected by oropharyngeal swab.  Hemoglobin A1c in February of this year was 5.9, CBC and c-Met were unremarkable at that same time.  Started a year ago, he went to the dentist and he started a cpap machine at the time. After the dentist for a filling he had ulcers under his tongue, the dentist  didn't know what it was and he went to his pcp and he didn;t know either and thought it was Covid or a virus. He did not get checked for Covid prior in Augist but he did not go again. After that the ulcers stayed there for a month, he went to an ENT and he looked at it and gave him a nouthwash, they did eventually go away but then he had burning mouth syndrome and he thinks it may be due to antibiotics from the dentist he doesn't know, the whole mouth, roof, tongue nt eback of the throt, just when eating, not the lips. That went away after a week or two. He started having chapped lips then numbness in the lips top and bottom. He thought it may be the cpap full mask and he got nose pillows and then the tip of his nose became numb. He felt something was not right in his mouth, getting better but it would feel like his mouth was puckering strange sensations like he ate a lemon and it puckered him. His lips are still numb and he feels he can't open his mouth wide like his mouth is stretching too much. 1-3 beers a day. No changes in speech, no problems in the face, no vision changes, no other sensory changes or numbness, no weakness.   Reviewed notes, labs and imaging from outside physicians, which showed:  See above  Review of Systems: Patient complains of symptoms per HPI as well as the following  symptoms: numbness. Pertinent negatives and positives per HPI. All others negative.   Social History   Socioeconomic History  . Marital status: Married    Spouse name: Not on file  . Number of children: 2  . Years of education: Not on file  . Highest education level: High school graduate  Occupational History  . Occupation: retired  Tobacco Use  . Smoking status: Former Smoker    Years: 1.00    Types: Cigars    Quit date: 2015    Years since quitting: 6.3  . Smokeless tobacco: Never Used  Substance and Sexual Activity  . Alcohol use: Yes    Alcohol/week: 7.0 - 21.0 standard drinks    Types: 7 - 21  Cans of beer per week    Comment: beers 1-3 daily   . Drug use: No  . Sexual activity: Yes    Partners: Female  Other Topics Concern  . Not on file  Social History Narrative   Lives at home with wife   Right handed   Caffeine: 1-2 cups of coffee per day   Social Determinants of Health   Financial Resource Strain:   . Difficulty of Paying Living Expenses:   Food Insecurity:   . Worried About Charity fundraiser in the Last Year:   . Arboriculturist in the Last Year:   Transportation Needs:   . Film/video editor (Medical):   Marland Kitchen Lack of Transportation (Non-Medical):   Physical Activity:   . Days of Exercise per Week:   . Minutes of Exercise per Session:   Stress:   . Feeling of Stress :   Social Connections:   . Frequency of Communication with Friends and Family:   . Frequency of Social Gatherings with Friends and Family:   . Attends Religious Services:   . Active Member of Clubs or Organizations:   . Attends Archivist Meetings:   Marland Kitchen Marital Status:   Intimate Partner Violence:   . Fear of Current or Ex-Partner:   . Emotionally Abused:   Marland Kitchen Physically Abused:   . Sexually Abused:     Family History  Problem Relation Age of Onset  . Stroke Mother   . Hypertension Mother   . Heart Problems Mother   . Diabetes Mother     Past Medical History:  Diagnosis Date  . Arthritis   . Barrett's esophagus 07/04/2016   pt states he was told he does not have this.  Marland Kitchen CAD (coronary artery disease), native coronary artery 05/2014   Non-ST elevation myocardial infarction. Echocardiogram showed normal LV systolic function with mild to moderate mitral regurgitation. Cardiac catheterization showed significant two-vessel coronary artery disease including the RCA and left anterior descending artery. He underwent CABG at [facility].  . Colon polyps   . GERD (gastroesophageal reflux disease)   . Hyperlipidemia   . Hypertension   . MI (myocardial infarction) ([place])   . Past heart  attack, 05/11/2014 07/09/2014  . Prediabetes   . 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 Sleep apnea 03/2019   on CPAP    Patient Active Problem List   Diagnosis Date Noted  . Lip numbness 03/16/2020  . Mouth ulcer 03/16/2020  . Barrett's esophagus 07/04/2016  . Peripheral neuropathy 01/05/2015  . Hyperlipidemia   . Unilateral recurrent femoral hernia without obstruction or gangrene 07/10/2014  . Past heart attack, 05/11/2014 07/09/2014  . 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 CAD (coronary artery disease), native coronary artery  07/09/2014  . Hypertension   . GERD (gastroesophageal reflux disease)   . Arthritis     Past Surgical History:  Procedure Laterality Date  . CARDIAC CATHETERIZATION  03/2014   [facility]  . cataract Right 1980   Dr. Katy Fitch  . CORONARY ARTERY BYPASS GRAFT  05/2014   [facility], CABG x 2  . EYE SURGERY    . HEMORROIDECTOMY    . LUNG BIOPSY  2006   [facility]    Current Outpatient Medications  Medication Sig Dispense Refill  . Acetaminophen (TYLENOL PO) Take by mouth as needed (aches and pains).    Marland Kitchen amLODipine (NORVASC) 5 MG tablet TAKE 1 TABLET BY MOUTH  DAILY 90 tablet 3  . Aspirin-Acetaminophen-Caffeine (EXCEDRIN PO) Take by mouth as needed (aches and pains).    Marland Kitchen atorvastatin (LIPITOR) 40 MG tablet TAKE 1 TABLET BY MOUTH  DAILY 90 tablet 1  . carvedilol (COREG) 6.25 MG tablet TAKE 1 TABLET BY MOUTH  TWICE DAILY 180 tablet 3  . clopidogrel (PLAVIX) 75 MG tablet TAKE 1 TABLET BY MOUTH ONCE DAILY 90 tablet 3  . ibuprofen (ADVIL) 800 MG tablet TAKE 1 TABLET BY MOUTH EVERY 6 TO 8 HOURS AS NEEDED FOR PAIN    . losartan (COZAAR) 100 MG tablet TAKE 1 TABLET BY MOUTH  DAILY 90 tablet 3  . omeprazole (PRILOSEC) 20 MG capsule Take 20 mg by mouth daily.     No current facility-administered medications for this visit.    Allergies as of 03/12/2020 - Review Complete 03/12/2020  Allergen Reaction Noted  . Plasma protein fraction Anaphylaxis 07/09/2014  . Aspirin Hives 07/14/2014  . Morphine and  related Other (See Comments) 05/20/2013    Vitals: BP (!) 150/90 (BP Location: Left Arm, Patient Position: Sitting)   Pulse 64   Ht 5' 8"  (1.727 m)   Wt 192 lb (87.1 kg)   BMI 29.19 kg/m  Last Weight:  Wt Readings from Last 1 Encounters:  03/12/20 192 lb (87.1 kg)   Last Height:   Ht Readings from Last 1 Encounters:  03/12/20 5' 8"  (1.727 m)    Physical exam: Exam: Gen: NAD, conversant, well nourised, overweight, well groomed                     CV: RRR, no MRG. No Carotid Bruits. No peripheral edema, warm, nontender Eyes: Conjunctivae clear without exudates or hemorrhage  Neuro: Detailed Neurologic Exam  Speech:    Speech is normal; fluent and spontaneous with normal comprehension.  Cognition:    The patient is oriented to person, place, and time;     recent and remote memory intact;     language fluent;     normal attention, concentration,     fund of knowledge Cranial Nerves:   right pupil 3-37m and unreactive (chronic) left 219mand normal. . The fundi are flat.  Visual fields are full to finger confrontation. Extraocular movements are intact. Trigeminal sensation is intact and the muscles of mastication are normal. The face is symmetric. The palate elevates in the midline. Hearing intact. Voice is normal. Shoulder shrug is normal. The tongue has normal motion without fasciculations.   Coordination:    Normal finger to nose and heel to shin.   Gait:    Heel-toe and tandem gait are normal with mild imbalance  Motor Observation:    No asymmetry, no atrophy, and no involuntary movements noted. Tone:    Normal muscle tone.    Posture:  Posture is normal. normal erect    Strength:    Strength is V/V in the upper and lower limbs.      Sensation: intact to LT     Reflex Exam:  DTR's:    Deep tendon reflexes in the upper and lower extremities are symmetrical bilaterally.   Toes:    The toes are downgoing bilaterally.   Clonus:    Clonus is absent.     Assessment/Plan:   69 y.o. male here as requested by Owens Loffler, MD for face and lip numbness. PMHx coronary artery disease status post CABG x2, myocardial infarction, hypertension, hyperlipidemia, GERD, Barrett's esophagus and arthritis, peripheral neuropathy.  I reviewed Dr. Lillie Fragmin notes, he was previously seen in June for ulcers in his mouth but after those went away he was having numbness in his lips and some in his nose  It appears as though patient had a viral illness causing lesions in his mouth followed by numbness in those areas.  The etiology of the numbness is likely viral, it is bilateral along the lips does not really sound like it would be any sort of brain lesion considering that it is bilateral and symmetric however I wonder if there might be some nutritional deficiencies given his alcohol abuse of 3 beers a night especially B6 deficiency I think can cause lesions in the mouth, vitamin C deficiency as well.  I will test him with an extensive panel of blood work, if positive I can give him some vitamin supplements to try, if negative we can proceed to MRI of the brain but I think that may be low yield however should still check and ensure that there is no cranial nerve abnormalities or infiltrative disease.  It does not sound like Behcet's disease.  We will check for a few other rheumatologic conditions.   Orders Placed This Encounter  Procedures  . B12 and Folate Panel  . Methylmalonic acid, serum  . Vitamin B1  . Vitamin B6  . B. burgdorfi Antibody  . ANA, IFA (with reflex)  . Sedimentation rate  . Sjogren's syndrome antibods(ssa + ssb)  . Pan-ANCA  . Heavy metals, blood  . Multiple Myeloma Panel (SPEP&IFE w/QIG)  . RPR  . Zinc  . HSV(herpes simplex vrs) 1+2 ab-IgG  . Vitamin C  . Vitamin D, 25-hydroxy  . Vitamin B2, Whole Blood   No orders of the defined types were placed in this encounter.   Cc: Owens Loffler, MD,    Sarina Ill, MD  [facility] [address] [place], [place] [address]  Phone [phone] Fax [phone]  I spent 65 minutes of face-to-face and non-face-to-face time with patient on the  1. Lip numbness   2. Mouth ulcer    diagnosis.  This included previsit chart review, lab review, study review, order entry, electronic health record documentation, patient education on the different diagnostic and therapeutic options, counseling and coordination of care, risks and benefits of management, compliance, or risk factor reduction

## 2020-03-12 ENCOUNTER — Ambulatory Visit: Payer: Medicare Other | Admitting: Neurology

## 2020-03-12 ENCOUNTER — Other Ambulatory Visit: Payer: Self-pay

## 2020-03-12 ENCOUNTER — Encounter: Payer: Self-pay | Admitting: Neurology

## 2020-03-12 VITALS — BP 150/90 | HR 64 | Ht 68.0 in | Wt 192.0 lb

## 2020-03-12 DIAGNOSIS — E559 Vitamin D deficiency, unspecified: Secondary | ICD-10-CM | POA: Diagnosis not present

## 2020-03-12 DIAGNOSIS — R200 Anesthesia of skin: Secondary | ICD-10-CM | POA: Diagnosis not present

## 2020-03-12 DIAGNOSIS — K121 Other forms of stomatitis: Secondary | ICD-10-CM | POA: Diagnosis not present

## 2020-03-12 DIAGNOSIS — Z139 Encounter for screening, unspecified: Secondary | ICD-10-CM | POA: Diagnosis not present

## 2020-03-12 DIAGNOSIS — E539 Vitamin B deficiency, unspecified: Secondary | ICD-10-CM | POA: Diagnosis not present

## 2020-03-12 DIAGNOSIS — E538 Deficiency of other specified B group vitamins: Secondary | ICD-10-CM | POA: Diagnosis not present

## 2020-03-12 NOTE — Patient Instructions (Signed)
Blood work Then MRI brain is everything is negative

## 2020-03-16 ENCOUNTER — Encounter: Payer: Self-pay | Admitting: Neurology

## 2020-03-16 DIAGNOSIS — R200 Anesthesia of skin: Secondary | ICD-10-CM | POA: Insufficient documentation

## 2020-03-16 DIAGNOSIS — K121 Other forms of stomatitis: Secondary | ICD-10-CM | POA: Insufficient documentation

## 2020-03-18 ENCOUNTER — Telehealth: Payer: Self-pay | Admitting: *Deleted

## 2020-03-18 LAB — MULTIPLE MYELOMA PANEL, SERUM
Albumin SerPl Elph-Mcnc: 4.2 g/dL (ref 2.9–4.4)
Albumin/Glob SerPl: 1.3 (ref 0.7–1.7)
Alpha 1: 0.2 g/dL (ref 0.0–0.4)
Alpha2 Glob SerPl Elph-Mcnc: 0.8 g/dL (ref 0.4–1.0)
B-Globulin SerPl Elph-Mcnc: 1.4 g/dL — ABNORMAL HIGH (ref 0.7–1.3)
Gamma Glob SerPl Elph-Mcnc: 1.1 g/dL (ref 0.4–1.8)
Globulin, Total: 3.5 g/dL (ref 2.2–3.9)
IgA/Immunoglobulin A, Serum: 466 mg/dL — ABNORMAL HIGH (ref 61–437)
IgG (Immunoglobin G), Serum: 1106 mg/dL (ref 603–1613)
IgM (Immunoglobulin M), Srm: 115 mg/dL (ref 20–172)
Total Protein: 7.7 g/dL (ref 6.0–8.5)

## 2020-03-18 LAB — ANTINUCLEAR ANTIBODIES, IFA: ANA Titer 1: NEGATIVE

## 2020-03-18 LAB — B12 AND FOLATE PANEL
Folate: 11.3 ng/mL (ref >3.0)
Vitamin B-12: 469 pg/mL (ref 232–1245)

## 2020-03-18 LAB — METHYLMALONIC ACID, SERUM: Methylmalonic Acid: 78 nmol/L (ref 0–378)

## 2020-03-18 LAB — SEDIMENTATION RATE: Sed Rate: 10 mm/hr (ref 0–30)

## 2020-03-18 LAB — SJOGREN'S SYNDROME ANTIBODS(SSA + SSB)
ENA SSA (RO) Ab: <0.2 AI (ref 0.0–0.9)
ENA SSB (LA) Ab: <0.2 AI (ref 0.0–0.9)

## 2020-03-18 LAB — VITAMIN B2, WHOLE BLOOD: Vitamin B2, Whole Blood: 226 ug/L (ref 137–370)

## 2020-03-18 LAB — ZINC: Zinc: 79 ug/dL (ref 44–115)

## 2020-03-18 LAB — B. BURGDORFI ANTIBODIES: Lyme IgG/IgM Ab: <0.91 {ISR} (ref 0.00–0.90)

## 2020-03-18 LAB — HSV(HERPES SIMPLEX VRS) I + II AB-IGG
HSV 1 Glycoprotein G Ab, IgG: <0.91 index (ref 0.00–0.90)
HSV 2 IgG, Type Spec: <0.91 index (ref 0.00–0.90)

## 2020-03-18 LAB — PAN-ANCA
ANCA Proteinase 3: <3.5 U/mL (ref 0.0–3.5)
Atypical pANCA: <1:20 {titer}
C-ANCA: <1:20 {titer}
Myeloperoxidase Ab: <9 U/mL (ref 0.0–9.0)
P-ANCA: <1:20 {titer}

## 2020-03-18 LAB — HEAVY METALS, BLOOD
Arsenic: 4 ug/L (ref 2–23)
Lead, Blood: 1 ug/dL (ref 0–4)
Mercury: 2.1 ug/L (ref 0.0–14.9)

## 2020-03-18 LAB — RPR: RPR Ser Ql: NONREACTIVE

## 2020-03-18 LAB — VITAMIN C: Vitamin C: 1.2 mg/dL (ref 0.4–2.0)

## 2020-03-18 LAB — VITAMIN B1: Thiamine: 134.1 nmol/L (ref 66.5–200.0)

## 2020-03-18 LAB — VITAMIN B6: Vitamin B6: 5.8 ug/L (ref 5.3–46.7)

## 2020-03-18 LAB — VITAMIN D 25 HYDROXY (VIT D DEFICIENCY, FRACTURES): Vit D, 25-Hydroxy: 31.3 ng/mL (ref 30.0–100.0)

## 2020-03-18 NOTE — Telephone Encounter (Signed)
Called pt & LVM asking for call back. Left office number in message.  °

## 2020-03-18 NOTE — Telephone Encounter (Signed)
-----   Message from Melvenia Beam, MD sent at 03/17/2020  2:32 PM EDT ----- His  labs look fine. B6 is a little low however and B6 deficiency can cause mouth lesions, I recommend B6 over the counter (No more than 50mg  a day) and try it for a month and see if it helps, or a multi-B-Vitamin. I can prescribe something if he likes thanks

## 2020-03-19 NOTE — Telephone Encounter (Signed)
I called the pt and discussed the lab results from Dr. Jaynee Eagles. Pt verbalized understanding. He will try B6 supplement or B complex supplement daily for a month but will not exceed 50 mg of B6 per day. He will update [place] after a month. I told pt to let us know if he  Needs Dr. Jaynee Eagles to prescribe one rather than purchase OTC. He verbalized understanding and appreciation for the call.

## 2020-04-01 DIAGNOSIS — G4733 Obstructive sleep apnea (adult) (pediatric): Secondary | ICD-10-CM | POA: Diagnosis not present

## 2020-05-26 ENCOUNTER — Telehealth: Payer: Self-pay | Admitting: Neurology

## 2020-05-26 DIAGNOSIS — R200 Anesthesia of skin: Secondary | ICD-10-CM

## 2020-05-26 NOTE — Telephone Encounter (Signed)
Pt's wife called stating that she is needing to speak to RN regarding the pt's numbness. She states that it is now spreading up to his nose. Please advise.

## 2020-05-26 NOTE — Telephone Encounter (Signed)
I called the pt and reviewed the office note from last appt. It was noted in ov that patient had lip numbness and some nose numbness. Pt stated the nose numbness was better some days than others. He now feels it might be a little worse. The numbness is located from the tip to halfway up the bridge. The lip numbness is about the same. He is still taking Vitamin B6. He denies any improvement with that but also denies any more mouth lesions. He also wanted to note that he has had a lot of mucous for a long time and uses a nasal spray at night. He also reports his big toes have been numb even before but doesn't feel this is related. He reports he has teeth that weren't capped with signs of decay. The patient mentioned that at office an MRI was discussed as a possibility to r/o cancer. He asked if this is recommended now.

## 2020-05-27 NOTE — Telephone Encounter (Signed)
I called the pt and advised an MRI had been ordered and that auth will be done and he will be called to schedule. I also advised if he worsens to call us and advised that if he were to develop any new numbness ex. One side of face, arm, to call 911 right away as this could be a separate issue. He verbalized understanding and appreciation for the call.

## 2020-05-27 NOTE — Telephone Encounter (Signed)
Orders Placed This Encounter  Procedures  . MR BRAIN W WO CONTRAST    Penni Bombard, MD 05/25/600, 5:61 PM Certified in Neurology, Neurophysiology and Neuroimaging  [facility] [address], [place], [place] [address] [phone]

## 2020-05-27 NOTE — Addendum Note (Signed)
Addended by: Andrey Spearman R on: 05/27/2020 02:52 PM   Modules accepted: Orders

## 2020-05-28 ENCOUNTER — Ambulatory Visit: Payer: Medicare Other | Admitting: Cardiovascular Disease

## 2020-05-28 ENCOUNTER — Telehealth: Payer: Self-pay | Admitting: Diagnostic Neuroimaging

## 2020-05-28 NOTE — Telephone Encounter (Signed)
[employer] medicare order sent to GI. No auth they will reach out to the patient to schedule.  

## 2020-05-30 ENCOUNTER — Ambulatory Visit
Admission: RE | Admit: 2020-05-30 | Discharge: 2020-05-30 | Disposition: A | Discharge status: Home / Self Care | Payer: Medicare Other | Source: Ambulatory Visit | Attending: Diagnostic Neuroimaging | Admitting: Diagnostic Neuroimaging

## 2020-05-30 ENCOUNTER — Other Ambulatory Visit: Payer: Self-pay

## 2020-05-30 DIAGNOSIS — R200 Anesthesia of skin: Secondary | ICD-10-CM

## 2020-05-30 MED ORDER — GADOBENATE DIMEGLUMINE 529 MG/ML IV SOLN
20.0000 mL | Freq: Once | INTRAVENOUS | Status: AC | PRN
  Administered 2020-05-30: 20 mL via INTRAVENOUS

## 2020-06-03 ENCOUNTER — Telehealth: Payer: Self-pay | Admitting: *Deleted

## 2020-06-03 NOTE — Telephone Encounter (Signed)
Spoke with patient and gave him Dr Cathren Laine results. He verbalized understanding, appreciation.

## 2020-06-03 NOTE — Telephone Encounter (Signed)
Please let patient know MRI of the brain is unremarkable for age, nothing seen that can cause his symptoms. Patient was advised durin appointment that the cause of his symptoms was not likely in the brain but we were being thorough.  thanks

## 2020-06-03 NOTE — Telephone Encounter (Signed)
IMPRESSION: This MRI of the brain with and without contrast shows the following: 1.   There are a few scattered T2/FLAIR hyperintense foci in the hemispheres consistent with minimal, age-appropriate microvascular ischemic change. 2.   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  Clinical significance of a single microhemorrhage is uncertain. 3.   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 4.   No acute findings.  Normal enhancement pattern

## 2020-06-10 NOTE — Progress Notes (Signed)
Office Visit    Patient Name: Carlos Erickson Date of Encounter: 06/11/2020  Primary Care Provider:  Owens Loffler, MD Primary Cardiologist:  Kathlyn Sacramento, MD Electrophysiologist:  None   Chief Complaint    Carlos Erickson is a 69 y.o. male with a hx of CAD s/p CABGx2 at [facility] in 2015, HTN, OSA on CPAP, GERD, HLD presents today for follow-up of CAD  Past Medical History    Past Medical History:  Diagnosis Date  . Arthritis   . Barrett's esophagus 07/04/2016   pt states he was told he does not have this.  Marland Kitchen CAD (coronary artery disease), native coronary artery 05/2014   Non-ST elevation myocardial infarction. Echocardiogram showed normal LV systolic function with mild to moderate mitral regurgitation. Cardiac catheterization showed significant two-vessel coronary artery disease including the RCA and left anterior descending artery. He underwent CABG at [facility].  . Colon polyps   . GERD (gastroesophageal reflux disease)   . Hyperlipidemia   . Hypertension   . MI (myocardial infarction) ([place])   . Past heart attack, 05/11/2014 07/09/2014  . Prediabetes   . 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 Sleep apnea 03/2019   on CPAP   Past Surgical History:  Procedure Laterality Date  . CARDIAC CATHETERIZATION  03/2014   [facility]  . cataract Right 1980   Dr. Katy Fitch  . CORONARY ARTERY BYPASS GRAFT  05/2014   [facility], CABG x 2  . EYE SURGERY    . HEMORROIDECTOMY    . LUNG BIOPSY  2006   [facility]    Allergies  Allergies  Allergen Reactions  . Plasma Protein Fraction Anaphylaxis    Whole body rash, hypotension intra-operatively during CABG, responded to benadryl and famotidine  . Aspirin Hives    Per pt, able to tolerate low dose aspirin.   Marland Kitchen Morphine And Related Other (See Comments)    hallucanations    History of Present Illness    Carlos Erickson is a 69 y.o. male with a hx of CAD s/p CABGx2 at [facility] in 2015, HTN, OSA on CPAP, GERD, HLD.  He was last seen 11/28/2019 by Dr. Fletcher Anon.  He had  NSTEMI August 2015.  Cardiac catheterization at the time with significant two-vessel coronary disease including LAD and RCA.  Underwent CABG X to at [facility].  EF was normal by echo.  Treadmill stress test in the 2017 was normal.  Of note he has maintained on long-term Plavix instead of aspirin due to allergy.  Last seen 11/28/2019 by Dr. Fletcher Anon.  He was noting to have difficulty getting CPAP parts though was getting that fixed.  His BP was mildly elevated in clinic but well controlled at home.  His antihypertensive doses were continued.  Subsequent lab work showed elevated triglycerides and he was recommended to start Vascepa.  Of note since that time he has been seen by neurology for face and lip numbness.  Precipitated by a viral illness causing lesions in the mouth followed by numbness in this area.  Etiology was thought to be viral.  He was recommended for over-the-counter vitamin B6.  His MRI of the brain was unrevealing.  Presents today for follow up. Enjoys fishing in his spare time. BP at home has been 140-160/85-95. No formal exercise routine.  Reports no shortness of breath nor dyspnea on exertion. Reports no chest pain, pressure, or tightness. No edema, orthopnea, PND. Reports no palpitations.  Reports difficulties with his Atorvastatin. He has been taking half of his  Atorvastatin for the last year. Endorses joint pain.   EKGs/Labs/Other Studies Reviewed:   The following studies were reviewed today:  EKG:  EKG is  ordered today.  The ekg ordered today demonstrates SB 59 bpm with no acute ST/T wave changes.   Recent Labs: 11/28/2019: ALT 25; BUN 16; Creatinine, Ser 1.01; Hemoglobin 16.1; Platelets 261; Potassium 4.3; Sodium 137  Recent Lipid Panel    Component Value Date/Time   CHOL 157 11/28/2019 1349   CHOL 184 05/12/2014 0256   TRIG 225 (H) 11/28/2019 1349   TRIG 567 (H) 05/12/2014 0256   HDL 42 11/28/2019 1349   HDL 26 (L) 05/12/2014 0256   CHOLHDL 3.7 11/28/2019 1349   CHOLHDL 4  07/05/2018 0802   VLDL 47.8 (H) 07/05/2018 0802   VLDL SEE COMMENT 05/12/2014 0256   LDLCALC 78 11/28/2019 1349   LDLCALC SEE COMMENT 05/12/2014 0256   LDLDIRECT 77.0 07/05/2018 0802    Home Medications   Current Meds  Medication Sig  . Acetaminophen (TYLENOL PO) Take by mouth as needed (aches and pains).  . Aspirin-Acetaminophen-Caffeine (EXCEDRIN PO) Take by mouth as needed (aches and pains).  . carvedilol (COREG) 6.25 MG tablet TAKE 1 TABLET BY MOUTH  TWICE DAILY  . clopidogrel (PLAVIX) 75 MG tablet TAKE 1 TABLET BY MOUTH ONCE DAILY  . losartan (COZAAR) 100 MG tablet TAKE 1 TABLET BY MOUTH  DAILY  . omeprazole (PRILOSEC) 20 MG capsule Take 20 mg by mouth daily.  . [DISCONTINUED] amLODipine (NORVASC) 5 MG tablet TAKE 1 TABLET BY MOUTH  DAILY  . [DISCONTINUED] atorvastatin (LIPITOR) 40 MG tablet TAKE 1 TABLET BY MOUTH  DAILY      Review of Systems       Review of Systems  Constitutional: Negative for chills, fever and malaise/fatigue.  HENT:       (+) lip numbness  Cardiovascular: Negative for chest pain, dyspnea on exertion, leg swelling, near-syncope, orthopnea, palpitations and syncope.  Respiratory: Negative for cough, shortness of breath and wheezing.   Gastrointestinal: Negative for nausea and vomiting.  Neurological: Negative for dizziness, light-headedness and weakness.   All other systems reviewed and are otherwise negative except as noted above.  Physical Exam    VS:  BP 140/90 (BP Location: Left Arm, Patient Position: Sitting, Cuff Size: Normal)   Pulse (!) 59   Ht 5\' 8"  (1.727 m)   Wt 197 lb (89.4 kg)   SpO2 98%   BMI 29.95 kg/m  , BMI Body mass index is 29.95 kg/m. GEN: Well nourished, well developed, in no acute distress. HEENT: normal. Neck: Supple, no JVD, carotid bruits, or masses. Cardiac: RRR, no murmurs, rubs, or gallops. No clubbing, cyanosis, edema.  Radials/DP/PT 2+ and equal bilaterally.  Respiratory:  Respirations regular and unlabored,  clear to auscultation bilaterally. GI: Soft, nontender, nondistended, BS + x 4. MS: No deformity or atrophy. Skin: Warm and dry, no rash. Neuro:  Strength and sensation are intact. Psych: Normal affect.  Assessment & Plan    1. CAD s/p CABG - Stable with no anginal symptoms. No indication for ischemic evaluation. GDMT includes Plavix, Coreg, Atorvastatin, PRN nitroglycerin. Low sodium, heart healthy diet encouraged. Regular cardiovascular exercise encouraged.   2. HTN - BP elevated. Continue Losartan 100mg  daily. Increase Amlodipine to 10mg  daily. Continue Coreg 6.25mg  twice daily.  3. HLD - Lipid panel 11/2019 with LDL 78, triglycerides 225. Encouraged to reduce beer intake (presently 2-3 beers per night) as likely contributory to elevated triglycerides and weight. Start  Vascepa. Reduce Atorvastatin to 20mg  daily. Repeat lipid panel in 6 weeks. If LDL not at goal of <70, plan to add Zetia 10mg  daily at that time. Lipid lowering diet encouraged.   4. OSA - Has not been wearing CPAP due to troubles with numbness to lips. Seen by neurology with no acute findings. He has been seen by ENT. Has been referred to oral surgeon by his dentist. Discussed importance of CPAP in setting of his coronary disease and HTN.  Disposition: Follow up in 6 month(s) with Dr. Fletcher Anon or APP   Loel Dubonnet, NP 06/11/2020, 4:43 PM

## 2020-06-11 ENCOUNTER — Other Ambulatory Visit: Payer: Self-pay

## 2020-06-11 ENCOUNTER — Encounter: Payer: Self-pay | Admitting: Family

## 2020-06-11 ENCOUNTER — Ambulatory Visit: Payer: Medicare Other | Admitting: Family

## 2020-06-11 VITALS — BP 140/90 | HR 59 | Ht 68.0 in | Wt 197.0 lb

## 2020-06-11 DIAGNOSIS — I2510 Atherosclerotic heart disease of native coronary artery without angina pectoris: Secondary | ICD-10-CM | POA: Diagnosis not present

## 2020-06-11 DIAGNOSIS — G4733 Obstructive sleep apnea (adult) (pediatric): Secondary | ICD-10-CM | POA: Diagnosis not present

## 2020-06-11 DIAGNOSIS — E785 Hyperlipidemia, unspecified: Secondary | ICD-10-CM | POA: Diagnosis not present

## 2020-06-11 DIAGNOSIS — I10 Essential (primary) hypertension: Secondary | ICD-10-CM | POA: Diagnosis not present

## 2020-06-11 MED ORDER — ICOSAPENT ETHYL 1 G PO CAPS: 2.0000 g | ORAL_CAPSULE | Freq: Two times a day (BID) | ORAL | 3 refills | Status: DC

## 2020-06-11 MED ORDER — AMLODIPINE BESYLATE 10 MG PO TABS: 10.0000 mg | ORAL_TABLET | Freq: Every day | ORAL | 3 refills | Status: DC

## 2020-06-11 MED ORDER — ATORVASTATIN CALCIUM 20 MG PO TABS: 20.0000 mg | ORAL_TABLET | Freq: Every day | ORAL | 3 refills | Status: DC

## 2020-06-11 NOTE — Patient Instructions (Addendum)
Medication Instructions:  Your provider has recommended you make the following change in your medication:   START Vascepa 2g twice daily  CHANGE Atorvastatin to 20mg  daily *this to help prevent your joint pains*  CHANGE Amlodipine to 10mg  daily *this is to get your blood pressure to goal of less than 130/80*  *If you need a refill on your cardiac medications before your next appointment, please call your pharmacy*   Lab Work: Your provider recommends that you return for lab work in 6 weeks: CMET, lipid panel  Please present [facility] September 27 - September 29th for fasting lab work . You do not need an appointment.   If you have labs (blood work) drawn today and your tests are completely normal, you will receive your results only by: Marland Kitchen MyChart Message (if you have MyChart) OR . A paper copy in the mail If you have any lab test that is abnormal or we need to change your treatment, we will call you to review the results.   Testing/Procedures: Your EKG today looked great!  Follow-Up: At [facility], you and your health needs are our priority.  As part of our continuing mission to provide you with exceptional heart care, we have created designated Provider Care Teams.  These Care Teams include your primary Cardiologist (physician) and Advanced Practice Providers (APPs -  Physician Assistants and Nurse Practitioners) who all work together to provide you with the care you need, when you need it.   Your next appointment:   4 month(s)  The format for your next appointment:   In Person  Provider:   You may see Kathlyn Sacramento, MD or one of the following Advanced Practice Providers on your designated Care Team:    Murray Hodgkins, NP  Christell Faith, PA-C  Marrianne Mood, PA-C  Laurann Montana, NP  Other Instructions  Recommend using your CPAP if possible as it helps lower your blood pressure and protects your heart function.

## 2020-06-15 ENCOUNTER — Telehealth: Payer: Self-pay | Admitting: Family

## 2020-06-15 DIAGNOSIS — I2510 Atherosclerotic heart disease of native coronary artery without angina pectoris: Secondary | ICD-10-CM

## 2020-06-15 NOTE — Telephone Encounter (Signed)
Spoke with patient and he states that he is unable to afford Vascepa. He reports that it was $385 and that was after insurance. Advised that I would certainly check with provider as to if there is another cheaper option. He verbalized understanding of our conversation and had no further questions at this time.

## 2020-06-15 NOTE — Telephone Encounter (Signed)
Pt c/o medication issue:  1. Name of Medication: vascepa  2. How are you currently taking this medication (dosage and times per day)? Not started   3. Are you having a reaction (difficulty breathing--STAT)?  no  4. What is your medication issue? Too expensive cannot afford please advise

## 2020-06-18 NOTE — Telephone Encounter (Signed)
Can we please have Mr. Toso present to the [place] this week or next at his convenience for fasting lipid panel and ALT/AST? That will let us know how aggressive we need to be in treating his triglycerides and which medication is most effective to switch to. There are options other than Vascepa but want to be sure we choose the right agent.  Best, Loel Dubonnet, NP

## 2020-06-18 NOTE — Telephone Encounter (Signed)
Call to patient to review labs.    Pt verbalized understanding and has no further questions at this time.    Advised pt to call for any further questions or concerns.  Orders placed as advised.   

## 2020-06-19 ENCOUNTER — Other Ambulatory Visit
Admission: RE | Admit: 2020-06-19 | Discharge: 2020-06-19 | Disposition: A | Discharge status: Home / Self Care | Payer: Medicare Other | Attending: Family | Admitting: Family

## 2020-06-19 ENCOUNTER — Telehealth: Payer: Self-pay

## 2020-06-19 ENCOUNTER — Other Ambulatory Visit: Payer: Self-pay

## 2020-06-19 DIAGNOSIS — E785 Hyperlipidemia, unspecified: Secondary | ICD-10-CM

## 2020-06-19 DIAGNOSIS — I2510 Atherosclerotic heart disease of native coronary artery without angina pectoris: Secondary | ICD-10-CM | POA: Diagnosis not present

## 2020-06-19 LAB — HEPATIC FUNCTION PANEL
ALT: 31 U/L (ref 0–44)
AST: 24 U/L (ref 15–41)
Albumin: 4.1 g/dL (ref 3.5–5.0)
Alkaline Phosphatase: 62 U/L (ref 38–126)
Bilirubin, Direct: 0.1 mg/dL (ref 0.0–0.2)
Indirect Bilirubin: 0.9 mg/dL (ref 0.3–0.9)
Total Bilirubin: 1 mg/dL (ref 0.3–1.2)
Total Protein: 7.4 g/dL (ref 6.5–8.1)

## 2020-06-19 LAB — LIPID PANEL
Cholesterol: 163 mg/dL (ref 0–200)
HDL: 41 mg/dL (ref >40)
LDL Cholesterol: 78 mg/dL (ref 0–99)
Total CHOL/HDL Ratio: 4 RATIO
Triglycerides: 220 mg/dL — ABNORMAL HIGH (ref <150)
VLDL: 44 mg/dL — ABNORMAL HIGH (ref 0–40)

## 2020-06-19 MED ORDER — EZETIMIBE 10 MG PO TABS: 10.0000 mg | ORAL_TABLET | Freq: Every day | ORAL | 1 refills | Status: DC

## 2020-06-19 NOTE — Telephone Encounter (Signed)
Patient made aware of lab results and Laurann Montana, NP recommendation. Patient is agreeable with starting Zetia10 mg daily and rqst that the Rx be sent to [facility]. Rx sent has been sent to the patients mail order pharmacy. 8 weeks after he will report to the medical mall for a fasting lipid and lft. Orders are in [place].  Patient verbalized understanding and voiced appreciation for the call.

## 2020-06-19 NOTE — Telephone Encounter (Signed)
-----   Message from Loel Dubonnet, NP sent at 06/19/2020 12:26 PM EDT ----- Normal liver function. Triglycerides remain elevated (they were 220 with goal of <150). Continue to reduce sweets, sugars, carbohydrates, alcohol intake. Total cholesterol and HDL (good cholesterol) numbers are at goal. LDL (bad cholesterol) mildly elevated (it is 78 and we would like it to be less than 70).   Vascepa noted to be too expensive. Start Zetia 10mg  daily for lowering of LDL and triglycerides. Repeat lipid/liver in 8 weeks.

## 2020-06-30 ENCOUNTER — Telehealth: Payer: Self-pay | Admitting: Cardiovascular Disease

## 2020-06-30 NOTE — Telephone Encounter (Signed)
   Primary Cardiologist: Kathlyn Sacramento, MD  Chart reviewed as part of pre-operative protocol coverage.   Simple dental extractions are considered low risk procedures per guidelines and generally do not require any specific cardiac clearance. It is also generally accepted that for simple extractions and dental cleanings, there is no need to interrupt blood thinner therapy.  SBE prophylaxis is not required for the patient.  I will route this recommendation to the requesting party via Epic fax function and remove from pre-op pool.  Please call with questions.  Kerin Ransom, PA-C 06/30/2020, 2:27 PM

## 2020-06-30 NOTE — Telephone Encounter (Signed)
   [facility] Pre-operative Risk Assessment    HEARTCARE STAFF: - Please ensure there is not already an duplicate clearance open for this procedure. - Under Visit Info/Reason for Call, type in Other and utilize the format Clearance MM/DD/YY or Clearance TBD. Do not use dashes or single digits. - If request is for dental extraction, please clarify the # of teeth to be extracted.  Request for surgical clearance:  1. What type of surgery is being performed? Fillings crown and single extraction    2. When is this surgery scheduled? tbd   3. What type of clearance is required (medical clearance vs. Pharmacy clearance to hold med vs. Both)? Medical   4. Are there any medications that need to be held prior to surgery and how long? None   5. Practice name and name of physician performing surgery? LTR Dr. Sharlett Iles   6. What is the office phone number?  [phone]   7.   What is the office fax number?  [phone]  8.   Anesthesia type (None, local, MAC, general) ? LOCAL    Clarisse Gouge 06/30/2020, 1:38 PM  _________________________________________________________________   (provider comments below)

## 2020-10-09 ENCOUNTER — Ambulatory Visit: Payer: Medicare Other | Admitting: Family

## 2020-10-14 ENCOUNTER — Ambulatory Visit: Payer: Medicare Other | Admitting: Podiatry

## 2020-10-20 ENCOUNTER — Other Ambulatory Visit: Payer: Self-pay | Admitting: Cardiovascular Disease

## 2020-10-20 NOTE — Telephone Encounter (Signed)
Patient scheduled for 1/6

## 2020-10-20 NOTE — Telephone Encounter (Signed)
Please schedule overdue F/U appointment-patient cancelled last appointment and did not reschedule. Thank you!

## 2020-10-26 DIAGNOSIS — H2512 Age-related nuclear cataract, left eye: Secondary | ICD-10-CM | POA: Diagnosis not present

## 2020-10-26 NOTE — Progress Notes (Unsigned)
Cardiology Office Note    Date:  10/29/2020   ID:  GAVIN TELFORD, DOB Aug 07, 1951, MRN [id number]  PCP:  Owens Loffler, MD  Cardiologist:  Kathlyn Sacramento, MD  Electrophysiologist:  None   Chief Complaint: Follow up  History of Present Illness:   Carlos Erickson is a 70 y.o. male with history of CAD with NSTEMI in 2015 status post two-vessel CABG in 2015, HTN, HLD, OSA on CPAP, Barrett's esophagus, and GERD who presents for follow-up of his CAD.  He was admitted to the hospital in 04/2014 with an NSTEMI.  He underwent cardiac cath by outside cardiology group which showed two-vessel disease including 80% proximal LAD stenosis, and 75% proximal RCA stenosis.  He was transferred to [facility] and subsequently underwent two-vessel CABG.  EF was normal by echo.  He underwent ETT in 2017 which was normal.  He has been maintained on Plavix monotherapy due to aspirin allergy.  He was seen in 11/2019 and was having difficulty in getting CPAP parts and in this setting he was unable to use his CPAP regularly.  He was evaluated by neurology earlier this year for face and lip paresthesias felt to be in the setting of a viral illness.  MRI of the brain was unrevealing.  He was last seen in the office in 05/2020 with BP readings at home in the 140s to 160s over 80s to 90s.  In this setting amlodipine was titrated to 10 mg daily.  With elevated triglycerides noted he was started on Vascepa and atorvastatin was reduced to 20 mg daily.  Alcohol consumption was recommended to be decreased.  Financial constraints precluded Vascepa and in this setting he was started on Zetia.   He comes in doing well from a cardiac perspective.  He denies any chest pain, dyspnea, palpitations, dizziness, presyncope, syncope, falls, hematochezia, or melena.  He did not tolerate the addition of Zetia as this led to a return of myalgias/arthralgias.  It is noted that he had previously self decreased his atorvastatin to 20 mg daily prior to his  previous appointment in 05/2020 in the setting of myalgias/arthralgias.  He has worked on eating a healthier diet and has lost 6 pounds since his last office visit.  He has also decreased his alcohol consumption where he was previously drinking 3 beers per day and now has had 6 beers in the past several months total, though at least 6 beers have been within the past week.  He prefers no labs or medication changes at this time.  He does not have any issues or concerns at this time.   Labs independently reviewed: 05/2020 - albumin 4.1, AST/ALT normal, TC 163, TG 220, HDL 41, LDL 78 11/2019 - A1c 5.9, Hgb 16.1, PLT 261, BUN 16, serum creatinine 1.01, potassium 4.3  Past Medical History:  Diagnosis Date  . Arthritis   . Barrett's esophagus 07/04/2016   pt states he was told he does not have this.  Marland Kitchen CAD (coronary artery disease), native coronary artery 05/2014   Non-ST elevation myocardial infarction. Echocardiogram showed normal LV systolic function with mild to moderate mitral regurgitation. Cardiac catheterization showed significant two-vessel coronary artery disease including the RCA and left anterior descending artery. He underwent CABG at [facility].  . Colon polyps   . GERD (gastroesophageal reflux disease)   . Hyperlipidemia   . Hypertension   . MI (myocardial infarction) ([place])   . Past heart attack, 05/11/2014 07/09/2014  . Prediabetes   . S/P CABG  x 2 07/09/2014  . Sleep apnea 03/2019   on CPAP    Past Surgical History:  Procedure Laterality Date  . CARDIAC CATHETERIZATION  03/2014   [facility]  . cataract Right 1980   Dr. Katy Fitch  . CORONARY ARTERY BYPASS GRAFT  05/2014   [facility], CABG x 2  . EYE SURGERY    . HEMORROIDECTOMY    . LUNG BIOPSY  2006   [facility]    Current Medications: Current Meds  Medication Sig  . Acetaminophen (TYLENOL PO) Take by mouth as needed (aches and pains).  Marland Kitchen amLODipine (NORVASC) 10 MG tablet Take 1 tablet (10 mg total) by mouth daily.  . Aspirin-Acetaminophen-Caffeine  (EXCEDRIN PO) Take by mouth as needed (aches and pains).  Marland Kitchen atorvastatin (LIPITOR) 20 MG tablet Take 1 tablet (20 mg total) by mouth daily.  . carvedilol (COREG) 6.25 MG tablet TAKE 1 TABLET BY MOUTH  TWICE DAILY  . clopidogrel (PLAVIX) 75 MG tablet TAKE 1 TABLET BY MOUTH ONCE DAILY  . losartan (COZAAR) 100 MG tablet TAKE 1 TABLET BY MOUTH  DAILY  . omeprazole (PRILOSEC) 20 MG capsule Take 20 mg by mouth daily.    Allergies:   Plasma protein fraction, Aspirin, Icosapent ethyl, and Morphine and related   Social History   Socioeconomic History  . Marital status: Married    Spouse name: Not on file  . Number of children: 2  . Years of education: Not on file  . Highest education level: High school graduate  Occupational History  . Occupation: retired  Tobacco Use  . Smoking status: Former Smoker    Years: 1.00    Types: Cigars    Quit date: 2015    Years since quitting: 7.0  . Smokeless tobacco: Never Used  Vaping Use  . Vaping Use: Never used  Substance and Sexual Activity  . Alcohol use: Yes    Alcohol/week: 7.0 - 21.0 standard drinks    Types: 7 - 21 Cans of beer per week    Comment: beers 1-3 daily   . Drug use: No  . Sexual activity: Yes    Partners: Female  Other Topics Concern  . Not on file  Social History Narrative   Lives at home with wife   Right handed   Caffeine: 1-2 cups of coffee per day   Social Determinants of Health   Financial Resource Strain: Not on file  Food Insecurity: Not on file  Transportation Needs: Not on file  Physical Activity: Not on file  Stress: Not on file  Social Connections: Not on file     Family History:  The patient's family history includes Diabetes in his mother; Heart Problems in his mother; Hypertension in his mother; Stroke in his mother.  ROS:   Review of Systems  Constitutional: Negative for chills, diaphoresis, fever, malaise/fatigue and weight loss.  HENT: Negative for congestion.        Dental pain  Eyes:  Negative for discharge and redness.  Respiratory: Negative for cough, sputum production, shortness of breath and wheezing.   Cardiovascular: Negative for chest pain, palpitations, orthopnea, claudication, leg swelling and PND.  Gastrointestinal: Negative for abdominal pain, blood in stool, heartburn, melena, nausea and vomiting.  Musculoskeletal: Negative for falls and myalgias.  Skin: Negative for rash.  Neurological: Negative for dizziness, tingling, tremors, sensory change, speech change, focal weakness, loss of consciousness and weakness.  Endo/Heme/Allergies: Does not bruise/bleed easily.  Psychiatric/Behavioral: Negative for substance abuse. The patient is not nervous/anxious.  All other systems reviewed and are negative.    EKGs/Labs/Other Studies Reviewed:    Studies reviewed were summarized above. The additional studies were reviewed today:  ETT 06/2016:  Blood pressure demonstrated a hypertensive response to exercise.  There was no ST segment deviation noted during stress.  No T wave inversion was noted during stress.   Normal treadmill stress test with no evidence of ischemia. Good exercise capacity with hypertensive response to exercise. No hypoxia with exercise. __________  [facility] 04/2014: Proximal LAD 80% stenosis, proximal RCA 75% stenosis, EF 55%  EKG:  EKG is ordered today.  The EKG ordered today demonstrates NSR, 71 bpm, normal axis, no acute ST-T changes  Recent Labs: 11/28/2019: BUN 16; Creatinine, Ser 1.01; Hemoglobin 16.1; Platelets 261; Potassium 4.3; Sodium 137 06/19/2020: ALT 31  Recent Lipid Panel    Component Value Date/Time   CHOL 163 06/19/2020 0941   CHOL 157 11/28/2019 1349   CHOL 184 05/12/2014 0256   TRIG 220 (H) 06/19/2020 0941   TRIG 567 (H) 05/12/2014 0256   HDL 41 06/19/2020 0941   HDL 42 11/28/2019 1349   HDL 26 (L) 05/12/2014 0256   CHOLHDL 4.0 06/19/2020 0941   VLDL 44 (H) 06/19/2020 0941   VLDL SEE COMMENT 05/12/2014 0256   LDLCALC  78 06/19/2020 0941   LDLCALC 78 11/28/2019 1349   LDLCALC SEE COMMENT 05/12/2014 0256   LDLDIRECT 77.0 07/05/2018 0802    PHYSICAL EXAM:    VS:  BP 120/84 (BP Location: Left Arm, Patient Position: Sitting, Cuff Size: Normal)   Pulse 71   Ht 5\' 8"  (1.727 m)   Wt 191 lb 2 oz (86.7 kg)   SpO2 98%   BMI 29.06 kg/m   BMI: Body mass index is 29.06 kg/m.  Physical Exam Vitals reviewed.  Constitutional:      Appearance: He is well-developed and well-nourished.  HENT:     Head: Normocephalic and atraumatic.  Eyes:     General:        Right eye: No discharge.        Left eye: No discharge.  Neck:     Vascular: No JVD.  Cardiovascular:     Rate and Rhythm: Normal rate and regular rhythm.     Pulses: No midsystolic click and no opening snap.          Posterior tibial pulses are 2+ on the right side and 2+ on the left side.     Heart sounds: Normal heart sounds, S1 normal and S2 normal. Heart sounds not distant. No murmur heard. No friction rub.  Pulmonary:     Effort: Pulmonary effort is normal. No respiratory distress.     Breath sounds: Normal breath sounds. No decreased breath sounds, wheezing or rales.  Chest:     Chest wall: No tenderness.  Abdominal:     General: There is no distension.     Palpations: Abdomen is soft.     Tenderness: There is no abdominal tenderness.  Musculoskeletal:        General: No edema.     Cervical back: Normal range of motion.  Skin:    General: Skin is warm and dry.     Nails: There is no clubbing or cyanosis.  Neurological:     Mental Status: He is alert and oriented to person, place, and time.  Psychiatric:        Mood and Affect: Mood and affect normal.        Speech: Speech  normal.        Behavior: Behavior normal.        Thought Content: Thought content normal.        Judgment: Judgment normal.     Wt Readings from Last 3 Encounters:  10/29/20 191 lb 2 oz (86.7 kg)  06/11/20 197 lb (89.4 kg)  03/12/20 192 lb (87.1 kg)      ASSESSMENT & PLAN:   1. CAD status post two-vessel CABG without angina: He is doing well without any symptoms concerning for angina.  Continue secondary prevention including Plavix monotherapy in the setting of aspirin allergy along with atorvastatin, carvedilol, and losartan.  No indication for further ischemic testing at this time.  2. HTN: Blood pressure is well controlled in the office today at 120/84.  Continue amlodipine, carvedilol, and losartan.  Low-sodium diet recommended.  Minimizing alcohol recommended.  3. HLD/hypertriglyceridemia: LDL 78 and triglyceride 220 from 05/2020 with normal LFT at that time.  Financial constraints precluded addition of Vascepa.  Historically, he has been intolerant to higher dose atorvastatin secondary to arthralgias and myalgias and self tapered his dose to 20 mg daily which he tolerates without issues.  He was unable to tolerate ezetimibe secondary to return of myalgias/arthralgias.  With self discontinuation of this medication these have resolved.  At last check, LDL and triglycerides remain above goal.  He declines follow-up lipid panel at this time.  We will recheck a fasting lipid and liver function in 05/2021.  If his LDL remains above goal at that time consider transition of atorvastatin to rosuvastatin.  Alternatively, we could consider referral to lipid clinic for consideration of PCSK9 inhibitor.  Hopefully, with weight loss, healthier diet, and decreased alcohol consumption, his triglycerides will improve on recheck.  4. OSA: CPAP recommended.  This was not discussed in detail at today's visit.  Disposition: F/u with Dr. Fletcher Anon or an APP in 7 months.   Medication Adjustments/Labs and Tests Ordered: Current medicines are reviewed at length with the patient today.  Concerns regarding medicines are outlined above. Medication changes, Labs and Tests ordered today are summarized above and listed in the Patient Instructions accessible in Encounters.    Signed, Christell Faith, PA-C 10/29/2020 10:45 AM     [facility] [address], [place] [address] [phone]

## 2020-10-29 ENCOUNTER — Ambulatory Visit: Payer: Medicare Other | Admitting: Physician Assistant

## 2020-10-29 ENCOUNTER — Other Ambulatory Visit: Payer: Self-pay

## 2020-10-29 ENCOUNTER — Encounter: Payer: Self-pay | Admitting: Physician Assistant

## 2020-10-29 VITALS — BP 120/84 | HR 71 | Ht 68.0 in | Wt 191.1 lb

## 2020-10-29 DIAGNOSIS — E785 Hyperlipidemia, unspecified: Secondary | ICD-10-CM

## 2020-10-29 DIAGNOSIS — G4733 Obstructive sleep apnea (adult) (pediatric): Secondary | ICD-10-CM | POA: Diagnosis not present

## 2020-10-29 DIAGNOSIS — I2510 Atherosclerotic heart disease of native coronary artery without angina pectoris: Secondary | ICD-10-CM | POA: Diagnosis not present

## 2020-10-29 DIAGNOSIS — E781 Pure hyperglyceridemia: Secondary | ICD-10-CM | POA: Diagnosis not present

## 2020-10-29 DIAGNOSIS — Z951 Presence of aortocoronary bypass graft: Secondary | ICD-10-CM | POA: Diagnosis not present

## 2020-10-29 NOTE — Patient Instructions (Addendum)
Medication Instructions:  No changes  *If you need a refill on your cardiac medications before your next appointment, please call your pharmacy*   Lab Work: Lipid & Liver panel in 7 months. Roughly May 28, 2021. No appointment needed just go to the [facility] entrance and check in at registration to have those done. Make sure not to eat or drink anything after midnight before except sip of water with your medications. Lab slips provided for you to show them what you need. Please make sure these are done prior to your follow up appointment so provider can review results with you.    If you have labs (blood work) drawn today and your tests are completely normal, you will receive your results only by: Marland Kitchen MyChart Message (if you have MyChart) OR . A paper copy in the mail If you have any lab test that is abnormal or we need to change your treatment, we will call you to review the results.   Testing/Procedures: None   Follow-Up: At [facility], you and your health needs are our priority.  As part of our continuing mission to provide you with exceptional heart care, we have created designated Provider Care Teams.  These Care Teams include your primary Cardiologist (physician) and Advanced Practice Providers (APPs -  Physician Assistants and Nurse Practitioners) who all work together to provide you with the care you need, when you need it.   Your next appointment:   7 month(s) after your labs have been done.  The format for your next appointment:   In Person  Provider:   You may see Kathlyn Sacramento, MD or one of the following Advanced Practice Providers on your designated Care Team:    Murray Hodgkins, NP  Christell Faith, PA-C  Marrianne Mood, PA-C  Cadence Roxboro, [place]  Laurann Montana, NP

## 2021-01-03 ENCOUNTER — Other Ambulatory Visit: Payer: Self-pay | Admitting: Cardiovascular Disease

## 2021-03-09 ENCOUNTER — Other Ambulatory Visit: Payer: Self-pay | Admitting: Cardiovascular Disease

## 2021-05-08 ENCOUNTER — Other Ambulatory Visit: Payer: Self-pay | Admitting: Family

## 2021-05-09 NOTE — Telephone Encounter (Signed)
Please assist to schedule recall for August 2022. Thank you!

## 2021-05-10 NOTE — Telephone Encounter (Signed)
Scheduled

## 2021-05-23 ENCOUNTER — Other Ambulatory Visit: Payer: Self-pay | Admitting: Cardiovascular Disease

## 2021-05-27 ENCOUNTER — Telehealth: Payer: Self-pay | Admitting: *Deleted

## 2021-05-27 ENCOUNTER — Other Ambulatory Visit
Admission: RE | Admit: 2021-05-27 | Discharge: 2021-05-27 | Disposition: A | Discharge status: Home / Self Care | Payer: Medicare Other | Attending: Physician Assistant | Admitting: Physician Assistant

## 2021-05-27 DIAGNOSIS — E785 Hyperlipidemia, unspecified: Secondary | ICD-10-CM | POA: Insufficient documentation

## 2021-05-27 DIAGNOSIS — I2510 Atherosclerotic heart disease of native coronary artery without angina pectoris: Secondary | ICD-10-CM | POA: Diagnosis not present

## 2021-05-27 LAB — HEPATIC FUNCTION PANEL
ALT: 28 U/L (ref 0–44)
AST: 26 U/L (ref 15–41)
Albumin: 4.2 g/dL (ref 3.5–5.0)
Alkaline Phosphatase: 69 U/L (ref 38–126)
Bilirubin, Direct: 0.1 mg/dL (ref 0.0–0.2)
Indirect Bilirubin: 1 mg/dL — ABNORMAL HIGH (ref 0.3–0.9)
Total Bilirubin: 1.1 mg/dL (ref 0.3–1.2)
Total Protein: 7.3 g/dL (ref 6.5–8.1)

## 2021-05-27 LAB — LIPID PANEL
Cholesterol: 158 mg/dL (ref 0–200)
HDL: 43 mg/dL (ref >40)
LDL Cholesterol: 74 mg/dL (ref 0–99)
Total CHOL/HDL Ratio: 3.7 RATIO
Triglycerides: 204 mg/dL — ABNORMAL HIGH (ref <150)
VLDL: 41 mg/dL — ABNORMAL HIGH (ref 0–40)

## 2021-05-27 NOTE — Telephone Encounter (Signed)
Spoke with patient and reviewed results and recommendations. Reviewed options from provider and he wants to just continue his current medication. Discussed Lipid Clinic and he again just wants to continue what he is doing now. Advised I would update provider on his plan and confirmed his next appointment. He was appreciative for the call with no further questions at this time.

## 2021-05-27 NOTE — Telephone Encounter (Signed)
Left voicemail message to call back for review of results and recommendations.  

## 2021-05-27 NOTE — Telephone Encounter (Signed)
-----   Message from Rise Mu, PA-C sent at 05/27/2021 12:28 PM EDT ----- Triglycerides remain elevated at 204 LDL is slightly improved at 74 with goal being less than 70 Liver function essentially normal  Recommendations: -Options include discontinuing atorvastatin and initiating rosuvastatin to see if he tolerates this better at 20 mg daily initially with plans to titrate to 40 mg if he tolerates the medication -Or, could also refer him to the lipid clinic if he would like

## 2021-05-27 NOTE — Telephone Encounter (Signed)
Noted  

## 2021-05-31 ENCOUNTER — Telehealth: Payer: Self-pay | Admitting: *Deleted

## 2021-05-31 DIAGNOSIS — U071 COVID-19: Secondary | ICD-10-CM

## 2021-05-31 MED ORDER — MOLNUPIRAVIR EUA 200MG CAPSULE: 4.0000 | ORAL_CAPSULE | Freq: Two times a day (BID) | ORAL | 0 refills | Status: AC

## 2021-05-31 NOTE — Telephone Encounter (Signed)
I sent in anti-viral for hiim

## 2021-05-31 NOTE — Telephone Encounter (Signed)
Spoke to patient by telephone and was advised that he started with symptoms Friday and tested positive Sunday 05/30/21 for covid. Patient stated that he has a cough, head congestion and slight headache. Patient denies a fever and no more SOB than he usually has. Patient stated that he has no idea where he got covid from but goes everywhere without a mask. Patient stated that his blood pressure is 140/81 and oxygen level is 94%. Patient stated that he does not feel bad enough where he feels that he wants any antiviral medication. Patient declined a virtual visit. Patient was advised to rest, drink lots of fluids and eat well-balanced meals. Patient was given ER precautions and he verbalized understanding.

## 2021-05-31 NOTE — Telephone Encounter (Signed)
Left message for Carlos Erickson that Dr. Lorelei Pont has sent him in a Rx for the antivirals.  If he does not feel he needs the antivirals then he does not have to pick up the Rx from the pharmacy.  I did remind him that the antivirals did need to be started within 5 days of onset of symptoms.

## 2021-05-31 NOTE — Telephone Encounter (Signed)
PLEASE NOTE: All timestamps contained within this report are represented as [place] Standard Time. CONFIDENTIALTY NOTICE: This fax transmission is intended only for the addressee. It contains information that is legally privileged, confidential or otherwise protected from use or disclosure. If you are not the intended recipient, you are strictly prohibited from reviewing, disclosing, copying using or disseminating any of this information or taking any action in reliance on or regarding this information. If you have received this fax in error, please notify us immediately by telephone so that we can arrange for its return to [place]. Phone: [phone], Toll-Free: [phone], Fax: [phone] Page: 1 of 2 Call Id: [id number] [place]le RECORD Access[facility] Patient Name: Carlos Erickson Gender: Male DOB: Jun 03, 1951 Age: 70 Y 82 M 3 D Return Phone Number: [phone] (Primary), [phone] (Secondary) Address: City/ State/ [place] [address] Client [facility] Night - Client Client Site [place] Physician Copland, Frederico Hamman - MD Contact Type Call Who Is Calling Patient / Member / Family / Caregiver Call Type Triage / Clinical Relationship To Patient Self Return Phone Number [phone] (Primary) Chief Complaint Infection Exposure (non-symptomatic) Reason for Call Symptomatic / Request for [place] states he is covid+ Translation No Nurse Assessment Nurse: Loletha Carrow, RN, Ronalee Belts Date/Time (Eastern Time): 05/30/2021 9:27:20 AM Confirm and document reason for call. If symptomatic, describe symptoms. ---Caller states he is covid+ Sx X 3 days sinus and chest congestion, no fever but has chills, cramps, mild headache, Denies any other symtoms Does the patient have any new or worsening symptoms? ---Yes Will a triage be completed? ---Yes Related  visit to physician within the last 2 weeks? ---No Does the PT have any chronic conditions? (i.e. diabetes, asthma, this includes High risk factors for pregnancy, etc.) ---No Is this a behavioral health or substance abuse call? ---No Guidelines Guideline Title Affirmed Question Affirmed Notes Nurse Date/Time (Eastern Time) COVID-19 - Diagnosed or Suspected [1] HIGH RISK for severe COVID complications (e.g., weak immune system, age > 65 years, obesity with BMI > 25, pregnant, chronic lung disease or other chronic medical condition) AND [2] COVID symptoms (e.g., cough, fever) (Exceptions: Already Emch, RNRonalee Belts 05/30/2021 9:29:15 AM PLEASE NOTE: All timestamps contained within this report are represented as [place] Standard Time. CONFIDENTIALTY NOTICE: This fax transmission is intended only for the addressee. It contains information that is legally privileged, confidential or otherwise protected from use or disclosure. If you are not the intended recipient, you are strictly prohibited from reviewing, disclosing, copying using or disseminating any of this information or taking any action in reliance on or regarding this information. If you have received this fax in error, please notify us immediately by telephone so that we can arrange for its return to [place]. Phone: [phone], Toll-Free: [phone], Fax: [phone] Page: 2 of 2 Call Id: [id number] Guidelines Guideline Title Affirmed Question Affirmed Notes Nurse Date/Time Eilene Ghazi Time) seen by PCP and no new or worsening symptoms.) Disp. Time Eilene Ghazi Time) Disposition Final User 05/30/2021 9:35:44 AM Called On-Call Provider Emch, RN, Ronalee Belts 05/30/2021 9:33:58 AM Call PCP Now Yes Emch, RN, Vicenta Dunning Disagree/Comply Comply Caller Understands Yes PreDisposition Did not know what to do Care Advice Given Per Guideline CALL PCP NOW: * You need to discuss this with your doctor (or NP/PA). * I'll page the on-call provider now. If  you haven't heard from the provider (or me) within 30 minutes, call again. *  For fevers above 101 F (38.3 C) take either acetaminophen or ibuprofen. FEVER MEDICINES: FEVER MEDICINES - EXTRA NOTES AND WARNINGS: COVID-19 - HOW TO PROTECT OTHERS - WHEN YOU ARE SICK WITH COVID-19: * STAY HOME A MINIMUM OF 5 DAYS: Home isolation is needed for at least 5 days after the symptoms started. Stay home from school or work if you are sick. Do NOT go to religious services, child care centers, shopping, or other public places. Do NOT use public transportation (e.g., bus, taxis, ride-sharing). Do NOT allow any visitors to your home. Leave the house only if you need to seek urgent medical care. * WEAR A MASK FOR 10 DAYS: Wear a well-fitted mask for 10 full days any time you are around others inside your home or in public. Do not go to places where you are unable to wear a mask. CALL BACK IF: * You become worse CARE ADVICE given per COVID-19 - DIAGNOSED OR SUSPECTED (Adult) guideline. Comments User: Doretha Sou, RN Date/Time Eilene Ghazi Time): 05/30/2021 9:31:36 AM Pulse Ox: 94% Paging DoctorName Phone DateTime Result/ Outcome Message Type Notes Dimas Chyle- MD [phone] 05/30/2021 9:35:44 AM Called On Call Provider - Reached Doctor Paged Dimas Chyle- MD 05/30/2021 9:37:44 AM Spoke with On Call - Outcome Notification Message Result Please advise caller to manages symptoms and to call his PCP tomorrow morning for any additional treatments thay may be advised

## 2021-06-02 ENCOUNTER — Other Ambulatory Visit: Payer: Self-pay | Admitting: Cardiovascular Disease

## 2021-06-08 ENCOUNTER — Other Ambulatory Visit: Payer: Self-pay | Admitting: Family

## 2021-06-25 ENCOUNTER — Ambulatory Visit: Payer: Medicare Other | Admitting: Cardiovascular Disease

## 2021-07-03 ENCOUNTER — Other Ambulatory Visit: Payer: Self-pay | Admitting: Family

## 2021-07-05 NOTE — Telephone Encounter (Signed)
Pt should keep next appt in October.

## 2021-07-12 ENCOUNTER — Telehealth: Payer: Self-pay

## 2021-07-12 ENCOUNTER — Other Ambulatory Visit: Payer: Self-pay

## 2021-07-12 ENCOUNTER — Ambulatory Visit
Admission: RE | Admit: 2021-07-12 | Discharge: 2021-07-12 | Disposition: A | Discharge status: Home / Self Care | Payer: Medicare Other | Source: Ambulatory Visit | Attending: Emergency Medicine | Admitting: Emergency Medicine

## 2021-07-12 ENCOUNTER — Ambulatory Visit (INDEPENDENT_AMBULATORY_CARE_PROVIDER_SITE_OTHER): Payer: Medicare Other

## 2021-07-12 VITALS — BP 159/94 | HR 77 | Temp 98.2°F | Resp 18 | Ht 68.0 in | Wt 191.1 lb

## 2021-07-12 DIAGNOSIS — R0602 Shortness of breath: Secondary | ICD-10-CM | POA: Diagnosis not present

## 2021-07-12 DIAGNOSIS — R059 Cough, unspecified: Secondary | ICD-10-CM

## 2021-07-12 DIAGNOSIS — K219 Gastro-esophageal reflux disease without esophagitis: Secondary | ICD-10-CM

## 2021-07-12 IMAGING — CR DG CHEST 2V
2 series · 2 of 2 positions shown · non-contrast
Comparison: [DATE]

CLINICAL DATA: Shortness of breath and nasal congestion.

EXAM:
CHEST - 2 VIEW

[chest pa]
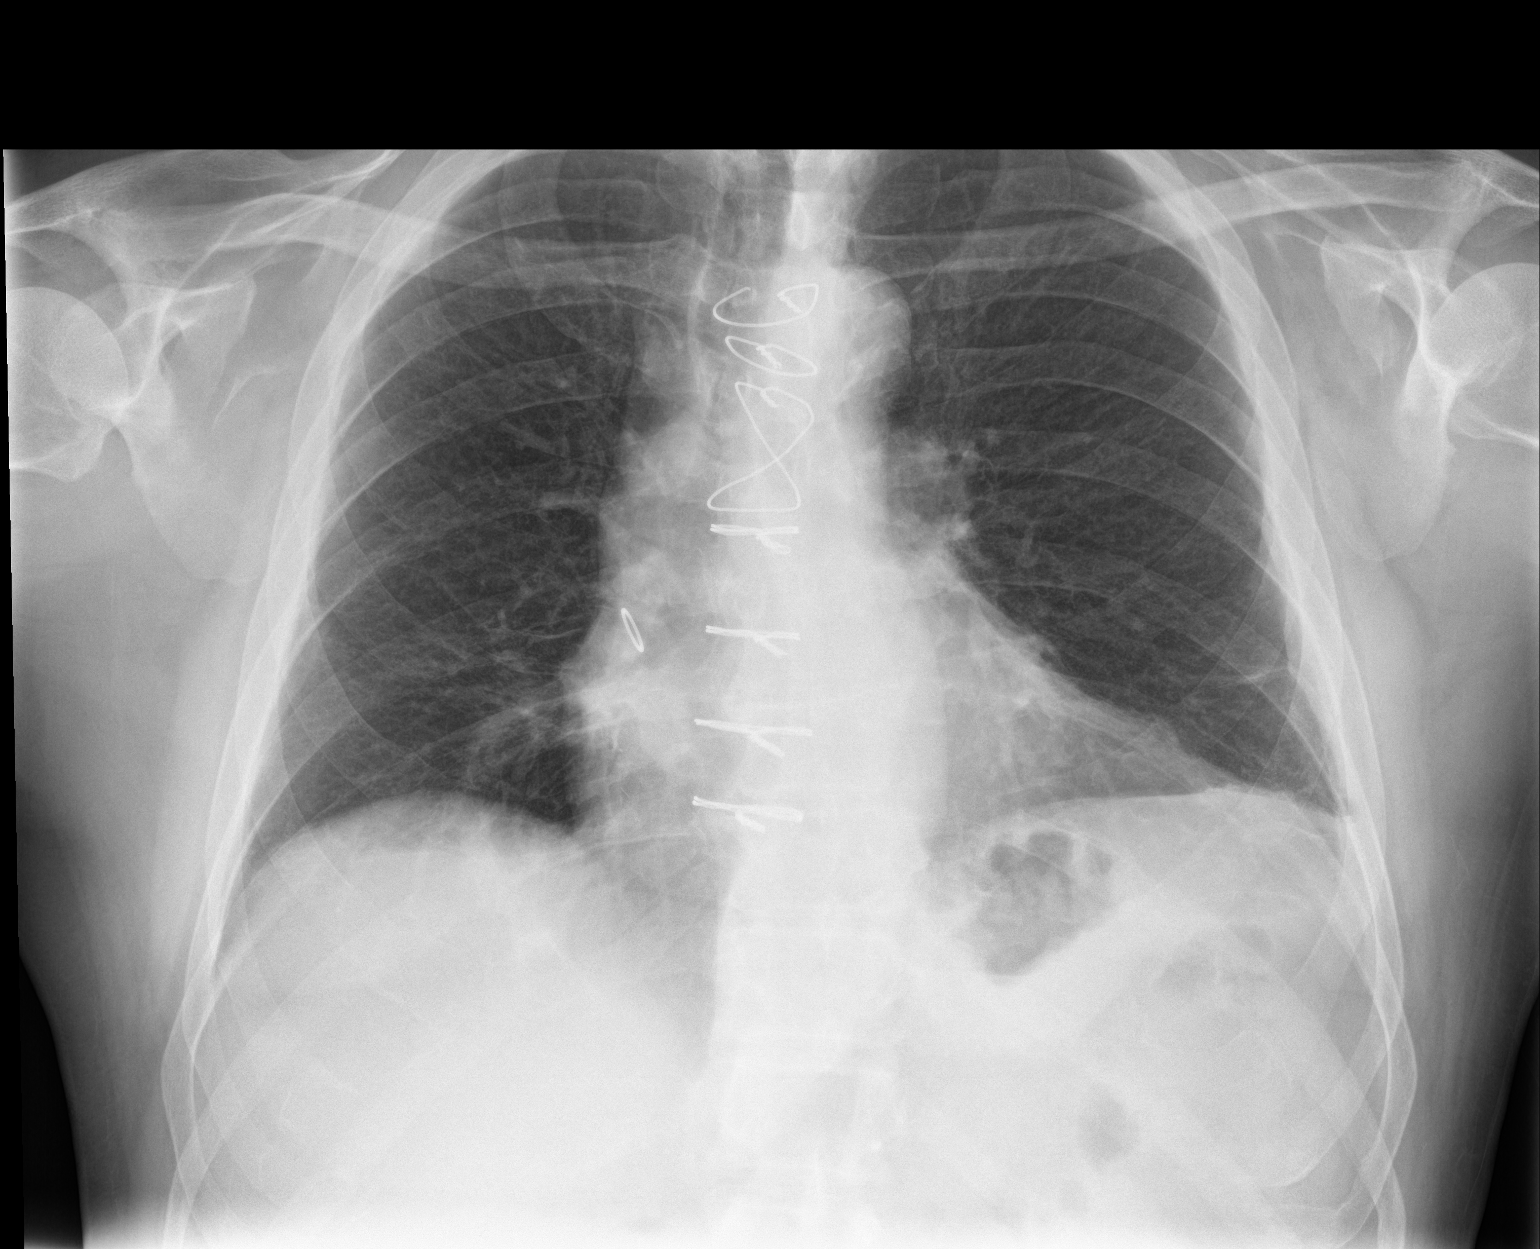

[chest lat]
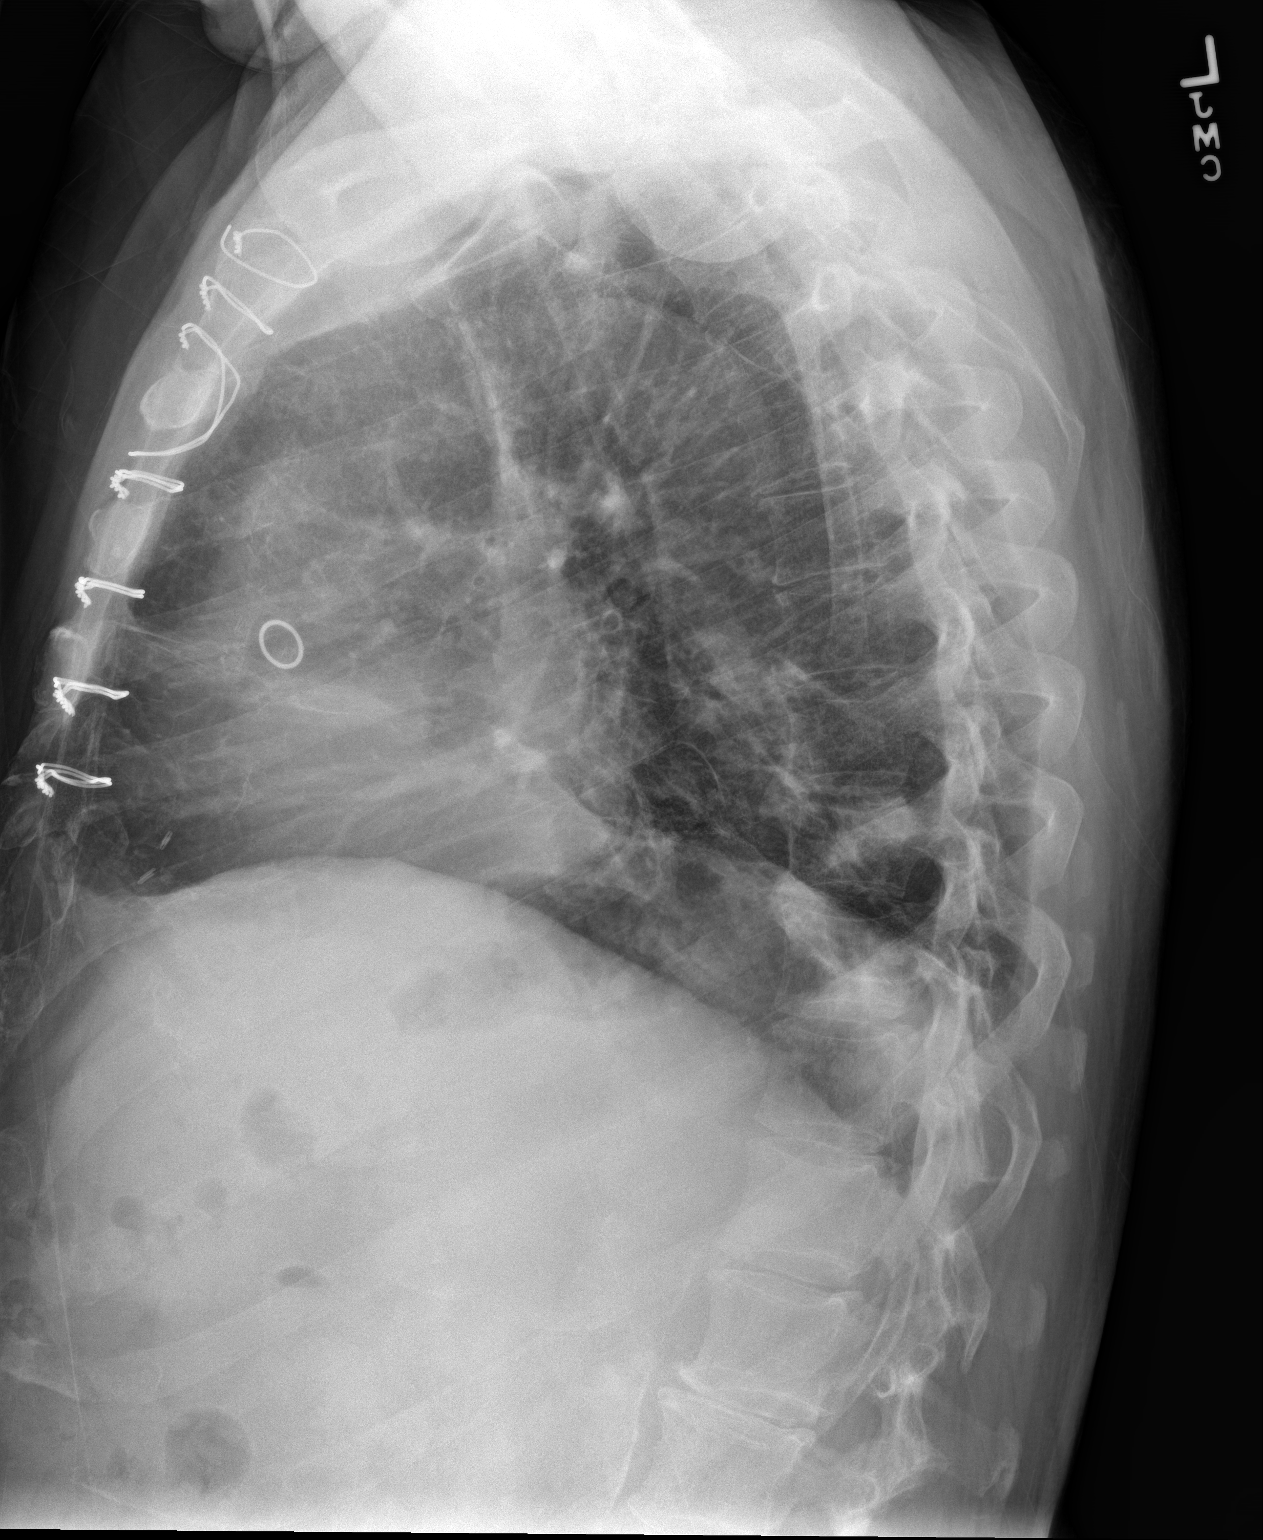

[2 of 2 positions shown; findings below may reference images not displayed]

FINDINGS: Multiple sternal wires and vascular clips are seen. A trace amount
of stable left basilar linear scarring is seen. There is no evidence
of acute infiltrate, pleural effusion or pneumothorax. The heart
size and mediastinal contours are within normal limits. The
visualized skeletal structures are unremarkable.
IMPRESSION: 1. Evidence of prior median sternotomy/CABG.
2. No acute or active cardiopulmonary disease.

## 2021-07-12 MED ORDER — OMEPRAZOLE 20 MG PO CPDR: 20.0000 mg | DELAYED_RELEASE_CAPSULE | Freq: Two times a day (BID) | ORAL | 0 refills | Status: DC

## 2021-07-12 NOTE — Telephone Encounter (Signed)
[place] Day - Client TELEPHONE ADVICE RECORD [facility] Patient Name: Carlos Erickson Gender: Male DOB: [birth year] Age: 70 Y 53 M 12 D Return Phone Number: [phone] (Primary), [phone] (Secondary) Address: City/ State/ Zip: [place] [address] Client [place] Day - Client Client Site [place] - Day Physician Copland, Frederico Hamman - MD Contact Type Call Who Is Calling Patient / Member / Family / Caregiver Call Type Triage / Clinical Caller Name Hadyn Blanck Relationship To Patient Spouse Return Phone Number [phone] (Primary) Chief Complaint BREATHING - shortness of breath or sounds breathless Reason for Call Symptomatic / Request for [place] states patient is having shortness of breath. When he takes in a deep breath he coughs. This has been going on for a while. He coughs up some blood. He had covid 6 weeks ago. His o2 is between 93-95 Translation No Nurse Assessment Nurse: Lenon Curt, RN, Melanie Date/Time (Eastern Time): 07/09/2021 4:55:13 PM Confirm and document reason for call. If symptomatic, describe symptoms. ---Caller states patient is having shortness of breath. When he takes in a deep breath he coughs. This has been going on for a while. He coughs up some blood. He had Covid 6 weeks ago. His o2 is between 93-95% which is abnormal. Does the patient have any new or worsening symptoms? ---Yes Will a triage be completed? ---Yes Related visit to physician within the last 2 weeks? ---No Does the PT have any chronic conditions? (i.e. diabetes, asthma, this includes High risk factors for pregnancy, etc.) ---Yes List chronic conditions. ---MI 7 years ago, double bypass, reflux, HTN Is this a behavioral health or substance abuse call? ---No Guidelines Guideline Title Affirmed Question Affirmed Notes Nurse Date/Time (Eastern Time) Breathing  Difficulty [1] MODERATE difficulty breathing (e.g., speaks in phrases, SOB even at [place], [place], Threasa Beards 07/09/2021 4:57:00 PM PLEASE NOTE: All timestamps contained within this report are represented as [place] Standard Time. CONFIDENTIALTY NOTICE: This fax transmission is intended only for the addressee. It contains information that is legally privileged, confidential or otherwise protected from use or disclosure. If you are not the intended recipient, you are strictly prohibited from reviewing, disclosing, copying using or disseminating any of this information or taking any action in reliance on or regarding this information. If you have received this fax in error, please notify us immediately by telephone so that we can arrange for its return to [place]. Phone: [phone], Toll-Free: [phone], Fax: [phone] Page: 2 of 2 Call Id: [id number] Guidelines Guideline Title Affirmed Question Affirmed Notes Nurse Date/Time Eilene Ghazi Time) rest, pulse 100-120) AND [2] NEW-onset or WORSE than normal Disp. Time Eilene Ghazi Time) Disposition Final User 07/09/2021 4:53:59 PM Send to Urgent Coy Saunas 07/09/2021 5:02:27 PM Go to ED Now Yes Lenon Curt, RN, Donnajean Lopes Disagree/Comply Disagree Caller Understands Yes PreDisposition Call Doctor Care Advice Given Per Guideline GO TO ED NOW: * You need to be seen in the Emergency Department. * Go to the ED at ___________ [place] now. Drive carefully. * Another adult should drive. * Patient should not delay going to the emergency department. * If immediate transportation is not available via car, rideshare (e.g., Lyft, Uber), or taxi, then the patient should be instructed to call [id number]. BRING MEDICINES: CARE ADVICE given per Breathing Difficulty (Adult) guideline. Comments User: Erik Obey, RN Date/Time Eilene Ghazi Time): 07/09/2021 4:56:15 PM 93-94%, 81bpms Referrals GO TO FACILITY REFUSED

## 2021-07-12 NOTE — Discharge Instructions (Addendum)
There is no evidence of active disease process on your chest x-ray to indicate why you are having continued cough and shortness of breath.  No your nasal congestion.  There is no evidence of nasal congestion on your physical exam.  The fact that your symptoms of cough increase when you lay down and also after eating I am suspicious that this may be related to your reflux disease.  Increase your omeprazole dose to 20 mg twice daily to help control reflux and also add on Pepcid 20 mg twice daily for management of your reflux symptoms.  Schedule appointment with your gastroenterologist for a reevaluation.

## 2021-07-12 NOTE — ED Provider Notes (Signed)
[facility]    CSN: [id number] Arrival date & time: 07/12/21  1344      History   Chief Complaint Chief Complaint  Patient presents with   Cough   Appointment    HPI Carlos Erickson is a 70 y.o. male.   HPI  70 year old male here for evaluation of respiratory complaints.  Patient reports that he has been experiencing nasal congestion, chest congestion, cough, and shortness of breath for the past 2 months that worsened after he had COVID 6 weeks ago.  He denies fever, runny nose, sore throat, or sour taste in his mouth.  He states that he will occasionally have some wheezing, his cough is nonproductive and is worse at night.  He also states that 10 minutes after he eats he develops a cough.  Past Medical History:  Diagnosis Date   Arthritis    Barrett's esophagus 07/04/2016   pt states he was told he does not have this.   CAD (coronary artery disease), native coronary artery 05/2014   Non-ST elevation myocardial infarction. Echocardiogram showed normal LV systolic function with mild to moderate mitral regurgitation. Cardiac catheterization showed significant two-vessel coronary artery disease including the RCA and left anterior descending artery. He underwent [facility]G at [facility].   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 Katy Fitch   CORONARY ARTERY BYPASS GRAFT  05/2014   [facility], CABG x 2   EYE SURGERY     HEMORROIDECTOMY     LUNG BIOPSY  2006   [facility]       Home Medications    Prior to Admission medications   Medication Sig Start Date End Date Taking? Authorizing Provider  Acetaminophen (TYLENOL PO) Take by mouth as needed (aches and pains).   Yes [provider]  amLODipine (NORVASC) 10 MG tablet TAKE 1 TABLET BY MOUTH  DAILY 06/09/21  Yes Loel Dubonnet, NP  Aspirin-Acetaminophen-Caffeine (EXCEDRIN PO) Take by mouth as needed (aches and pains).   Yes [provider]  atorvastatin (LIPITOR) 20 MG tablet TAKE 1 TABLET BY MOUTH  DAILY 07/05/21  Yes Loel Dubonnet, NP  carvedilol (COREG) 6.25 MG tablet TAKE 1 TABLET BY MOUTH  TWICE DAILY 06/03/21  Yes Wellington Hampshire, MD  clopidogrel (PLAVIX) 75 MG tablet TAKE 1 TABLET BY MOUTH ONCE DAILY 05/24/21  Yes Wellington Hampshire, MD  losartan (COZAAR) 100 MG tablet TAKE 1 TABLET BY MOUTH  DAILY 06/03/21  Yes Wellington Hampshire, MD  omeprazole (PRILOSEC) 20 MG capsule Take 1 capsule (20 mg total) by mouth 2 (two) times daily  before a meal. 07/12/21   Margarette Canada, NP    Family History Family History  Problem Relation Age of Onset   Stroke Mother    Hypertension Mother    Heart Problems Mother    Diabetes Mother     Social History Social History   Tobacco Use   Smoking status: Former    Types: Cigars    Quit date: 2015    Years since quitting: 7.7   Smokeless tobacco: Never  Vaping Use   Vaping Use: Never used  Substance Use Topics   Alcohol use: Yes    Alcohol/week: 7.0 - 21.0 standard drinks    Types: 7 - 21 Cans of beer per week    Comment: beers 1-3 daily    Drug use: No     Allergies   Plasma protein fraction, Aspirin, Icosapent ethyl, and Morphine and  related   Review of Systems Review of Systems  Constitutional:  Negative for activity change, appetite change and fever.  HENT:  Positive for congestion. Negative for ear pain, rhinorrhea and sore throat.   Respiratory:  Positive for cough, shortness of breath and wheezing.   Gastrointestinal:  Negative for abdominal pain, diarrhea and nausea.  Hematological: Negative.   Psychiatric/Behavioral: Negative.      Physical Exam Triage Vital Signs ED Triage Vitals  Enc Vitals Group     BP 07/12/21 1420 (!) 159/94     Pulse Rate 07/12/21 1420 77     Resp 07/12/21 1420 18     Temp 07/12/21 1420 98.2 F (36.8 C)     Temp Source 07/12/21 1420 Oral     SpO2 07/12/21 1420 98 %     Weight 07/12/21 1421 191 lb 2.2 oz (86.7 kg)     Height 07/12/21 1421 5\' 8"  (1.727 m)     Head Circumference --      Peak Flow --      Pain Score 07/12/21 1421 0     Pain Loc --      Pain Edu? --      Excl. in [place]? --    No data found.  Updated Vital Signs BP (!) 159/94 (BP Location: Left Arm)   Pulse 77   Temp 98.2 F (36.8 C) (Oral)   Resp 18   Ht 5\' 8"  (1.727 m)   Wt 191 lb 2.2 oz (86.7 kg)   SpO2 98%   BMI 29.06 kg/m   Visual Acuity Right Eye Distance:   Left Eye Distance:   Bilateral Distance:    Right Eye Near:   Left Eye Near:    Bilateral Near:     Physical Exam Vitals and nursing note reviewed.  Constitutional:      General: He is not in acute distress.    Appearance: Normal appearance. He is not ill-appearing.  HENT:     Head: Normocephalic and atraumatic.     Right Ear: Tympanic membrane, ear canal and external ear normal.     Left Ear: Tympanic membrane, ear canal and external ear normal.     Nose: Nose normal. No congestion or rhinorrhea.     Mouth/Throat:     Mouth: Mucous membranes are moist.     Pharynx: Oropharynx is clear. No posterior oropharyngeal erythema.  Cardiovascular:     Rate and Rhythm: Normal rate and regular rhythm.     Pulses: Normal pulses.     Heart  sounds: Normal heart sounds. No murmur heard.   No gallop.  Pulmonary:  Effort: Pulmonary effort is normal.     Breath sounds: Normal breath sounds. No wheezing, rhonchi or rales.  Musculoskeletal:     Cervical back: Normal range of motion and neck supple.  Lymphadenopathy:     Cervical: No cervical adenopathy.  Skin:    General: Skin is warm and dry.     Capillary Refill: Capillary refill takes less than 2 seconds.     Findings: No erythema or rash.  Neurological:     General: No focal deficit present.     Mental Status: He is alert and oriented to person, place, and time.  Psychiatric:        Mood and Affect: Mood normal.        Behavior: Behavior normal.        Thought Content: Thought content normal.        Judgment: Judgment normal.     UC Treatments / Results  Labs (all labs ordered are listed, but only abnormal results are displayed) Labs Reviewed - No data to display  EKG   Radiology DG Chest 2 View  Result Date: 07/12/2021 CLINICAL DATA:  Shortness of breath and nasal congestion. EXAM: CHEST - 2 VIEW COMPARISON:  May 11, 2014 FINDINGS: Multiple sternal wires and vascular clips are seen. A trace amount of stable left basilar linear scarring is seen. There is no evidence of acute infiltrate, pleural effusion or pneumothorax. The heart size and mediastinal contours are within normal limits. The visualized skeletal structures are unremarkable. IMPRESSION: 1. Evidence of prior median sternotomy/CABG. 2. No acute or active cardiopulmonary disease. Electronically Signed   By: Virgina Norfolk M.D.   On: 07/12/2021 15:35    Procedures Procedures (including critical care time)  Medications Ordered in UC Medications - No data to display  Initial Impression / Assessment and Plan / UC Course  I have reviewed the triage vital signs and the nursing notes.  Pertinent labs & imaging results that were available during my care of the patient were reviewed by me and  considered in my medical decision making (see chart for details).  Patient is a nontoxic-appearing 69 year old male here for evaluation of nasal congestion, chest congestion, intermittent wheezing, shortness of breath, and a nonproductive cough that been going on for 2 months and worse in the last 6 weeks since having COVID.  Patient states that his cough is worse at night and also he will develop coughing 10 minutes after he eats.  His wheezing has been very occasional.  Patient has a history of Barrett's esophagus as well as reflux disease but has not seen gastroenterology since 2017.  He denies a sour taste in his mouth or sore throat in the mornings.  He does have a history of obstructive sleep apnea and uses a BiPAP as well.  Patient's physical exam reveals mildly ceruminous external auditory canals with pearly gray tympanic membranes bilaterally.  His mucosa is pink and moist without erythema, edema, or discharge.  Oropharyngeal exam reveals pink and moist oral pharyngeal mucosa without injection, erythema, or exudate.  No cervical lymphadenopathy appreciated exam.  Cardiopulmonary exam reveals clear lung sounds in all fields.  Suspect patient's symptoms are coming from reflux disease but will obtain chest x-ray to look for acute cardiopulmonary process.  If chest x-ray is negative we will have patient follow-up with GI.  Chest x-ray independently reviewed and evaluated by me.  Impression: Sternotomy wires are in place from previous CABG.  No infiltrate or effusion noted on chest x-ray.  Awaiting  radiology overread. Radiology impression no active or acute cardiopulmonary disease.  Do think the patient's symptoms increase when he lays down and also after he eats I am suspicious that his symptoms are coming from reflux disease.  Will have patient double his Prilosec to 20 mg twice daily and follow-up with GI.   Final Clinical Impressions(s) / UC Diagnoses   Final diagnoses:  Cough  Gastroesophageal  reflux disease, unspecified whether esophagitis present     Discharge Instructions      There is no evidence of active disease process on your chest x-ray to indicate why you are having continued cough and shortness of breath.  No your nasal congestion.  There is no evidence of nasal congestion on your physical exam.  The fact that your symptoms of cough increase when you lay down and also after eating I am suspicious that this may be related to your reflux disease.  Increase your omeprazole dose to 20 mg twice daily to help control reflux and also add on Pepcid 20 mg twice daily for management of your reflux symptoms.  Schedule appointment with your gastroenterologist for a reevaluation.     ED Prescriptions     Medication Sig Dispense Auth. Provider   omeprazole (PRILOSEC) 20 MG capsule Take 1 capsule (20 mg total) by mouth 2 (two) times daily before a meal. 60 capsule Margarette Canada, NP      PDMP not reviewed this encounter.   Margarette Canada, NP 07/12/21 1549

## 2021-07-12 NOTE — Telephone Encounter (Signed)
I spoke with pt; pt did not go to UC or ED.pt said had covid about 6 wks ago; pt has been coughing for 2 months; pt has SOB after exertion; pt said occasionally has blood streaked phlegm but not every time. Prod cough with clear to green to brown phlegm. Pt does not think he is in any distress;he wants to stop coughing.I scheduled pt an appt at [facility] UC in [facility] 07/12/21 at 2 PM. UC & ED precautions given and pt voiced understanding. Sending note to Dr Lorelei Pont and Butch Penny CMA.

## 2021-07-12 NOTE — ED Triage Notes (Signed)
Pt c/o cough, chest congestion, nasal congestion, shortness of breath. Started about 2 months ago and worse since he had covid about 6 weeks ago. Denies fever.

## 2021-07-20 ENCOUNTER — Other Ambulatory Visit: Payer: Self-pay | Admitting: Cardiovascular Disease

## 2021-07-27 ENCOUNTER — Ambulatory Visit: Payer: Medicare Other | Admitting: Cardiovascular Disease

## 2021-07-28 ENCOUNTER — Encounter: Payer: Self-pay | Admitting: Physician Assistant

## 2021-07-28 ENCOUNTER — Other Ambulatory Visit: Payer: Self-pay

## 2021-07-28 ENCOUNTER — Ambulatory Visit: Payer: Medicare Other | Admitting: Physician Assistant

## 2021-07-28 VITALS — BP 130/70 | HR 76 | Ht 68.0 in | Wt 193.0 lb

## 2021-07-28 DIAGNOSIS — I2510 Atherosclerotic heart disease of native coronary artery without angina pectoris: Secondary | ICD-10-CM | POA: Diagnosis not present

## 2021-07-28 DIAGNOSIS — Z789 Other specified health status: Secondary | ICD-10-CM | POA: Diagnosis not present

## 2021-07-28 DIAGNOSIS — G4733 Obstructive sleep apnea (adult) (pediatric): Secondary | ICD-10-CM

## 2021-07-28 DIAGNOSIS — I10 Essential (primary) hypertension: Secondary | ICD-10-CM

## 2021-07-28 DIAGNOSIS — Z951 Presence of aortocoronary bypass graft: Secondary | ICD-10-CM

## 2021-07-28 DIAGNOSIS — E781 Pure hyperglyceridemia: Secondary | ICD-10-CM | POA: Diagnosis not present

## 2021-07-28 DIAGNOSIS — E785 Hyperlipidemia, unspecified: Secondary | ICD-10-CM

## 2021-07-28 MED ORDER — REPATHA SURECLICK 140 MG/ML ~~LOC~~ SOAJ: 1.0000 "pen " | SUBCUTANEOUS | 11 refills | Status: DC

## 2021-07-28 MED ORDER — REPATHA SURECLICK 140 MG/ML ~~LOC~~ SOAJ: 1.0000 "pen " | SUBCUTANEOUS | 3 refills | Status: DC
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
## 2021-07-28 NOTE — Patient Instructions (Signed)
Medication Instructions:   Your physician has recommended you make the following change in your medication:    START taking Repatha 140 MG SubQ injection every 14 days.  *If you need a refill on your cardiac medications before your next appointment, please call your pharmacy*   Lab Work:  Your physician recommends that you return for lab work in: [place] will need to be fasting. Please do not have anything to eat or drink after midnight the morning you have the lab work. You may only have water or black coffee with no cream or sugar.   Our office will call you closer to the 8 week time line and schedule you for an appointment to come in for your Labs.    Testing/Procedures: None ordered   Follow-Up: At [facility], you and your health needs are our priority.  As part of our continuing mission to provide you with exceptional heart care, we have created designated Provider Care Teams.  These Care Teams include your primary Cardiologist (physician) and Advanced Practice Providers (APPs -  Physician Assistants and Nurse Practitioners) who all work together to provide you with the care you need, when you need it.  We recommend signing up for the patient portal called "MyChart".  Sign up information is provided on this After Visit Summary.  MyChart is used to connect with patients for Virtual Visits (Telemedicine).  Patients are able to view lab/test results, encounter notes, upcoming appointments, etc.  Non-urgent messages can be sent to your provider as well.   To learn more about what you can do with MyChart, go to [email or web address].    Your next appointment:   6-2month(s)  The format for your next appointment:   In Person  Provider:   You may see Kathlyn Sacramento, MD or one of the following Advanced Practice Providers on your designated Care Team:   Murray Hodgkins, NP Christell Faith, PA-C Marrianne Mood, PA-C Cadence Kathlen Mody, [place]   Other Instructions

## 2021-07-30 ENCOUNTER — Telehealth: Payer: Self-pay

## 2021-07-30 NOTE — Telephone Encounter (Signed)
Patient calling needing assistance with repatha prescription high priced

## 2021-07-30 NOTE — Telephone Encounter (Signed)
Prior Authorization for Repatha 140mg /mL initiated in [email or web address]. KEY: [id number] Clinical questions have been asnwered.  RESPONSE: Your information has been sent to [facility]. Please wait for [facility] Medicare 2017 NCPDP to return a determination.

## 2021-08-02 NOTE — Telephone Encounter (Signed)
Request Reference Number: [id number]. REPATHA SURE INJ 140MG /ML is approved through 01/28/2022. Your patient may now fill this prescription and it will be covered.   Per Cici at [facility], Patient's copay is $226 per 90 days with insurance approval.

## 2021-08-11 NOTE — Telephone Encounter (Signed)
Per Marrianne Mood, PA-C, send Rx for Praluent to see if there is better coverage for this medication. Spoke with patient to inform him of medication change however he states he is not interested and "will just stick with what he has been taking".

## 2021-08-20 ENCOUNTER — Other Ambulatory Visit: Payer: Self-pay

## 2021-08-20 MED ORDER — CLOPIDOGREL BISULFATE 75 MG PO TABS: ORAL_TABLET | ORAL | 0 refills | Status: DC

## 2021-08-20 NOTE — Telephone Encounter (Signed)
*  STAT* If patient is at the pharmacy, call can be transferred to refill team.   1. Which medications need to be refilled? (please list name of each medication and dose if known) Plavix  2. Which pharmacy/location (including street and city if local pharmacy) is medication to be sent to? [facility]  3. Do they need a 30 day or 90 day supply? [place]

## 2021-08-24 ENCOUNTER — Telehealth: Payer: Self-pay | Admitting: Cardiovascular Disease

## 2021-08-24 NOTE — Telephone Encounter (Signed)
Called patient back and left a detailed VM per DPR on file that I went ahead and cancelled his Lipid and Liver function tests as his Repatha was not approved, and patient declined starting Praluent at this time per previous notes in chart. Encouraged patient to call back with any questions or concerns.   OV note from 07/28/21 - Previous LDL and triglycerides not at goal.  Financial constraints precluded addition of Vascepa.  He has been intolerant to higher dose atorvastatin and Zetia 2/2 myalgias.  Discussed PCSK9 inhibitors versus lipid clinic referral.  He is agreeable to start PCSK9 inhibitors today over lipid clinic referral or trial of rosuvastatin, given his myalgias in the past.  Will start on Repatha today with repeat lipid and liver function in 6 to 8 weeks.  Reduced alcohol encouraged.  Increase activity.  Heart healthy diet.

## 2021-08-24 NOTE — Telephone Encounter (Signed)
Patient wants to know if labs still needed given med change did not happen

## 2021-08-26 ENCOUNTER — Encounter: Payer: Self-pay | Admitting: *Deleted

## 2021-08-26 ENCOUNTER — Other Ambulatory Visit: Payer: Self-pay | Admitting: Cardiovascular Disease

## 2021-09-21 ENCOUNTER — Other Ambulatory Visit: Payer: Self-pay

## 2021-09-21 MED ORDER — CLOPIDOGREL BISULFATE 75 MG PO TABS: ORAL_TABLET | ORAL | 1 refills | Status: DC

## 2021-09-26 ENCOUNTER — Other Ambulatory Visit: Payer: Self-pay | Admitting: Family

## 2021-10-05 DIAGNOSIS — M5451 Vertebrogenic low back pain: Secondary | ICD-10-CM | POA: Diagnosis not present

## 2021-10-12 DIAGNOSIS — M5451 Vertebrogenic low back pain: Secondary | ICD-10-CM | POA: Diagnosis not present

## 2021-10-28 DIAGNOSIS — M5416 Radiculopathy, lumbar region: Secondary | ICD-10-CM | POA: Diagnosis not present

## 2021-10-31 ENCOUNTER — Emergency Department (HOSPITAL_COMMUNITY)
Admission: EM | Admit: 2021-10-31 | Discharge: 2021-10-31 | Disposition: A | Discharge status: Home / Self Care | Payer: Medicare Other | Attending: Emergency Medicine | Admitting: Emergency Medicine

## 2021-10-31 ENCOUNTER — Emergency Department (HOSPITAL_COMMUNITY): Payer: Medicare Other

## 2021-10-31 ENCOUNTER — Other Ambulatory Visit: Payer: Self-pay

## 2021-10-31 DIAGNOSIS — C7951 Secondary malignant neoplasm of bone: Secondary | ICD-10-CM | POA: Insufficient documentation

## 2021-10-31 DIAGNOSIS — R9431 Abnormal electrocardiogram [ECG] [EKG]: Secondary | ICD-10-CM | POA: Diagnosis not present

## 2021-10-31 DIAGNOSIS — C3431 Malignant neoplasm of lower lobe, right bronchus or lung: Secondary | ICD-10-CM | POA: Insufficient documentation

## 2021-10-31 DIAGNOSIS — C720 Malignant neoplasm of spinal cord: Secondary | ICD-10-CM | POA: Diagnosis not present

## 2021-10-31 DIAGNOSIS — S22080A Wedge compression fracture of T11-T12 vertebra, initial encounter for closed fracture: Secondary | ICD-10-CM | POA: Diagnosis not present

## 2021-10-31 DIAGNOSIS — R918 Other nonspecific abnormal finding of lung field: Secondary | ICD-10-CM | POA: Insufficient documentation

## 2021-10-31 DIAGNOSIS — J439 Emphysema, unspecified: Secondary | ICD-10-CM | POA: Diagnosis not present

## 2021-10-31 DIAGNOSIS — Z7901 Long term (current) use of anticoagulants: Secondary | ICD-10-CM | POA: Insufficient documentation

## 2021-10-31 DIAGNOSIS — R079 Chest pain, unspecified: Secondary | ICD-10-CM | POA: Diagnosis not present

## 2021-10-31 DIAGNOSIS — S22089A Unspecified fracture of T11-T12 vertebra, initial encounter for closed fracture: Secondary | ICD-10-CM | POA: Insufficient documentation

## 2021-10-31 DIAGNOSIS — Z79899 Other long term (current) drug therapy: Secondary | ICD-10-CM | POA: Diagnosis not present

## 2021-10-31 DIAGNOSIS — K449 Diaphragmatic hernia without obstruction or gangrene: Secondary | ICD-10-CM | POA: Diagnosis not present

## 2021-10-31 DIAGNOSIS — S299XXA Unspecified injury of thorax, initial encounter: Secondary | ICD-10-CM | POA: Diagnosis present

## 2021-10-31 DIAGNOSIS — J9811 Atelectasis: Secondary | ICD-10-CM | POA: Diagnosis not present

## 2021-10-31 DIAGNOSIS — R109 Unspecified abdominal pain: Secondary | ICD-10-CM | POA: Diagnosis not present

## 2021-10-31 DIAGNOSIS — M545 Low back pain, unspecified: Secondary | ICD-10-CM | POA: Diagnosis not present

## 2021-10-31 DIAGNOSIS — X58XXXA Exposure to other specified factors, initial encounter: Secondary | ICD-10-CM | POA: Insufficient documentation

## 2021-10-31 DIAGNOSIS — I700 Atherosclerosis of aorta: Secondary | ICD-10-CM | POA: Diagnosis not present

## 2021-10-31 LAB — BASIC METABOLIC PANEL
Anion gap: 13 (ref 5–15)
BUN: 24 mg/dL — ABNORMAL HIGH (ref 8–23)
CO2: 26 mmol/L (ref 22–32)
Calcium: 9.5 mg/dL (ref 8.9–10.3)
Chloride: 97 mmol/L — ABNORMAL LOW (ref 98–111)
Creatinine, Ser: 1.06 mg/dL (ref 0.61–1.24)
GFR, Estimated: >60 mL/min (ref >60)
Glucose, Bld: 123 mg/dL — ABNORMAL HIGH (ref 70–99)
Potassium: 4 mmol/L (ref 3.5–5.1)
Sodium: 136 mmol/L (ref 135–145)

## 2021-10-31 LAB — URINALYSIS, ROUTINE W REFLEX MICROSCOPIC
Bilirubin Urine: NEGATIVE
Glucose, UA: NEGATIVE mg/dL
Hgb urine dipstick: NEGATIVE
Ketones, ur: NEGATIVE mg/dL
Leukocytes,Ua: NEGATIVE
Nitrite: NEGATIVE
Protein, ur: NEGATIVE mg/dL
Specific Gravity, Urine: 1.025 (ref 1.005–1.030)
pH: 5.5 (ref 5.0–8.0)

## 2021-10-31 LAB — CBC
HCT: 48.2 % (ref 39.0–52.0)
Hemoglobin: 15.6 g/dL (ref 13.0–17.0)
MCH: 30.1 pg (ref 26.0–34.0)
MCHC: 32.4 g/dL (ref 30.0–36.0)
MCV: 93.1 fL (ref 80.0–100.0)
Platelets: 290 10*3/uL (ref 150–400)
RBC: 5.18 MIL/uL (ref 4.22–5.81)
RDW: 13.1 % (ref 11.5–15.5)
WBC: 13.5 10*3/uL — ABNORMAL HIGH (ref 4.0–10.5)
nRBC: 0 % (ref 0.0–0.2)

## 2021-10-31 LAB — LIPASE, BLOOD: Lipase: 30 U/L (ref 11–51)

## 2021-10-31 LAB — TROPONIN I (HIGH SENSITIVITY)
Troponin I (High Sensitivity): 4 ng/L (ref <18)
Troponin I (High Sensitivity): 4 ng/L (ref <18)

## 2021-10-31 IMAGING — CR DG CHEST 2V
2 series · 2 of 2 positions shown · non-contrast
Comparison: [DATE].

CLINICAL DATA: Upper abdominal pain.

EXAM:
CHEST - 2 VIEW

[chest pa]
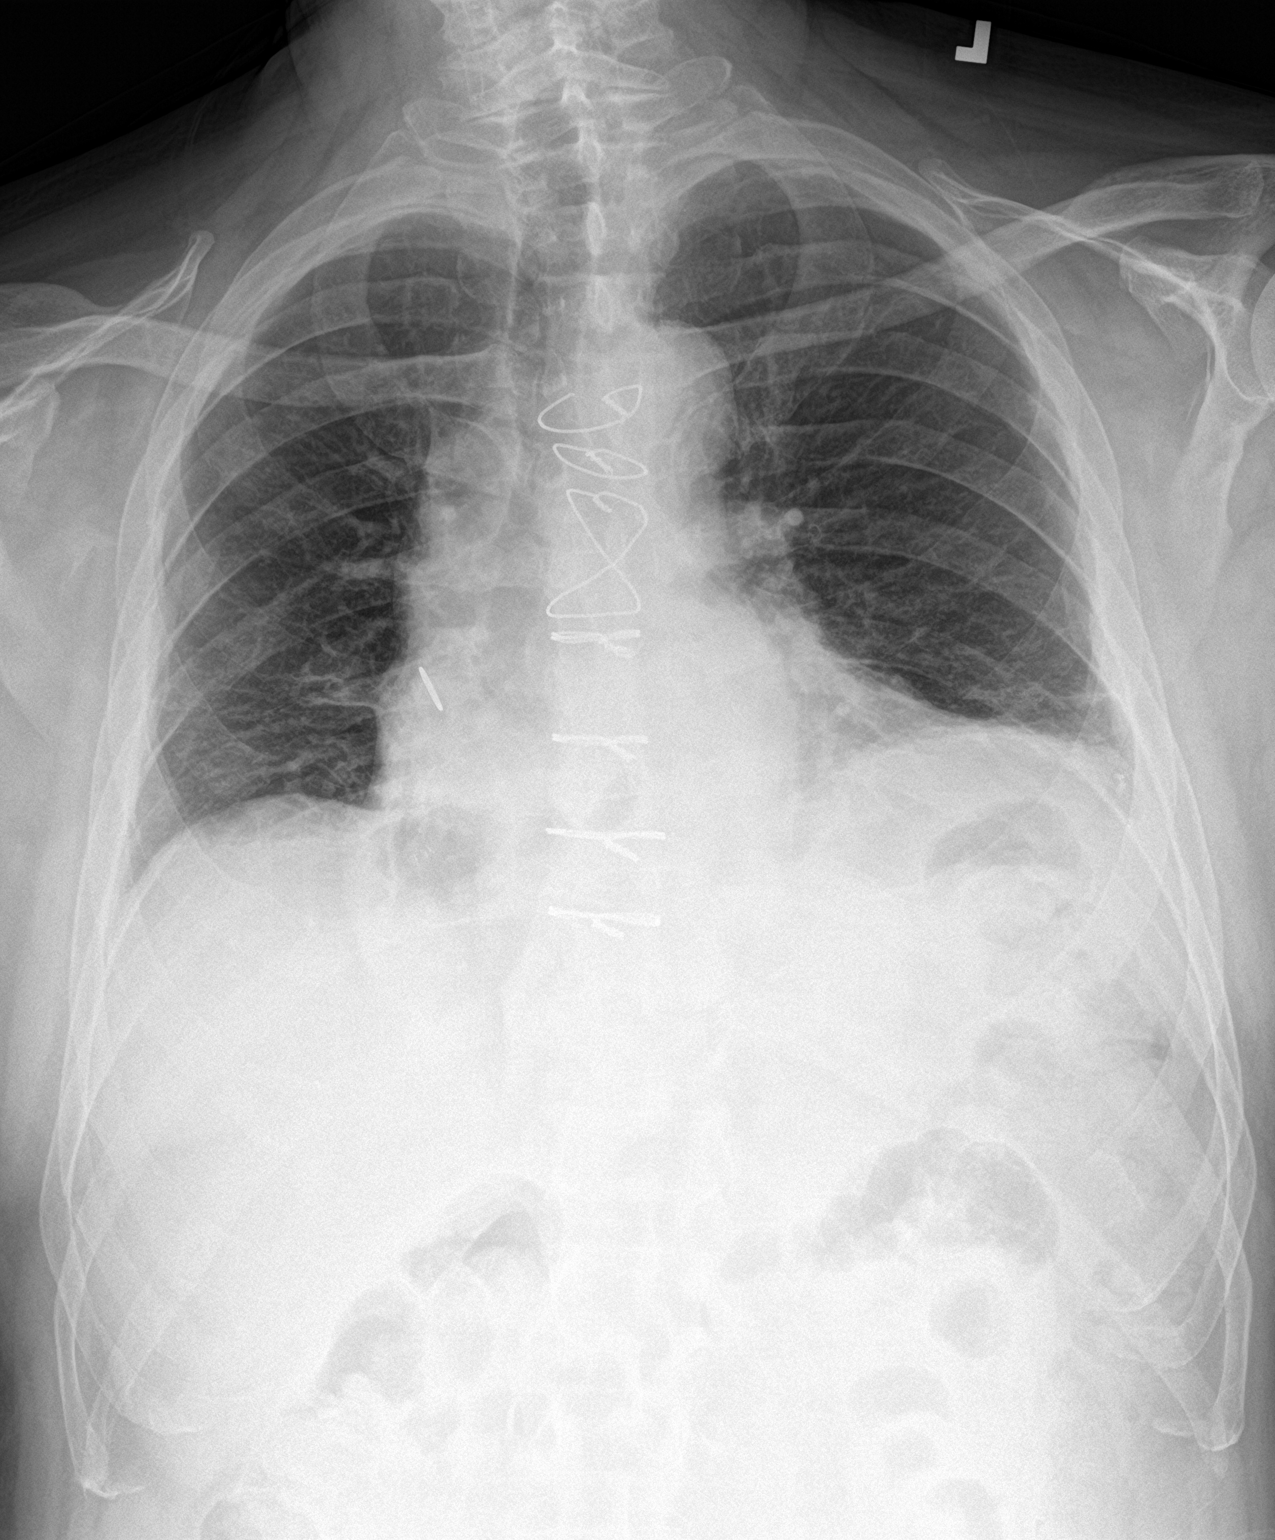

[chest lat]
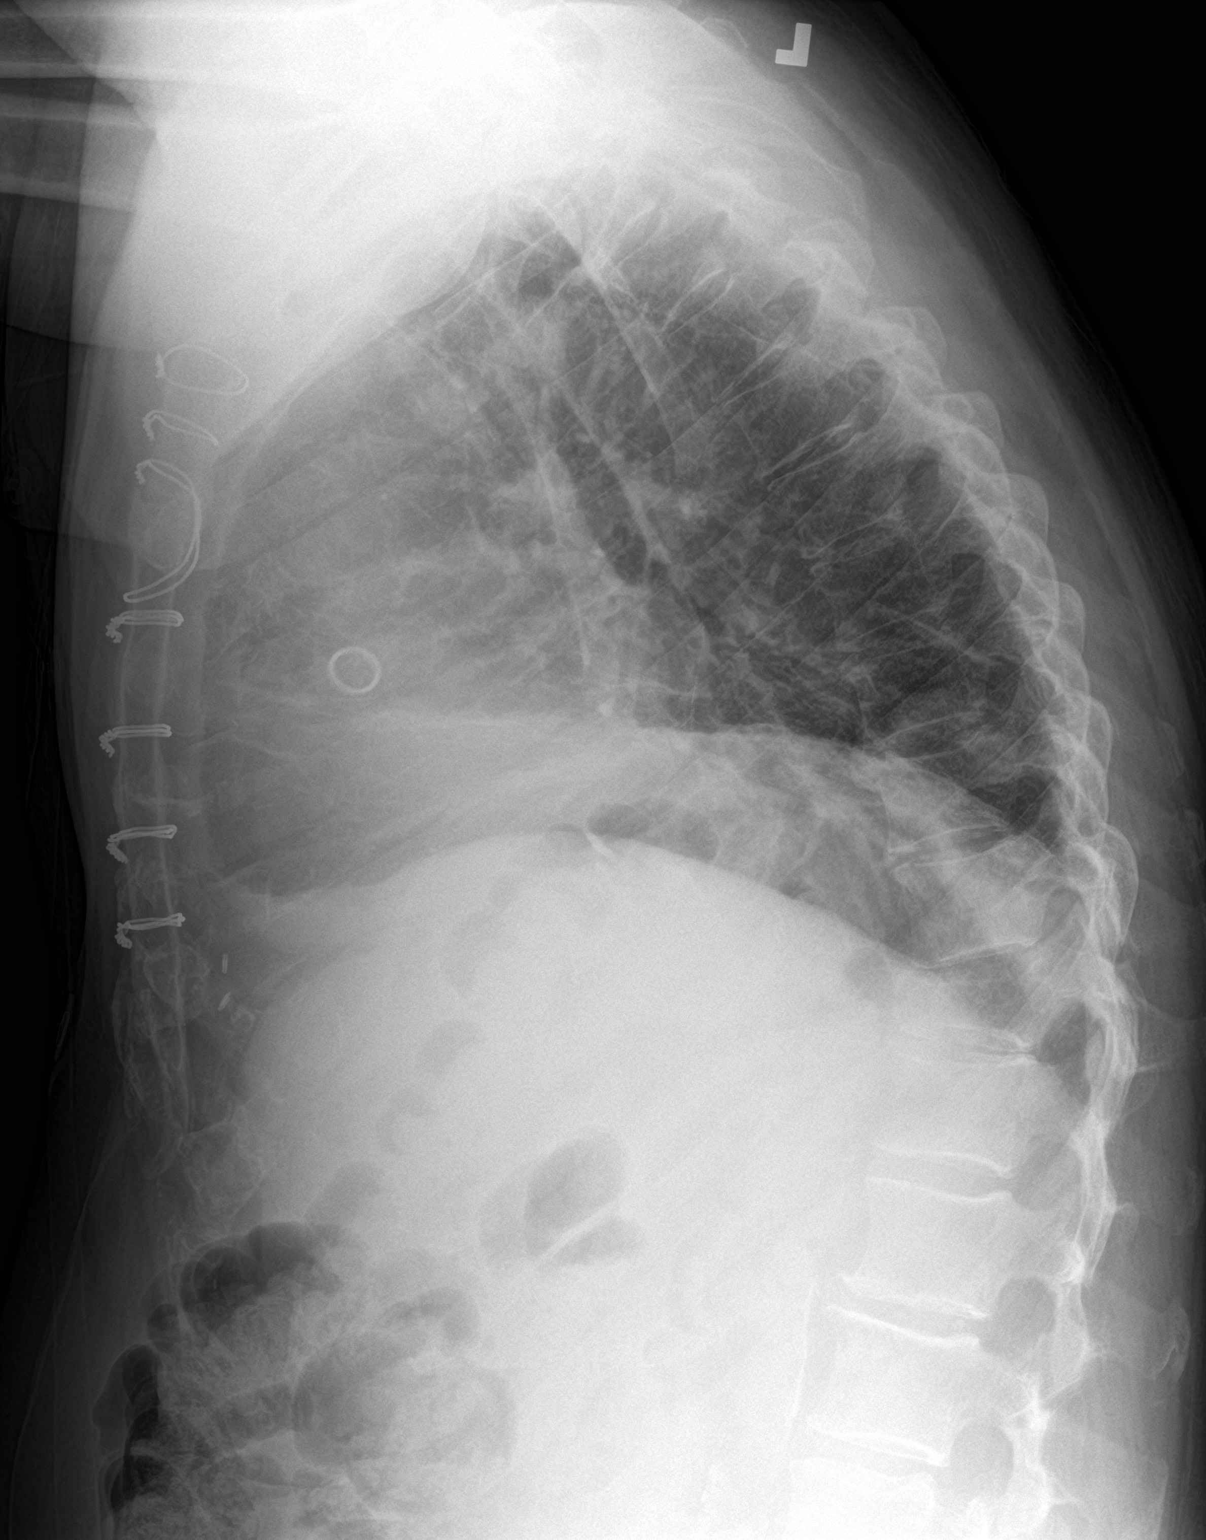

[2 of 2 positions shown; findings below may reference images not displayed]

FINDINGS: The heart size and mediastinal contours are stable. Lung volumes are
low and mild atelectasis is present at the left lung base. No
effusion or pneumothorax. Sternotomy wires are noted over the
midline. No acute osseous abnormality.
IMPRESSION: Low lung volumes with atelectasis at the left lung base.

## 2021-10-31 IMAGING — CT CT L SPINE W/O CM
3 series · 11 of 28 positions shown, 13 images · IV contrast (Omni 300)
Comparison: CTA abdomen and Pelvis today reported separately.

CLINICAL DATA: 70-year-old male with epigastric pain radiating to
the back. Query aortic aneurysm.

EXAM:
CT LUMBAR SPINE WITH CONTRAST
TECHNIQUE: 
TECHNIQUE: Multiplanar CT images of the lumbar spine were
reconstructed from contemporary CTA of the Abdomen and Pelvis.
CONTRAST:  No additional

[Series 2: lumbar 2mm sag · sagittal · 0.34mm/px · 5 of 91 slices shown, 6 images]
[im 31/91  bone]
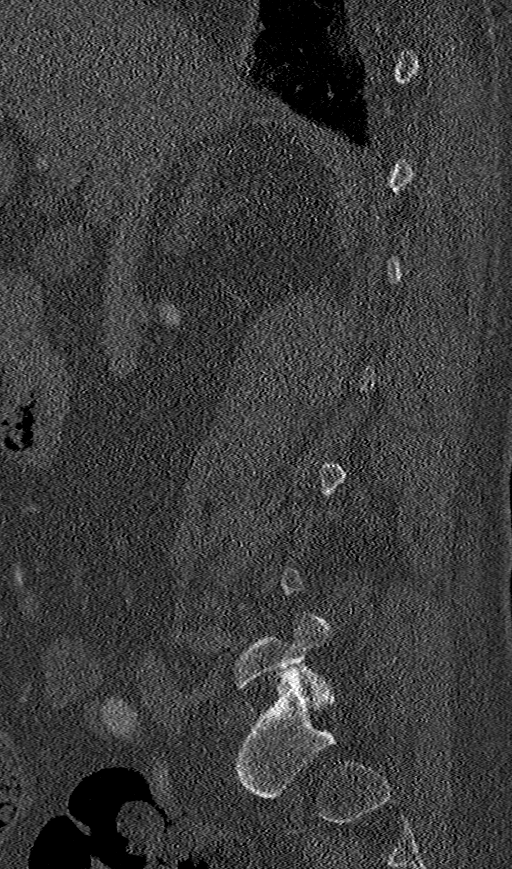
[im 38/91  bone]
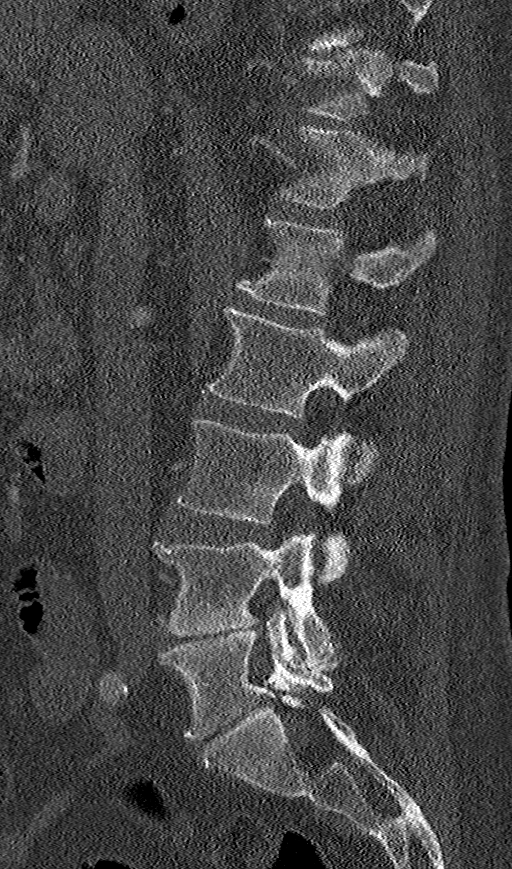
[im 46/91  soft-tissue]
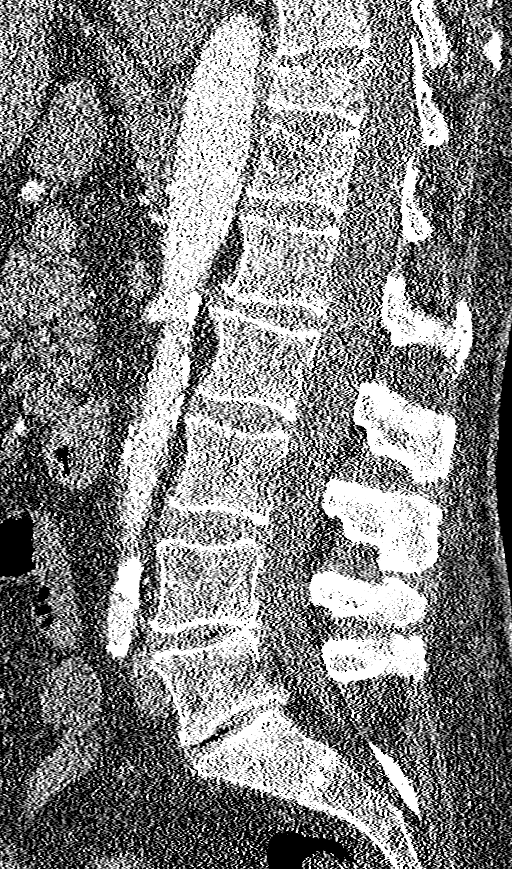
[im 46/91  bone]
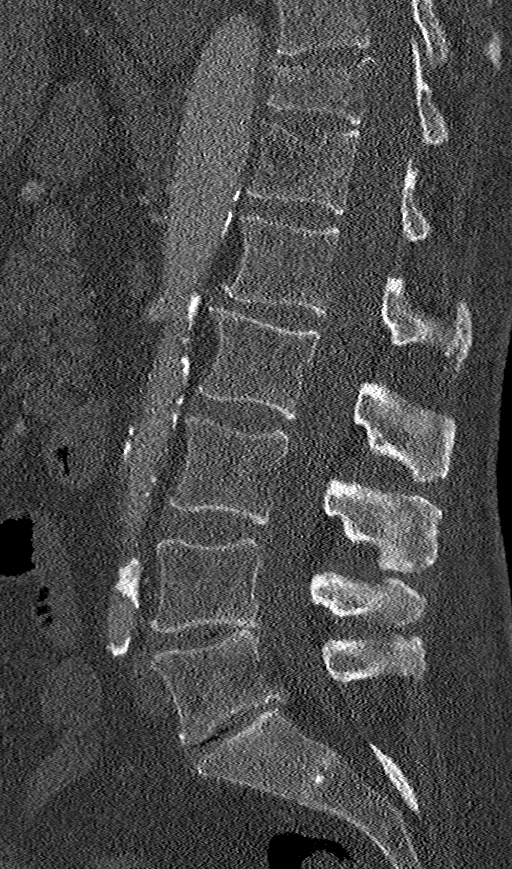
[im 53/91  bone]
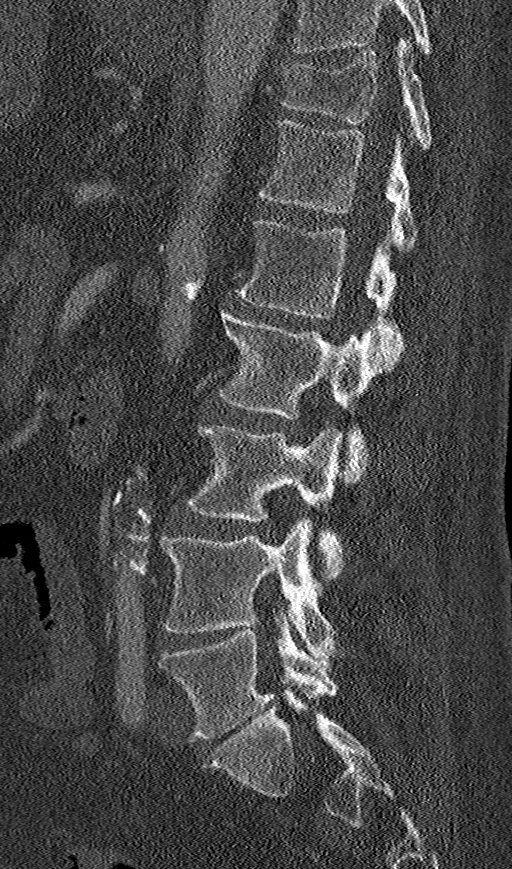
[im 61/91  bone]
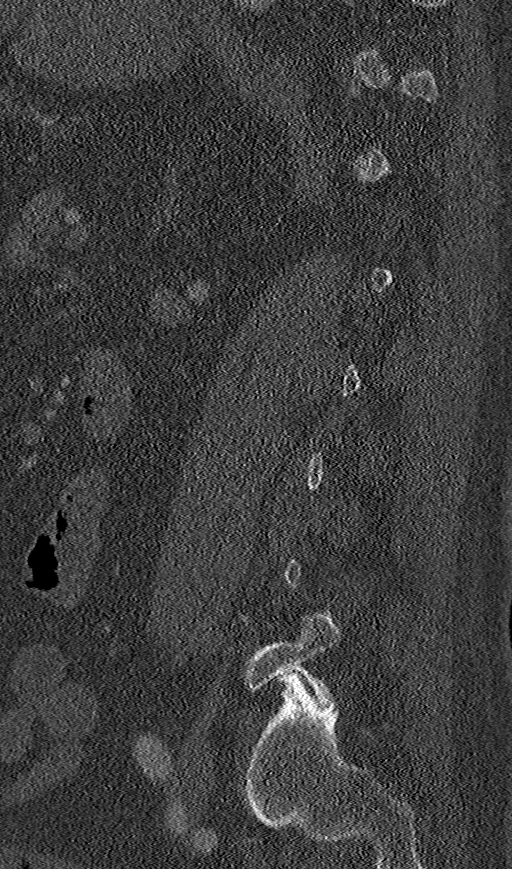

[Series 3: lumbar 2mm cor · coronal · 0.33mm/px · 3 of 87 slices shown]
[im 29/87  bone]
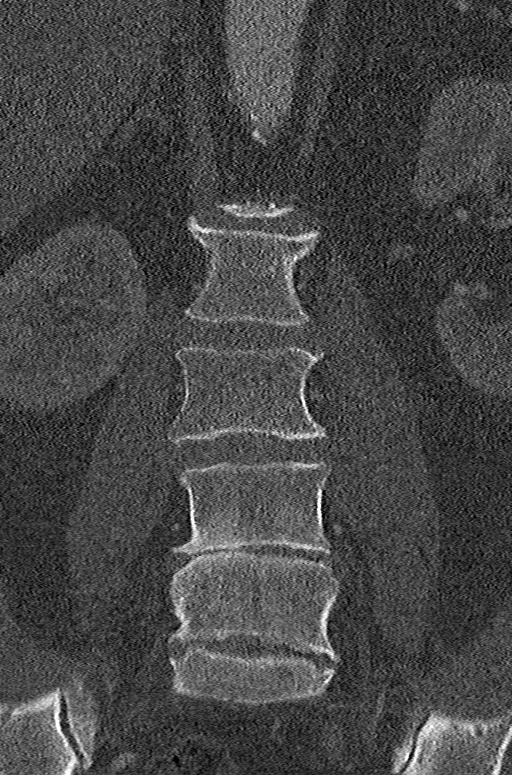
[im 35/87  bone]
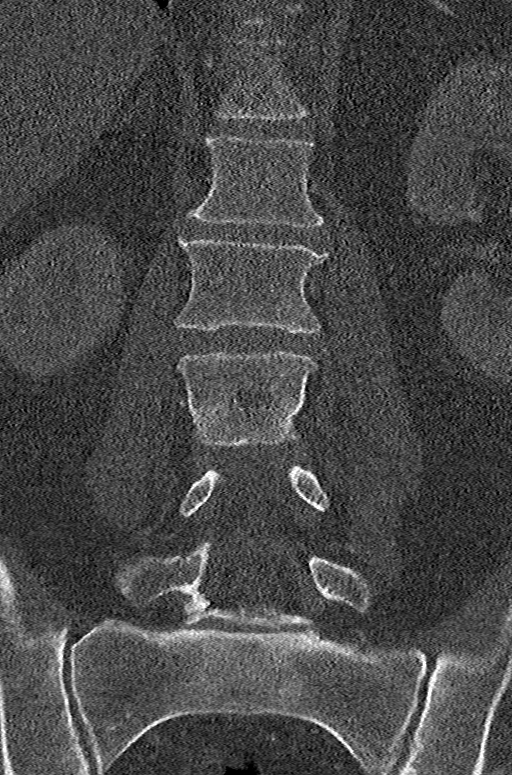
[im 52/87  bone]
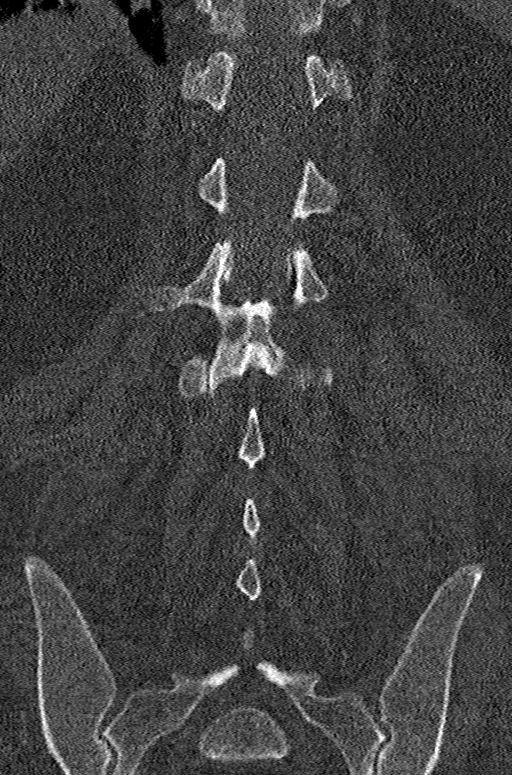

[Series 4: lumbar axial · axial · 0.32mm/px · z∈[-998,-785]mm · 3 of 156 slices shown, 4 images]
[im 26/156  soft-tissue]
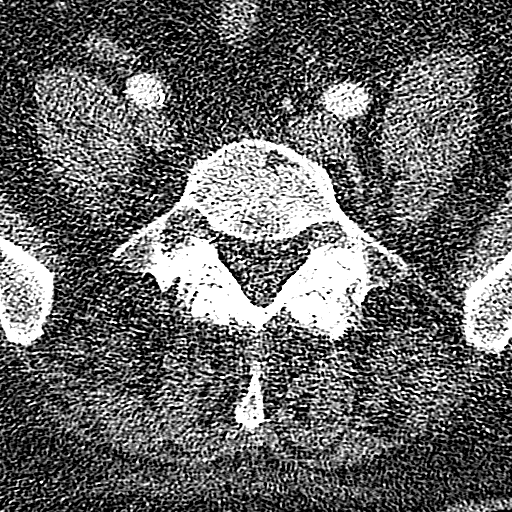
[im 26/156  bone]
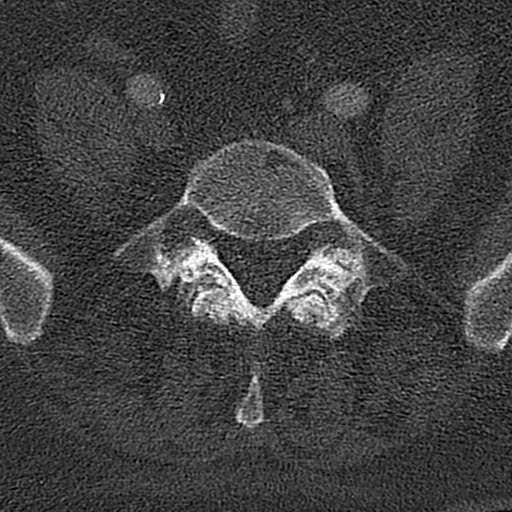
[im 78/156  bone]
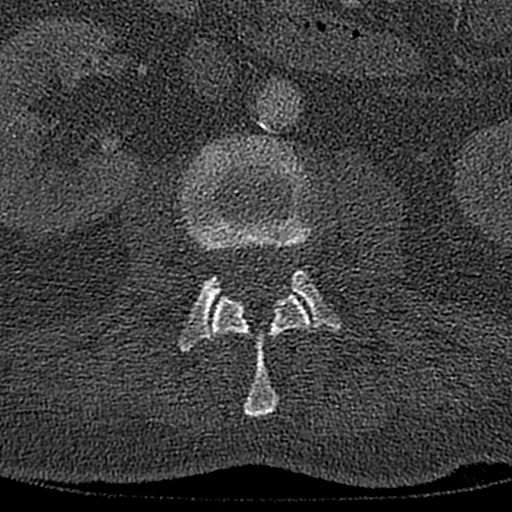
[im 130/156  bone]
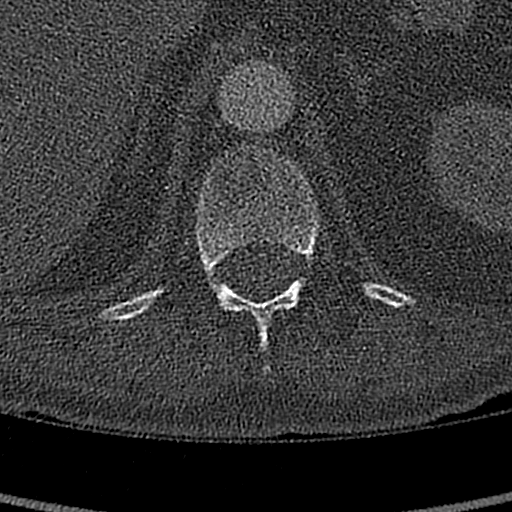

[11 of 28 positions shown; findings below may reference images not displayed]

FINDINGS: Segmentation: Normal.

Alignment: Relatively preserved lumbar lordosis. No
spondylolisthesis.

Vertebrae: Heterogeneous osteolysis in the T11 vertebral body which
is moderately compressed with superior endplate deformity (series 2,
image 49) and demonstrates indistinct posterior extension of soft
tissue into the epidural space at that level. Questionable abnormal
bone mineralization also at T10.

And similar indistinct osteolysis of T12 with mild superior endplate
deformity.

L1 through L3 appear intact with no definite bone lesion. But there
is a round osteolytic lesion at the anterior L4 body. No pathologic
fracture. And similar heterogeneous lucency in the L5 body. No
pathologic fracture.

Visible sacrum and SI joints appear intact. No sclerotic bone
lesion.

Paraspinal and other soft tissues: Abnormal lower thoracic
paraspinal soft tissues most suspicious for extraosseous extension
of tumor. Lumbar paraspinal soft tissues remain within normal
limits.

Abdominal and pelvic viscera are reported separately.

Disc levels: Superimposed lumbar spine degeneration primarily at
L4-L5 and L5-S1. But only mild associated degenerative spinal
stenosis.
IMPRESSION: 1. Evidence of multifocal osseous metastatic disease in the spine.
Moderate pathologic fracture of T11, with suspected epidural and
extraosseous extension of tumor there. Consider encroachment on the
lower thoracic spinal cord, MRI would be confirmatory.
Mild pathologic fracture of T12.
L4 and L5 vertebral metastases also suspected.

2. See also CTA abdomen and Pelvis today reported separately.

3. Lumbar spine degeneration with only mild degenerative spinal
stenosis.

## 2021-10-31 IMAGING — CT CT CTA ABD/PEL W/CM AND/OR W/O CM
2 of 6 series · 13 of 46 positions shown, 15 images · IV contrast (omnipaque)
Comparison: None.

CLINICAL DATA: Aortic aneurysm with pain radiating into the back

EXAM:
CTA ABDOMEN AND PELVIS WITHOUT AND WITH CONTRAST
TECHNIQUE: Multidetector CT imaging of the abdomen and pelvis was performed
using the standard protocol during bolus administration of
intravenous contrast. Multiplanar reconstructed images and MIPs were
obtained and reviewed to evaluate the vascular anatomy.
CONTRAST:  100mL OMNIPAQUE IOHEXOL 350 MG/ML SOLN

[Series 5: thoracic cta 2mm · axial · 0.73mm/px · z∈[-1144,-704]mm · 10 of 265 slices shown, 12 images]
[im 23/265  soft-tissue]
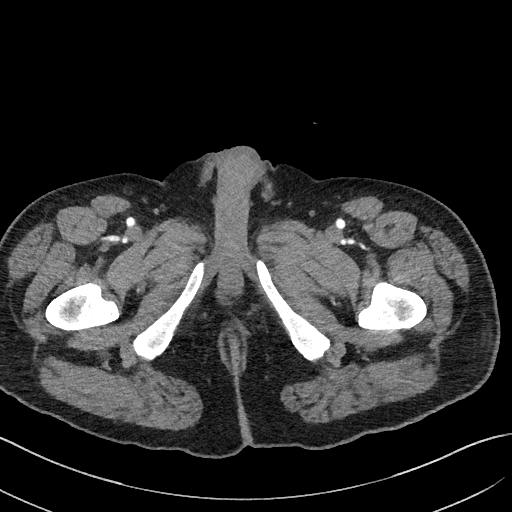
[im 23/265  bone]
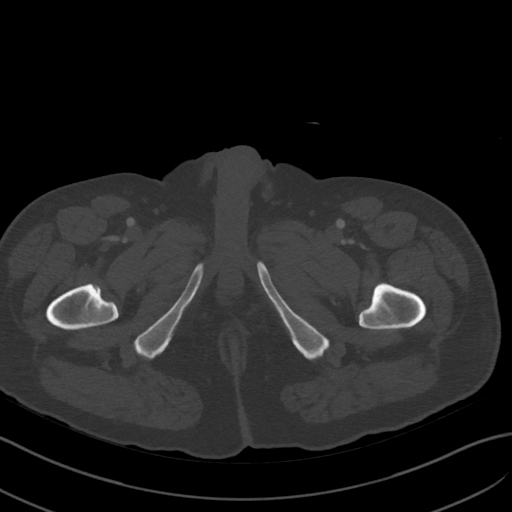
[im 45/265  soft-tissue]
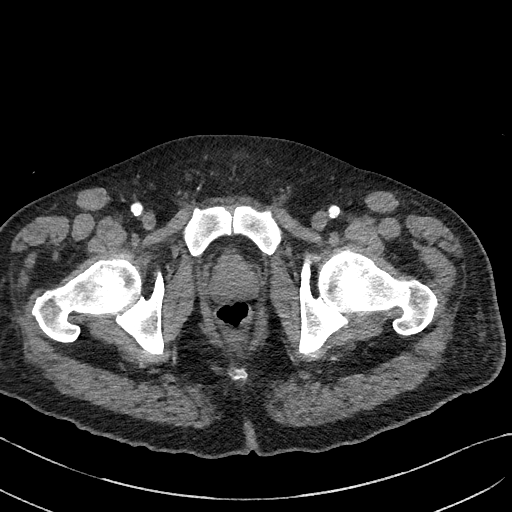
[im 67/265  soft-tissue]
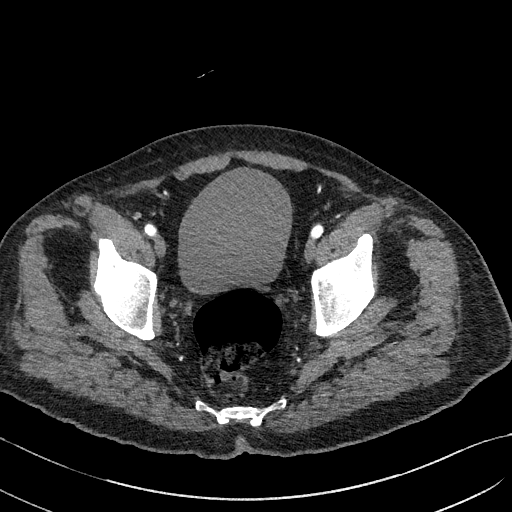
[im 100/265  soft-tissue]
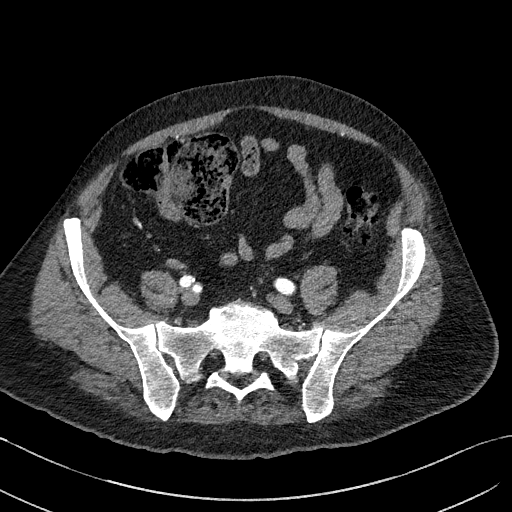
[im 122/265  soft-tissue]
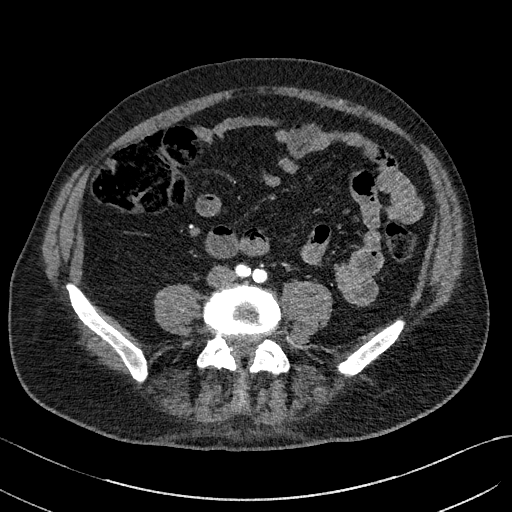
[im 144/265  soft-tissue]
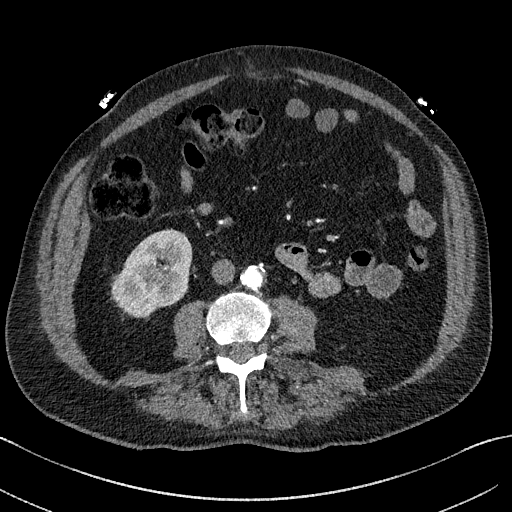
[im 166/265  soft-tissue]
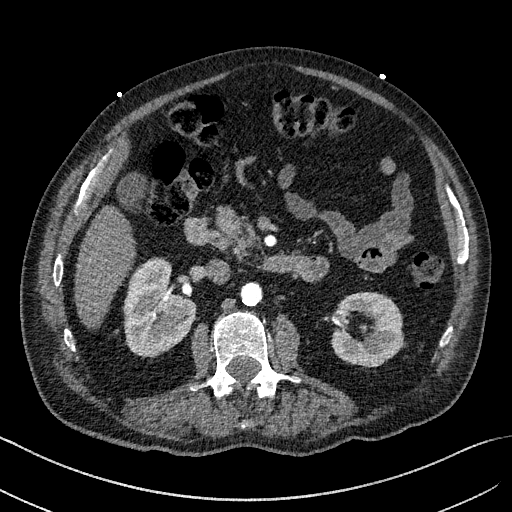
[im 199/265  soft-tissue]
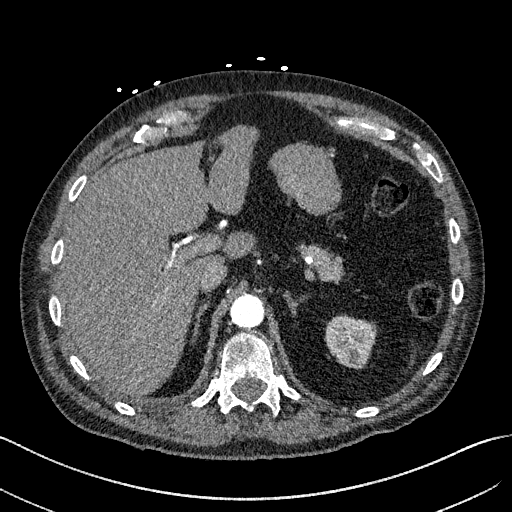
[im 221/265  soft-tissue]
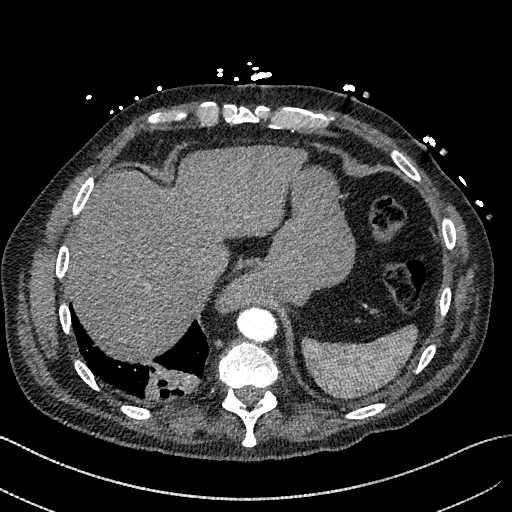
[im 221/265  bone]
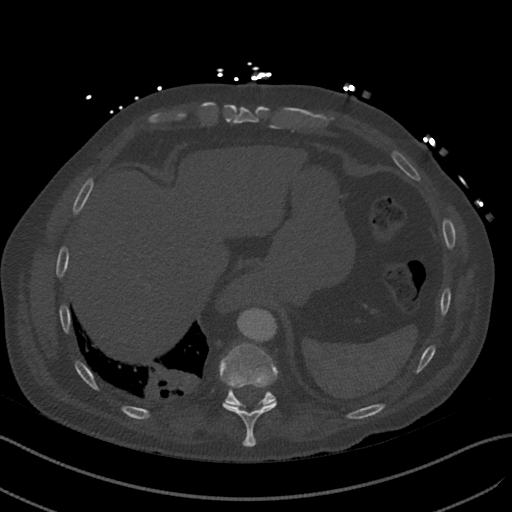
[im 243/265  soft-tissue]
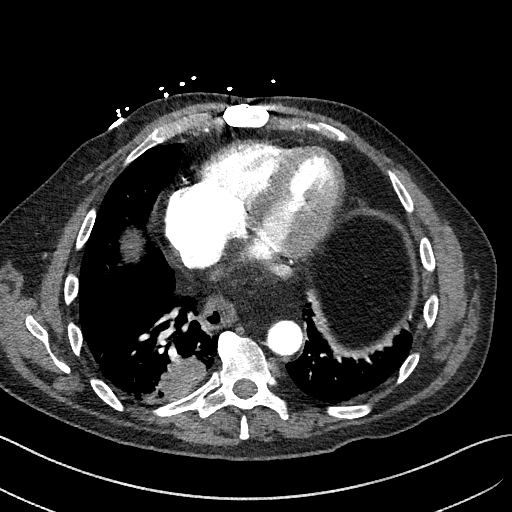

[Series 8: thoracic cta 2mm cor · coronal · 0.78mm/px · 3 of 151 slices shown]
[im 38/151  soft-tissue]
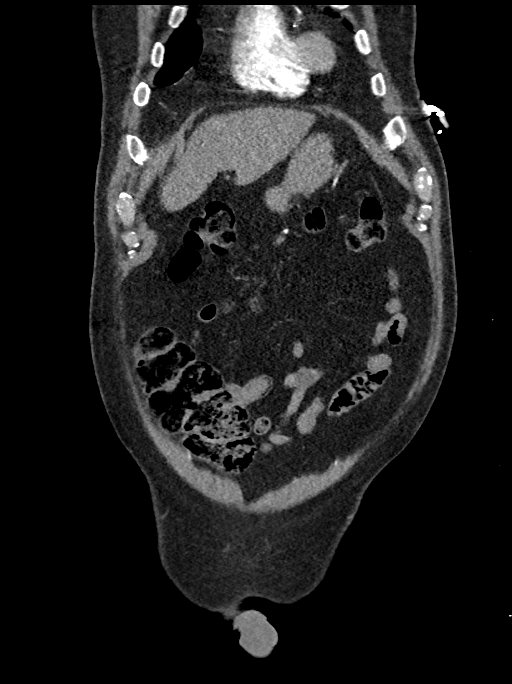
[im 76/151  soft-tissue]
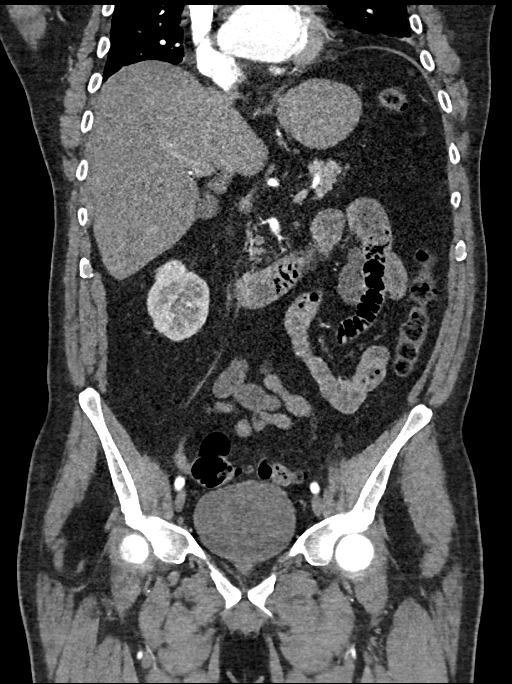
[im 113/151  soft-tissue]
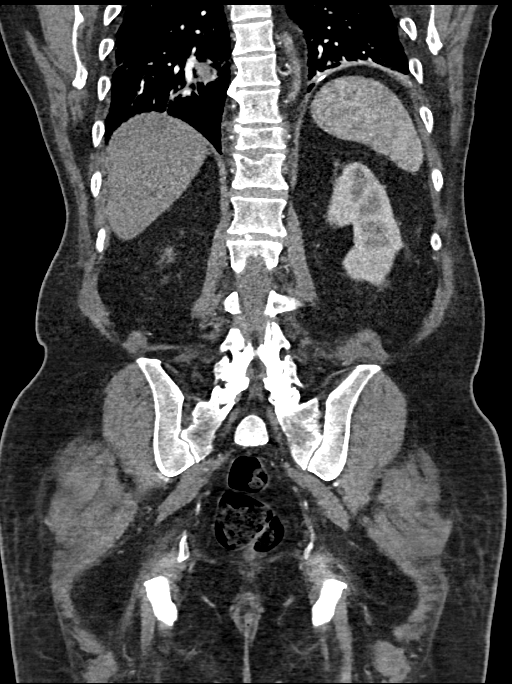

[13 of 46 positions shown; findings below may reference images not displayed]

FINDINGS: VASCULAR

Aorta: The aorta is normal in caliber. Mild atherosclerotic
calcifications. No aneurysm or dissection.

Celiac: Patent without evidence of aneurysm, dissection, vasculitis
or significant stenosis.

SMA: Patent without evidence of aneurysm, dissection, vasculitis or
significant stenosis.

Renals: Both renal arteries are patent without evidence of aneurysm,
dissection, vasculitis, fibromuscular dysplasia or significant
stenosis.

IMA: Patent without evidence of aneurysm, dissection, vasculitis or
significant stenosis.

Inflow: Patent without evidence of aneurysm, dissection, vasculitis
or significant stenosis.

Proximal Outflow: Bilateral common femoral and visualized portions
of the superficial and profunda femoral arteries are patent without
evidence of aneurysm, dissection, vasculitis or significant
stenosis.

Veins: No focal venous abnormality.

Review of the MIP images confirms the above findings.

NON-VASCULAR

Lower chest: Cavitary subpleural posterior right lower lobe mass
measuring approximately 7.4 x 4.2 by 2.9 cm. There appears to be
slight extension through the pleura and into the subpleural fat
(image 24 series 5) but no involvement of the overlying ninth rib.
Atelectasis versus scarring present in the lingula. Mild bronchial
wall thickening. Patient is status post median sternotomy. The heart
is normal in size. No pericardial effusion. Moderately large sliding
hiatal hernia.

Hepatobiliary: No focal liver abnormality is seen. No gallstones,
gallbladder wall thickening, or biliary dilatation.

Pancreas: Unremarkable. No pancreatic ductal dilatation or
surrounding inflammatory changes.

Spleen: Normal in size without focal abnormality.

Adrenals/Urinary Tract: Adrenal glands are unremarkable. Kidneys are
normal, without renal calculi, focal lesion, or hydronephrosis.
Bladder is unremarkable.

Stomach/Bowel: Colonic diverticular disease without CT evidence of
active inflammation. No evidence of obstruction or focal bowel wall
thickening. The appendix is not visualized and may be surgically
absent. The terminal ileum is unremarkable.

Lymphatic: No suspicious lymphadenopathy.

Reproductive: Prostate is unremarkable.

Other: No abdominal wall hernia or abnormality. No abdominopelvic
ascites.

Musculoskeletal: Acute compression fracture at T11 with
approximately 40% height loss. There is mottled decreased
attenuation throughout the vertebral body concerning for metastatic
disease. Additionally, mottled decreased echogenicity with a mildly
in appearance present throughout the T12 vertebral body. Slight
defect in the superior endplate also concerning for an acute
pathologic fracture. No significant height loss. Approximately
cm lucency in the anterior superior aspect of L4 concerning for an
additional lytic lesion. Questionable lucency as well within the
vertebral body at T9, possible additional focus of metastatic
disease. Lucency underlying the superior endplate at L5 consistent
with additional metastatic disease. Incidentally, there is also
degenerative disc disease at L4-L5, L5-S1 and facet arthropathy
bilaterally at 4 5 and 5 1.
IMPRESSION: VASCULAR

1. Negative for abdominal aortic aneurysm, dissection or other acute
vascular abnormality.
2. Aortic Atherosclerosis ([NU]-[NU]).

NON-VASCULAR

1. Unfortunately, CT findings are highly concerning for a centrally
necrotic 7.4 cm primary bronchogenic carcinoma in the posterior
right lower lobe with multifocal osseous metastatic disease
including likely pathologic fractures at T11 and T12. Given central
necrosis, the primary differential consideration is squamous cell
carcinoma. Recommend referral to multi disciplinary thoracic tumor
board for further evaluation and tissue diagnosis.
2. Pathologic T11 compression fracture with approximately 40% height
loss.
3. Likely subtle pathologic fracture involving the superior endplate
of T12 without height loss.
4. Multifocal osseous metastatic disease including at least T9, T11,
T12, L4 and L5.
5. No solid organ metastasis identified within the abdomen or
pelvis.
6. Colonic diverticular disease without CT evidence of active
inflammation.

## 2021-10-31 IMAGING — CT CT CHEST W/ CM
2 of 3 series · 15 of 36 positions shown, 18 images · IV contrast (Omni 300)
Comparison: CT angiogram abdomen and pelvis, [DATE]

CLINICAL DATA: Cancer staging

EXAM:
CT CHEST WITH CONTRAST
TECHNIQUE: Multidetector CT imaging of the chest was performed during
intravenous contrast administration.
CONTRAST:  75mL OMNIPAQUE IOHEXOL 300 MG/ML  SOLN

[Series 3: chest with 2mm st · axial · 0.75mm/px · z∈[-829,-537]mm · 12 of 172 slices shown, 15 images]
[im 13/172  mediastinal]
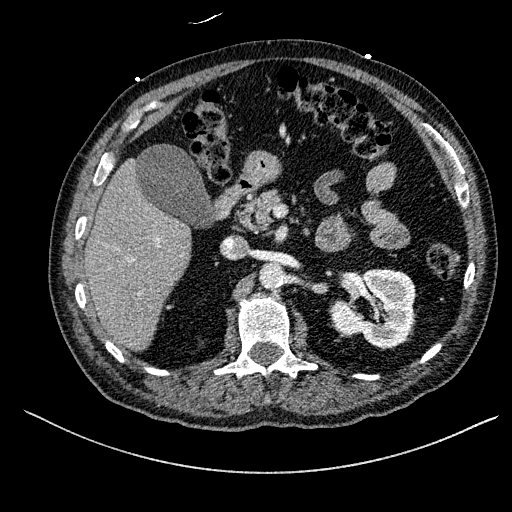
[im 13/172  lung]
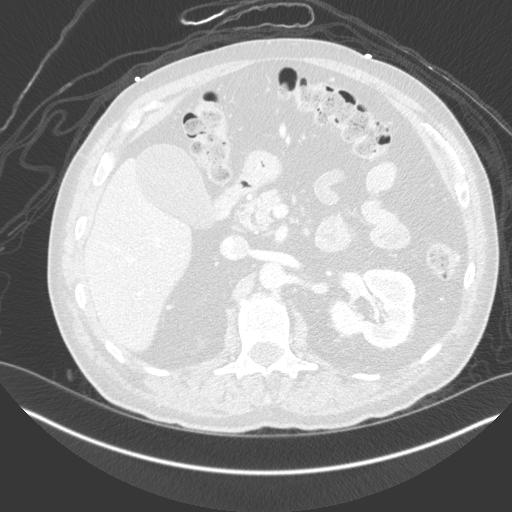
[im 26/172  lung]
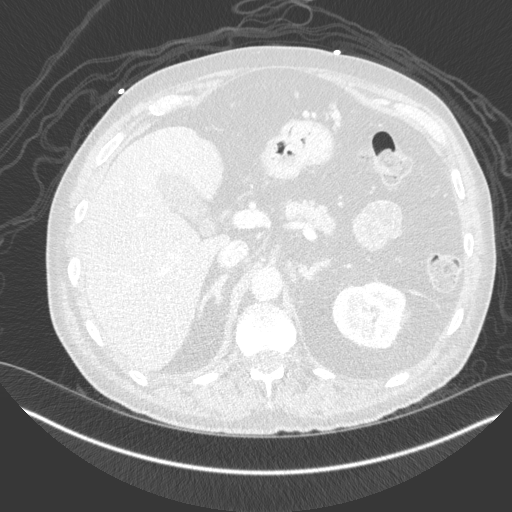
[im 39/172  lung]
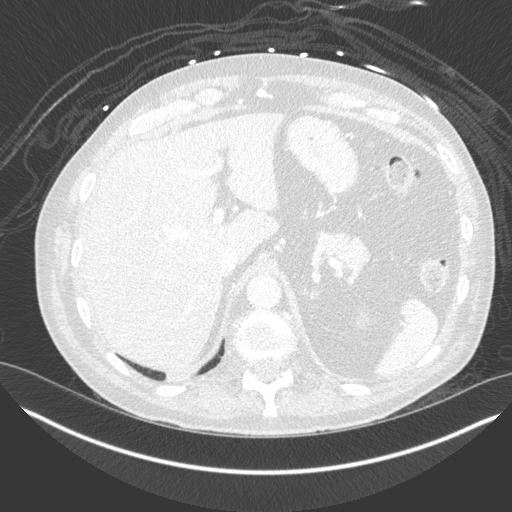
[im 51/172  lung]
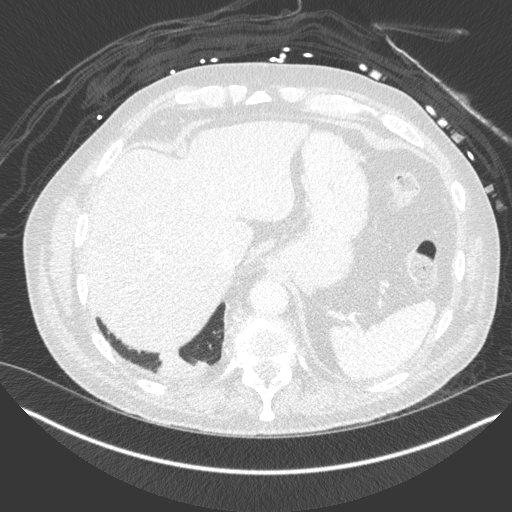
[im 64/172  mediastinal]
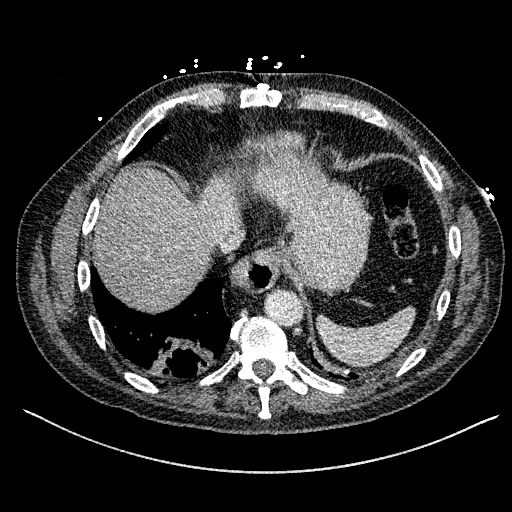
[im 64/172  lung]
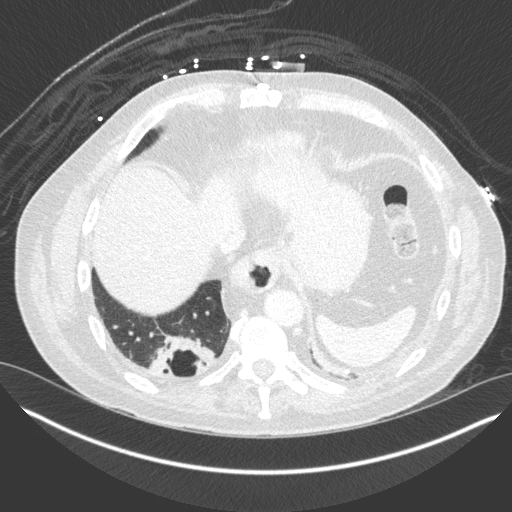
[im 77/172  lung]
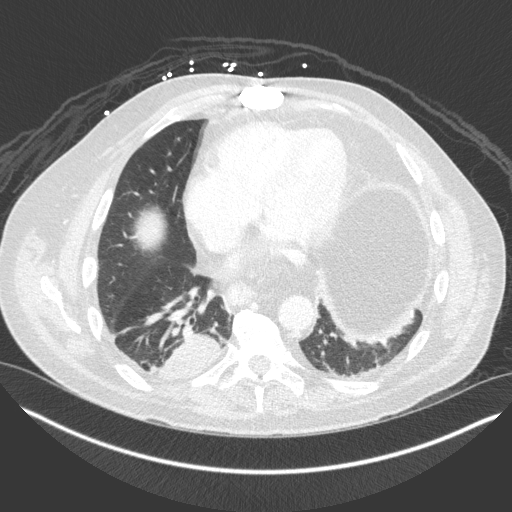
[im 96/172  lung]
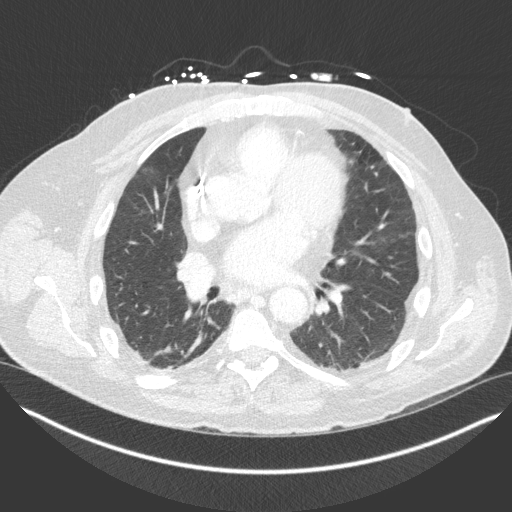
[im 108/172  lung]
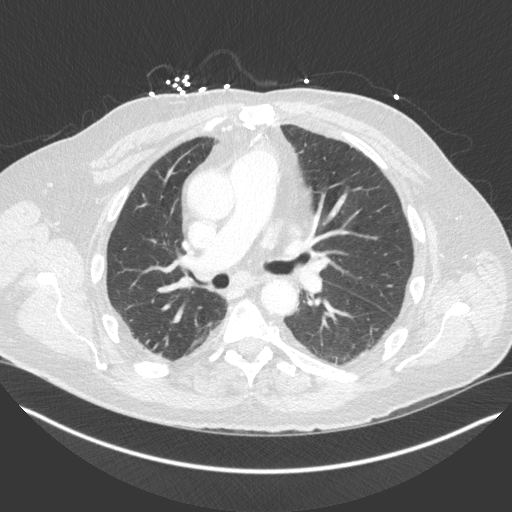
[im 121/172  mediastinal]
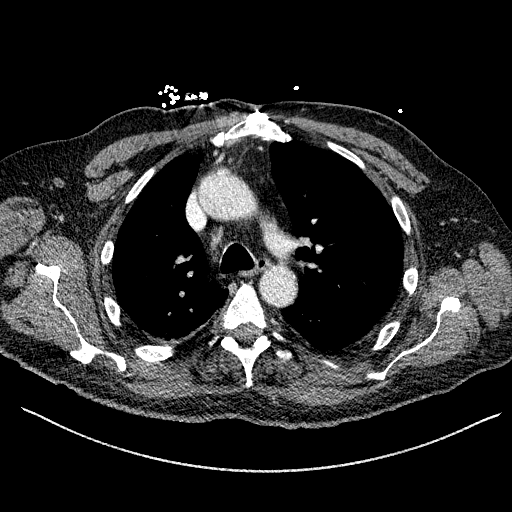
[im 121/172  lung]
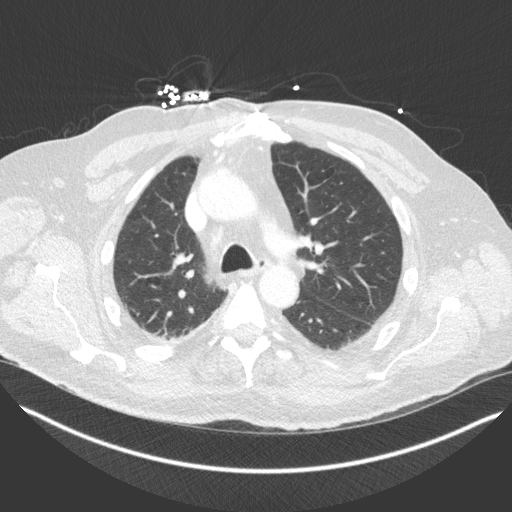
[im 134/172  lung]
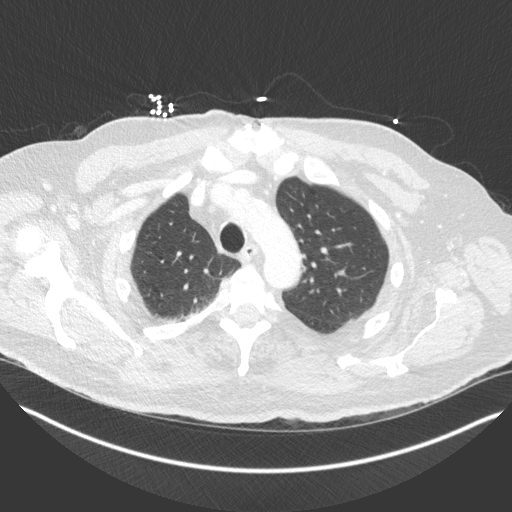
[im 146/172  lung]
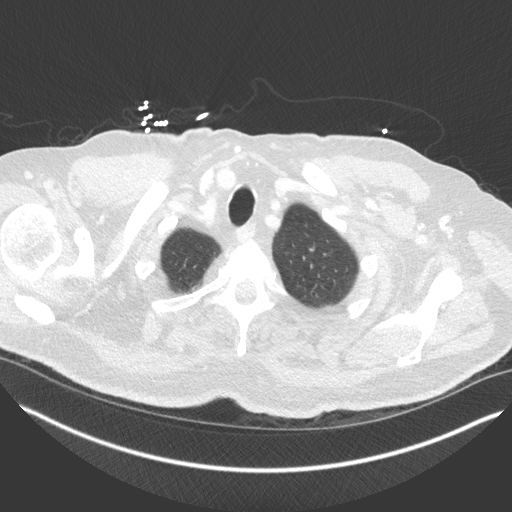
[im 159/172  lung]
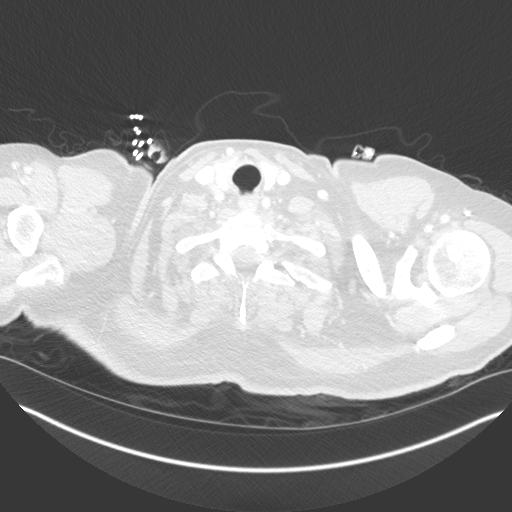

[Series 5: chest with 2mm st cor · coronal · 0.68mm/px · 3 of 131 slices shown]
[im 27/131  lung]
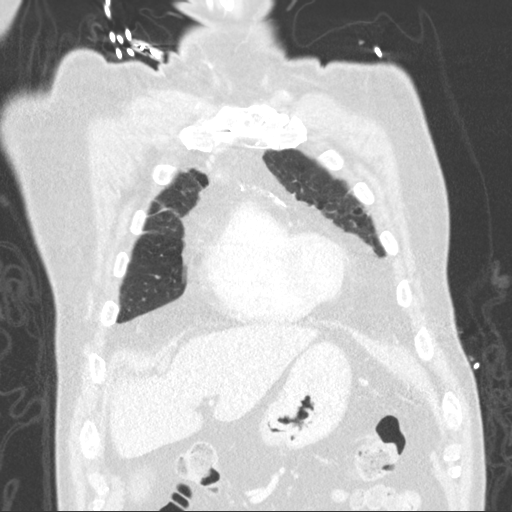
[im 53/131  lung]
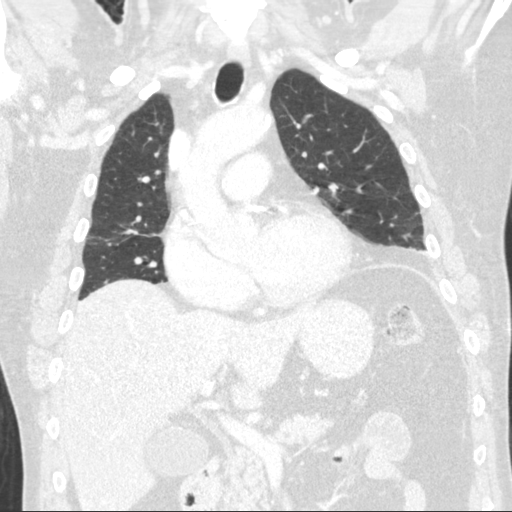
[im 79/131  lung]
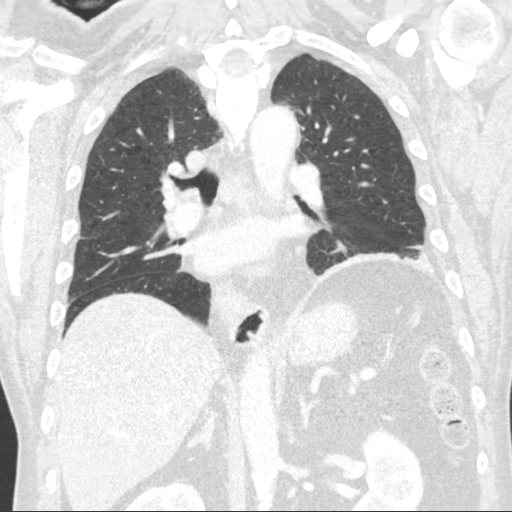

[15 of 36 positions shown; findings below may reference images not displayed]

FINDINGS: Cardiovascular: Aortic atherosclerosis. Cardiomegaly status post
median sternotomy and CABG. Three-vessel coronary artery
calcifications. No pericardial effusion.

Mediastinum/Nodes: No enlarged mediastinal, hilar, or axillary lymph
nodes. Small hiatal hernia. Thyroid gland, trachea, and esophagus
demonstrate no significant findings.

Lungs/Pleura: There is a thick-walled subpleural cavitary lesion of
the dependent right lower lobe measuring 6.9 x 4.9 x 3.3 cm (series
4, image 107). Mild centrilobular emphysema. No pleural effusion or
pneumothorax.

Upper Abdomen: No acute abnormality.

Musculoskeletal: No chest wall mass. Wedge deformity of T11 and
superior endplate deformity of T10 with subtle underlying lytic
character (series 6, image 80). Additional small lucent focus at the
left aspect of T9 (series 3, image 100).
IMPRESSION: 1. There is a thick-walled subpleural cavitary lesion of the
dependent right lower lobe measuring 6.9 cm, highly concerning for
primary lung malignancy. Cavitary infection is however a general
differential consideration.
2. Wedge deformity of T11 and superior endplate deformity of T10
with subtle underlying lytic character, highly concerning for
osseous metastatic disease and pathologic fracture.
3. Additional small lucent focus at the left aspect of T9,
suspicious for an additional metastatic lesion.
4. No evidence of lymphadenopathy in the chest.
5. Emphysema.
6. Coronary artery disease.

Aortic Atherosclerosis ([DJ]-[DJ]) and Emphysema ([DJ]-[DJ]).

## 2021-10-31 MED ORDER — FENTANYL CITRATE PF 50 MCG/ML IJ SOSY
50.0000 ug | PREFILLED_SYRINGE | Freq: Once | INTRAMUSCULAR | Status: AC
  Administered 2021-10-31: 50 ug via INTRAVENOUS
  Filled 2021-10-31: qty 1

## 2021-10-31 MED ORDER — SENNOSIDES-DOCUSATE SODIUM 8.6-50 MG PO TABS: 1.0000 | ORAL_TABLET | Freq: Every day | ORAL | 0 refills | Status: AC

## 2021-10-31 MED ORDER — OXYCODONE-ACETAMINOPHEN 5-325 MG PO TABS
1.0000 | ORAL_TABLET | Freq: Once | ORAL | Status: AC
  Administered 2021-10-31: 1 via ORAL
  Filled 2021-10-31: qty 1

## 2021-10-31 MED ORDER — HYDROMORPHONE HCL 2 MG PO TABS: 2.0000 mg | ORAL_TABLET | Freq: Three times a day (TID) | ORAL | 0 refills | Status: DC | PRN

## 2021-10-31 MED ORDER — IOHEXOL 300 MG/ML  SOLN
75.0000 mL | Freq: Once | INTRAMUSCULAR | Status: AC | PRN
  Administered 2021-10-31: 75 mL via INTRAVENOUS

## 2021-10-31 MED ORDER — IOHEXOL 350 MG/ML SOLN
100.0000 mL | Freq: Once | INTRAVENOUS | Status: AC | PRN
  Administered 2021-10-31: 100 mL via INTRAVENOUS

## 2021-10-31 MED ORDER — HYDROMORPHONE HCL 2 MG PO TABS
2.0000 mg | ORAL_TABLET | Freq: Once | ORAL | Status: AC
  Administered 2021-10-31: 2 mg via ORAL
  Filled 2021-10-31: qty 1

## 2021-10-31 MED ORDER — ACETAMINOPHEN 325 MG PO TABS: 650.0000 mg | ORAL_TABLET | Freq: Four times a day (QID) | ORAL | 0 refills | Status: DC | PRN

## 2021-10-31 NOTE — ED Provider Triage Note (Signed)
Emergency Medicine Provider Triage Evaluation Note  Carlos Erickson , a 71 y.o. male  was evaluated in triage.  Pt complains of back and abdominal pain.  Patient states that he has been recently dealing with upper back pain.  Earlier today then began experiencing abdominal pain.  States it is diffuse across abdomen but worse along the upper abdomen.  Reports a history of umbilical hernia.  No nausea, vomiting, diarrhea.  No chest pain or shortness of breath.  Physical Exam  BP (!) 148/101 (BP Location: Left Arm)    Pulse 76    Temp 97.6 F (36.4 C)    Resp 18    SpO2 96%  Gen:   Awake, no distress   Resp:  Normal effort  MSK:   Moves extremities without difficulty  Other:  Protuberant abdomen.  Voluntary guarding.  Diffuse tenderness that appears to be worst in the upper abdomen.  Reducible ventral hernia.  Medical Decision Making  Medically screening exam initiated at 2:48 AM.  Appropriate orders placed.  Carlos Erickson was informed that the remainder of the evaluation will be completed by another provider, this initial triage assessment does not replace that evaluation, and the importance of remaining in the ED until their evaluation is complete.   Carlos Sexton, PA-C 10/31/21 [phone]

## 2021-10-31 NOTE — ED Provider Notes (Signed)
[facility] Provider Note   CSN: [id number] Arrival date & time: 10/31/21  0019     History  Chief Complaint  Patient presents with   Back Pain   Abdominal Pain    Carlos Erickson is a 71 y.o. male with a history of reported chronic back pain, follows at orthopedic clinic for this, presented ED with worsening back and abdominal pain.  Patient reports onset of symptoms were about a month and a half, after Thanksgiving, when he was cutting down trees and then picking up branches in his yard.  He said he felt a twinge in his back like he pulled something, and then the next morning had progressively worsening pain in his back.  It has been constant since then.  The pain is significantly worse with movement.  It does not radiate down his leg.  He says the pain is worse than its been before.  He tried muscle relaxers, tried ibuprofen and Tylenol, and has had very minimal relief.  He is also been seen in the orthopedic clinic (he reports it is  [facility]) in the past month, and reports he has been on several courses of prednisone, as well as local steroid injections into the back, and that he had an MRI performed 4 days ago on Thursday, though he does not know the results.  None of the steroid medications have helped.  He came into the ED last night because he was noting that he was also having abdominal pain, which is generalized, beginning yesterday.  He denies nausea, vomiting, diarrhea.  He denies history of smoking or aneurysm.  He denies any history of abdominal surgeries.  He does have a soft abdominal hernia which is chronic.  He denies dysuria hematuria but reports that he has an enlarged prostate and chronic difficulties with urination  HPI     Home Medications Prior to Admission medications   Medication Sig Start Date End Date Taking? Authorizing Provider  acetaminophen (TYLENOL) 325 MG tablet Take 2 tablets (650 mg total) by mouth every 6 (six)  hours as needed for up to 30 doses for mild pain or moderate pain. 10/31/21  Yes Shariq Puig, Carola Rhine, MD  acetaminophen (TYLENOL) 500 MG tablet Take 500 mg by mouth every 6 (six) hours as needed for moderate pain or headache.   Yes [provider]  amLODipine (NORVASC) 10 MG tablet TAKE 1 TABLET BY MOUTH  DAILY Patient taking differently: Take 10 mg by mouth daily. 06/09/21  Yes Loel Dubonnet, NP  atorvastatin (LIPITOR) 20 MG tablet TAKE 1 TABLET BY MOUTH ONCE DAILY Patient taking differently: Take 20 mg by mouth daily. 09/27/21  Yes Loel Dubonnet, NP  carvedilol (COREG) 6.25 MG tablet TAKE 1 TABLET BY MOUTH  TWICE DAILY Patient taking differently: Take 6.25 mg by mouth 2 (two) times daily with a meal. 08/27/21  Yes Wellington Hampshire, MD  clopidogrel (PLAVIX) 75 MG tablet TAKE 1 TABLET BY MOUTH ONCE DAILY Patient taking differently: Take 75 mg by mouth daily. 09/21/21  Yes Wellington Hampshire, MD  HYDROmorphone (DILAUDID) 2 MG tablet Take 1 tablet (2 mg total) by mouth every 8 (eight) hours as needed for up to 21 doses for severe pain. 10/31/21  Yes Khaya Theissen, Carola Rhine, MD  losartan (COZAAR) 100 MG tablet TAKE 1 TABLET BY MOUTH  DAILY Patient taking differently: Take 100 mg by mouth daily. 08/27/21  Yes Wellington Hampshire, MD  omeprazole (PRILOSEC) 20 MG capsule  Take 1 capsule (20 mg total) by mouth 2 (two) times daily before a meal. Patient taking differently: Take 20 mg by mouth daily. 07/12/21  Yes Margarette Canada, NP  predniSONE (DELTASONE) 10 MG tablet Take 10 mg by mouth See admin instructions. Take 1 tab TID for 2 days, 1 tab BID for 5 days, 1 tab daily until finished.   Yes [provider]  senna-docusate (SENOKOT-S) 8.6-50 MG tablet Take 1 tablet by mouth daily for 30 doses. 10/31/21 11/30/21 Yes Taneil Lazarus, Carola Rhine, MD  Evolocumab (REPATHA SURECLICK) 017 MG/ML SOAJ Inject 1 pen into the skin every 14 (fourteen) days. Patient not taking: Reported on 10/31/2021 07/28/21   Marrianne Mood D,  PA-C      Allergies    Plasma protein fraction, Icosapent ethyl, and Morphine and related    Review of Systems   Review of Systems  Constitutional:  Negative for chills and fever.  Respiratory:  Negative for cough and shortness of breath.   Cardiovascular:  Negative for chest pain and palpitations.  Gastrointestinal:  Positive for abdominal pain. Negative for abdominal distention, constipation, diarrhea, nausea and vomiting.  Genitourinary:  Negative for dysuria and hematuria.  Musculoskeletal:  Positive for arthralgias, back pain and myalgias.  Skin:  Negative for color change and rash.  Neurological:  Negative for weakness, light-headedness and numbness.  All other systems reviewed and are negative.  Physical Exam Updated Vital Signs BP (!) 144/77 (BP Location: Right Arm)    Pulse (!) 101    Temp 98 F (36.7 C) (Oral)    Resp 15    SpO2 97%  Physical Exam Constitutional:      General: He is not in acute distress. HENT:     Head: Normocephalic and atraumatic.  Eyes:     Conjunctiva/sclera: Conjunctivae normal.     Pupils: Pupils are equal, round, and reactive to light.  Cardiovascular:     Rate and Rhythm: Normal rate and regular rhythm.  Pulmonary:     Effort: Pulmonary effort is normal. No respiratory distress.  Abdominal:     General: There is no distension.     Tenderness: There is no abdominal tenderness. There is no guarding or rebound. Negative signs include Murphy's sign.  Musculoskeletal:     Comments: Bilateral paraspinal muscle tenderness and spasms, no midline spinal tenderness.  Right greater than left CVA tenderness  Skin:    General: Skin is warm and dry.  Neurological:     General: No focal deficit present.     Mental Status: He is alert and oriented to person, place, and time. Mental status is at baseline.  Psychiatric:        Mood and Affect: Mood normal.        Behavior: Behavior normal.    ED Results / Procedures / Treatments   Labs (all labs  ordered are listed, but only abnormal results are displayed) Labs Reviewed  BASIC METABOLIC PANEL - Abnormal; Notable for the following components:      Result Value   Chloride 97 (*)    Glucose, Bld 123 (*)    BUN 24 (*)    All other components within normal limits  CBC - Abnormal; Notable for the following components:   WBC 13.5 (*)    All other components within normal limits  URINALYSIS, ROUTINE W REFLEX MICROSCOPIC  LIPASE, BLOOD  TROPONIN I (HIGH SENSITIVITY)  TROPONIN I (HIGH SENSITIVITY)    EKG EKG Interpretation  Date/Time:  Sunday October 31 2021  00:49:44 EST Ventricular Rate:  88 PR Interval:  132 QRS Duration: 84 QT Interval:  346 QTC Calculation: 418 R Axis:   61 Text Interpretation: Normal sinus rhythm Normal ECG When compared with ECG of 11-Jun-2020 15:29, No significant change was found Confirmed by Delora Fuel ([id number]) on 10/31/2021 1:06:13 AM  Radiology DG Chest 2 View  Result Date: 10/31/2021 CLINICAL DATA:  Upper abdominal pain. EXAM: CHEST - 2 VIEW COMPARISON:  07/12/2021. FINDINGS: The heart size and mediastinal contours are stable. Lung volumes are low and mild atelectasis is present at the left lung base. No effusion or pneumothorax. Sternotomy wires are noted over the midline. No acute osseous abnormality. IMPRESSION: Low lung volumes with atelectasis at the left lung base. Electronically Signed   By: Brett Fairy M.D.   On: 10/31/2021 01:15   CT Chest W Contrast  Result Date: 10/31/2021 CLINICAL DATA:  Cancer staging EXAM: CT CHEST WITH CONTRAST TECHNIQUE: Multidetector CT imaging of the chest was performed during intravenous contrast administration. CONTRAST:  97mL OMNIPAQUE IOHEXOL 300 MG/ML  SOLN COMPARISON:  CT angiogram abdomen and pelvis, 10/31/2021 FINDINGS: Cardiovascular: Aortic atherosclerosis. 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 Three-vessel coronary artery calcifications. No pericardial effusion. Mediastinum/Nodes: No enlarged  mediastinal, hilar, or axillary lymph nodes. Small hiatal hernia. Thyroid gland, trachea, and esophagus demonstrate no significant findings. Lungs/Pleura: There is a thick-walled subpleural cavitary lesion of the dependent right lower lobe measuring 6.9 x 4.9 x 3.3 cm (series 4, image 107). Mild centrilobular emphysema. No pleural effusion or pneumothorax. Upper Abdomen: No acute abnormality. Musculoskeletal: No chest wall mass. Wedge deformity of T11 and superior endplate deformity of U04 with subtle underlying lytic character (series 6, image 80). Additional small lucent focus at the left aspect of T9 (series 3, image 100). IMPRESSION: 1. There is a thick-walled subpleural cavitary lesion of the dependent right lower lobe measuring 6.9 cm, highly concerning for primary lung malignancy. Cavitary infection is however a general differential consideration. 2. Wedge deformity of T11 and superior endplate deformity of V40 with subtle underlying lytic character, highly concerning for osseous metastatic disease and pathologic fracture. 3. Additional small lucent focus at the left aspect of T9, suspicious for an additional metastatic lesion. 4. No evidence of lymphadenopathy in the chest. 5. Emphysema. 6. Coronary artery disease. Aortic Atherosclerosis (ICD10-I70.0) and Emphysema (ICD10-J43.9). Electronically Signed   By: Delanna Ahmadi M.D.   On: 10/31/2021 13:19   CT L-SPINE NO CHARGE  Result Date: 10/31/2021 CLINICAL DATA:  71 year old male with epigastric pain radiating to the back. Query aortic aneurysm. EXAM: CT LUMBAR SPINE WITH CONTRAST TECHNIQUE: Technique: Multiplanar CT images of the lumbar spine were reconstructed from contemporary CTA of the Abdomen and Pelvis. CONTRAST:  No additional COMPARISON:  CTA abdomen and Pelvis today reported separately. FINDINGS: Segmentation: Normal. Alignment: Relatively preserved lumbar lordosis. No spondylolisthesis. Vertebrae: Heterogeneous osteolysis in the T11 vertebral  body which is moderately compressed with superior endplate deformity (series 2, image 49) and demonstrates indistinct posterior extension of soft tissue into the epidural space at that level. Questionable abnormal bone mineralization also at T10. And similar indistinct osteolysis of T12 with mild superior endplate deformity. L1 through L3 appear intact with no definite bone lesion. But there is a round osteolytic lesion at the anterior L4 body. No pathologic fracture. And similar heterogeneous lucency in the L5 body. No pathologic fracture. Visible sacrum and SI joints appear intact. No sclerotic bone lesion. Paraspinal and other soft tissues: Abnormal lower thoracic paraspinal soft tissues most  suspicious for extraosseous extension of tumor. Lumbar paraspinal soft tissues remain within normal limits. Abdominal and pelvic viscera are reported separately. Disc levels: Superimposed lumbar spine degeneration primarily at L4-L5 and L5-S1. But only mild associated degenerative spinal stenosis. IMPRESSION: 1. 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 Moderate pathologic fracture of T11, with suspected epidural and extraosseous extension of tumor there. Consider encroachment on the lower thoracic spinal cord, MRI would be confirmatory. Mild pathologic fracture of T12. L4 and L5 vertebral metastases also suspected. 2. See also CTA abdomen and Pelvis today reported separately. 3. Lumbar spine degeneration with only mild degenerative spinal stenosis. Electronically Signed   By: Genevie Ann M.D.   On: 10/31/2021 10:53   CT Angio Abd/Pel w/ and/or w/o  Result Date: 10/31/2021 CLINICAL DATA:  Aortic aneurysm with pain radiating into the back EXAM: CTA ABDOMEN AND PELVIS WITHOUT AND WITH CONTRAST TECHNIQUE: Multidetector CT imaging of the abdomen and pelvis was performed using the standard protocol during bolus administration of intravenous contrast. Multiplanar reconstructed images and MIPs were obtained and  reviewed to evaluate the vascular anatomy. CONTRAST:  146mL OMNIPAQUE IOHEXOL 350 MG/ML SOLN COMPARISON:  None. FINDINGS: VASCULAR Aorta: The aorta is normal in caliber. Mild atherosclerotic calcifications. No aneurysm or dissection. Celiac: Patent without evidence of aneurysm, dissection, vasculitis or significant stenosis. SMA: Patent without evidence of aneurysm, dissection, vasculitis or significant stenosis. Renals: Both renal arteries are patent without evidence of aneurysm, dissection, vasculitis, fibromuscular dysplasia or significant stenosis. IMA: Patent without evidence of aneurysm, dissection, vasculitis or significant stenosis. Inflow: Patent without evidence of aneurysm, dissection, vasculitis or significant stenosis. Proximal Outflow: Bilateral common femoral and visualized portions of the superficial and profunda femoral arteries are patent without evidence of aneurysm, dissection, vasculitis or significant stenosis. Veins: No focal venous abnormality. Review of the MIP images confirms the above findings. NON-VASCULAR Lower chest: Cavitary subpleural posterior right lower lobe mass measuring approximately 7.4 x 4.2 by 2.9 cm. There appears to be slight extension through the pleura and into the subpleural fat (image 24 series 5) but no involvement of the overlying ninth rib. Atelectasis versus scarring present in the lingula. Mild bronchial wall thickening. Patient is status post median sternotomy. The heart is normal in size. No pericardial effusion. Moderately large sliding hiatal hernia. Hepatobiliary: No focal liver abnormality is seen. No gallstones, gallbladder wall thickening, or biliary dilatation. Pancreas: Unremarkable. No pancreatic ductal dilatation or surrounding inflammatory changes. Spleen: Normal in size without focal abnormality. Adrenals/Urinary Tract: Adrenal glands are unremarkable. Kidneys are normal, without renal calculi, focal lesion, or hydronephrosis. Bladder is unremarkable.  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 No evidence of obstruction or focal bowel wall thickening. The appendix is not visualized and may be surgically absent. The terminal ileum is unremarkable. Lymphatic: No suspicious lymphadenopathy. Reproductive: Prostate is unremarkable. Other: No abdominal wall hernia or abnormality. No abdominopelvic ascites. Musculoskeletal: Acute compression fracture at T11 with approximately 40% height loss. There is mottled decreased attenuation throughout the vertebral body concerning for metastatic disease. Additionally, mottled decreased echogenicity with a mildly in appearance present throughout the T12 vertebral body. Slight defect in the superior endplate also concerning for an acute pathologic fracture. No significant height loss. Approximately 1.5 cm lucency in the anterior superior aspect of L4 concerning for an additional lytic lesion. Questionable lucency as well within the vertebral body at T9, possible additional focus of metastatic disease. Lucency underlying the superior endplate at L5 consistent with additional metastatic disease. Incidentally, there  is also degenerative disc disease at L4-L5, L5-S1 and facet arthropathy bilaterally at 4 5 and 5 1. IMPRESSION: VASCULAR 1. Negative for abdominal aortic aneurysm, dissection or other acute vascular abnormality. 2. Aortic Atherosclerosis (ICD10-I70.0). NON-VASCULAR 1. Unfortunately, CT findings are highly concerning for a centrally necrotic 7.4 cm primary bronchogenic carcinoma in the posterior right lower lobe with multifocal osseous metastatic disease including likely pathologic fractures at T11 and T12. Given central necrosis, the primary differential consideration is squamous cell carcinoma. Recommend referral to multi disciplinary thoracic tumor board for further evaluation and tissue diagnosis. 2. Pathologic T11 compression fracture with approximately 40% height loss. 3.  Likely subtle pathologic fracture involving the superior endplate of B14 without height loss. 4. Multifocal osseous metastatic disease including at least T9, T11, T12, L4 and L5. 5. No solid organ metastasis identified within the abdomen or pelvis. 6. 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 Electronically Signed   By: Jacqulynn Cadet M.D.   On: 10/31/2021 10:59    Procedures Procedures    Medications Ordered in ED Medications  oxyCODONE-acetaminophen (PERCOCET/ROXICET) 5-325 MG per tablet 1 tablet (1 tablet Oral Given 10/31/21 0353)  fentaNYL (SUBLIMAZE) injection 50 mcg (50 mcg Intravenous Given 10/31/21 0834)  fentaNYL (SUBLIMAZE) injection 50 mcg (50 mcg Intravenous Given 10/31/21 0937)  oxyCODONE-acetaminophen (PERCOCET/ROXICET) 5-325 MG per tablet 1 tablet (1 tablet Oral Given 10/31/21 0937)  iohexol (OMNIPAQUE) 350 MG/ML injection 100 mL (100 mLs Intravenous Contrast Given 10/31/21 1037)  iohexol (OMNIPAQUE) 300 MG/ML solution 75 mL (75 mLs Intravenous Contrast Given 10/31/21 1245)  HYDROmorphone (DILAUDID) tablet 2 mg (2 mg Oral Given 10/31/21 1444)    ED Course/ Medical Decision Making/ A&P                          Medical Decision Making  This patient presents to the ED with concern for abdominal pain and back pain. This involves an extensive number of treatment options, and is a complaint that carries with it a high risk of complications and morbidity.  The differential diagnosis includes disc herniation with paraspinal spasms, which may be complicated with abdominal muscle cramping pain, versus intra-abdominal process including colitis, acute biliary disease, or AAA.  External records from outside source obtained and reviewed including patient MRI from last week, as confirmed by Merla Riches from [facility] (immediate film records not available in our system), who reports patient has "acute vs subacute T11-T12 compression fx, mild foraminal narrowing of  L4-L5-S1."    I ordered and personally interpreted labs.  The pertinent results include: Leukocytosis with a white blood cell count of 13.  Troponin unremarkable.  Lipase within normal limits.  LFTs within normal limits.  No AKI.  UA without blood or evidence of infection.  I ordered imaging studies including CT angiogram of the abdomen, CT scan of the chest I independently visualized and interpreted imaging which showed lesions right lower lobe, compression fractures of T11 and T12, suspicious spinous lesions which may be consistent with mets. I agree with the radiologist interpretation that this clinical impression could be consistent with metastatic cancer  The patient was maintained on a cardiac monitor.  I personally viewed and interpreted the cardiac monitored which showed an underlying rhythm of: Sinus rhythm with occasional PVC  Per my interpretation the patient's ECG shows sinus rhythm without acute ischemic findings  I ordered medication including Percocet and IV fentanyl for pain I have reviewed the patients home medicines and have  made adjustments as needed  Test Considered:   Critical Interventions:  I requested consultation with the oncology, and discussed lab and imaging findings as well as pertinent plan - they recommend: See ED course below  After the interventions noted above, I reevaluated the patient and found that they have: improved  Dispostion:  Based on the entire clinical work-up, I suspect that the patient's symptoms and ongoing pain are related to his acute or subacute thoracic compression fractures.  These are stable fractures.  He will be fitted for TLSO brace and to follow-up with orthopedics this week.  After consideration of the diagnostic results and the patients response to treatment, I feel that the patent would benefit from TLSO brace and outpatient follow-up.  He has an appointment scheduled this week with the orthopedic doctor, which is good follow-up  for his spinal fracture.  Pain medications prescribed for home.  All questions answered  We were able to ambulate the patient, he is mostly difficulty with transition from lying down to sitting upright, but once he is on his feet he is able to ambulate.  Advised son to pick up a walker on the way home for additional assistance.  Home health consult placed for additional support at home.  PT and OT evaluation and possible.  Transition of care team consulted.  Pain medications prescribed.  Clinical Course as of 10/31/21 1528  Sun Oct 31, 2021  1142 The patient and his son at bedside were updated regarding pulmonary lesion and concern for new cancer diagnosis with metastatic disease.  I spoken to the oncologist who is now evaluating the patient, Dr Burr Medico.  Oncologist is recommended CT of the chest with contrast to be performed in the ED if possible, this has been ordered.  There will be an ongoing discussion with the patient his family regarding whether he prefers to stay in the hospital for tissue biopsy or prefer to have this done as an outpatient [MT]  1316 Patient been placed on supplemental oxygen as a preventative measure by nursing staff due to possible mild hypercapnia from the pain medications.  He is now fully awake, weaned off the oxygen and back on room air.  He has been seen by the oncologist.  Because the patient is taking Plavix, the IR team feels that he would need to be off this medication until Thursday, several days, prior to the tissue biopsy.  Therefore the patient is elected to go home at this time.  We were able to complete the CT scan of the chest.  Oncology will arrange for follow-up, including tissue biopsy and likely MRI of the brain and spine.  The patient is agreement with the plan.  We are still awaiting his TLSO brace.  Pain medications will be provided. [MT]    Clinical Course User Index [MT] Shayne Diguglielmo, Carola Rhine, MD          Final Clinical Impression(s) / ED  Diagnoses Final diagnoses:  Pulmonary mass  Closed fracture of eleventh thoracic vertebra, unspecified fracture morphology, initial encounter [facility])  Spine metastasis ([place])  Closed fracture of twelfth thoracic vertebra, unspecified fracture morphology, initial encounter [facility])    Rx / DC Orders ED Discharge Orders          Ordered    HYDROmorphone (DILAUDID) 2 MG tablet  Every 8 hours PRN        10/31/21 1504    senna-docusate (SENOKOT-S) 8.6-50 MG tablet  Daily        10/31/21 1504  acetaminophen (TYLENOL) 325 MG tablet  Every 6 hours PRN        10/31/21 1504              Wyvonnia Dusky, MD 10/31/21 1529

## 2021-10-31 NOTE — ED Notes (Signed)
Pt O2 dropped to 87% on RA. O2 at 2LPM initiated [place]. Pt had 2 doses of fentanyl. No acute distress. Denies sob.

## 2021-10-31 NOTE — Progress Notes (Signed)
Carlos Erickson  [phone]) [phone]   HEMATOLOGY ONCOLOGY INPATIENT CONSULTATION   Carlos Erickson  DOB: Apr 19, 1951  MR#: [id number]  CSN#: [id number]    Requesting Physician: ED Dr. Langston Masker   Patient Care Team: Owens Loffler, MD as PCP - General (Family Medicine) Wellington Hampshire, MD as PCP - Cardiology (Cardiology)  Reason for consult: bone metastasis and right lung mass   History of present illness:  Carlos Erickson is a 71 yo male with PMH of coronary artery disease with non-STEMI in 2015, status post two-vessel CABG in 2015, hypertension, sleep apnea on CPAP, Barrett's esophagitis, presented with worsening back pain to ED. CT scan unfortunately reviewed diffuse bone metastasis with pathologic fracture at T11 and T12.  CT abdomen pelvis showed a large necrotic 7.4 cm mass in the posterior right lower lobe, concerning for primary lung cancer.  Patient states he is mid back pain started around Thanksgiving, 2022.  It was intermittent initially, it has been progressively getting worse lately, especially when he walks, to the point that he has limited ability to walk due to the pain.  He denies any pain radiating to his legs.  He denies any chest pain, cough, or dyspnea.  Review of system showed he has mild diffuse abdominal soreness, no nausea, vomiting, or change of bowel habits.  No recent weight loss.  Of note, patient only smoked for a year when he was 78, and use a cigar for a year before his heart attack.  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   MEDICAL HISTORY:  Past Medical History:  Diagnosis Date   Arthritis    Barrett's esophagus 07/04/2016   pt states he was told he does not have this.   CAD (coronary artery disease), native coronary artery 05/2014   Non-ST elevation myocardial infarction. Echocardiogram showed normal LV systolic function with mild to moderate mitral regurgitation. Cardiac catheterization showed  significant two-vessel coronary artery disease including the RCA and left anterior descending artery. He underwent CABG at [facility]   Colon polyps    GERD (gastroesophageal reflux disease)    Hyperlipidemia    Hypertension    MI (myocardial infarction) ([place])    Past heart attack, 05/11/2014 07/09/2014   Prediabetes    S/P CABG x 2 07/09/2014   Sleep apnea 03/2019   on CPAP    SURGICAL HISTORY: Past Surgical History:  Procedure Laterality Date   CARDIAC CATHETERIZATION  03/2014   [facility]   cataract Right 1980   Dr. Katy Fitch   CORONARY ARTERY BYPASS GRAFT  05/2014   [facility], CABG x 2   EYE SURGERY     HEMORROIDECTOMY     LUNG BIOPSY  2006   [facility]    SOCIAL HISTORY: Social History   Socioeconomic History   Marital status: Married    Spouse name: Not on file   Number of children: 2   Years of education: Not on file   Highest education level: High school graduate  Occupational History   Occupation: retired  Tobacco Use   Smoking status: Former    Types: Cigars    Quit date: 2015    Years since quitting: 8.0   Smokeless tobacco: Never  Vaping Use   Vaping Use: Never used  Substance and Sexual Activity   Alcohol use: Yes    Alcohol/week: 7.0 - 21.0 standard drinks    Types: 7 - 21 Cans of beer per week  Comment: beers 1-3 daily    Drug use: No   Sexual activity: Yes    Partners: Female  Other Topics Concern   Not on file  Social History Narrative   Lives at home with wife   Right handed   Caffeine: 1-2 cups of coffee per day   Social Determinants of Health   Financial Resource Strain: Not on file  Food Insecurity: Not on file  Transportation Needs: Not on file  Physical Activity: Not on file  Stress: Not on file  Social Connections: Not on file  Intimate Partner Violence: Not on file    FAMILY HISTORY: Family History  Problem Relation Age of Onset   Stroke Mother    Hypertension Mother    Heart Problems Mother    Diabetes Mother     ALLERGIES:  is allergic  to plasma protein fraction, icosapent ethyl, and morphine and related.  MEDICATIONS:  No current facility-administered medications for this encounter.   Current Outpatient Medications  Medication Sig Dispense Refill   acetaminophen (TYLENOL) 500 MG tablet Take 500 mg by mouth every 6 (six) hours as needed for moderate pain or headache.     amLODipine (NORVASC) 10 MG tablet TAKE 1 TABLET BY MOUTH  DAILY (Patient taking differently: Take 10 mg by mouth daily.) 90 tablet 3   atorvastatin (LIPITOR) 20 MG tablet TAKE 1 TABLET BY MOUTH ONCE DAILY (Patient taking differently: Take 20 mg by mouth daily.) 90 tablet 3   carvedilol (COREG) 6.25 MG tablet TAKE 1 TABLET BY MOUTH  TWICE DAILY (Patient taking differently: Take 6.25 mg by mouth 2 (two) times daily with a meal.) 180 tablet 3   clopidogrel (PLAVIX) 75 MG tablet TAKE 1 TABLET BY MOUTH ONCE DAILY (Patient taking differently: Take 75 mg by mouth daily.) 90 tablet 1   losartan (COZAAR) 100 MG tablet TAKE 1 TABLET BY MOUTH  DAILY (Patient taking differently: Take 100 mg by mouth daily.) 90 tablet 3   omeprazole (PRILOSEC) 20 MG capsule Take 1 capsule (20 mg total) by mouth 2 (two) times daily before a meal. (Patient taking differently: Take 20 mg by mouth daily.) 60 capsule 0   predniSONE (DELTASONE) 10 MG tablet Take 10 mg by mouth See admin instructions. Take 1 tab TID for 2 days, 1 tab BID for 5 days, 1 tab daily until finished.     Evolocumab (REPATHA SURECLICK) 235 MG/ML SOAJ Inject 1 pen into the skin every 14 (fourteen) days. (Patient not taking: Reported on 10/31/2021) 6 mL 3    REVIEW OF SYSTEMS:   Constitutional: Denies fevers, chills or abnormal night sweats Eyes: Denies blurriness of vision, double vision or watery eyes Ears, nose, mouth, throat, and face: Denies mucositis or sore throat Respiratory: Denies cough, dyspnea or wheezes Cardiovascular: Denies palpitation, chest discomfort or lower extremity swelling Gastrointestinal:  Denies  nausea, heartburn or change in bowel habits Skin: Denies abnormal skin rashes Lymphatics: Denies new lymphadenopathy or easy bruising Neurological:Denies numbness, tingling or new weaknesses Behavioral/Psych: Mood is stable, no new changes  All other systems were reviewed with the patient and are negative.  PHYSICAL EXAMINATION: ECOG PERFORMANCE STATUS: 3 - Symptomatic, >50% confined to bed  Vitals:   10/31/21 1037 10/31/21 1040  BP: (!) 150/94   Pulse: 98   Resp: (!) 25   Temp:    SpO2: (!) 88% 96%   There were no vitals filed for this visit.  GENERAL:alert, no distress and comfortable SKIN: skin color, texture, turgor are normal,  no rashes or significant lesions EYES: normal, conjunctiva are pink and non-injected, sclera clear OROPHARYNX:no exudate, no erythema and lips, buccal mucosa, and tongue normal  NECK: supple, thyroid normal size, non-tender, without nodularity LYMPH:  no palpable lymphadenopathy in the cervical, axillary or inguinal LUNGS: clear to auscultation and percussion with normal breathing effort HEART: regular rate & rhythm and no murmurs and no lower extremity edema ABDOMEN:abdomen soft, non-tender and normal bowel sounds Musculoskeletal:no cyanosis of digits and no clubbing  PSYCH: alert & oriented x 3 with fluent speech NEURO: no focal motor/sensory deficits  LABORATORY DATA:  I have reviewed the data as listed Lab Results  Component Value Date   WBC 13.5 (H) 10/31/2021   HGB 15.6 10/31/2021   HCT 48.2 10/31/2021   MCV 93.1 10/31/2021   PLT 290 10/31/2021   Recent Labs    05/27/21 1151 10/31/21 0041  NA  --  136  K  --  4.0  CL  --  97*  CO2  --  26  GLUCOSE  --  123*  BUN  --  24*  CREATININE  --  1.06  CALCIUM  --  9.5  GFRNONAA  --  >60  PROT 7.3  --   ALBUMIN 4.2  --   AST 26  --   ALT 28  --   ALKPHOS 69  --   BILITOT 1.1  --   BILIDIR 0.1  --   IBILI 1.0*  --     RADIOGRAPHIC STUDIES: I have personally reviewed the  radiological images as listed and agreed with the findings in the report. DG Chest 2 View  Result Date: 10/31/2021 CLINICAL DATA:  Upper abdominal pain. EXAM: CHEST - 2 VIEW COMPARISON:  07/12/2021. FINDINGS: The heart size and mediastinal contours are stable. Lung volumes are low and mild atelectasis is present at the left lung base. No effusion or pneumothorax. Sternotomy wires are noted over the midline. No acute osseous abnormality. IMPRESSION: Low lung volumes with atelectasis at the left lung base. Electronically Signed   By: Brett Fairy M.D.   On: 10/31/2021 01:15   CT L-SPINE NO CHARGE  Result Date: 10/31/2021 CLINICAL DATA:  71 year old male with epigastric pain radiating to the back. Query aortic aneurysm. EXAM: CT LUMBAR SPINE WITH CONTRAST TECHNIQUE: Technique: Multiplanar CT images of the lumbar spine were reconstructed from contemporary CTA of the Abdomen and Pelvis. CONTRAST:  No additional COMPARISON:  CTA abdomen and Pelvis today reported separately. FINDINGS: Segmentation: Normal. Alignment: Relatively preserved lumbar lordosis. No spondylolisthesis. Vertebrae: Heterogeneous osteolysis in the T11 vertebral body which is moderately compressed with superior endplate deformity (series 2, image 49) and demonstrates indistinct posterior extension of soft tissue into the epidural space at that level. Questionable abnormal bone mineralization also at T10. And similar indistinct osteolysis of T12 with mild superior endplate deformity. L1 through L3 appear intact with no definite bone lesion. But there is a round osteolytic lesion at the anterior L4 body. No pathologic fracture. And similar heterogeneous lucency in the L5 body. No pathologic fracture. Visible sacrum and SI joints appear intact. No sclerotic bone lesion. Paraspinal and other soft tissues: Abnormal lower thoracic paraspinal soft tissues most suspicious for extraosseous extension of tumor. Lumbar paraspinal soft tissues remain within  normal limits. Abdominal and pelvic viscera are reported separately. Disc levels: Superimposed lumbar spine degeneration primarily at L4-L5 and L5-S1. But only mild associated degenerative spinal stenosis. IMPRESSION: 1. 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 Moderate pathologic fracture  of T11, with suspected epidural and extraosseous extension of tumor there. Consider encroachment on the lower thoracic spinal cord, MRI would be confirmatory. Mild pathologic fracture of T12. L4 and L5 vertebral metastases also suspected. 2. See also CTA abdomen and Pelvis today reported separately. 3. Lumbar spine degeneration with only mild degenerative spinal stenosis. Electronically Signed   By: Genevie Ann M.D.   On: 10/31/2021 10:53   CT Angio Abd/Pel w/ and/or w/o  Result Date: 10/31/2021 CLINICAL DATA:  Aortic aneurysm with pain radiating into the back EXAM: CTA ABDOMEN AND PELVIS WITHOUT AND WITH CONTRAST TECHNIQUE: Multidetector CT imaging of the abdomen and pelvis was performed using the standard protocol during bolus administration of intravenous contrast. Multiplanar reconstructed images and MIPs were obtained and reviewed to evaluate the vascular anatomy. CONTRAST:  137mL OMNIPAQUE IOHEXOL 350 MG/ML SOLN COMPARISON:  None. FINDINGS: VASCULAR Aorta: The aorta is normal in caliber. Mild atherosclerotic calcifications. No aneurysm or dissection. Celiac: Patent without evidence of aneurysm, dissection, vasculitis or significant stenosis. SMA: Patent without evidence of aneurysm, dissection, vasculitis or significant stenosis. Renals: Both renal arteries are patent without evidence of aneurysm, dissection, vasculitis, fibromuscular dysplasia or significant stenosis. IMA: Patent without evidence of aneurysm, dissection, vasculitis or significant stenosis. Inflow: Patent without evidence of aneurysm, dissection, vasculitis or significant stenosis. Proximal Outflow: Bilateral common femoral and  visualized portions of the superficial and profunda femoral arteries are patent without evidence of aneurysm, dissection, vasculitis or significant stenosis. Veins: No focal venous abnormality. Review of the MIP images confirms the above findings. NON-VASCULAR Lower chest: Cavitary subpleural posterior right lower lobe mass measuring approximately 7.4 x 4.2 by 2.9 cm. There appears to be slight extension through the pleura and into the subpleural fat (image 24 series 5) but no involvement of the overlying ninth rib. Atelectasis versus scarring present in the lingula. Mild bronchial wall thickening. Patient is status post median sternotomy. The heart is normal in size. No pericardial effusion. Moderately large sliding hiatal hernia. Hepatobiliary: No focal liver abnormality is seen. No gallstones, gallbladder wall thickening, or biliary dilatation. Pancreas: Unremarkable. No pancreatic ductal dilatation or surrounding inflammatory changes. Spleen: Normal in size without focal abnormality. Adrenals/Urinary Tract: Adrenal glands are unremarkable. Kidneys are normal, without renal calculi, focal lesion, or hydronephrosis. Bladder is unremarkable. 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 No evidence of obstruction or focal bowel wall thickening. The appendix is not visualized and may be surgically absent. The terminal ileum is unremarkable. Lymphatic: No suspicious lymphadenopathy. Reproductive: Prostate is unremarkable. Other: No abdominal wall hernia or abnormality. No abdominopelvic ascites. Musculoskeletal: Acute compression fracture at T11 with approximately 40% height loss. There is mottled decreased attenuation throughout the vertebral body concerning for metastatic disease. Additionally, mottled decreased echogenicity with a mildly in appearance present throughout the T12 vertebral body. Slight defect in the superior endplate also concerning for an acute pathologic  fracture. No significant height loss. Approximately 1.5 cm lucency in the anterior superior aspect of L4 concerning for an additional lytic lesion. Questionable lucency as well within the vertebral body at T9, possible additional focus of metastatic disease. Lucency underlying the superior endplate at L5 consistent with additional metastatic disease. Incidentally, there is also degenerative disc disease at L4-L5, L5-S1 and facet arthropathy bilaterally at 4 5 and 5 1. IMPRESSION: VASCULAR 1. Negative for abdominal aortic aneurysm, dissection or other acute vascular abnormality. 2. Aortic Atherosclerosis (ICD10-I70.0). NON-VASCULAR 1. Unfortunately, CT findings are highly concerning for a centrally necrotic 7.4 cm primary bronchogenic carcinoma  in the posterior right lower lobe with multifocal osseous metastatic disease including likely pathologic fractures at T11 and T12. Given central necrosis, the primary differential consideration is squamous cell carcinoma. Recommend referral to multi disciplinary thoracic tumor board for further evaluation and tissue diagnosis. 2. Pathologic T11 compression fracture with approximately 40% height loss. 3. Likely subtle pathologic fracture involving the superior endplate of F38 without height loss. 4. Multifocal osseous metastatic disease including at least T9, T11, T12, L4 and L5. 5. No solid organ metastasis identified within the abdomen or pelvis. 6. 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 Electronically Signed   By: Jacqulynn Cadet M.D.   On: 10/31/2021 10:59    ASSESSMENT & PLAN:  71 yo male, with PMH of CAD, S/P CABG in 2015, very limited smoking history (2 years), presented with progressive mid back pain   Right LL lung mass with diffuse bone metastasis, concerning for metastatic lung cancer Pathologic fracture of T11 and T12, secondary to #1 Coronary artery disease, status post CABG in 2015  Recommendations: -I have reviewed  his CT scan images myself and discussed with patient and his son at bedside.  This is highly concerning for metastatic lung cancer to bones, I recommend tissue biopsy.  His right lower lobe lung mass is amendable for percutaneous biopsy by interventional radiology, I spoke with IR Dr. Annamaria Boots today and he agrees.  However patient is on Plavix, last dose on October 30, 2021, he needs to be off Plavix for 5 days before biopsy, so the earliest biopsy date will be this Thursday 1/12  -I recommend CT CHEST with contrast to complete the staging -Patient was admitted brain and thoracic spine MRI.  He is likely a candidate for kyphoplasty to his T11 and T12 lesions.  -pain control with low dose narcotics  -if he is able to get the above scan done and pain gets controlled with oral oxycodone, OK to discharge and I will arrange his biopsy as outpt  -I spoke with Dr. Langston Masker in ED   All questions were answered. The patient knows to call the clinic with any problems, questions or concerns.      Truitt Merle, MD 10/31/2021 12:07 PM

## 2021-10-31 NOTE — Progress Notes (Signed)
Orthopedic Tech Progress Note Patient Details:  Carlos Erickson 05-13-1951 [id number]  [facility] called at 1329 for TLSO VertAlign brace.   Patient ID: Carlos Erickson, male   DOB: 12-22-1950, 71 y.o.   MRN: [id number]  [name] 10/31/2021, 1:31 PM

## 2021-10-31 NOTE — Care Management (Signed)
ED RNCM received consult, spoke with Dr. Langston Masker will follow up with patient  for [facility] since he was discharged home.

## 2021-10-31 NOTE — Discharge Instructions (Addendum)
Your CT scan today showed signs that are concerning for possible cancer.  We do see a mass in your lungs, as well as spots in your spine and weakening of the spine bones.  This could be concerning for type of cancer.  The oncologist who saw you today (Dr Burr Medico) will have her office arrange for follow-up care, including a biopsy of your lungs this week.  They should contact you to confirm this within the next day or 2.  If you do not hear from them, please call the number above for Dr. Burr Medico to confirm your follow-up appointment.  It is very important that you do not take your Plavix for the next several days before your biopsy.  Plavix is a blood thinner that can interfere with your biopsy.  Your doctors may wish to restart the Plavix after he have the biopsy done.  We provided you a back brace because you have 2 fractures in your thoracic spine.  If possible, I recommend that you do follow-up with the orthopedic clinic about your back fractures.  You should wear the brace at all times, except when you are showering, and you can remove it then.  For pain at home you can take Tylenol every 6 hours as prescribed, and then oxycodone 1 to 2 tablets as needed for severe pain every 6 hours.  I also prescribed a stool softener because oxycodone can often lead to constipation.  Please continue drinking plenty of water.   *  I strongly encourage you to buy a walker and use it at home when walking.

## 2021-10-31 NOTE — ED Notes (Signed)
Ortho tech notified of pt TLSO brace order. Ortho tech states it might take some time to get the outside vendor brace. Will continue to monitor.

## 2021-10-31 NOTE — ED Triage Notes (Addendum)
Pt presents to ED with c/o pain to mid back that radiates upwards and new onset bilateral upper abdominal/epigastric pain that is increased with inspiration accompanied with nausea. Pt reports back pain has been ongoing  since Thanksgiving and and has been evaluated by "back doctor" and has had previous MRI for the same. Expresses concerns for new onset abdominal pain. Repeatedly states he "has had a heart attack in the past" but presents with no cardiac complaints and does not feel pain is related.

## 2021-10-31 NOTE — ED Notes (Signed)
TLSO brace applied by outside vendor ortho tech. Education provided to family. MD notified.

## 2021-10-31 NOTE — TOC Transition Note (Signed)
Transition of Care [facility]) - CM/SW Discharge Note   Patient Details  Name: Carlos Erickson MRN: [id number] Date of Birth: 1951/04/25  [facility] CM/SW Contact:  Verdell Carmine, RN Phone Number: 10/31/2021, 3:19 PM   Clinical Narrative:     Patient presented with abdominal pain. CT suspicious for Lung leision. . Oncology saw patient here. He will need follow up Contacted Amy from [facility] for  home helath PT OT social work. OT will assist with aide needs.  They will accept. MD spoken with son regarding a walker.         Patient Goals and CMS Choice        Discharge Placement               Home with Home health        Discharge Plan and Services                          HH Arranged: PT, OT, Nurse's Aide, Social Work   Date [place]: 10/31/21 Time [place]: 1519 Representative spoke with at [place]: Amy  Social Determinants of Health ([place]) Interventions     Readmission Risk Interventions No flowsheet data found.

## 2021-11-01 ENCOUNTER — Encounter (HOSPITAL_COMMUNITY): Payer: Self-pay | Admitting: Radiology

## 2021-11-01 ENCOUNTER — Encounter: Payer: Self-pay | Admitting: *Deleted

## 2021-11-01 ENCOUNTER — Other Ambulatory Visit: Payer: Self-pay | Admitting: Hematology

## 2021-11-01 ENCOUNTER — Other Ambulatory Visit: Payer: Self-pay

## 2021-11-01 ENCOUNTER — Telehealth: Payer: Self-pay | Admitting: Pulmonary Disease

## 2021-11-01 DIAGNOSIS — R918 Other nonspecific abnormal finding of lung field: Secondary | ICD-10-CM

## 2021-11-01 DIAGNOSIS — C3490 Malignant neoplasm of unspecified part of unspecified bronchus or lung: Secondary | ICD-10-CM

## 2021-11-01 NOTE — Progress Notes (Signed)
Oncology Nurse Navigator Documentation  Oncology Nurse Navigator Flowsheets 11/01/2021  Abnormal Finding Date 10/31/2021  Diagnosis Status Additional Work Up  Navigator Follow Up Date: 11/02/2021  Navigator Follow Up Reason: Appointment Review  Navigator Location [facility]  Referral Date to RadOnc/MedOnc 11/01/2021  Navigator Encounter Type Telephone/I received a message from Dr. Burr Medico regarding getting Carlos Erickson scheduled. He is scheduled for MRI's but needs bx. I contacted radiology, Larinda Buttery regarding schedule. She will check with Dr. Annamaria Boots on when to schedule. I called patient with an update on scans and working on bx appt. He verbalized understanding.   Telephone Education;Asess Navigation Needs;Outgoing Call  Patient Visit Type Initial  Treatment Phase Abnormal Scans  Barriers/Navigation Needs Coordination of Care;Education  Education Other  Interventions Coordination of Care;Education;Psycho-Social Support  Acuity Level 3-Moderate Needs (3-4 Barriers Identified)  Coordination of Care Appts  Education Method Verbal  Time Spent with Patient 60

## 2021-11-01 NOTE — Telephone Encounter (Signed)
PCCM:   Contacted by Dr. Burr Medico for patient with lung mass. Concerning for metastatic bone lesions. We need to expedite his biopsy needs.   She has confirmed with patient that he is available for bx on Friday.   Orders placed  Please schedule appt with me in the office on Thursday to meet patient. Ok to double book in the 130pm slot and ask him to come early.    Garner Nash, DO [facility] 11/01/2021 4:41 PM

## 2021-11-01 NOTE — Progress Notes (Signed)
Patient Name  Mahad, Newstrom Legal Sex  Male DOB  [birth year] SSN  [id number] Address  [address]  [place] [address] Phone  [phone] (Home) *[phone] (Mobile)    RE: CT Biopsy Received: Today Truitt Merle, MD  Laural Benes, MD Yes, OK to hold Plavix. I told him yesterday, he has been office since yesterday 1/8.   Krista Blue        Previous Messages   ----- Message -----  From: Garth Bigness D  Sent: 11/01/2021  10:20 AM EST  To: Wellington Hampshire, MD, Truitt Merle, MD  Subject: FW: CT Biopsy                                   Good morning, patient has an order placed for a biopsy and per protocol patient will have to be off of Plavix for 5 days prior to procedure being done. Please advise if okay to hold, thanks Tasha.  ----- Message -----  From: Garth Bigness D  Sent: 11/01/2021  10:18 AM EST  To: Ir Procedure Requests  Subject: CT Biopsy                                       Procedure:  CT Biopsy   Reason:  Right lower lobe lung mass, confirm malignancy, RLL lung mass biopsy   History:  CT in computer, MR's are scheduled for 11/09/21   Provider:  Truitt Merle   Provider Contact:  [phone]

## 2021-11-02 ENCOUNTER — Encounter: Payer: Self-pay | Admitting: *Deleted

## 2021-11-02 ENCOUNTER — Other Ambulatory Visit: Payer: Self-pay

## 2021-11-02 ENCOUNTER — Telehealth: Payer: Self-pay | Admitting: Hematology

## 2021-11-02 ENCOUNTER — Encounter (HOSPITAL_COMMUNITY): Payer: Self-pay | Admitting: Radiology

## 2021-11-02 NOTE — Progress Notes (Signed)
Patient Name  Chon, Buhl Legal Sex  Male DOB  Oct 19, 1951 SSN  [id number] Address  [address]  [place] [address] Phone  [phone] (Home) *[phone] (Mobile)    RE: CT Biopsy Received: Meriel Pica, MD  Adron Bene, MD Dr. Burr Medico,   [place].  Hope you are doing well.   I would recommend referring this pt to Dr. Valeta Harms for consideration of bronchoscopic Bx.   If he thinks the pt is a poor candidate for bronch, I would then go ahead and get a PET CT to see if there is anything outside the chest to target or to help [place] plan our CT guided chest Bx.   Cathren Harsh        Previous Messages   ----- Message -----  From: Garth Bigness D  Sent: 11/01/2021  10:18 AM EST  To: Ir Procedure Requests  Subject: CT Biopsy                                       Procedure:  CT Biopsy   Reason:  Right lower lobe lung mass, confirm malignancy, RLL lung mass biopsy   History:  CT in computer, MR's are scheduled for 11/09/21   Provider:  Truitt Merle   Provider Contact:  [phone]

## 2021-11-02 NOTE — Telephone Encounter (Signed)
Patient is scheduled Thursday at 130pm- asked to come in early- but not sure if they can due to having another appointment.

## 2021-11-02 NOTE — Telephone Encounter (Signed)
Scheduled appt per 1/9 staff msg from Dr. Burr Medico. Pt is aware of appt date and time. Pt is aware to arrive 15 mins prior to appt time.

## 2021-11-02 NOTE — Progress Notes (Signed)
Oncology Nurse Navigator Documentation  Oncology Nurse Navigator Flowsheets 11/02/2021 11/01/2021  Abnormal Finding Date - 10/31/2021  Diagnosis Status - Additional Work Up  Navigator Follow Up Date: 11/10/2021 11/02/2021  Navigator Follow Up Reason: New Patient Appointment Appointment Review  Navigator Location [facility]  Referral Date to RadOnc/MedOnc - 11/01/2021  Navigator Encounter Type Appt/Treatment Plan Review Telephone  Telephone - Education;Asess Navigation Needs;Outgoing Call  Patient Visit Type - Initial  Treatment Phase Abnormal Scans Abnormal Scans  Barriers/Navigation Needs Coordination of Care/I received an update from radiology that patient case was discuss with pulmonary team and recommendations are bronch bx. He is scheduled for procedure this Friday with Dr. Valeta Harms. Dr. Burr Medico is aware.  Coordination of Care;Education  Education - Other  Interventions Coordination of Care Coordination of Care;Education;Psycho-Social Support  Acuity Level 2-Minimal Needs (1-2 Barriers Identified) Level 3-Moderate Needs (3-4 Barriers Identified)  Coordination of Care Other Appts  Education Method - Verbal  Time Spent with Patient 81 02

## 2021-11-04 ENCOUNTER — Other Ambulatory Visit: Payer: Self-pay

## 2021-11-04 ENCOUNTER — Encounter: Payer: Self-pay | Admitting: *Deleted

## 2021-11-04 ENCOUNTER — Ambulatory Visit: Payer: Medicare Other | Admitting: Pulmonary Disease

## 2021-11-04 ENCOUNTER — Encounter: Payer: Self-pay | Admitting: Pulmonary Disease

## 2021-11-04 ENCOUNTER — Other Ambulatory Visit: Payer: Self-pay | Admitting: Hematology

## 2021-11-04 ENCOUNTER — Encounter (HOSPITAL_COMMUNITY): Payer: Self-pay | Admitting: Radiology

## 2021-11-04 VITALS — BP 138/88 | HR 88 | Temp 97.8°F | Ht 68.0 in | Wt 182.8 lb

## 2021-11-04 DIAGNOSIS — M899 Disorder of bone, unspecified: Secondary | ICD-10-CM

## 2021-11-04 DIAGNOSIS — M546 Pain in thoracic spine: Secondary | ICD-10-CM | POA: Diagnosis not present

## 2021-11-04 DIAGNOSIS — R918 Other nonspecific abnormal finding of lung field: Secondary | ICD-10-CM

## 2021-11-04 DIAGNOSIS — M5459 Other low back pain: Secondary | ICD-10-CM | POA: Diagnosis not present

## 2021-11-04 MED ORDER — HYDROMORPHONE HCL 2 MG PO TABS: 2.0000 mg | ORAL_TABLET | Freq: Three times a day (TID) | ORAL | 0 refills | Status: DC | PRN

## 2021-11-04 NOTE — Progress Notes (Signed)
Synopsis: Referred in January 2023 for lung nodule/lung mass by Garner Nash, DO  Subjective:   PATIENT ID: Carlos Erickson GENDER: male DOB: 1951-05-18, MRN: [id number]  Chief Complaint  Patient presents with   Follow-up    Patient is here to talk about procedure tomorrow      71 year old gentleman, past medical history of coronary disease hyperlipidemia, hypertension, MI, CABG, sleep apnea on CPAP.  Patient was found to have a right lower lobe lesion on a CT scan that was completed on 10/31/2021.  CT revealed a 7.4 x 4.2 x 2.9 cm right lower lobe mass concerning for malignancy with central cavitation.  There was also concern for a acute compression fracture in T11 with mottled attenuation concerning for metastatic disease of the spine.  Patient was referred for evaluation for bronchoscopy.  Of note patient is also on Plavix this was stopped by his oncologist this past week in hopes for expedited patient's biopsy.   Past Medical History:  Diagnosis Date   Arthritis    Barrett's esophagus 07/04/2016   pt states he was told he does not have this.   CAD (coronary artery disease), native coronary artery 05/2014   Non-ST elevation myocardial infarction. Echocardiogram showed normal LV systolic function with mild to moderate mitral regurgitation. Cardiac catheterization showed significant two-vessel coronary artery disease including the RCA and left anterior descending artery. He underwent [facility]G at [facility].   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 Katy Fitch   CORONARY ARTERY BYPASS GRAFT  05/2014   [facility], CABG x 2   EYE SURGERY     HEMORROIDECTOMY     LUNG BIOPSY  2006   [facility]    Social History   Socioeconomic History   Marital status: Married    Spouse name: Not on file   Number of children: 2   Years of education: Not on file   Highest education level: High school graduate  Occupational History   Occupation: retired  Tobacco Use   Smoking status: Former    Types: Cigars    Quit date: 2015    Years since quitting: 8.0   Smokeless tobacco: Never  Vaping Use   Vaping Use: Never used  Substance and Sexual Activity   Alcohol use: Yes    Alcohol/week: 7.0 - 21.0 standard drinks    Types: 7 - 21 Cans of beer per week    Comment: beers 1-3 daily    Drug use: No   Sexual activity: Yes    Partners: Female  Other Topics Concern   Not on file  Social History Narrative   Lives at home with wife   Right handed   Caffeine: 1-2 cups of coffee per day   Social Determinants of Health   Financial Resource Strain: Not on file  Food Insecurity: Not on file  Transportation Needs: Not on file  Physical  Activity: Not on file  Stress: Not on file  Social Connections: Not on file  Intimate Partner Violence: Not on file     Allergies  Allergen Reactions   Plasma Protein Fraction Anaphylaxis    Whole body rash, hypotension intra-operatively during CABG, responded to benadryl and famotidine   Icosapent Ethyl     Joint pain   Morphine And Related Other (See Comments)    hallucanations     Outpatient Medications Prior to Visit  Medication Sig Dispense Refill   acetaminophen (TYLENOL) 325 MG tablet Take 2 tablets (650 mg total) by mouth every 6 (six) hours as needed for up to 30 doses for mild pain or moderate pain. 30 tablet 0   acetaminophen (TYLENOL) 500 MG tablet Take 500 mg by mouth every 6 (six) hours as needed for moderate pain or headache.     amLODipine (NORVASC) 10 MG tablet TAKE 1 TABLET BY  MOUTH  DAILY (Patient taking differently: Take 10 mg by mouth daily.) 90 tablet 3   atorvastatin (LIPITOR) 20 MG tablet TAKE 1 TABLET BY MOUTH ONCE DAILY (Patient taking differently: Take 20 mg by mouth daily.) 90 tablet 3   carvedilol (COREG) 6.25 MG tablet TAKE 1 TABLET BY MOUTH  TWICE DAILY (Patient taking differently: Take 6.25 mg by mouth 2 (two) times daily with a meal.) 180 tablet 3   clopidogrel (PLAVIX) 75 MG tablet TAKE 1 TABLET BY MOUTH ONCE DAILY (Patient taking differently: Take 75 mg by mouth daily.) 90 tablet 1   losartan (COZAAR) 100 MG tablet TAKE 1 TABLET BY MOUTH  DAILY (Patient taking differently: Take 100 mg by mouth daily.) 90 tablet 3   omeprazole (PRILOSEC) 20 MG capsule Take 1 capsule (20 mg total) by mouth 2 (two) times daily before a meal. (Patient taking differently: Take 20 mg by mouth daily.) 60 capsule 0   senna-docusate (SENOKOT-S) 8.6-50 MG tablet Take 1 tablet by mouth daily for 30 doses. 30 tablet 0   HYDROmorphone (DILAUDID) 2 MG tablet Take 1 tablet (2 mg total) by mouth every 8 (eight) hours as needed for up to 21 doses for severe pain. 30 tablet 0   Evolocumab (REPATHA SURECLICK) 811 MG/ML SOAJ Inject 1 pen into the skin every 14 (fourteen) days. (Patient not taking: Reported on 10/31/2021) 6 mL 3   predniSONE (DELTASONE) 10 MG tablet Take 10 mg by mouth See admin instructions. Take 1 tab TID for 2 days, 1 tab BID for 5 days, 1 tab daily until finished. (Patient not taking: Reported on 11/04/2021)     No facility-administered medications prior to visit.    Review of Systems  Constitutional:  Negative for chills, fever, malaise/fatigue and weight loss.  HENT:  Negative for hearing loss, sore throat and tinnitus.   Eyes:  Negative for blurred vision and double vision.  Respiratory:  Positive for cough and shortness of breath. Negative for hemoptysis, sputum production, wheezing and stridor.   Cardiovascular:  Negative for chest pain, palpitations, orthopnea, leg  swelling and PND.  Gastrointestinal:  Negative for abdominal pain, constipation, diarrhea, heartburn, nausea and vomiting.  Genitourinary:  Negative for dysuria, hematuria and urgency.  Musculoskeletal:  Positive for back pain. Negative for joint pain and myalgias.  Skin:  Negative for itching and rash.  Neurological:  Negative for dizziness, tingling, weakness and headaches.  Endo/Heme/Allergies:  Negative for environmental allergies. Does not bruise/bleed easily.  Psychiatric/Behavioral:  Negative for depression. The patient is not nervous/anxious and does not have insomnia.  All other systems reviewed and are negative.   Objective:  Physical Exam Vitals reviewed.  Constitutional:      General: He is not in acute distress.    Appearance: He is well-developed.  HENT:     Head: Normocephalic and atraumatic.  Eyes:     General: No scleral icterus.    Conjunctiva/sclera: Conjunctivae normal.     Pupils: Pupils are equal, round, and reactive to light.  Neck:     Vascular: No JVD.     Trachea: No tracheal deviation.  Cardiovascular:     Rate and Rhythm: Normal rate and regular rhythm.     Heart sounds: Normal heart sounds. No murmur heard. Pulmonary:     Effort: Pulmonary effort is normal. No tachypnea, accessory muscle usage or respiratory distress.     Breath sounds: No stridor. No wheezing, rhonchi or rales.  Abdominal:     General: There is no distension.     Palpations: Abdomen is soft.     Tenderness: There is no abdominal tenderness.  Musculoskeletal:        General: No tenderness.     Cervical back: Neck supple.     Comments: Walking with a walker  Lymphadenopathy:     Cervical: No cervical adenopathy.  Skin:    General: Skin is warm and dry.     Capillary Refill: Capillary refill takes less than 2 seconds.     Findings: No rash.  Neurological:     Mental Status: He is alert and oriented to person, place, and time.  Psychiatric:        Behavior: Behavior normal.      Vitals:   11/04/21 1347  BP: 138/88  Pulse: 88  Temp: 97.8 F (36.6 C)  TempSrc: Oral  SpO2: 95%  Weight: 182 lb 12.8 oz (82.9 kg)  Height: 5\' 8"  (1.727 m)   95% on RA BMI Readings from Last 3 Encounters:  11/04/21 27.79 kg/m  07/28/21 29.35 kg/m  07/12/21 29.06 kg/m   Wt Readings from Last 3 Encounters:  11/04/21 182 lb 12.8 oz (82.9 kg)  07/28/21 193 lb (87.5 kg)  07/12/21 191 lb 2.2 oz (86.7 kg)     CBC    Component Value Date/Time   WBC 13.5 (H) 10/31/2021 0041   RBC 5.18 10/31/2021 0041   HGB 15.6 10/31/2021 0041   HGB 16.1 11/28/2019 1349   HCT 48.2 10/31/2021 0041   HCT 45.5 11/28/2019 1349   PLT 290 10/31/2021 0041   PLT 261 11/28/2019 1349   MCV 93.1 10/31/2021 0041   MCV 90 11/28/2019 1349   MCV 92 05/11/2014 1924   MCH 30.1 10/31/2021 0041   MCHC 32.4 10/31/2021 0041   RDW 13.1 10/31/2021 0041   RDW 12.6 11/28/2019 1349   RDW 13.4 05/11/2014 1924   LYMPHSABS 2.4 11/28/2019 1349   MONOABS 0.8 07/05/2018 0802   EOSABS 0.4 11/28/2019 1349   BASOSABS 0.1 11/28/2019 1349     Chest Imaging: 10/31/2021: CT chest reveals a 7 cm right lower lobe mass concerning for primary lung malignancy with central cavitation with associated T11 vertebral height loss and metastatic osseous disease to the spine. The patient's images have been independently reviewed by me.    Pulmonary Functions Testing Results: No flowsheet data found.  FeNO:   Pathology:   Echocardiogram:   Heart Catheterization:     Assessment & Plan:     ICD-10-CM   1. Lung mass  R91.8 Procedural/ Surgical Case Request: ROBOTIC ASSISTED  NAVIGATIONAL BRONCHOSCOPY    2. Bone lesion  M89.9     3. 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  Plan: Today in the office we discussed the risk benefits alternatives of proceeding with navigational bronchoscopy and tissue sampling. Patient is agreeable to this plan. Unfortunately due to scheduling we were unable to accommodate biopsy for tomorrow. We will tentatively plan for bronchoscopy on 11/09/2021. We appreciate the PCC's help with scheduling of bronchoscopy as well as rearranging patient's MRI that is scheduled for Tuesday. Refill prescription from pain meds due to ongoing back pain likely from bony metastasis.    Current Outpatient Medications:    acetaminophen (TYLENOL) 325 MG tablet, Take 2 tablets (650 mg total) by mouth every 6 (six) hours as needed for up to 30 doses for mild pain or moderate pain., Disp: 30 tablet, Rfl: 0   acetaminophen (TYLENOL) 500 MG tablet, Take 500 mg by mouth every 6 (six) hours as needed for moderate pain or headache., Disp: , Rfl:    amLODipine (NORVASC) 10 MG tablet, TAKE 1 TABLET BY MOUTH  DAILY (Patient taking differently: Take 10 mg by mouth daily.), Disp: 90 tablet, Rfl: 3   atorvastatin (LIPITOR) 20 MG tablet, TAKE 1 TABLET BY MOUTH ONCE DAILY (Patient taking differently: Take 20 mg by mouth daily.), Disp: 90 tablet, Rfl: 3   carvedilol (COREG) 6.25 MG tablet, TAKE 1 TABLET BY MOUTH  TWICE DAILY (Patient taking differently: Take 6.25 mg by mouth 2 (two) times daily with a meal.), Disp: 180 tablet, Rfl: 3   clopidogrel (PLAVIX) 75 MG tablet, TAKE 1 TABLET BY MOUTH ONCE DAILY (Patient taking differently: Take 75 mg by mouth daily.), Disp: 90 tablet, Rfl: 1   losartan (COZAAR) 100 MG tablet, TAKE 1 TABLET BY MOUTH  DAILY (Patient taking differently: Take 100 mg by mouth daily.), Disp: 90 tablet, Rfl: 3   omeprazole (PRILOSEC) 20 MG capsule, Take 1 capsule (20 mg total) by mouth 2 (two) times daily before a meal. (Patient taking differently: Take 20 mg by mouth daily.), Disp: 60 capsule, Rfl: 0   senna-docusate (SENOKOT-S) 8.6-50 MG  tablet, Take 1 tablet by mouth daily for 30 doses., Disp: 30 tablet, Rfl: 0   Evolocumab (REPATHA SURECLICK) 811 MG/ML SOAJ, Inject 1 pen into the skin every 14 (fourteen) days. (Patient not taking: Reported on 10/31/2021), Disp: 6 mL, Rfl: 3   HYDROmorphone (DILAUDID) 2 MG tablet, Take 1 tablet (2 mg total) by mouth every 8 (eight) hours as needed for up to 21 days for severe pain., Disp: 30 tablet, Rfl: 0   predniSONE (DELTASONE) 10 MG tablet, Take 10 mg by mouth See admin instructions. Take 1 tab TID for 2 days, 1 tab BID for 5 days, 1 tab daily until finished. (Patient not taking: Reported on 11/04/2021), Disp: , Rfl:   I spent 62 minutes dedicated to the care of this patient on the date of this encounter to include pre-visit review of records, face-to-face time with the patient discussing conditions above, post visit ordering of testing, clinical documentation with the electronic health record, making appropriate referrals as documented, and communicating necessary findings to members of the patients care team.   Garner Nash, [address] [facility] 11/04/2021  2:13 PM

## 2021-11-04 NOTE — Patient Instructions (Addendum)
Thank you for visiting Dr. Valeta Harms at [facility]. Today we recommend the following:  Orders Placed This Encounter  Procedures   Procedural/ Surgical Case Request: ROBOTIC ASSISTED NAVIGATIONAL BRONCHOSCOPY   Bronchoscopy on 11/09/2021 Nothing to eat the night before Continue holding plavix   Return in about 13 days (around 11/17/2021) for w/ Eric Form, NP .    Please do your part to reduce the spread of COVID-19.

## 2021-11-04 NOTE — H&P (View-Only) (Signed)
Synopsis: Referred in January 2023 for lung nodule/lung mass by Garner Nash, DO  Subjective:   PATIENT ID: Carlos Erickson GENDER: male DOB: December 06, 1950, MRN: [id number]  Chief Complaint  Patient presents with   Follow-up    Patient is here to talk about procedure tomorrow      71 year old gentleman, past medical history of coronary disease hyperlipidemia, hypertension, MI, CABG, sleep apnea on CPAP.  Patient was found to have a right lower lobe lesion on a CT scan that was completed on 10/31/2021.  CT revealed a 7.4 x 4.2 x 2.9 cm right lower lobe mass concerning for malignancy with central cavitation.  There was also concern for a acute compression fracture in T11 with mottled attenuation concerning for metastatic disease of the spine.  Patient was referred for evaluation for bronchoscopy.  Of note patient is also on Plavix this was stopped by his oncologist this past week in hopes for expedited patient's biopsy.   Past Medical History:  Diagnosis Date   Arthritis    Barrett's esophagus 07/04/2016   pt states he was told he does not have this.   CAD (coronary artery disease), native coronary artery 05/2014   Non-ST elevation myocardial infarction. Echocardiogram showed normal LV systolic function with mild to moderate mitral regurgitation. Cardiac catheterization showed significant two-vessel coronary artery disease including the RCA and left anterior descending artery. He underwent CABG at [facility].   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 Katy Fitch   CORONARY ARTERY BYPASS GRAFT  05/2014   [facility], CABG x 2   EYE SURGERY     HEMORROIDECTOMY     LUNG BIOPSY  2006   [facility]    Social History   Socioeconomic History   Marital status: Married    Spouse name: Not on file   Number of children: 2   Years of education: Not on file   Highest education level: High school graduate  Occupational History   Occupation: retired  Tobacco Use   Smoking status: Former    Types: Cigars    Quit date: 2015    Years since quitting: 8.0   Smokeless tobacco: Never  Vaping Use   Vaping Use: Never used  Substance and Sexual Activity   Alcohol use: Yes    Alcohol/week: 7.0 - 21.0 standard drinks    Types: 7 - 21 Cans of beer per week    Comment: beers 1-3 daily    Drug use: No   Sexual activity: Yes    Partners: Female  Other Topics Concern   Not on file  Social History Narrative   Lives at home with wife   Right handed   Caffeine: 1-2 cups of coffee per day   Social Determinants of Health   Financial Resource Strain: Not on file  Food Insecurity: Not on file  Transportation Needs: Not on file  Physical  Activity: Not on file  Stress: Not on file  Social Connections: Not on file  Intimate Partner Violence: Not on file     Allergies  Allergen Reactions   Plasma Protein Fraction Anaphylaxis    Whole body rash, hypotension intra-operatively during CABG, responded to benadryl and famotidine   Icosapent Ethyl     Joint pain   Morphine And Related Other (See Comments)    hallucanations     Outpatient Medications Prior to Visit  Medication Sig Dispense Refill   acetaminophen (TYLENOL) 325 MG tablet Take 2 tablets (650 mg total) by mouth every 6 (six) hours as needed for up to 30 doses for mild pain or moderate pain. 30 tablet 0   acetaminophen (TYLENOL) 500 MG tablet Take 500 mg by mouth every 6 (six) hours as needed for moderate pain or headache.     amLODipine (NORVASC) 10 MG tablet TAKE 1 TABLET BY  MOUTH  DAILY (Patient taking differently: Take 10 mg by mouth daily.) 90 tablet 3   atorvastatin (LIPITOR) 20 MG tablet TAKE 1 TABLET BY MOUTH ONCE DAILY (Patient taking differently: Take 20 mg by mouth daily.) 90 tablet 3   carvedilol (COREG) 6.25 MG tablet TAKE 1 TABLET BY MOUTH  TWICE DAILY (Patient taking differently: Take 6.25 mg by mouth 2 (two) times daily with a meal.) 180 tablet 3   clopidogrel (PLAVIX) 75 MG tablet TAKE 1 TABLET BY MOUTH ONCE DAILY (Patient taking differently: Take 75 mg by mouth daily.) 90 tablet 1   losartan (COZAAR) 100 MG tablet TAKE 1 TABLET BY MOUTH  DAILY (Patient taking differently: Take 100 mg by mouth daily.) 90 tablet 3   omeprazole (PRILOSEC) 20 MG capsule Take 1 capsule (20 mg total) by mouth 2 (two) times daily before a meal. (Patient taking differently: Take 20 mg by mouth daily.) 60 capsule 0   senna-docusate (SENOKOT-S) 8.6-50 MG tablet Take 1 tablet by mouth daily for 30 doses. 30 tablet 0   HYDROmorphone (DILAUDID) 2 MG tablet Take 1 tablet (2 mg total) by mouth every 8 (eight) hours as needed for up to 21 doses for severe pain. 30 tablet 0   Evolocumab (REPATHA SURECLICK) 035 MG/ML SOAJ Inject 1 pen into the skin every 14 (fourteen) days. (Patient not taking: Reported on 10/31/2021) 6 mL 3   predniSONE (DELTASONE) 10 MG tablet Take 10 mg by mouth See admin instructions. Take 1 tab TID for 2 days, 1 tab BID for 5 days, 1 tab daily until finished. (Patient not taking: Reported on 11/04/2021)     No facility-administered medications prior to visit.    Review of Systems  Constitutional:  Negative for chills, fever, malaise/fatigue and weight loss.  HENT:  Negative for hearing loss, sore throat and tinnitus.   Eyes:  Negative for blurred vision and double vision.  Respiratory:  Positive for cough and shortness of breath. Negative for hemoptysis, sputum production, wheezing and stridor.   Cardiovascular:  Negative for chest pain, palpitations, orthopnea, leg  swelling and PND.  Gastrointestinal:  Negative for abdominal pain, constipation, diarrhea, heartburn, nausea and vomiting.  Genitourinary:  Negative for dysuria, hematuria and urgency.  Musculoskeletal:  Positive for back pain. Negative for joint pain and myalgias.  Skin:  Negative for itching and rash.  Neurological:  Negative for dizziness, tingling, weakness and headaches.  Endo/Heme/Allergies:  Negative for environmental allergies. Does not bruise/bleed easily.  Psychiatric/Behavioral:  Negative for depression. The patient is not nervous/anxious and does not have insomnia.  All other systems reviewed and are negative.   Objective:  Physical Exam Vitals reviewed.  Constitutional:      General: He is not in acute distress.    Appearance: He is well-developed.  HENT:     Head: Normocephalic and atraumatic.  Eyes:     General: No scleral icterus.    Conjunctiva/sclera: Conjunctivae normal.     Pupils: Pupils are equal, round, and reactive to light.  Neck:     Vascular: No JVD.     Trachea: No tracheal deviation.  Cardiovascular:     Rate and Rhythm: Normal rate and regular rhythm.     Heart sounds: Normal heart sounds. No murmur heard. Pulmonary:     Effort: Pulmonary effort is normal. No tachypnea, accessory muscle usage or respiratory distress.     Breath sounds: No stridor. No wheezing, rhonchi or rales.  Abdominal:     General: There is no distension.     Palpations: Abdomen is soft.     Tenderness: There is no abdominal tenderness.  Musculoskeletal:        General: No tenderness.     Cervical back: Neck supple.     Comments: Walking with a walker  Lymphadenopathy:     Cervical: No cervical adenopathy.  Skin:    General: Skin is warm and dry.     Capillary Refill: Capillary refill takes less than 2 seconds.     Findings: No rash.  Neurological:     Mental Status: He is alert and oriented to person, place, and time.  Psychiatric:        Behavior: Behavior normal.      Vitals:   11/04/21 1347  BP: 138/88  Pulse: 88  Temp: 97.8 F (36.6 C)  TempSrc: Oral  SpO2: 95%  Weight: 182 lb 12.8 oz (82.9 kg)  Height: 5\' 8"  (1.727 m)   95% on RA BMI Readings from Last 3 Encounters:  11/04/21 27.79 kg/m  07/28/21 29.35 kg/m  07/12/21 29.06 kg/m   Wt Readings from Last 3 Encounters:  11/04/21 182 lb 12.8 oz (82.9 kg)  07/28/21 193 lb (87.5 kg)  07/12/21 191 lb 2.2 oz (86.7 kg)     CBC    Component Value Date/Time   WBC 13.5 (H) 10/31/2021 0041   RBC 5.18 10/31/2021 0041   HGB 15.6 10/31/2021 0041   HGB 16.1 11/28/2019 1349   HCT 48.2 10/31/2021 0041   HCT 45.5 11/28/2019 1349   PLT 290 10/31/2021 0041   PLT 261 11/28/2019 1349   MCV 93.1 10/31/2021 0041   MCV 90 11/28/2019 1349   MCV 92 05/11/2014 1924   MCH 30.1 10/31/2021 0041   MCHC 32.4 10/31/2021 0041   RDW 13.1 10/31/2021 0041   RDW 12.6 11/28/2019 1349   RDW 13.4 05/11/2014 1924   LYMPHSABS 2.4 11/28/2019 1349   MONOABS 0.8 07/05/2018 0802   EOSABS 0.4 11/28/2019 1349   BASOSABS 0.1 11/28/2019 1349     Chest Imaging: 10/31/2021: CT chest reveals a 7 cm right lower lobe mass concerning for primary lung malignancy with central cavitation with associated T11 vertebral height loss and metastatic osseous disease to the spine. The patient's images have been independently reviewed by me.    Pulmonary Functions Testing Results: No flowsheet data found.  FeNO:   Pathology:   Echocardiogram:   Heart Catheterization:     Assessment & Plan:     ICD-10-CM   1. Lung mass  R91.8 Procedural/ Surgical Case Request: ROBOTIC ASSISTED  NAVIGATIONAL BRONCHOSCOPY    2. Bone lesion  M89.9     3. 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  Plan: Today in the office we discussed the risk benefits alternatives of proceeding with navigational bronchoscopy and tissue sampling. Patient is agreeable to this plan. Unfortunately due to scheduling we were unable to accommodate biopsy for tomorrow. We will tentatively plan for bronchoscopy on 11/09/2021. We appreciate the PCC's help with scheduling of bronchoscopy as well as rearranging patient's MRI that is scheduled for Tuesday. Refill prescription from pain meds due to ongoing back pain likely from bony metastasis.    Current Outpatient Medications:    acetaminophen (TYLENOL) 325 MG tablet, Take 2 tablets (650 mg total) by mouth every 6 (six) hours as needed for up to 30 doses for mild pain or moderate pain., Disp: 30 tablet, Rfl: 0   acetaminophen (TYLENOL) 500 MG tablet, Take 500 mg by mouth every 6 (six) hours as needed for moderate pain or headache., Disp: , Rfl:    amLODipine (NORVASC) 10 MG tablet, TAKE 1 TABLET BY MOUTH  DAILY (Patient taking differently: Take 10 mg by mouth daily.), Disp: 90 tablet, Rfl: 3   atorvastatin (LIPITOR) 20 MG tablet, TAKE 1 TABLET BY MOUTH ONCE DAILY (Patient taking differently: Take 20 mg by mouth daily.), Disp: 90 tablet, Rfl: 3   carvedilol (COREG) 6.25 MG tablet, TAKE 1 TABLET BY MOUTH  TWICE DAILY (Patient taking differently: Take 6.25 mg by mouth 2 (two) times daily with a meal.), Disp: 180 tablet, Rfl: 3   clopidogrel (PLAVIX) 75 MG tablet, TAKE 1 TABLET BY MOUTH ONCE DAILY (Patient taking differently: Take 75 mg by mouth daily.), Disp: 90 tablet, Rfl: 1   losartan (COZAAR) 100 MG tablet, TAKE 1 TABLET BY MOUTH  DAILY (Patient taking differently: Take 100 mg by mouth daily.), Disp: 90 tablet, Rfl: 3   omeprazole (PRILOSEC) 20 MG capsule, Take 1 capsule (20 mg total) by mouth 2 (two) times daily before a meal. (Patient taking differently: Take 20 mg by mouth daily.), Disp: 60 capsule, Rfl: 0   senna-docusate (SENOKOT-S) 8.6-50 MG  tablet, Take 1 tablet by mouth daily for 30 doses., Disp: 30 tablet, Rfl: 0   Evolocumab (REPATHA SURECLICK) 850 MG/ML SOAJ, Inject 1 pen into the skin every 14 (fourteen) days. (Patient not taking: Reported on 10/31/2021), Disp: 6 mL, Rfl: 3   HYDROmorphone (DILAUDID) 2 MG tablet, Take 1 tablet (2 mg total) by mouth every 8 (eight) hours as needed for up to 21 days for severe pain., Disp: 30 tablet, Rfl: 0   predniSONE (DELTASONE) 10 MG tablet, Take 10 mg by mouth See admin instructions. Take 1 tab TID for 2 days, 1 tab BID for 5 days, 1 tab daily until finished. (Patient not taking: Reported on 11/04/2021), Disp: , Rfl:   I spent 62 minutes dedicated to the care of this patient on the date of this encounter to include pre-visit review of records, face-to-face time with the patient discussing conditions above, post visit ordering of testing, clinical documentation with the electronic health record, making appropriate referrals as documented, and communicating necessary findings to members of the patients care team.   Garner Nash, [facility] 11/04/2021  2:13 PM

## 2021-11-04 NOTE — Progress Notes (Signed)
Patient Name  Carlos Erickson, [name] Legal Sex  Male DOB  01/07/1951 SSN  [id number] Address  [address]  [place] [address] Phone  [phone] (Home) *Preferred843 [phone] (Mobile)    RE: CT Biopsy Received: Today Wellington Hampshire, MD  Truitt Merle, MD; Felicity Pellegrini to hold Plavix from my standpoint as well.        Previous Messages   ----- Message -----  From: Truitt Merle, MD  Sent: 11/01/2021  12:30 PM EST  To: Laural Benes, MD  Subject: RE: CT Biopsy                                   Yes, OK to hold Plavix. I told him yesterday, he has been office since yesterday 1/8.   Krista Blue  ----- Message -----  From: Garth Bigness D  Sent: 11/01/2021  10:20 AM EST  To: Wellington Hampshire, MD, Truitt Merle, MD  Subject: FW: CT Biopsy                                   Good morning, patient has an order placed for a biopsy and per protocol patient will have to be off of Plavix for 5 days prior to procedure being done. Please advise if okay to hold, thanks Tasha.  ----- Message -----  From: Garth Bigness D  Sent: 11/01/2021  10:18 AM EST  To: Ir Procedure Requests  Subject: CT Biopsy                                       Procedure:  CT Biopsy   Reason:  Right lower lobe lung mass, confirm malignancy, RLL lung mass biopsy   History:  CT in computer, MR's are scheduled for 11/09/21   Provider:  Truitt Merle   Provider Contact:  [phone]

## 2021-11-04 NOTE — Progress Notes (Signed)
Oncology Nurse Navigator Documentation  Oncology Nurse Navigator Flowsheets 11/04/2021 11/02/2021 11/01/2021  Abnormal Finding Date - - 10/31/2021  Diagnosis Status - - Additional Work Up  Navigator Follow Up Date: - 11/10/2021 11/02/2021  Navigator Follow Up Reason: - New Patient Appointment Appointment Review  Navigator Location [place]  Referral Date to RadOnc/MedOnc - - 11/01/2021  Navigator Encounter Type Appt/Treatment Plan Review Appt/Treatment Plan Review Telephone  Telephone - - Education;Asess Navigation Needs;Outgoing Call  Patient Visit Type - - Initial  Treatment Phase Abnormal Scans Abnormal Scans Abnormal Scans  Barriers/Navigation Needs Coordination of Care Coordination of Care Coordination of Care;Education  Education - - Other  Interventions Coordination of Care/I followed up on patient schedule. His plan of care is scheduled at this time.  Coordination of Care Coordination of Care;Education;Psycho-Social Support  Acuity Level 2-Minimal Needs (1-2 Barriers Identified) Level 2-Minimal Needs (1-2 Barriers Identified) Level 3-Moderate Needs (3-4 Barriers Identified)  Coordination of Care Other Other Appts  Education Method - - Verbal  Time Spent with Patient [phone]

## 2021-11-05 ENCOUNTER — Ambulatory Visit (HOSPITAL_COMMUNITY): Admission: RE | Admit: 2021-11-05 | Payer: Medicare Other | Source: Home / Self Care | Admitting: Pulmonary Disease

## 2021-11-05 ENCOUNTER — Encounter (HOSPITAL_COMMUNITY): Admission: RE | Payer: Self-pay | Source: Home / Self Care

## 2021-11-05 ENCOUNTER — Other Ambulatory Visit: Payer: Self-pay | Admitting: Pulmonary Disease

## 2021-11-05 DIAGNOSIS — R918 Other nonspecific abnormal finding of lung field: Secondary | ICD-10-CM | POA: Insufficient documentation

## 2021-11-05 LAB — SARS CORONAVIRUS 2 (TAT 6-24 HRS): SARS Coronavirus 2: NEGATIVE

## 2021-11-05 SURGERY — BRONCHOSCOPY, WITH BIOPSY USING ELECTROMAGNETIC NAVIGATION
Anesthesia: General

## 2021-11-08 NOTE — Progress Notes (Signed)
Anesthesia Chart Review: Same-day work-up  Follows with cardiology for history of CAD with non-STEMI 2015 s/p two-vessel CABG 2015 (echo at the time of CABG showed normal LV systolic function with mild to moderate mitral regurgitation), hypertension, hyperlipidemia, OSA intolerant to CPAP. Last seen by Marrianne Mood PA-C 07/28/21. Per note, "No symptoms concerning for angina at rest or with exertion.  Given his pulmonary symptoms above, encourage pulmonary follow-up/PFTs with patient understanding and agreement.  Continue Plavix, atorvastatin, carvedilol, losartan.  No ASA due to allergy.  As below, will start PCSK9 inhibitors today for LDL control, given history of statin intolerance/Zetia intolerance in the past.  No indication for ischemic work-up at this time.  Aggressive risk factor modification reviewed in detail, including LDL control and increased activity/heart healthy diet with decreased alcohol use." 6-12 month followup recommended.   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  Per Dr. Juline Patch note 11/04/2021, patient's Plavix was stopped last week in hopes of expediting patient biopsy.  BMP and CBC from 10/31/2021 reviewed, unremarkable.  EKG 10/31/2021: NSR.  Rate 88.  CT chest 10/31/2021: IMPRESSION: 1. There is a thick-walled subpleural cavitary lesion of the dependent right lower lobe measuring 6.9 cm, highly concerning for primary lung malignancy. Cavitary infection is however a general differential consideration. 2. Wedge deformity of T11 and superior endplate deformity of U57 with subtle underlying lytic character, highly concerning for osseous metastatic disease and pathologic fracture. 3. Additional small lucent focus at the left aspect of T9, suspicious for an additional metastatic lesion. 4. No evidence of lymphadenopathy in the chest. 5. Emphysema. 6. Coronary  artery disease.  Exercise tolerance test 07/14/2016: Normal treadmill stress test with no evidence of ischemia. Good exercise capacity with hypertensive response to exercise. No hypoxia with exercise.    Wynonia Musty [facility] Short Stay Center/Anesthesiology Phone [phone] 11/08/2021 3:22 PM

## 2021-11-08 NOTE — Anesthesia Preprocedure Evaluation (Addendum)
Anesthesia Evaluation  Patient identified by MRN, date of birth, ID band Patient awake    Reviewed: Allergy & Precautions, NPO status , Patient's Chart, lab work & pertinent test results  Airway Mallampati: II  TM Distance: >3 FB Neck ROM: Full    Dental  (+) Dental Advisory Given   Pulmonary sleep apnea , Patient abstained from smoking., former smoker,  Lung mass   breath sounds clear to auscultation       Cardiovascular hypertension, Pt. on medications and Pt. on home beta blockers + CAD, + Past MI, + Cardiac Stents and + CABG   Rhythm:Regular Rate:Normal     Neuro/Psych  Neuromuscular disease    GI/Hepatic Neg liver ROS, GERD  ,  Endo/Other  negative endocrine ROS  Renal/GU negative Renal ROS     Musculoskeletal  (+) Arthritis ,   Abdominal   Peds  Hematology negative hematology ROS (+)   Anesthesia Other Findings   Reproductive/Obstetrics                           Anesthesia Physical Anesthesia Plan  ASA: 3  Anesthesia Plan: General   Post-op Pain Management: Minimal or no pain anticipated and Tylenol PO (pre-op)   Induction: Intravenous  PONV Risk Score and Plan: 2 and Dexamethasone, Ondansetron and Treatment may vary due to age or medical condition  Airway Management Planned: Oral ETT  Additional Equipment: None  Intra-op Plan:   Post-operative Plan: Extubation in OR  Informed Consent: I have reviewed the patients History and Physical, chart, labs and discussed the procedure including the risks, benefits and alternatives for the proposed anesthesia with the patient or authorized representative who has indicated his/her understanding and acceptance.     Dental advisory given  Plan Discussed with:   Anesthesia Plan Comments: (PAT note by Karoline Caldwell, PA-C: Follows with cardiology for history of CAD with non-STEMI 2015 s/p two-vessel CABG 2015 (echo at the time of CABG  showed normal LV systolic function with mild to moderate mitral regurgitation), hypertension, hyperlipidemia, OSA intolerant to CPAP. Last seen by Marrianne Mood PA-C 07/28/21. Per note, "No symptoms concerning for angina at rest or with exertion. Given his pulmonary symptoms above, encourage pulmonary follow-up/PFTs with patient understanding and agreement. Continue Plavix, atorvastatin, carvedilol, losartan. No ASA due to allergy. As below, will start PCSK9 inhibitors today for LDL control, given history of statin intolerance/Zetia intolerance in the past. No indication for ischemic work-up at this time. Aggressive risk factor modification reviewed in detail, including LDL control and increased activity/heart healthy diet with decreased alcohol use." 6-12 month followup recommended.   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  Per Dr. Juline Patch note 11/04/2021, patient's Plavix was stopped last week in hopes of expediting patient biopsy.  BMP and CBC from 10/31/2021 reviewed, unremarkable.  EKG 10/31/2021: NSR.  Rate 88.  CT chest 10/31/2021: IMPRESSION: 1. There is a thick-walled subpleural cavitary lesion of the dependent right lower lobe measuring 6.9 cm, highly concerning for primary lung malignancy. Cavitary infection is however a general differential consideration. 2. Wedge deformity of T11 and superior endplate deformity of Q46 with subtle underlying lytic character, highly concerning for osseous metastatic disease and pathologic fracture. 3. Additional small lucent focus at the left aspect of T9, suspicious for an additional metastatic lesion. 4. No evidence of lymphadenopathy in the chest. 5. Emphysema. 6.  Coronary artery disease.  Exercise tolerance test 07/14/2016: Normal treadmill stress test with no evidence of ischemia. Good exercise capacity with hypertensive  response to exercise. No hypoxia with exercise. )       Anesthesia Quick Evaluation

## 2021-11-09 ENCOUNTER — Encounter (HOSPITAL_COMMUNITY): Payer: Self-pay | Admitting: Pulmonary Disease

## 2021-11-09 ENCOUNTER — Encounter (HOSPITAL_COMMUNITY)
Admission: RE | Disposition: A | Discharge status: Home / Self Care | Payer: Self-pay | Source: Home / Self Care | Attending: Pulmonary Disease

## 2021-11-09 ENCOUNTER — Ambulatory Visit (HOSPITAL_COMMUNITY): Payer: Medicare Other

## 2021-11-09 ENCOUNTER — Other Ambulatory Visit (HOSPITAL_COMMUNITY): Payer: Medicare Other

## 2021-11-09 ENCOUNTER — Ambulatory Visit (HOSPITAL_COMMUNITY)
Admission: RE | Admit: 2021-11-09 | Discharge: 2021-11-09 | Disposition: A | Discharge status: Home / Self Care | Payer: Medicare Other | Attending: Pulmonary Disease | Admitting: Pulmonary Disease

## 2021-11-09 ENCOUNTER — Ambulatory Visit (HOSPITAL_COMMUNITY): Payer: Medicare Other | Admitting: Physician Assistant

## 2021-11-09 DIAGNOSIS — C3431 Malignant neoplasm of lower lobe, right bronchus or lung: Secondary | ICD-10-CM | POA: Diagnosis present

## 2021-11-09 DIAGNOSIS — E785 Hyperlipidemia, unspecified: Secondary | ICD-10-CM | POA: Insufficient documentation

## 2021-11-09 DIAGNOSIS — Z7902 Long term (current) use of antithrombotics/antiplatelets: Secondary | ICD-10-CM | POA: Diagnosis not present

## 2021-11-09 DIAGNOSIS — Z951 Presence of aortocoronary bypass graft: Secondary | ICD-10-CM | POA: Insufficient documentation

## 2021-11-09 DIAGNOSIS — Z79899 Other long term (current) drug therapy: Secondary | ICD-10-CM | POA: Diagnosis not present

## 2021-11-09 DIAGNOSIS — I2510 Atherosclerotic heart disease of native coronary artery without angina pectoris: Secondary | ICD-10-CM | POA: Insufficient documentation

## 2021-11-09 DIAGNOSIS — Z955 Presence of coronary angioplasty implant and graft: Secondary | ICD-10-CM | POA: Insufficient documentation

## 2021-11-09 DIAGNOSIS — I252 Old myocardial infarction: Secondary | ICD-10-CM | POA: Diagnosis not present

## 2021-11-09 DIAGNOSIS — I10 Essential (primary) hypertension: Secondary | ICD-10-CM | POA: Diagnosis not present

## 2021-11-09 DIAGNOSIS — K219 Gastro-esophageal reflux disease without esophagitis: Secondary | ICD-10-CM | POA: Insufficient documentation

## 2021-11-09 DIAGNOSIS — Z87891 Personal history of nicotine dependence: Secondary | ICD-10-CM | POA: Diagnosis not present

## 2021-11-09 DIAGNOSIS — G4730 Sleep apnea, unspecified: Secondary | ICD-10-CM | POA: Insufficient documentation

## 2021-11-09 DIAGNOSIS — C3491 Malignant neoplasm of unspecified part of right bronchus or lung: Secondary | ICD-10-CM | POA: Diagnosis not present

## 2021-11-09 DIAGNOSIS — I517 Cardiomegaly: Secondary | ICD-10-CM | POA: Diagnosis not present

## 2021-11-09 DIAGNOSIS — R918 Other nonspecific abnormal finding of lung field: Secondary | ICD-10-CM

## 2021-11-09 DIAGNOSIS — Z9889 Other specified postprocedural states: Secondary | ICD-10-CM

## 2021-11-09 DIAGNOSIS — C771 Secondary and unspecified malignant neoplasm of intrathoracic lymph nodes: Secondary | ICD-10-CM | POA: Diagnosis not present

## 2021-11-09 DIAGNOSIS — Z419 Encounter for procedure for purposes other than remedying health state, unspecified: Secondary | ICD-10-CM

## 2021-11-09 HISTORY — PX: VIDEO BRONCHOSCOPY WITH ENDOBRONCHIAL ULTRASOUND: SHX6177

## 2021-11-09 HISTORY — PX: BRONCHIAL BRUSHINGS: SHX5108

## 2021-11-09 HISTORY — PX: BRONCHIAL BIOPSY: SHX5109

## 2021-11-09 HISTORY — PX: BRONCHIAL NEEDLE ASPIRATION BIOPSY: SHX5106

## 2021-11-09 LAB — CBC
HCT: 44.2 % (ref 39.0–52.0)
Hemoglobin: 14.7 g/dL (ref 13.0–17.0)
MCH: 30.1 pg (ref 26.0–34.0)
MCHC: 33.3 g/dL (ref 30.0–36.0)
MCV: 90.6 fL (ref 80.0–100.0)
Platelets: 304 10*3/uL (ref 150–400)
RBC: 4.88 MIL/uL (ref 4.22–5.81)
RDW: 12.3 % (ref 11.5–15.5)
WBC: 9.4 10*3/uL (ref 4.0–10.5)
nRBC: 0 % (ref 0.0–0.2)

## 2021-11-09 LAB — BASIC METABOLIC PANEL
Anion gap: 11 (ref 5–15)
BUN: 14 mg/dL (ref 8–23)
CO2: 25 mmol/L (ref 22–32)
Calcium: 9.1 mg/dL (ref 8.9–10.3)
Chloride: 97 mmol/L — ABNORMAL LOW (ref 98–111)
Creatinine, Ser: 0.9 mg/dL (ref 0.61–1.24)
GFR, Estimated: >60 mL/min (ref >60)
Glucose, Bld: 109 mg/dL — ABNORMAL HIGH (ref 70–99)
Potassium: 4.9 mmol/L (ref 3.5–5.1)
Sodium: 133 mmol/L — ABNORMAL LOW (ref 135–145)

## 2021-11-09 IMAGING — DX DG CHEST 1V PORT
1 series · 1 of 1 positions shown · non-contrast
Comparison: [DATE]

CLINICAL DATA: Status post bronchoscopy and biopsy

EXAM:
PORTABLE CHEST 1 VIEW

[chest]
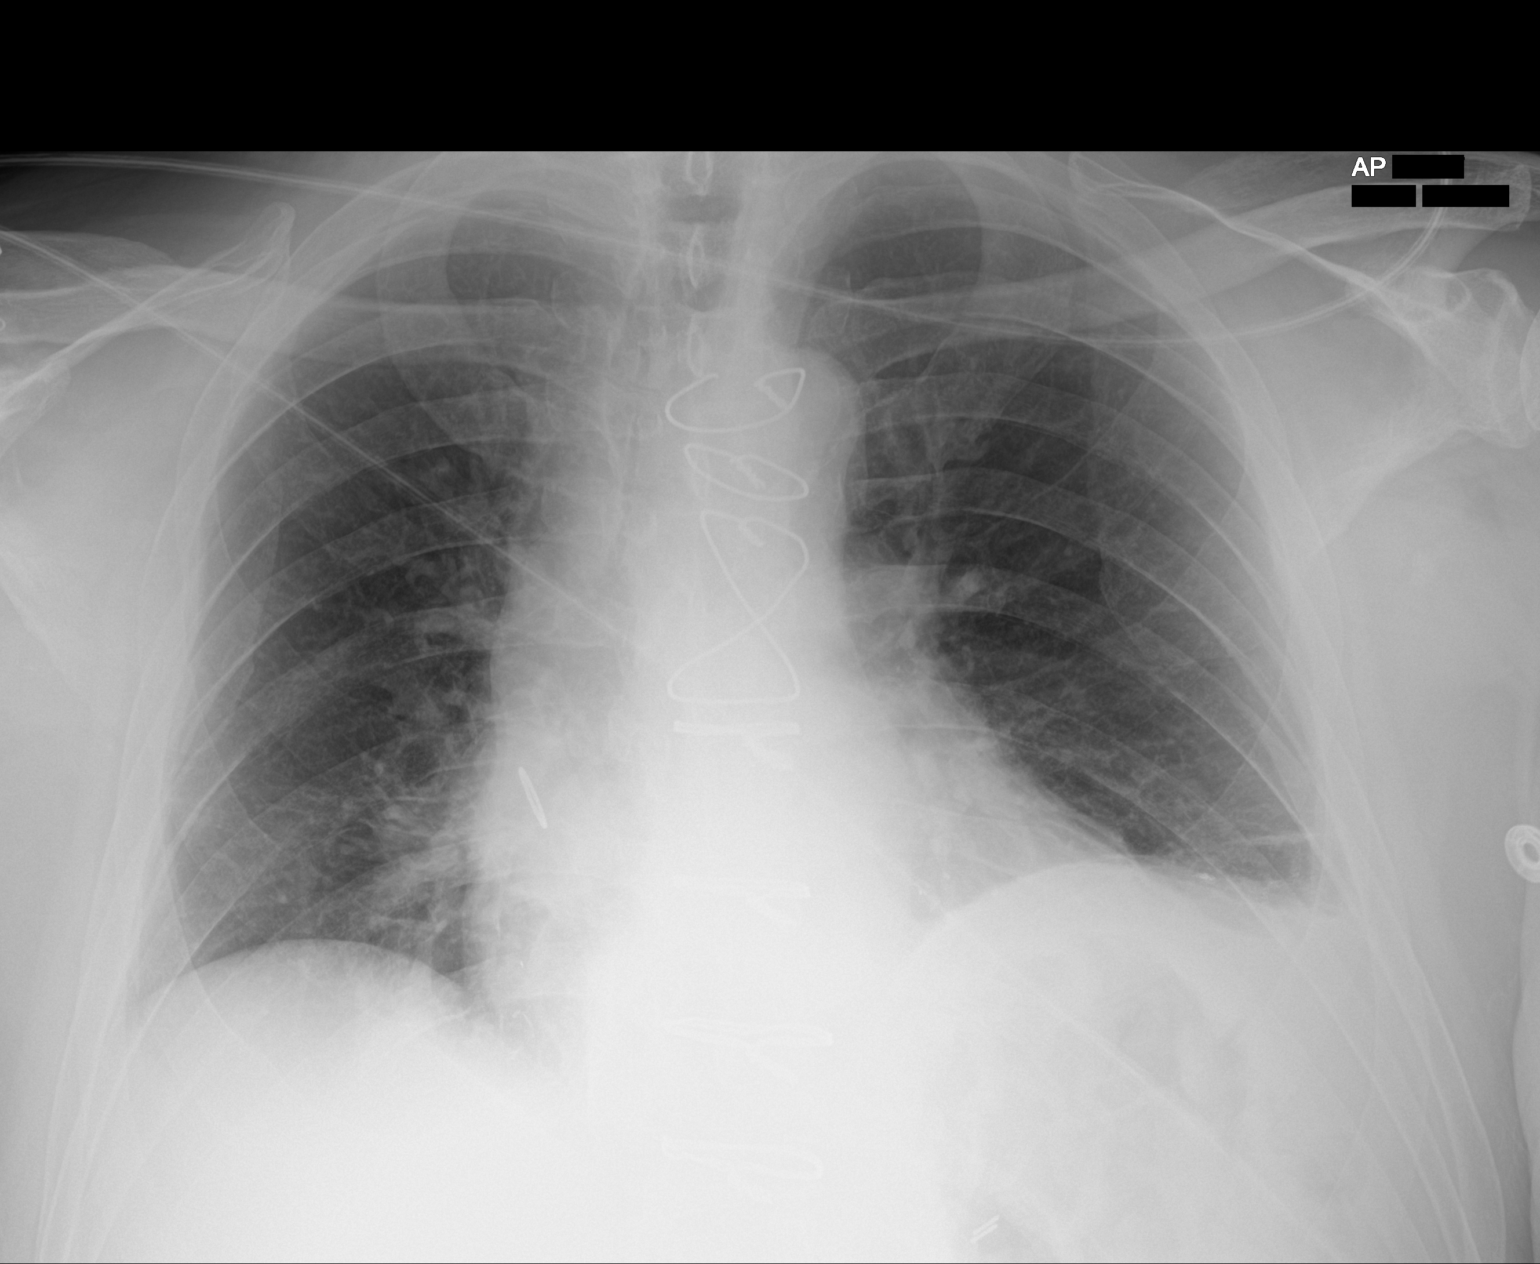

[1 of 1 positions shown; findings below may reference images not displayed]

FINDINGS: Cardiomegaly status post median sternotomy. Cavitary lesion of the
posterior right lower lobe is poorly appreciated on frontal chest
radiograph. No pneumothorax. The visualized skeletal structures are
unremarkable.
IMPRESSION: 1. Cardiomegaly status post median sternotomy.
2. Cavitary lesion of the posterior right lower lobe is poorly
appreciated on frontal chest radiograph. No pneumothorax following
bronchoscopic biopsy.

## 2021-11-09 SURGERY — BRONCHOSCOPY, WITH BIOPSY USING ELECTROMAGNETIC NAVIGATION
Anesthesia: General

## 2021-11-09 MED ORDER — PROPOFOL 10 MG/ML IV BOLUS
INTRAVENOUS | Status: DC | PRN
Start: 2021-11-09 — End: 2021-11-09
  Administered 2021-11-09: 150 mg via INTRAVENOUS

## 2021-11-09 MED ORDER — LIDOCAINE 2% (20 MG/ML) 5 ML SYRINGE
INTRAMUSCULAR | Status: DC | PRN
  Administered 2021-11-09: 60 mg via INTRAVENOUS

## 2021-11-09 MED ORDER — ONDANSETRON HCL 4 MG/2ML IJ SOLN
INTRAMUSCULAR | Status: DC | PRN
  Administered 2021-11-09: 4 mg via INTRAVENOUS

## 2021-11-09 MED ORDER — CHLORHEXIDINE GLUCONATE 0.12 % MT SOLN
OROMUCOSAL | Status: AC
  Administered 2021-11-09: 15 mL
  Filled 2021-11-09: qty 15

## 2021-11-09 MED ORDER — LACTATED RINGERS IV SOLN: INTRAVENOUS | Status: DC

## 2021-11-09 MED ORDER — FENTANYL CITRATE (PF) 250 MCG/5ML IJ SOLN
INTRAMUSCULAR | Status: DC | PRN
  Administered 2021-11-09 (×2): 50 ug via INTRAVENOUS

## 2021-11-09 MED ORDER — FENTANYL CITRATE (PF) 100 MCG/2ML IJ SOLN: 25.0000 ug | INTRAMUSCULAR | Status: DC | PRN

## 2021-11-09 MED ORDER — ACETAMINOPHEN 500 MG PO TABS
1000.0000 mg | ORAL_TABLET | Freq: Once | ORAL | Status: AC
  Administered 2021-11-09: 1000 mg via ORAL
  Filled 2021-11-09: qty 2

## 2021-11-09 MED ORDER — SUGAMMADEX SODIUM 200 MG/2ML IV SOLN
INTRAVENOUS | Status: DC | PRN
  Administered 2021-11-09: 200 mg via INTRAVENOUS

## 2021-11-09 MED ORDER — PHENYLEPHRINE HCL-NACL 20-0.9 MG/250ML-% IV SOLN
INTRAVENOUS | Status: DC | PRN
  Administered 2021-11-09: 40 ug/min via INTRAVENOUS

## 2021-11-09 MED ORDER — DEXAMETHASONE SODIUM PHOSPHATE 10 MG/ML IJ SOLN
INTRAMUSCULAR | Status: DC | PRN
Start: 2021-11-09 — End: 2021-11-09
  Administered 2021-11-09: 10 mg via INTRAVENOUS

## 2021-11-09 MED ORDER — ROCURONIUM BROMIDE 10 MG/ML (PF) SYRINGE
PREFILLED_SYRINGE | INTRAVENOUS | Status: DC | PRN
Start: 2021-11-09 — End: 2021-11-09
  Administered 2021-11-09: 50 mg via INTRAVENOUS

## 2021-11-09 MED ORDER — AMISULPRIDE (ANTIEMETIC) 5 MG/2ML IV SOLN
10.0000 mg | Freq: Once | INTRAVENOUS | Status: DC | PRN
  Filled 2021-11-09: qty 4

## 2021-11-09 NOTE — Op Note (Signed)
Video Bronchoscopy with Robotic Assisted Bronchoscopic Navigation  Video Bronchoscopy with Endobronchial Ultrasound Procedure Note  Date of Operation: 11/09/2021   Pre-op Diagnosis: Lung mass   Post-op Diagnosis: Lung mass  Surgeon: Garner Nash, DO   Assistants: None   Anesthesia: General endotracheal anesthesia  Operation: Flexible video fiberoptic bronchoscopy with robotic assistance and biopsies.  Estimated Blood Loss: Minimal  Complications: None  Indications and History: Carlos Erickson is a 71 y.o. male with history of lung mass. The risks, benefits, complications, treatment options and expected outcomes were discussed with the patient.  The possibilities of pneumothorax, pneumonia, reaction to medication, pulmonary aspiration, perforation of a viscus, bleeding, failure to diagnose a condition and creating a complication requiring transfusion or operation were discussed with the patient who freely signed the consent.    Description of Procedure: The patient was seen in the Preoperative Area, was examined and was deemed appropriate to proceed.  The patient was taken to [facility] endoscopy room 3, identified as Donzetta Kohut and the procedure verified as Flexible Video Fiberoptic Bronchoscopy.  A Time Out was held and the above information confirmed.   Prior to the date of the procedure a high-resolution CT scan of the chest was performed. Utilizing ION software program a virtual tracheobronchial tree was generated to allow the creation of distinct navigation pathways to the patient's parenchymal abnormalities. After being taken to the operating room general anesthesia was initiated and the patient  was orally intubated. The video fiberoptic bronchoscope was introduced via the endotracheal tube and a general inspection was performed which showed normal right and left lung anatomy, aspiration of the bilateral mainstems was completed to remove any remaining secretions. Robotic catheter  inserted into patient's endotracheal tube.   Target #1 RLL: The distinct navigation pathways prepared prior to this procedure were then utilized to navigate to patient's lesion identified on CT scan. The robotic catheter was secured into place and the vision probe was withdrawn.  Lesion location was approximated using fluoroscopy and radial endobronchial ultrasound for peripheral targeting. Under fluoroscopic guidance transbronchial needle brushings, transbronchial needle biopsies, and transbronchial forceps biopsies were performed to be sent for cytology and pathology.   Target #2 Station 7, subcarinal node: The standard scope was then withdrawn and the endobronchial ultrasound was used to identify and characterize the peritracheal, hilar and bronchial lymph nodes. Inspection showed enlarged subcarinal node and a small 23mm node in the right hilum. Using real-time ultrasound guidance Wang needle biopsies were take from Station 7 nodes and were sent for cytology. The patient tolerated the procedure well without apparent complications. There was no significant blood loss.  At the end of the procedure a general airway inspection was performed and there was no evidence of active bleeding. The bronchoscope was removed.  The patient tolerated the procedure well. There was no significant blood loss and there were no obvious complications. A post-procedural chest x-ray is pending.  Samples Target #1: 1. Transbronchial needle brushings from RLL 2. Transbronchial Wang needle biopsies from RLL 3. Transbronchial forceps biopsies from RLL  Samples Target #2: 1.  Ultrasound-guided transbronchial needle aspiration, station 7  Plans:  The patient will be discharged from the PACU to home when recovered from anesthesia and after chest x-ray is reviewed. We will review the cytology, pathology and microbiology results with the patient when they become available. Outpatient followup will be with Garner Nash, DO.     Garner Nash, DO [facility] 11/09/2021 3:13 PM

## 2021-11-09 NOTE — Discharge Instructions (Signed)
Flexible Bronchoscopy, Care After This sheet gives you information about how to care for yourself after your test. Your doctor may also give you more specific instructions. If you have problems or questions, contact your doctor. Follow these instructions at home: Eating and drinking Do not eat or drink anything (not even water) for 2 hours after your test, or until your numbing medicine (local anesthetic) wears off. When your numbness is gone and your cough and gag reflexes have come back, you may: Eat only soft foods. Slowly drink liquids. The day after the test, go back to your normal diet. Driving Do not drive for 24 hours if you were given a medicine to help you relax (sedative). Do not drive or use heavy machinery while taking prescription pain medicine. General instructions  Take over-the-counter and prescription medicines only as told by your doctor. Return to your normal activities as told. Ask what activities are safe for you. Do not use any products that have nicotine or tobacco in them. This includes cigarettes and e-cigarettes. If you need help quitting, ask your doctor. Keep all follow-up visits as told by your doctor. This is important. It is very important if you had a tissue sample (biopsy) taken. Get help right away if: You have shortness of breath that gets worse. You get light-headed. You feel like you are going to pass out (faint). You have chest pain. You cough up: More than a little blood. More blood than before. Summary Do not eat or drink anything (not even water) for 2 hours after your test, or until your numbing medicine wears off. Do not use cigarettes. Do not use e-cigarettes. Get help right away if you have chest pain.  This information is not intended to replace advice given to you by your health care provider. Make sure you discuss any questions you have with your health care provider. Document Released: 08/07/2009 Document Revised: 09/22/2017 Document  Reviewed: 10/28/2016 Elsevier Patient Education  2020 [employer].

## 2021-11-09 NOTE — Anesthesia Postprocedure Evaluation (Signed)
Anesthesia Post Note  Patient: Carlos Erickson  Procedure(s) Performed: ROBOTIC ASSISTED NAVIGATIONAL BRONCHOSCOPY BRONCHIAL NEEDLE ASPIRATION BIOPSIES BRONCHIAL BIOPSIES BRONCHIAL BRUSHINGS VIDEO BRONCHOSCOPY WITH ENDOBRONCHIAL ULTRASOUND     Patient location during evaluation: PACU Anesthesia Type: General Level of consciousness: awake Pain management: pain level controlled Vital Signs Assessment: post-procedure vital signs reviewed and stable Respiratory status: spontaneous breathing Cardiovascular status: stable Postop Assessment: no apparent nausea or vomiting Anesthetic complications: no   No notable events documented.  Last Vitals:  Vitals:   11/09/21 1530 11/09/21 1545  BP: 139/82 (!) 141/87  Pulse: 70 79  Resp: 12 15  Temp:    SpO2: 98% 97%    Last Pain:  Vitals:   11/09/21 1545  TempSrc:   PainSc: 0-No pain                 Huston Foley

## 2021-11-09 NOTE — Transfer of Care (Signed)
Immediate Anesthesia Transfer of Care Note  Patient: Carlos Erickson  Procedure(s) Performed: ROBOTIC ASSISTED NAVIGATIONAL BRONCHOSCOPY BRONCHIAL NEEDLE ASPIRATION BIOPSIES BRONCHIAL BIOPSIES BRONCHIAL BRUSHINGS VIDEO BRONCHOSCOPY WITH ENDOBRONCHIAL ULTRASOUND  Patient Location: PACU  Anesthesia Type:General  Level of Consciousness: awake  Airway & Oxygen Therapy: Patient Spontanous Breathing and Patient connected to face mask oxygen  Post-op Assessment: Report given to RN and Post -op Vital signs reviewed and stable  Post vital signs: Reviewed and stable  Last Vitals:  Vitals Value Taken Time  BP 145/85 11/09/21 1514  Temp    Pulse 85 11/09/21 1514  Resp 13 11/09/21 1515  SpO2 95 % 11/09/21 1514  Vitals shown include unvalidated device data.  Last Pain:  Vitals:   11/09/21 1320  TempSrc:   PainSc: 3       Patients Stated Pain Goal: 4 (59/74/71 8550)  Complications: No notable events documented.

## 2021-11-09 NOTE — Anesthesia Procedure Notes (Signed)
Procedure Name: Intubation Date/Time: 11/09/2021 2:06 PM Performed by: Erick Colace, CRNA Pre-anesthesia Checklist: Patient identified, Emergency Drugs available, Suction available and Patient being monitored Patient Re-evaluated:Patient Re-evaluated prior to induction Oxygen Delivery Method: Circle system utilized Preoxygenation: Pre-oxygenation with 100% oxygen Induction Type: IV induction Ventilation: Mask ventilation without difficulty Laryngoscope Size: Mac and 4 Grade View: Grade II Tube type: Oral Tube size: 8.5 mm Number of attempts: 1 Airway Equipment and Method: Stylet and Oral airway Placement Confirmation: ETT inserted through vocal cords under direct vision, positive ETCO2 and breath sounds checked- equal and bilateral Secured at: 23 cm Tube secured with: Tape Dental Injury: Teeth and Oropharynx as per pre-operative assessment

## 2021-11-09 NOTE — Interval H&P Note (Signed)
History and Physical Interval Note:  11/09/2021 1:53 PM  Carlos Erickson  has presented today for surgery, with the diagnosis of lung mass.  The various methods of treatment have been discussed with the patient and family. After consideration of risks, benefits and other options for treatment, the patient has consented to  Procedure(s) with comments: ROBOTIC ASSISTED NAVIGATIONAL BRONCHOSCOPY (N/A) - ION w/ CIOS as a surgical intervention.  The patient's history has been reviewed, patient examined, no change in status, stable for surgery.  I have reviewed the patient's chart and labs.  Questions were answered to the patient's satisfaction.     [place]

## 2021-11-10 ENCOUNTER — Inpatient Hospital Stay: Payer: Medicare Other | Admitting: Hematology

## 2021-11-10 ENCOUNTER — Encounter: Payer: Self-pay | Admitting: *Deleted

## 2021-11-10 NOTE — Progress Notes (Signed)
Oncology Nurse Navigator Documentation  Oncology Nurse Navigator Flowsheets 11/10/2021 11/04/2021 11/02/2021 11/01/2021  Abnormal Finding Date - - - 10/31/2021  Diagnosis Status Confirmed Diagnosis Complete - - Additional Work Up  Navigator Follow Up Date: 11/15/2021 - 11/10/2021 11/02/2021  Navigator Follow Up Reason: Review Note - New Patient Appointment Appointment Review  Navigator Location [facility]  Referral Date to RadOnc/MedOnc - - - 11/01/2021  Navigator Encounter Type Appt/Treatment Plan Review Appt/Treatment Plan Review Appt/Treatment Plan Review Telephone  Telephone - - - Education;Asess Navigation Needs;Outgoing Call  Patient Visit Type - - - Initial  Treatment Phase Pre-Tx/Tx Discussion Abnormal Scans Abnormal Scans Abnormal Scans  Barriers/Navigation Needs Coordination of Care Coordination of Care Coordination of Care Coordination of Care;Education  Education - - - Other  Interventions Coordination of Care/I followed up on Mr. Gerbino's treatment schedule. There have been a few adjustments to his schedule but he is set up at this time.  Will follow up on Dr. Ernestina Penna note.  Coordination of Care Coordination of Care Coordination of Care;Education;Psycho-Social Support  Acuity Level 2-Minimal Needs (1-2 Barriers Identified) Level 2-Minimal Needs (1-2 Barriers Identified) Level 2-Minimal Needs (1-2 Barriers Identified) Level 3-Moderate Needs (3-4 Barriers Identified)  Coordination of Care Other Other Other Appts  Education Method - - - Verbal  Time Spent with Patient 30 15 30  60

## 2021-11-11 ENCOUNTER — Ambulatory Visit (HOSPITAL_COMMUNITY)
Admission: RE | Admit: 2021-11-11 | Discharge: 2021-11-11 | Disposition: A | Discharge status: Home / Self Care | Payer: Medicare Other | Source: Ambulatory Visit | Attending: Hematology | Admitting: Hematology

## 2021-11-11 ENCOUNTER — Other Ambulatory Visit: Payer: Self-pay

## 2021-11-11 ENCOUNTER — Telehealth: Payer: Self-pay | Admitting: Hematology

## 2021-11-11 ENCOUNTER — Telehealth: Payer: Self-pay | Admitting: Pulmonary Disease

## 2021-11-11 DIAGNOSIS — R918 Other nonspecific abnormal finding of lung field: Secondary | ICD-10-CM | POA: Insufficient documentation

## 2021-11-11 DIAGNOSIS — M546 Pain in thoracic spine: Secondary | ICD-10-CM | POA: Diagnosis not present

## 2021-11-11 DIAGNOSIS — C3490 Malignant neoplasm of unspecified part of unspecified bronchus or lung: Secondary | ICD-10-CM | POA: Insufficient documentation

## 2021-11-11 IMAGING — MR MR THORACIC SPINE WO/W CM
8 of 10 series · 33 of 48 positions shown · IV contrast (gadavist)
Comparison: Chest and lumbar CT from [DATE]

CLINICAL DATA: Non-small cell lung cancer staging. Severe upper
back pain for 2 months

EXAM:
MRI THORACIC WITHOUT AND WITH CONTRAST
TECHNIQUE: Multiplanar and multiecho pulse sequences of the thoracic spine were
obtained without and with intravenous contrast.
CONTRAST:  8mL GADAVIST GADOBUTROL 1 MMOL/ML IV SOLN

[Series 32: T1 · sagittal · 4.0mm · 1.72mm/px · 1 of 5 slices shown (1 of 3)]
[im 1/5]
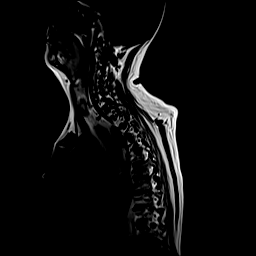

[Series 33: STIR · sagittal · 3.0mm · 1.06mm/px · 2 of 15 slices shown]
[im 1/15]
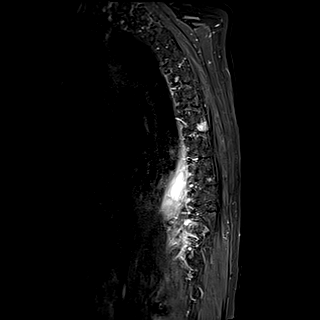
[im 15/15]
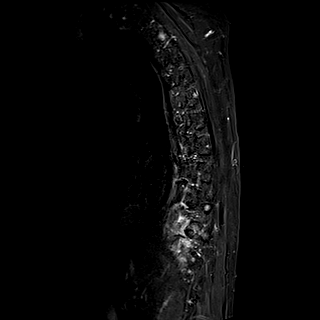

[Series 34: T1 · sagittal · 3.0mm · 1.06mm/px · 2 of 15 slices shown (2 of 3)]
[im 1/15]
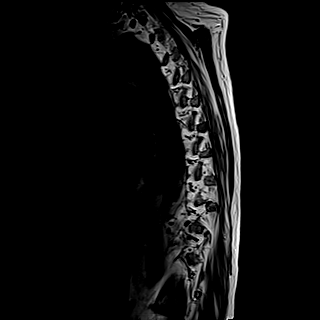
[im 15/15]
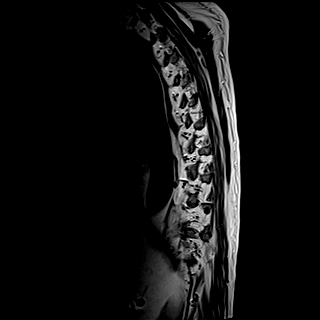

[Series 35: T2 · axial · 4.0mm · 0.78mm/px · z∈[-471,-228]mm · 9 of 52 slices shown]
[im 1/52]
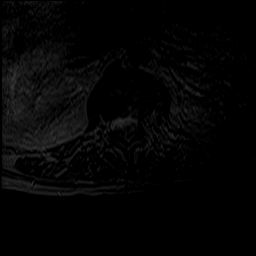
[im 7/52]
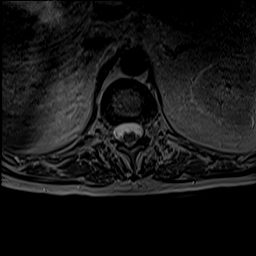
[im 13/52]
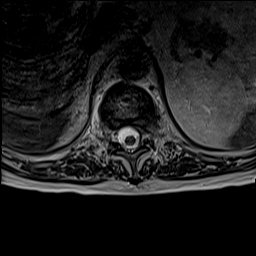
[im 20/52]
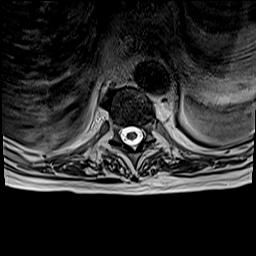
[im 26/52]
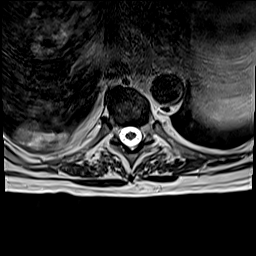
[im 32/52]
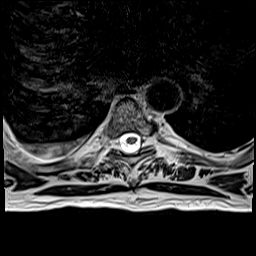
[im 39/52]
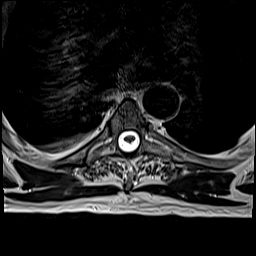
[im 45/52]
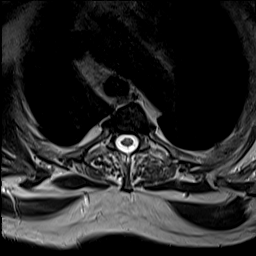
[im 52/52]
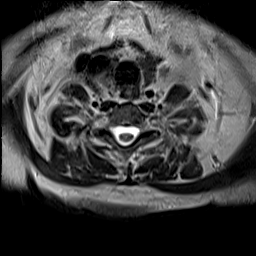

[Series 37: T1 · axial · 4.0mm · 0.52mm/px · z∈[-471,-228]mm · 8 of 52 slices shown (3 of 3)]
[im 1/52]
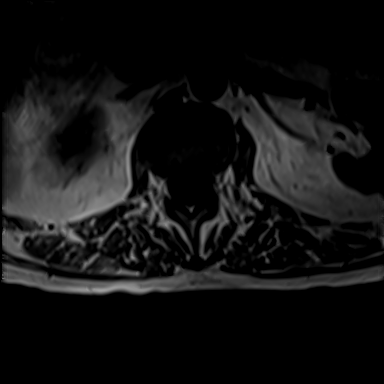
[im 7/52]
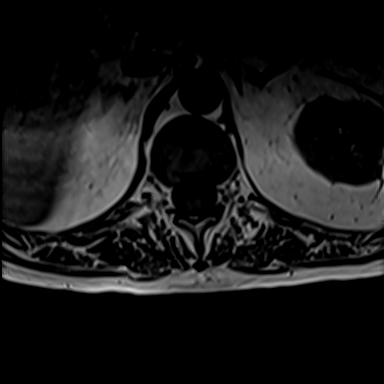
[im 13/52]
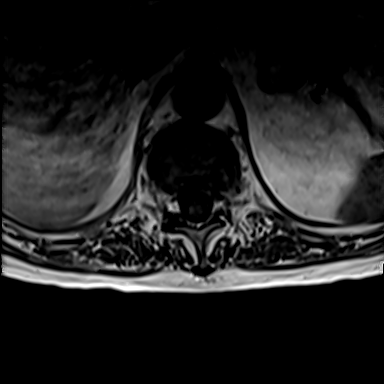
[im 20/52]
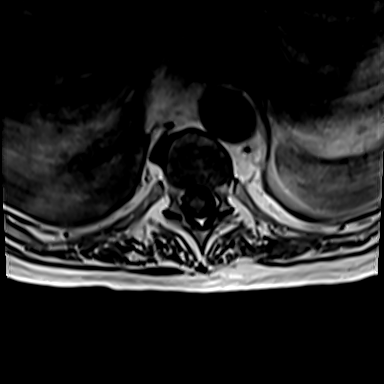
[im 32/52]
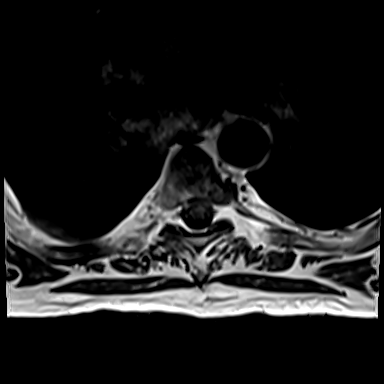
[im 39/52]
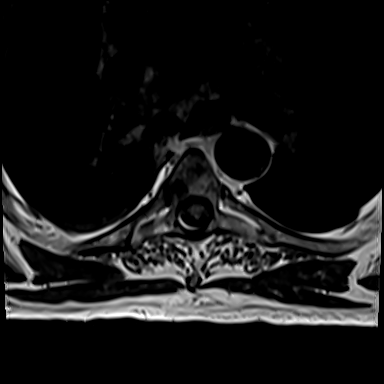
[im 45/52]
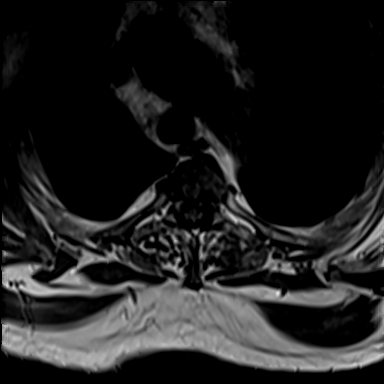
[im 52/52]
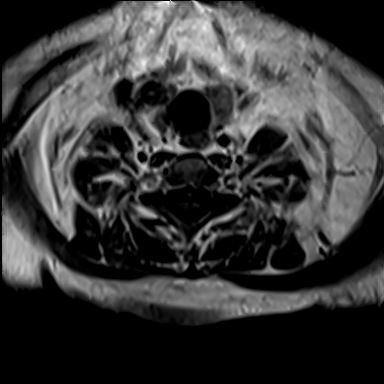

[Series 38: T2 post-contrast · sagittal · 3.0mm · 0.89mm/px · 2 of 15 slices shown]
[im 1/15]
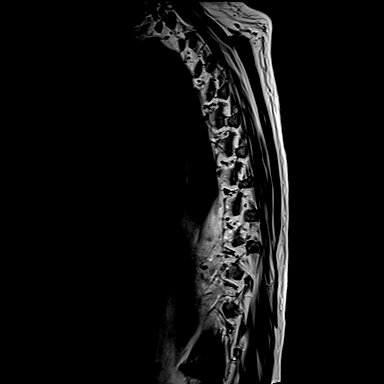
[im 15/15]
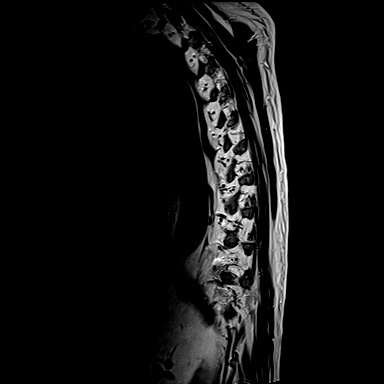

[Series 39: T1 fat-sat post-contrast · sagittal · 3.0mm · 1.06mm/px · 2 of 15 slices shown]
[im 1/15]
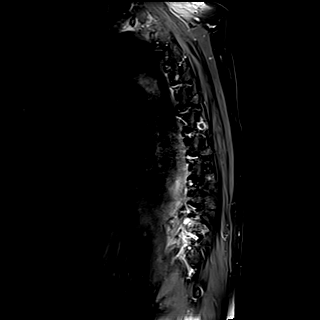
[im 15/15]
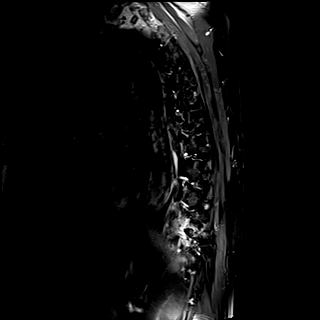

[Series 40: T1 post-contrast · axial · 4.0mm · 0.39mm/px · z∈[-471,-257]mm · 7 of 52 slices shown]
[im 1/52]
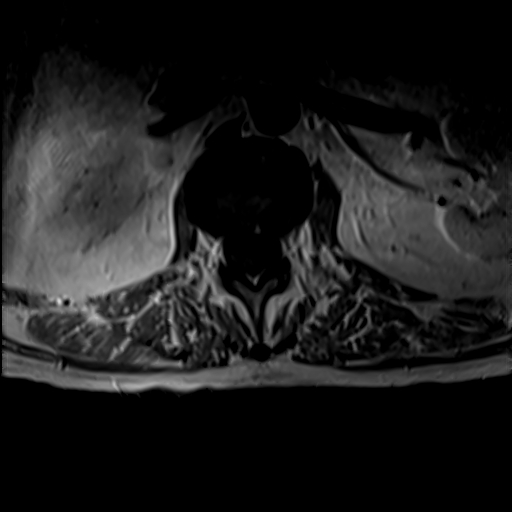
[im 7/52]
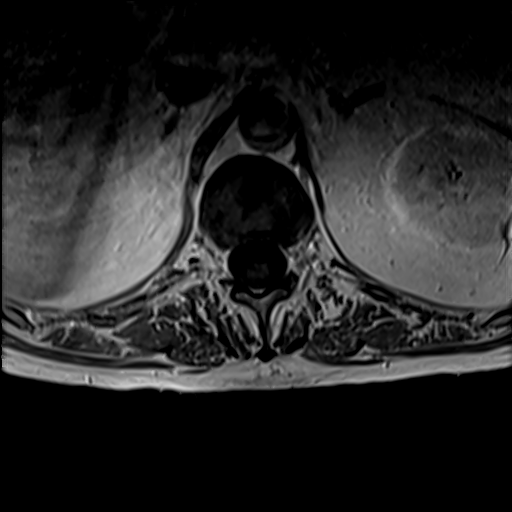
[im 13/52]
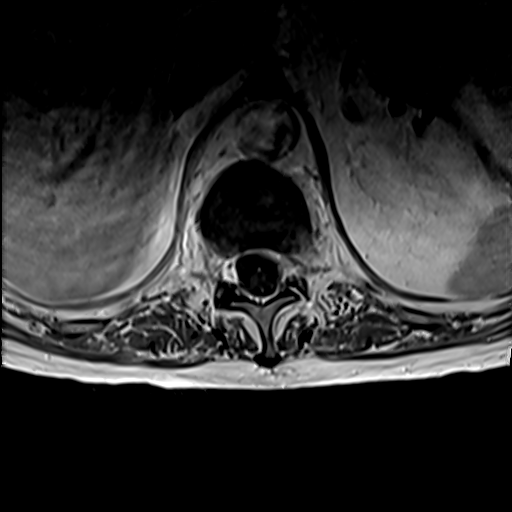
[im 20/52]
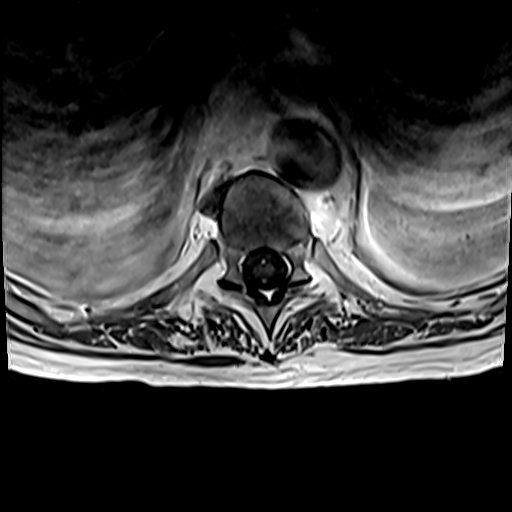
[im 32/52]
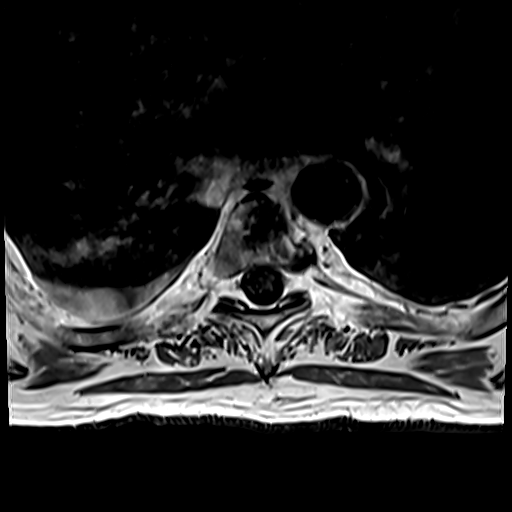
[im 39/52]
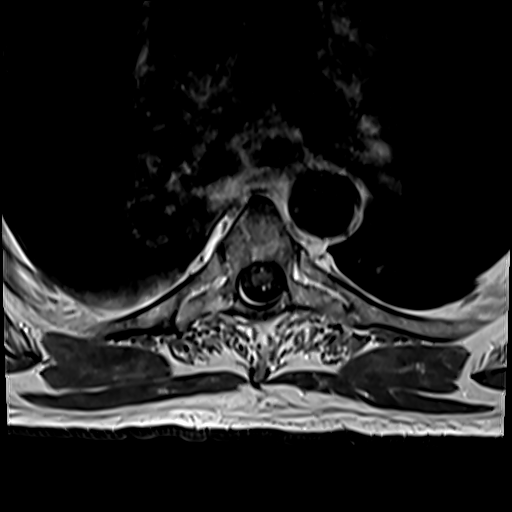
[im 45/52]
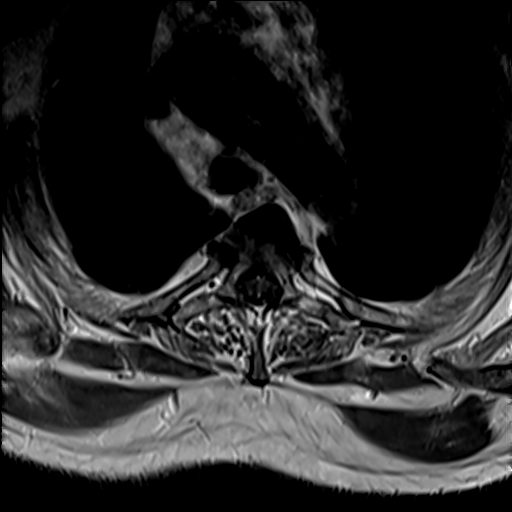

[33 of 48 positions shown; findings below may reference images not displayed]

FINDINGS: Alignment:  Negative for listhesis

Vertebrae: Numerous infiltrative bone lesions including:

*T2 spinous process.
*T5 right superior body
*T6 body. This metastasis is diffuse without compression fracture or
extraosseous extension. Smaller midline metastasis in the spinous
process at this level.
*T7 right articular process
*T8 right body
*Multifocal in the T9 body and right posterior element
*Multifocal in the T10 body
*T11 vertebra with growth throughout the body and into the posterior
elements. Compression fracture with fluid-filled cleft and 60%
height loss. Ventral epidural tumor at this level which contacts but
does not compress the cord
*T12 body which is diffuse and associated with small superior
endplate fracture.
*L1 left posterior body.

Cord: Normal signal. Accentuated surface vessels without definite
fistula. No evidence of intrathecal tumor.

Paraspinal and other soft tissues: Reported separately

Disc levels:

No significant degenerative changes for age. No degenerative
impingement
IMPRESSION: Numerous levels of vertebral metastatic disease (listed above) most
notable at T11 and T12 where there is confluent body involvement and
pathologic fracture. Body fracture at T11 causes 60% height loss and
retropulsion with ventral epidural tumor contacting but not
compressing the cord.

## 2021-11-11 IMAGING — MR MR HEAD WO/W CM
13 series · 48 of 48 positions shown · IV contrast (gadavist)
Comparison: [DATE]

CLINICAL DATA: Non-small cell lung cancer staging

EXAM:
MRI HEAD WITHOUT AND WITH CONTRAST
TECHNIQUE: Multiplanar, multiecho pulse sequences of the brain and surrounding
structures were obtained without and with intravenous contrast.
CONTRAST:  8mL GADAVIST GADOBUTROL 1 MMOL/ML IV SOLN

[Series 6: DWI · axial · 3.0mm · 1.36mm/px · z∈[-41,+97]mm · 5 of 96 slices shown (1 of 2)]
[im 1/96]
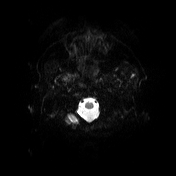
[im 24/96]
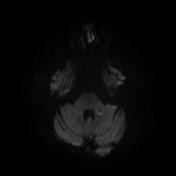
[im 48/96]
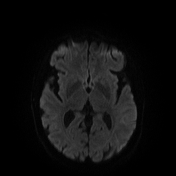
[im 72/96]
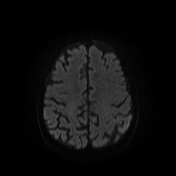
[im 96/96]
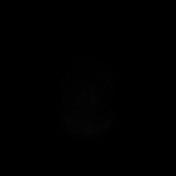

[Series 7: DWI · axial · 3.0mm · 1.36mm/px · z∈[-41,+94]mm · 2 of 47 slices shown (2 of 2)]
[im 1/47]
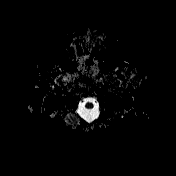
[im 47/47]
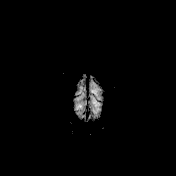

[Series 8: T1 · sagittal · 5.0mm · 0.75mm/px · 2 of 24 slices shown (1 of 2)]
[im 1/24]
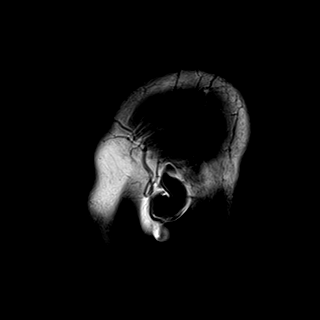
[im 24/24]
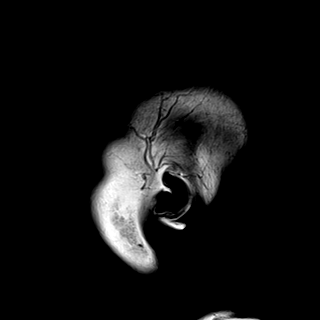

[Series 9: T2 · axial · 5.0mm · 0.62mm/px · z∈[-54,+105]mm · 2 of 26 slices shown (1 of 2)]
[im 1/26]
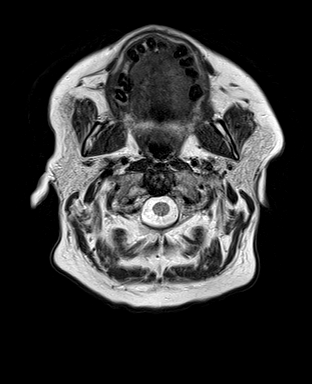
[im 26/26]
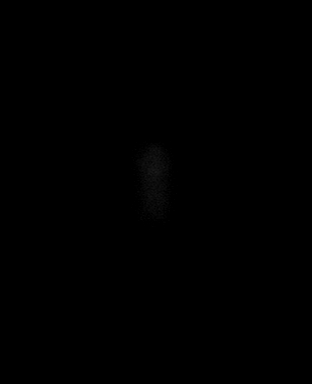

[Series 10: swi_images · axial · 3.0mm · 0.75mm/px · z∈[-55,+107]mm · 4 of 56 slices shown]
[im 1/56]
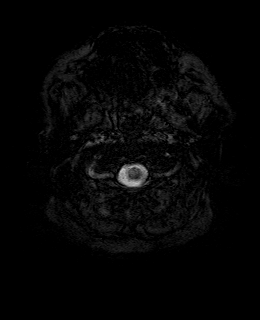
[im 19/56]
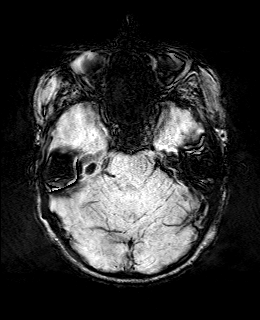
[im 37/56]
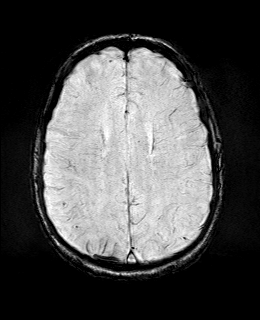
[im 56/56]
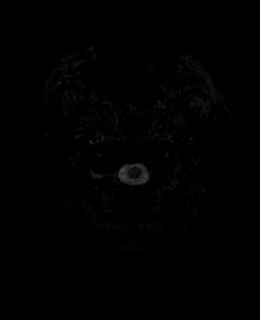

[Series 12: FLAIR · axial · 3.0mm · 0.75mm/px · z∈[-53,+97]mm · 3 of 52 slices shown]
[im 1/52]
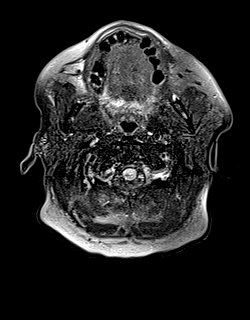
[im 26/52]
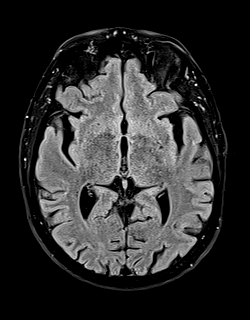
[im 52/52]
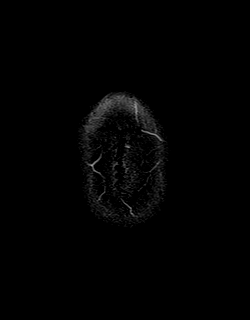

[Series 13: T1 · axial · 1.0mm · 0.94mm/px · z∈[-44,+96]mm · 9 of 144 slices shown (2 of 2)]
[im 1/144]
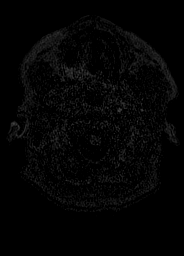
[im 18/144]
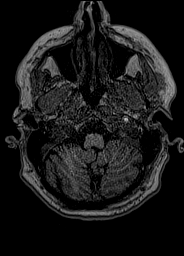
[im 36/144]
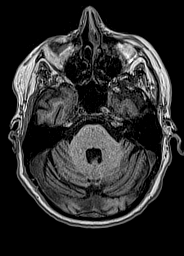
[im 54/144]
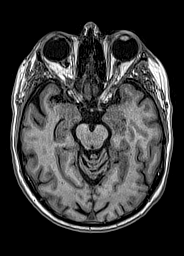
[im 72/144]
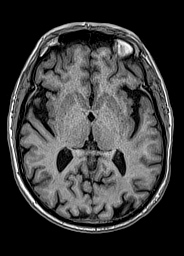
[im 90/144]
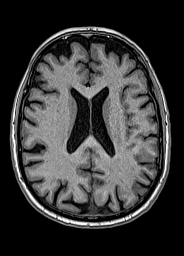
[im 108/144]
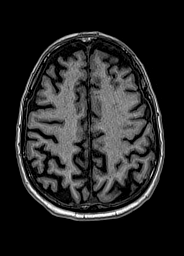
[im 126/144]
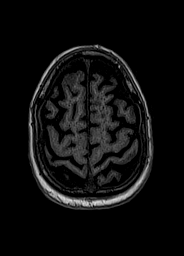
[im 144/144]
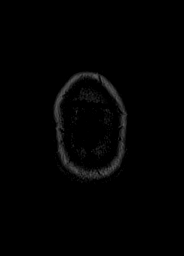

[Series 14: cor dwi_tracew · coronal · 5.0mm · 1.53mm/px · 3 of 48 slices shown]
[im 1/48]
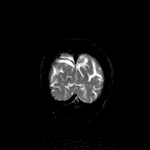
[im 24/48]
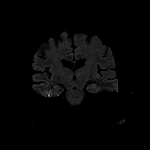
[im 48/48]
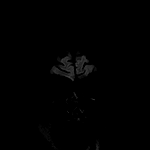

[Series 15: cor dwi_adc · coronal · 5.0mm · 1.53mm/px · 2 of 24 slices shown]
[im 1/24]
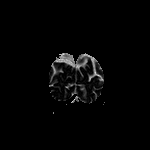
[im 24/24]
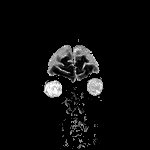

[Series 16: T2 · coronal · 5.0mm · 0.69mm/px · 2 of 28 slices shown (2 of 2)]
[im 1/28]
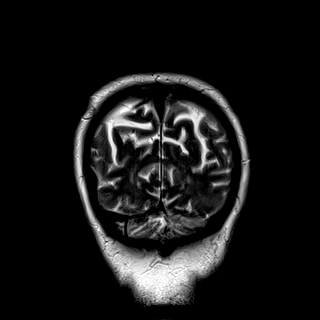
[im 28/28]
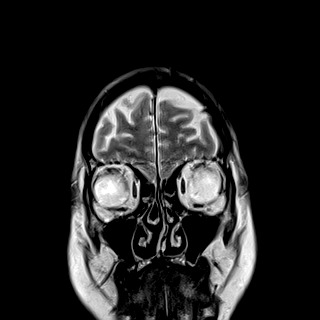

[Series 45: T1 post-contrast · axial · 1.0mm · 0.94mm/px · z∈[-3,+135]mm · 10 of 160 slices shown (1 of 3)]
[im 1/160]
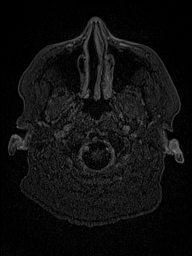
[im 18/160]
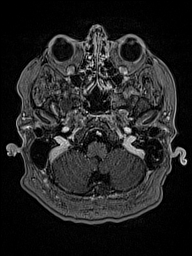
[im 36/160]
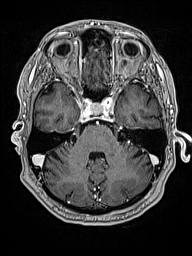
[im 54/160]
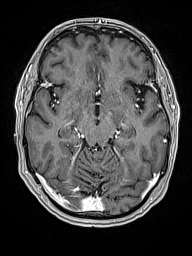
[im 71/160]
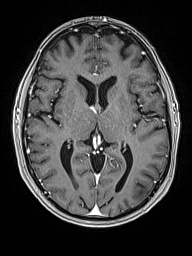
[im 89/160]
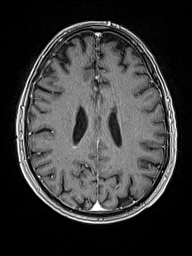
[im 107/160]
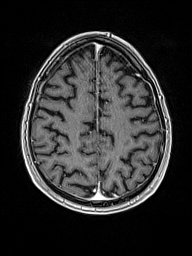
[im 124/160]
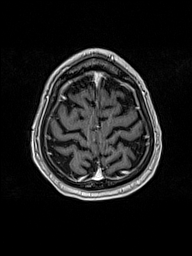
[im 142/160]
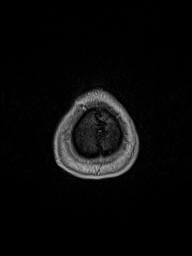
[im 160/160]
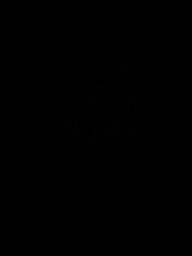

[Series 46: T1 post-contrast · coronal · 5.0mm · 0.43mm/px · 2 of 30 slices shown (2 of 3)]
[im 1/30]
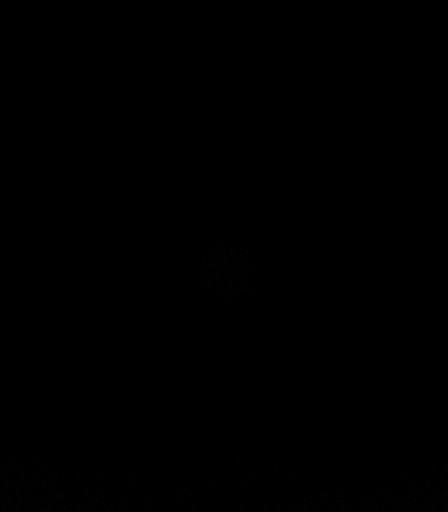
[im 30/30]
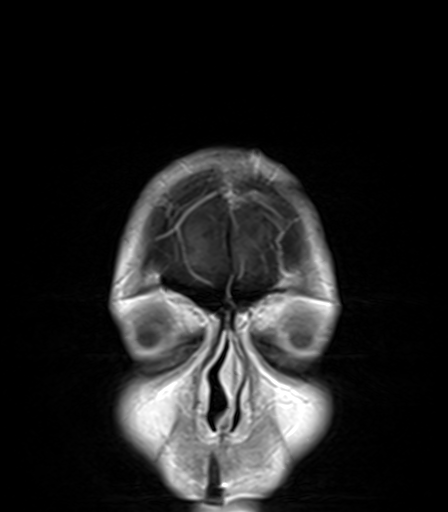

[Series 47: T1 post-contrast · sagittal · 5.0mm · 0.75mm/px · 2 of 24 slices shown (3 of 3)]
[im 1/24]
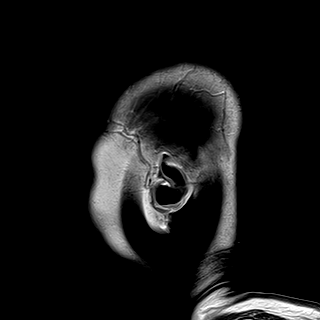
[im 24/24]
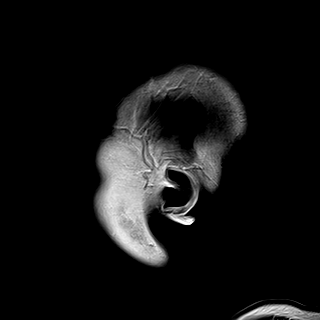

[48 of 48 positions shown; findings below may reference images not displayed]

FINDINGS: Brain: No swelling, infarction, hemorrhage, hydrocephalus,
extra-axial collection or mass lesion. No abnormal enhancement to
suggest metastatic disease. Mineralization at the left brachium
pontis which is stable from [DATE]. Brain volume is normal

Vascular: Normal flow voids and vascular enhancements

Skull and upper cervical spine: Normal marrow signal

Sinuses/Orbits: Right cataract resection.
IMPRESSION: Negative for metastatic disease.

## 2021-11-11 MED ORDER — GADOBUTROL 1 MMOL/ML IV SOLN
8.0000 mL | Freq: Once | INTRAVENOUS | Status: AC | PRN
  Administered 2021-11-11: 8 mL via INTRAVENOUS

## 2021-11-11 NOTE — Telephone Encounter (Signed)
Made regular hospital f/u appointment to a telephone visit. Called to confirm and remind patient of the appointment. Patient is aware.

## 2021-11-11 NOTE — Telephone Encounter (Signed)
Called and spoke with patient's wife. She stated that the patient has been clearing his throat a lot and has seen a very small amount of blood in his phlegm. She wanted to know if was normal. I advised her that is normal to see small amounts of blood up to a week after the procedure as well as some throat irritation. Advised her that he can use some Cepacol to help with the sore throat. She verbalized understanding.   While on the phone, she wanted to know if he could start back to taking his Plavix. He has been off of it for over a week. I looked at the instructions letter and it was worded "Plavix is currently being held, stay off". I advised her that I would check on this for her.   BI, can you advise about the Plavix? Thanks!

## 2021-11-11 NOTE — Telephone Encounter (Signed)
Called Carlos Erickson but she did not answer. I left a detailed message about restarting the Plavix as requested.   Nothing further needed at time of call.

## 2021-11-12 ENCOUNTER — Inpatient Hospital Stay (HOSPITAL_BASED_OUTPATIENT_CLINIC_OR_DEPARTMENT_OTHER): Payer: Medicare Other | Admitting: Hematology

## 2021-11-12 ENCOUNTER — Encounter (HOSPITAL_COMMUNITY): Payer: Self-pay | Admitting: Pulmonary Disease

## 2021-11-12 DIAGNOSIS — K5900 Constipation, unspecified: Secondary | ICD-10-CM | POA: Insufficient documentation

## 2021-11-12 DIAGNOSIS — C3431 Malignant neoplasm of lower lobe, right bronchus or lung: Secondary | ICD-10-CM | POA: Insufficient documentation

## 2021-11-12 DIAGNOSIS — G893 Neoplasm related pain (acute) (chronic): Secondary | ICD-10-CM | POA: Insufficient documentation

## 2021-11-12 DIAGNOSIS — C7951 Secondary malignant neoplasm of bone: Secondary | ICD-10-CM | POA: Insufficient documentation

## 2021-11-12 DIAGNOSIS — C3490 Malignant neoplasm of unspecified part of unspecified bronchus or lung: Secondary | ICD-10-CM | POA: Diagnosis not present

## 2021-11-12 LAB — CYTOLOGY - NON PAP

## 2021-11-12 MED ORDER — HYDROMORPHONE HCL 2 MG PO TABS: 2.0000 mg | ORAL_TABLET | Freq: Four times a day (QID) | ORAL | 0 refills | Status: DC | PRN

## 2021-11-12 MED ORDER — MORPHINE SULFATE ER 15 MG PO TBCR: 15.0000 mg | EXTENDED_RELEASE_TABLET | Freq: Two times a day (BID) | ORAL | 0 refills | Status: DC

## 2021-11-12 NOTE — Progress Notes (Addendum)
[place]   [phone] [phone]   Clinic Follow up Note   Patient Care Team: Owens Loffler, MD as PCP - General (Family Medicine) Wellington Hampshire, MD as PCP - Cardiology (Cardiology) Valrie Hart, RN as Oncology Nurse Navigator (Oncology)  Date of Service:  11/12/2021  I connected with Carlos Erickson on 11/12/2021 at 12:20 PM EST by telephone visit and verified that I am speaking with the correct person using two identifiers.  I discussed the limitations, risks, security and privacy concerns of performing an evaluation and management service by telephone and the availability of in person appointments. I also discussed with the patient that there may be a patient responsible charge related to this service. The patient expressed understanding and agreed to proceed.   Other persons participating in the visit and their role in the encounter:  patient's wife and son  Patient's location:  home Provider's location:  my office  CHIEF COMPLAINT: f/u of metastatic adenocarcinoma  CURRENT THERAPY:  Pending palliative radiation  ASSESSMENT & PLAN:  Carlos Erickson is a 71 y.o. male with   1. 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 Lumbar spine CT showed multifocal osseous metastatic disease with moderate pathologic fracture of T11. Further work up with CT CAP showed a 6.9 cm cavitary RLL lesion, no lymphadenopathy or other mets  -he underwent bronchoscopy on 11/09/21, pathology showed adenocarcinoma in RLL and level 7 lymph node. -thoracic spine MRI on 11/11/21 showed: numerous levels of vertebral metastatic disease, most notable at T11 and T12 where there is confluent body involvement and pathologic fracture. -brain MRI done the same day has not been read yet. No mets to my eyes  --I reviewed the new findings and work up thus far with pt and his family.  I discussed  the incurable nature of his metastatic disease, and treatment options. -given his level of pain, I will refer him for urgent consultation with rad onc. I will also refer him to Dr. Estanislado Pandy (per pt request) for consideration of kyphoplasty. -I spoke with pathology today and have requested FO on his biopsy sample  -I will see him back after radiation to discuss systemic treatment.  2. Pain from Pathologic fracture of T11 and T12 -secondary to #1 -his pain is not currently controlled. I called in dilaudid and MS contin for him. -I will also place urgent referral to rad onc for palliative radiation. -refer to palliative care clinic for pain management    PLAN: -referral to rad onc for palliative treatment and IR for kyphoplasty -palliative care referral to be seen next week  -I called in MS contin and dilaudid  -lab and f/u in about 2 weeks to finalize systemic therapy    No problem-specific Assessment & Plan notes found for this encounter.   SUMMARY OF ONCOLOGIC HISTORY: Oncology History  Bone metastasis ([place])   Initial Diagnosis   Bone metastasis ([place])   10/31/2021 Imaging   EXAM: CT LUMBAR SPINE WITH CONTRAST  IMPRESSION: 1. 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 Moderate pathologic fracture of T11, with suspected epidural and extraosseous extension of tumor there. Consider encroachment on the lower thoracic spinal cord, MRI would be confirmatory. Mild pathologic fracture of T12. L4 and L5 vertebral metastases also suspected.   2. See also CTA abdomen and Pelvis today reported separately.   3. Lumbar spine degeneration with only mild degenerative  spinal stenosis.   10/31/2021 Imaging   EXAM: CTA ABDOMEN AND PELVIS WITHOUT AND WITH CONTRAST  IMPRESSION: VASCULAR   1. Negative for abdominal aortic aneurysm, dissection or other acute vascular abnormality. 2. Aortic Atherosclerosis (ICD10-I70.0).   NON-VASCULAR   1. Unfortunately, CT findings  are highly concerning for a centrally necrotic 7.4 cm primary bronchogenic carcinoma in the posterior right lower lobe with multifocal osseous metastatic disease including likely pathologic fractures at T11 and T12. Given central necrosis, the primary differential consideration is squamous cell carcinoma. Recommend referral to multi disciplinary thoracic tumor board for further evaluation and tissue diagnosis. 2. Pathologic T11 compression fracture with approximately 40% height loss. 3. Likely subtle pathologic fracture involving the superior endplate of X52 without height loss. 4. Multifocal osseous metastatic disease including at least T9, T11, T12, L4 and L5. 5. No solid organ metastasis identified within the abdomen or pelvis. 6. 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   10/31/2021 Imaging   EXAM: CT CHEST WITH CONTRAST  IMPRESSION: 1. There is a thick-walled subpleural cavitary lesion of the dependent right lower lobe measuring 6.9 cm, highly concerning for primary lung malignancy. Cavitary infection is however a general differential consideration. 2. Wedge deformity of T11 and superior endplate deformity of W41 with subtle underlying lytic character, highly concerning for osseous metastatic disease and pathologic fracture. 3. Additional small lucent focus at the left aspect of T9, suspicious for an additional metastatic lesion. 4. No evidence of lymphadenopathy in the chest. 5. Emphysema. 6. Coronary artery disease.   Aortic Atherosclerosis (ICD10-I70.0) and Emphysema (ICD10-J43.9).   11/09/2021 Initial Biopsy   FINAL MICROSCOPIC DIAGNOSIS:  A. LUNG, RLL, FINE NEEDLE ASPIRATION AND BIOPSIES:  - Adenocarcinoma.  See comment.  B. LUNG, RLL, BRUSH:  - Adenocarcinoma.   FINAL MICROSCOPIC DIAGNOSIS:  C. LYMPH NODE, 7, FINE NEEDLE ASPIRATION:  - Adenocarcinoma.  COMMENT:  The cells stain positively for cytokeratin 7 and cytokeratin 20, and   have patchy positivity for CDX2.  TTF-1 and p40 immunostains are negative.  The differential diagnosis includes primary pulmonary adenocarcinoma as well as a GI primary.   11/11/2021 Imaging   EXAM: MRI THORACIC WITHOUT AND WITH CONTRAST  IMPRESSION: Numerous levels of vertebral metastatic disease (listed above) most notable at T11 and T12 where there is confluent body involvement and pathologic fracture. Body fracture at T11 causes 60% height loss and retropulsion with ventral epidural tumor contacting but not compressing the cord.      INTERVAL HISTORY:  Carlos Erickson was contacted for a follow up of metastatic disease. He was last seen by me on 10/31/21 while he was in the hospital.  He reports he is in near constant pain.   All other systems were reviewed with the patient and are negative.  MEDICAL HISTORY:  Past Medical History:  Diagnosis Date   Arthritis    Barrett's esophagus 07/04/2016   pt states he was told he does not have this.   CAD (coronary artery disease), native coronary artery 05/2014   Non-ST elevation myocardial infarction. Echocardiogram showed normal LV systolic function with mild to moderate mitral regurgitation. Cardiac catheterization showed significant two-vessel coronary artery disease including the RCA and left anterior descending artery. He underwent CABG at [facility].   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   Sleep apnea 03/2019   on CPAP    SURGICAL HISTORY: Past Surgical History:  Procedure Laterality Date   BRONCHIAL BIOPSY  11/09/2021   Procedure: BRONCHIAL BIOPSIES;  Surgeon: Garner Nash, DO;  Location: [facility];  Service: Pulmonary;;   BRONCHIAL BRUSHINGS  11/09/2021   Procedure: BRONCHIAL BRUSHINGS;  Surgeon: Garner Nash, DO;  Location: [facility];  Service:  Pulmonary;;   BRONCHIAL NEEDLE ASPIRATION BIOPSY  11/09/2021   Procedure: BRONCHIAL NEEDLE ASPIRATION BIOPSIES;  Surgeon: Garner Nash, DO;  Location: [place];  Service: Pulmonary;;   CARDIAC CATHETERIZATION  03/2014   [facility]   cataract Right 1980   Dr. Katy Fitch   CORONARY ARTERY BYPASS GRAFT  05/2014   [facility], CABG x 2   EYE SURGERY     HEMORROIDECTOMY     LUNG BIOPSY  2006   [facility]   VIDEO BRONCHOSCOPY WITH ENDOBRONCHIAL ULTRASOUND  11/09/2021   Procedure: VIDEO BRONCHOSCOPY WITH ENDOBRONCHIAL ULTRASOUND;  Surgeon: Garner Nash, DO;  Location: [facility];  Service: Pulmonary;;    I have reviewed the social history and family history with the patient and they are unchanged from previous note.  ALLERGIES:  is allergic to plasma protein fraction, icosapent ethyl, and morphine and related.  MEDICATIONS:  Current Outpatient Medications  Medication Sig Dispense Refill   acetaminophen (TYLENOL) 325 MG tablet Take 2 tablets (650 mg total) by mouth every 6 (six) hours as needed for up to 30 doses for mild pain or moderate pain. 30 tablet 0   acetaminophen (TYLENOL) 500 MG tablet Take 500 mg by mouth every 6 (six) hours as needed for moderate pain or headache.     amLODipine (NORVASC) 10 MG tablet TAKE 1 TABLET BY MOUTH  DAILY (Patient taking differently: Take 10 mg by mouth daily.) 90 tablet 3   atorvastatin (LIPITOR) 20 MG tablet TAKE 1 TABLET BY MOUTH ONCE DAILY (Patient taking differently: Take 20 mg by mouth daily.) 90 tablet 3   carvedilol (COREG) 6.25 MG tablet TAKE 1 TABLET BY MOUTH  TWICE DAILY (Patient taking differently: Take 6.25 mg by mouth 2 (two) times daily with a meal.) 180 tablet 3   clopidogrel (PLAVIX) 75 MG tablet TAKE 1 TABLET BY MOUTH ONCE DAILY (Patient taking differently: Take 75 mg by mouth daily.) 90 tablet 1   Evolocumab (REPATHA SURECLICK) 601 MG/ML SOAJ Inject 1 pen into the skin every 14 (fourteen) days. (Patient not taking: Reported on 10/31/2021) 6 mL 3    HYDROmorphone (DILAUDID) 2 MG tablet Take 1 tablet (2 mg total) by mouth every 8 (eight) hours as needed for up to 21 days for severe pain. 30 tablet 0   losartan (COZAAR) 100 MG tablet TAKE 1 TABLET BY MOUTH  DAILY (Patient taking differently: Take 100 mg by mouth daily.) 90 tablet 3   omeprazole (PRILOSEC) 20 MG capsule Take 1 capsule (20 mg total) by mouth 2 (two) times daily before a meal. (Patient taking differently: Take 20 mg by mouth daily.) 60 capsule 0   predniSONE (DELTASONE) 10 MG tablet Take 10 mg by mouth See admin instructions. Take 1 tab TID for 2 days, 1 tab BID for 5 days, 1 tab daily until finished. (Patient not taking: Reported on 11/04/2021)     senna-docusate (SENOKOT-S) 8.6-50 MG tablet Take 1 tablet by mouth daily for 30 doses. 30 tablet 0   No current facility-administered medications for this visit.    PHYSICAL EXAMINATION: ECOG PERFORMANCE STATUS: 3 - Symptomatic, >  50% confined to bed  There were no vitals filed for this visit. Wt Readings from Last 3 Encounters:  11/09/21 180 lb (81.6 kg)  11/04/21 182 lb 12.8 oz (82.9 kg)  07/28/21 193 lb (87.5 kg)     No vitals taken today, Exam not performed today  LABORATORY DATA:  I have reviewed the data as listed CBC Latest Ref Rng & Units 11/09/2021 10/31/2021 11/28/2019  WBC 4.0 - 10.5 K/uL 9.4 13.5(H) 7.5  Hemoglobin 13.0 - 17.0 g/dL 14.7 15.6 16.1  Hematocrit 39.0 - 52.0 % 44.2 48.2 45.5  Platelets 150 - 400 K/uL 304 290 261     CMP Latest Ref Rng & Units 11/09/2021 10/31/2021 05/27/2021  Glucose 70 - 99 mg/dL 109(H) 123(H) -  BUN 8 - 23 mg/dL 14 24(H) -  Creatinine 0.61 - 1.24 mg/dL 0.90 1.06 -  Sodium 135 - 145 mmol/L 133(L) 136 -  Potassium 3.5 - 5.1 mmol/L 4.9 4.0 -  Chloride 98 - 111 mmol/L 97(L) 97(L) -  CO2 22 - 32 mmol/L 25 26 -  Calcium 8.9 - 10.3 mg/dL 9.1 9.5 -  Total Protein 6.5 - 8.1 g/dL - - 7.3  Total Bilirubin 0.3 - 1.2 mg/dL - - 1.1  Alkaline Phos 38 - 126 U/L - - 69  AST 15 - 41 U/L - - 26  ALT  0 - 44 U/L - - 28      RADIOGRAPHIC STUDIES: I have personally reviewed the radiological images as listed and agreed with the findings in the report. MR THORACIC SPINE W WO CONTRAST  Result Date: 11/12/2021 CLINICAL DATA:  Non-small cell lung cancer staging. Severe upper back pain for 2 months EXAM: MRI THORACIC WITHOUT AND WITH CONTRAST TECHNIQUE: Multiplanar and multiecho pulse sequences of the thoracic spine were obtained without and with intravenous contrast. CONTRAST:  13mL GADAVIST GADOBUTROL 1 MMOL/ML IV SOLN COMPARISON:  Chest and lumbar CT from 10/31/2021 FINDINGS: Alignment:  Negative for listhesis Vertebrae: Numerous infiltrative bone lesions including: *T2 spinous process. *T5 right superior body *T6 body. This metastasis is diffuse without compression fracture or extraosseous extension. Smaller midline metastasis in the spinous process at this level. *T7 right articular process *T8 right body *Multifocal in the T9 body and right posterior element *Multifocal in the T10 body *T11 vertebra with growth throughout the body and into the posterior elements. Compression fracture with fluid-filled cleft and 60% height loss. Ventral epidural tumor at this level which contacts but does not compress the cord *T12 body which is diffuse and associated with small superior endplate fracture. *L1 left posterior body. Cord: Normal signal. Accentuated surface vessels without definite fistula. No evidence of intrathecal tumor. Paraspinal and other soft tissues: Reported separately Disc levels: No significant degenerative changes for age. No degenerative impingement IMPRESSION: Numerous levels of vertebral metastatic disease (listed above) most notable at T11 and T12 where there is confluent body involvement and pathologic fracture. Body fracture at T11 causes 60% height loss and retropulsion with ventral epidural tumor contacting but not compressing the cord. Electronically Signed   By: Jorje Guild M.D.   On:  11/12/2021 08:51      Orders Placed This Encounter  Procedures   Ambulatory referral to Radiation Oncology    Referral Priority:   Urgent    Referral Type:   Consultation    Referral Reason:   Specialty Services Required    Requested Specialty:   Radiation Oncology    Number of Visits Requested:   1  Amb Referral to Palliative Care    Referral Priority:   Urgent    Referral Type:   Consultation    Number of Visits Requested:   1   All questions were answered. The patient knows to call the clinic with any problems, questions or concerns. No barriers to learning was detected. The total time spent in the appointment was 25 minutes.     Truitt Merle, MD 11/12/2021   I, Wilburn Mylar, am acting as scribe for Truitt Merle, MD.   I have reviewed the above documentation for accuracy and completeness, and I agree with the above.

## 2021-11-13 ENCOUNTER — Encounter: Payer: Self-pay | Admitting: Hematology

## 2021-11-13 DIAGNOSIS — C3490 Malignant neoplasm of unspecified part of unspecified bronchus or lung: Secondary | ICD-10-CM | POA: Insufficient documentation

## 2021-11-13 DIAGNOSIS — C7951 Secondary malignant neoplasm of bone: Secondary | ICD-10-CM | POA: Insufficient documentation

## 2021-11-15 ENCOUNTER — Encounter: Payer: Self-pay | Admitting: Radiation Oncology

## 2021-11-15 ENCOUNTER — Ambulatory Visit
Admission: RE | Admit: 2021-11-15 | Discharge: 2021-11-15 | Disposition: A | Discharge status: Home / Self Care | Payer: Medicare Other | Source: Ambulatory Visit | Attending: Radiation Oncology | Admitting: Radiation Oncology

## 2021-11-15 ENCOUNTER — Encounter: Payer: Medicare Other | Admitting: Nurse Practitioner

## 2021-11-15 ENCOUNTER — Other Ambulatory Visit: Payer: Self-pay

## 2021-11-15 ENCOUNTER — Ambulatory Visit: Admission: RE | Admit: 2021-11-15 | Payer: Medicare Other | Source: Ambulatory Visit

## 2021-11-15 ENCOUNTER — Telehealth: Payer: Self-pay

## 2021-11-15 VITALS — Ht 68.0 in | Wt 174.0 lb

## 2021-11-15 DIAGNOSIS — Z510 Encounter for antineoplastic radiation therapy: Secondary | ICD-10-CM | POA: Diagnosis not present

## 2021-11-15 DIAGNOSIS — G893 Neoplasm related pain (acute) (chronic): Secondary | ICD-10-CM | POA: Insufficient documentation

## 2021-11-15 DIAGNOSIS — K5900 Constipation, unspecified: Secondary | ICD-10-CM | POA: Insufficient documentation

## 2021-11-15 DIAGNOSIS — C7951 Secondary malignant neoplasm of bone: Secondary | ICD-10-CM | POA: Diagnosis not present

## 2021-11-15 DIAGNOSIS — C3431 Malignant neoplasm of lower lobe, right bronchus or lung: Secondary | ICD-10-CM | POA: Diagnosis not present

## 2021-11-15 DIAGNOSIS — C3490 Malignant neoplasm of unspecified part of unspecified bronchus or lung: Secondary | ICD-10-CM | POA: Diagnosis not present

## 2021-11-15 NOTE — Telephone Encounter (Signed)
Spoke with pt via telephone to give him his MRI of brain results per Dr. Ernestina Penna request.  Pt's MRI per Dr. Burr Medico was goodand negative.  Pt verbalized understanding and had no further questions or concerns.

## 2021-11-15 NOTE — Progress Notes (Signed)
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 Bone metastasis ([place])  C79.51     2. 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 Primary cancer of right lower lobe of lung ([place])  C34.31       HISTORY OF PRESENT ILLNESS::Cyncere H Kelch is a 71 y.o. male who presented to ED on 10/31/21 with worsening back and abdominal pain. Lumbar spine CT showed multifocal osseous metastatic disease with moderate pathologic fracture of T11. Further work up with CT CAP showed a 6.9 cm cavitary RLL lesion, no lymphadenopathy or other mets      He underwent bronchoscopy on 11/09/21, pathology showed  adenocarcinoma in RLL and level 7 lymph node.  Thoracic spine MRI on 11/11/21 showed: numerous levels of vertebral metastatic disease, most notable at T11 and T12 where there is confluent body involvement and pathologic fracture.      Brain MRI showed no mets.  He presents today as an urgent referral to rad onc for palliative radiation.  PREVIOUS RADIATION THERAPY: No  Past Medical History:  Diagnosis Date   Arthritis    Barrett's esophagus 07/04/2016   pt states he was told he does not have this.   CAD (coronary artery disease), native coronary artery 05/2014   Non-ST elevation myocardial infarction. Echocardiogram showed normal LV systolic function with mild to moderate mitral regurgitation. Cardiac catheterization showed significant two-vessel coronary artery disease including the RCA and left anterior descending artery. He underwent CABG at [facility].   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   Sleep apnea 03/2019   on CPAP  :   Past Surgical History:  Procedure Laterality Date   BRONCHIAL BIOPSY  11/09/2021   Procedure: BRONCHIAL BIOPSIES;  Surgeon: Garner Nash, DO;  Location: [facility];  Service: Pulmonary;;   BRONCHIAL BRUSHINGS  11/09/2021   Procedure: BRONCHIAL BRUSHINGS;  Surgeon: Garner Nash, DO;  Location: [place];  Service: Pulmonary;;   BRONCHIAL NEEDLE ASPIRATION BIOPSY  11/09/2021   Procedure: BRONCHIAL NEEDLE ASPIRATION BIOPSIES;  Surgeon: Garner Nash, DO;  Location: [place];  Service: Pulmonary;;   CARDIAC CATHETERIZATION  03/2014   [facility]   cataract Right 1980   Dr. Katy Fitch   CORONARY ARTERY BYPASS GRAFT  05/2014   [facility], CABG x 2   EYE SURGERY     HEMORROIDECTOMY     LUNG BIOPSY  2006   [facility]   VIDEO BRONCHOSCOPY WITH ENDOBRONCHIAL ULTRASOUND  11/09/2021   Procedure: VIDEO BRONCHOSCOPY WITH ENDOBRONCHIAL ULTRASOUND;  Surgeon: Garner Nash, DO;  Location: [place] [facility];  Service: Pulmonary;;  :   Current Outpatient Medications:    acetaminophen (TYLENOL) 325 MG tablet, Take 2 tablets (650 mg total) by mouth every 6 (six) hours as needed for up to 30 doses for mild pain or moderate pain.,  Disp: 30 tablet, Rfl: 0   acetaminophen (TYLENOL) 500 MG tablet, Take 500 mg by mouth every 6 (six) hours as needed for moderate pain or headache., Disp: , Rfl:    amLODipine (NORVASC) 10 MG tablet, TAKE 1 TABLET BY MOUTH  DAILY (Patient taking differently: Take 10 mg by mouth daily.), Disp: 90 tablet, Rfl: 3   atorvastatin (LIPITOR) 20 MG tablet, TAKE 1 TABLET BY MOUTH ONCE DAILY (Patient taking differently: Take 20 mg by mouth daily.), Disp: 90 tablet, Rfl: 3   carvedilol (COREG) 6.25 MG tablet, TAKE 1 TABLET BY MOUTH  TWICE DAILY (Patient taking  differently: Take 6.25 mg by mouth 2 (two) times daily with a meal.), Disp: 180 tablet, Rfl: 3   clopidogrel (PLAVIX) 75 MG tablet, TAKE 1 TABLET BY MOUTH ONCE DAILY (Patient taking differently: Take 75 mg by mouth daily.), Disp: 90 tablet, Rfl: 1   Evolocumab (REPATHA SURECLICK) 741 MG/ML SOAJ, Inject 1 pen into the skin every 14 (fourteen) days. (Patient not taking: Reported on 10/31/2021), Disp: 6 mL, Rfl: 3   HYDROmorphone (DILAUDID) 2 MG tablet, Take 1 tablet (2 mg total) by mouth every 6 (six) hours as needed for up to 21 days for severe pain., Disp: 60 tablet, Rfl: 0   losartan (COZAAR) 100 MG tablet, TAKE 1 TABLET BY MOUTH  DAILY (Patient taking differently: Take 100 mg by mouth daily.), Disp: 90 tablet, Rfl: 3   morphine (MS CONTIN) 15 MG 12 hr tablet, Take 1 tablet (15 mg total) by mouth every 12 (twelve) hours., Disp: 30 tablet, Rfl: 0   omeprazole (PRILOSEC) 20 MG capsule, Take 1 capsule (20 mg total) by mouth 2 (two) times daily before a meal. (Patient taking differently: Take 20 mg by mouth daily.), Disp: 60 capsule, Rfl: 0   predniSONE (DELTASONE) 10 MG tablet, Take 10 mg by mouth See admin instructions. Take 1 tab TID for 2 days, 1 tab BID for 5 days, 1 tab daily until finished. (Patient not taking: Reported on 11/04/2021), Disp: , Rfl:    senna-docusate (SENOKOT-S) 8.6-50 MG tablet, Take 1 tablet by mouth daily for 30 doses., Disp: 30 tablet, Rfl: 0:   Allergies  Allergen Reactions   Plasma Protein Fraction Anaphylaxis    Whole body rash, hypotension intra-operatively during CABG, responded to benadryl and famotidine   Icosapent Ethyl     Joint pain   Morphine And Related Other (See Comments)    hallucanations  :   Family History  Problem Relation Age of Onset   Stroke Mother    Hypertension Mother    Heart Problems Mother    Diabetes Mother   :   Social History   Socioeconomic History   Marital status: Married    Spouse name: Not on file   Number of children: 2    Years of education: Not on file   Highest education level: High school graduate  Occupational History   Occupation: retired  Tobacco Use   Smoking status: Former    Types: Cigars    Quit date: 2015    Years since quitting: 8.0   Smokeless tobacco: Never  Vaping Use   Vaping Use: Never used  Substance and Sexual Activity   Alcohol use: Yes    Alcohol/week: 7.0 - 21.0 standard drinks    Types: 7 - 21 Cans of beer per week    Comment: beers 1-3 daily    Drug use: No   Sexual activity: Yes    Partners: Female  Other Topics Concern   Not on file  Social History Narrative   Lives at home with wife   Right handed   Caffeine: 1-2 cups of coffee per day   Social Determinants of Health   Financial Resource Strain: Not on file  Food Insecurity: Not on file  Transportation Needs: Not on file  Physical Activity: Not on file  Stress: Not on file  Social Connections: Not on file  Intimate Partner Violence: Not on file  :  REVIEW OF SYSTEMS:  A 15 point review of systems is documented in the electronic medical record. This was obtained by the nursing staff. However, I reviewed this with the patient to discuss relevant findings and make appropriate changes.  Pertinent items are noted in HPI.   PHYSICAL EXAM:  Patient was appropriate and in no acute distress today.  KPS = 80  100 - Normal; no complaints; no evidence of disease. 90   - Able to carry on normal activity; minor signs or symptoms of disease. 80   - Normal activity with effort; some signs or symptoms of disease. 59   - Cares for self; unable to carry on normal activity or to do active work. 60   - Requires occasional assistance, but is able to care for most of his personal needs. 50   - Requires considerable assistance and frequent medical care. 51   - Disabled; requires special care and assistance. 54   - Severely disabled; hospital admission is indicated although death not imminent. 108   - Very sick; hospital admission  necessary; active supportive treatment necessary. 10   - Moribund; fatal processes progressing rapidly. 0     - Dead  Karnofsky DA, Abelmann [place], Craver LS and [facility] [phone]) The use of the nitrogen mustards in the palliative treatment of carcinoma: with particular reference to bronchogenic carcinoma Cancer 1 634-56  LABORATORY DATA:  Lab Results  Component Value Date   WBC 9.4 11/09/2021   HGB 14.7 11/09/2021   HCT 44.2 11/09/2021   MCV 90.6 11/09/2021   PLT 304 11/09/2021   Lab Results  Component Value Date   NA 133 (L) 11/09/2021   K 4.9 11/09/2021   CL 97 (L) 11/09/2021   CO2 25 11/09/2021   Lab Results  Component Value Date   ALT 28 05/27/2021   AST 26 05/27/2021   ALKPHOS 69 05/27/2021   BILITOT 1.1 05/27/2021     RADIOGRAPHY: DG Chest 2 View  Result Date: 10/31/2021 CLINICAL DATA:  Upper abdominal pain. EXAM: CHEST - 2 VIEW COMPARISON:  07/12/2021. FINDINGS: The heart size and mediastinal contours are stable. Lung volumes are low and mild atelectasis is present at the left lung base. No effusion or pneumothorax. Sternotomy wires are noted over the midline. No acute osseous abnormality. IMPRESSION: Low lung volumes with atelectasis at the left lung base. Electronically Signed   By: Brett Fairy M.D.   On: 10/31/2021 01:15   CT Chest W Contrast  Result Date: 10/31/2021 CLINICAL DATA:  Cancer staging EXAM: CT CHEST WITH CONTRAST TECHNIQUE: Multidetector CT imaging of the chest was performed during intravenous contrast administration. CONTRAST:  80mL OMNIPAQUE IOHEXOL 300 MG/ML  SOLN COMPARISON:  CT angiogram abdomen and pelvis, 10/31/2021 FINDINGS: Cardiovascular: Aortic atherosclerosis. 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 Three-vessel coronary artery calcifications. No pericardial effusion. Mediastinum/Nodes: No enlarged mediastinal, hilar, or axillary lymph nodes. Small hiatal hernia. Thyroid gland, trachea, and esophagus demonstrate no significant  findings. Lungs/Pleura: There is a thick-walled  subpleural cavitary lesion of the dependent right lower lobe measuring 6.9 x 4.9 x 3.3 cm (series 4, image 107). Mild centrilobular emphysema. No pleural effusion or pneumothorax. Upper Abdomen: No acute abnormality. Musculoskeletal: No chest wall mass. Wedge deformity of T11 and superior endplate deformity of B51 with subtle underlying lytic character (series 6, image 80). Additional small lucent focus at the left aspect of T9 (series 3, image 100). IMPRESSION: 1. There is a thick-walled subpleural cavitary lesion of the dependent right lower lobe measuring 6.9 cm, highly concerning for primary lung malignancy. Cavitary infection is however a general differential consideration. 2. Wedge deformity of T11 and superior endplate deformity of W25 with subtle underlying lytic character, highly concerning for osseous metastatic disease and pathologic fracture. 3. Additional small lucent focus at the left aspect of T9, suspicious for an additional metastatic lesion. 4. No evidence of lymphadenopathy in the chest. 5. Emphysema. 6. Coronary artery disease. Aortic Atherosclerosis (ICD10-I70.0) and Emphysema (ICD10-J43.9). Electronically Signed   By: Delanna Ahmadi M.D.   On: 10/31/2021 13:19   MR Brain W Wo Contrast  Result Date: 11/12/2021 CLINICAL DATA:  Non-small cell lung cancer staging EXAM: MRI HEAD WITHOUT AND WITH CONTRAST TECHNIQUE: Multiplanar, multiecho pulse sequences of the brain and surrounding structures were obtained without and with intravenous contrast. CONTRAST:  66mL GADAVIST GADOBUTROL 1 MMOL/ML IV SOLN COMPARISON:  05/30/2020 FINDINGS: Brain: No swelling, infarction, hemorrhage, hydrocephalus, extra-axial collection or mass lesion. No abnormal enhancement to suggest metastatic disease. Mineralization at the left brachium pontis which is stable from August 2021. Brain volume is normal Vascular: Normal flow voids and vascular enhancements Skull and upper  cervical spine: Normal marrow signal Sinuses/Orbits: Right cataract resection. IMPRESSION: Negative for metastatic disease. Electronically Signed   By: Jorje Guild M.D.   On: 11/12/2021 18:17   MR THORACIC SPINE W WO CONTRAST  Result Date: 11/12/2021 CLINICAL DATA:  Non-small cell lung cancer staging. Severe upper back pain for 2 months EXAM: MRI THORACIC WITHOUT AND WITH CONTRAST TECHNIQUE: Multiplanar and multiecho pulse sequences of the thoracic spine were obtained without and with intravenous contrast. CONTRAST:  32mL GADAVIST GADOBUTROL 1 MMOL/ML IV SOLN COMPARISON:  Chest and lumbar CT from 10/31/2021 FINDINGS: Alignment:  Negative for listhesis Vertebrae: Numerous infiltrative bone lesions including: *T2 spinous process. *T5 right superior body *T6 body. This metastasis is diffuse without compression fracture or extraosseous extension. Smaller midline metastasis in the spinous process at this level. *T7 right articular process *T8 right body *Multifocal in the T9 body and right posterior element *Multifocal in the T10 body *T11 vertebra with growth throughout the body and into the posterior elements. Compression fracture with fluid-filled cleft and 60% height loss. Ventral epidural tumor at this level which contacts but does not compress the cord *T12 body which is diffuse and associated with small superior endplate fracture. *L1 left posterior body. Cord: Normal signal. Accentuated surface vessels without definite fistula. No evidence of intrathecal tumor. Paraspinal and other soft tissues: Reported separately Disc levels: No significant degenerative changes for age. No degenerative impingement IMPRESSION: Numerous levels of vertebral metastatic disease (listed above) most notable at T11 and T12 where there is confluent body involvement and pathologic fracture. Body fracture at T11 causes 60% height loss and retropulsion with ventral epidural tumor contacting but not compressing the cord.  Electronically Signed   By: Jorje Guild M.D.   On: 11/12/2021 08:51   CT L-SPINE NO CHARGE  Result Date: 10/31/2021 CLINICAL DATA:  71 year old male with epigastric pain radiating  to the back. Query aortic aneurysm. EXAM: CT LUMBAR SPINE WITH CONTRAST TECHNIQUE: Technique: Multiplanar CT images of the lumbar spine were reconstructed from contemporary CTA of the Abdomen and Pelvis. CONTRAST:  No additional COMPARISON:  CTA abdomen and Pelvis today reported separately. FINDINGS: Segmentation: Normal. Alignment: Relatively preserved lumbar lordosis. No spondylolisthesis. Vertebrae: Heterogeneous osteolysis in the T11 vertebral body which is moderately compressed with superior endplate deformity (series 2, image 49) and demonstrates indistinct posterior extension of soft tissue into the epidural space at that level. Questionable abnormal bone mineralization also at T10. And similar indistinct osteolysis of T12 with mild superior endplate deformity. L1 through L3 appear intact with no definite bone lesion. But there is a round osteolytic lesion at the anterior L4 body. No pathologic fracture. And similar heterogeneous lucency in the L5 body. No pathologic fracture. Visible sacrum and SI joints appear intact. No sclerotic bone lesion. Paraspinal and other soft tissues: Abnormal lower thoracic paraspinal soft tissues most suspicious for extraosseous extension of tumor. Lumbar paraspinal soft tissues remain within normal limits. Abdominal and pelvic viscera are reported separately. Disc levels: Superimposed lumbar spine degeneration primarily at L4-L5 and L5-S1. But only mild associated degenerative spinal stenosis. IMPRESSION: 1. 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 Moderate pathologic fracture of T11, with suspected epidural and extraosseous extension of tumor there. Consider encroachment on the lower thoracic spinal cord, MRI would be confirmatory. Mild pathologic fracture of T12. L4 and  L5 vertebral metastases also suspected. 2. See also CTA abdomen and Pelvis today reported separately. 3. Lumbar spine degeneration with only mild degenerative spinal stenosis. Electronically Signed   By: Genevie Ann M.D.   On: 10/31/2021 10:53   DG Chest Port 1 View  Result Date: 11/09/2021 CLINICAL DATA:  Status post bronchoscopy and biopsy EXAM: PORTABLE CHEST 1 VIEW COMPARISON:  10/31/2021 FINDINGS: Cardiomegaly status post median sternotomy. Cavitary lesion of the posterior right lower lobe is poorly appreciated on frontal chest radiograph. No pneumothorax. The visualized skeletal structures are unremarkable. IMPRESSION: 1. Cardiomegaly status post median sternotomy. 2. Cavitary lesion of the posterior right lower lobe is poorly appreciated on frontal chest radiograph. No pneumothorax following bronchoscopic biopsy. Electronically Signed   By: Delanna Ahmadi M.D.   On: 11/09/2021 15:30   CT Angio Abd/Pel w/ and/or w/o  Result Date: 10/31/2021 CLINICAL DATA:  Aortic aneurysm with pain radiating into the back EXAM: CTA ABDOMEN AND PELVIS WITHOUT AND WITH CONTRAST TECHNIQUE: Multidetector CT imaging of the abdomen and pelvis was performed using the standard protocol during bolus administration of intravenous contrast. Multiplanar reconstructed images and MIPs were obtained and reviewed to evaluate the vascular anatomy. CONTRAST:  164mL OMNIPAQUE IOHEXOL 350 MG/ML SOLN COMPARISON:  None. FINDINGS: VASCULAR Aorta: The aorta is normal in caliber. Mild atherosclerotic calcifications. No aneurysm or dissection. Celiac: Patent without evidence of aneurysm, dissection, vasculitis or significant stenosis. SMA: Patent without evidence of aneurysm, dissection, vasculitis or significant stenosis. Renals: Both renal arteries are patent without evidence of aneurysm, dissection, vasculitis, fibromuscular dysplasia or significant stenosis. IMA: Patent without evidence of aneurysm, dissection, vasculitis or significant stenosis.  Inflow: Patent without evidence of aneurysm, dissection, vasculitis or significant stenosis. Proximal Outflow: Bilateral common femoral and visualized portions of the superficial and profunda femoral arteries are patent without evidence of aneurysm, dissection, vasculitis or significant stenosis. Veins: No focal venous abnormality. Review of the MIP images confirms the above findings. NON-VASCULAR Lower chest: Cavitary subpleural posterior right lower lobe mass measuring approximately 7.4 x 4.2 by 2.9  cm. There appears to be slight extension through the pleura and into the subpleural fat (image 24 series 5) but no involvement of the overlying ninth rib. Atelectasis versus scarring present in the lingula. Mild bronchial wall thickening. Patient is status post median sternotomy. The heart is normal in size. No pericardial effusion. Moderately large sliding hiatal hernia. Hepatobiliary: No focal liver abnormality is seen. No gallstones, gallbladder wall thickening, or biliary dilatation. Pancreas: Unremarkable. No pancreatic ductal dilatation or surrounding inflammatory changes. Spleen: Normal in size without focal abnormality. Adrenals/Urinary Tract: Adrenal glands are unremarkable. Kidneys are normal, without renal calculi, focal lesion, or hydronephrosis. Bladder is unremarkable. 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 No evidence of obstruction or focal bowel wall thickening. The appendix is not visualized and may be surgically absent. The terminal ileum is unremarkable. Lymphatic: No suspicious lymphadenopathy. Reproductive: Prostate is unremarkable. Other: No abdominal wall hernia or abnormality. No abdominopelvic ascites. Musculoskeletal: Acute compression fracture at T11 with approximately 40% height loss. There is mottled decreased attenuation throughout the vertebral body concerning for metastatic disease. Additionally, mottled decreased echogenicity with a  mildly in appearance present throughout the T12 vertebral body. Slight defect in the superior endplate also concerning for an acute pathologic fracture. No significant height loss. Approximately 1.5 cm lucency in the anterior superior aspect of L4 concerning for an additional lytic lesion. Questionable lucency as well within the vertebral body at T9, possible additional focus of metastatic disease. Lucency underlying the superior endplate at L5 consistent with additional metastatic disease. Incidentally, there is also degenerative disc disease at L4-L5, L5-S1 and facet arthropathy bilaterally at 4 5 and 5 1. IMPRESSION: VASCULAR 1. Negative for abdominal aortic aneurysm, dissection or other acute vascular abnormality. 2. Aortic Atherosclerosis (ICD10-I70.0). NON-VASCULAR 1. Unfortunately, CT findings are highly concerning for a centrally necrotic 7.4 cm primary bronchogenic carcinoma in the posterior right lower lobe with multifocal osseous metastatic disease including likely pathologic fractures at T11 and T12. Given central necrosis, the primary differential consideration is squamous cell carcinoma. Recommend referral to multi disciplinary thoracic tumor board for further evaluation and tissue diagnosis. 2. Pathologic T11 compression fracture with approximately 40% height loss. 3. Likely subtle pathologic fracture involving the superior endplate of J88 without height loss. 4. Multifocal osseous metastatic disease including at least T9, T11, T12, L4 and L5. 5. No solid organ metastasis identified within the abdomen or pelvis. 6. 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 Electronically Signed   By: Jacqulynn Cadet M.D.   On: 10/31/2021 10:59   DG C-ARM BRONCHOSCOPY  Result Date: 11/09/2021 C-ARM BRONCHOSCOPY: Fluoroscopy was utilized by the requesting physician.  No radiographic interpretation.      IMPRESSION: 71 yo man with painful T11-T12 spinal metastases from non-small  cell carcinoma of the right lower lung - Stage IV - He may benefit from palliative radiotherapy to the spine to reduce pain and preserve spinal cord function.  PLAN:Today, I talked to the patient and family about the findings and work-up thus far.  We discussed the natural history of spinal metastases and general treatment, highlighting the role of radiotherapy in the management.  We discussed the available radiation techniques, and focused on the details of logistics and delivery.  We reviewed the anticipated acute and late sequelae associated with radiation in this setting.  The patient was encouraged to ask questions that I answered to the best of my ability.    The patient would like to proceed with radiation and will be  scheduled for CT simulation today at 1 pm and start radiation tomorrow.  I personally spent 30 minutes in this encounter including chart review, reviewing radiological studies, and tlking by phone with the patient, entering orders and completing documentation.   This visit was conducted via telephone to spare the patient unnecessary potential exposure in the healthcare setting during the current COVID-19 pandemic.   Given current concerns for patient exposure during the COVID-19 pandemic, this encounter was conducted via telephone. The patient was notified in advance and was offered a [place] meeting to allow for face to face communication but unfortunately reported that he/she did not have the appropriate resources/technology to support such a visit and instead preferred to proceed with telephone consult. The patient has given verbal consent for this type of encounter. The time spent during this encounter was 30 minutes. The attendants for this meeting include Tyler Pita MD and the patient. During the encounter, Tyler Pita MD was located at [facility] Radiation Oncology Department.  Patient was located at  home. ------------------------------------------------   Tyler Pita, MD [place]: [phone]   Fax: [phone] [email or web address]   Skype   LinkedIn

## 2021-11-15 NOTE — Progress Notes (Signed)
°  Radiation Oncology         [phone] ________________________________  Name: Carlos Erickson MRN: [id number]  Date: 11/15/2021  DOB: 26-Apr-1951  SIMULATION AND TREATMENT PLANNING NOTE    ICD-10-CM   1. Bone metastasis ([place])  C79.51     2. Primary cancer of right lower lobe of lung ([facility])  C34.31     3. 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  Identity was confirmed.  All relevant records and images related to the planned course of therapy were reviewed.  The patient freely provided informed written consent to proceed with treatment after reviewing the details related to the planned course of therapy. The consent form was witnessed and verified by the simulation staff.  Then, the patient was set-up in a stable reproducible  supine position for radiation therapy.  CT images were obtained.  Surface markings were placed.  The CT images were loaded into the planning software.  Then the target and avoidance structures were contoured.  Treatment planning then occurred.  The radiation prescription was entered and confirmed.  Then, I designed and supervised the construction of a total of multiple medically necessary complex treatment devices including MLCs to shield lungs and heart.  I have requested : 3D Simulation  I have requested a DVH of the following structures: left lung, right lung, heart, left kidney and right kidney.  PLAN:  The patient will receive 30 Gy in 10 fractions to the T11, T12 and primary tumor sites.  ________________________________  Sheral Apley Tammi Klippel, M.D.

## 2021-11-15 NOTE — Progress Notes (Signed)
Histology and Location of Primary Cancer: RLL Lung  Sites of Visceral and Bony Metastatic Disease: T11 and T12  Location(s) of Symptomatic Metastases: Lumbar spine CT showed multifocal osseous metastatic disease with moderate pathologic fracture of T11. Further work up with CT CAP showed a 6.9 cm cavitary RLL lesion, no lymphadenopathy or other mets   -he underwent bronchoscopy on 11/09/21, pathology showed adenocarcinoma in RLL and level 7 lymph node. -thoracic spine MRI on 11/11/21 showed: numerous levels of vertebral metastatic disease, most notable at T11 and T12 where there is confluent body involvement and pathologic fracture.   Past/Anticipated chemotherapy by medical oncology, if any:   PLAN: -referral to rad onc for palliative treatment and IR for kyphoplasty -palliative care referral to be seen next week  -I called in MS contin and dilaudid  -lab and f/u in about 2 weeks to finalize systemic therapy   Pain on a scale of 0-10 is: Pain in back 8/10 at times higher before taking medications.    If Spine Met(s), symptoms, if any, include: Bowel/Bladder retention or incontinence (please describe): Constipation issues due to pain medications is taking stool softeners/laxatives.  Bladder at times difficult emptying bladder. Numbness or weakness in extremities (please describe): Numbness in toes per patient at this time. Current Decadron regimen, if applicable: No  Ambulatory status? Walker? Wheelchair?: Walker at times wheelchair.  SAFETY ISSUES: Prior radiation? No Pacemaker/ICD? no Possible current pregnancy? Male Is the patient on methotrexate? No  Current Complaints / other details:  Concerns about treatment for lung and back.

## 2021-11-16 ENCOUNTER — Ambulatory Visit
Admission: RE | Admit: 2021-11-16 | Discharge: 2021-11-16 | Disposition: A | Discharge status: Home / Self Care | Payer: Medicare Other | Source: Ambulatory Visit | Attending: Radiation Oncology | Admitting: Radiation Oncology

## 2021-11-16 ENCOUNTER — Telehealth: Payer: Self-pay | Admitting: Hematology

## 2021-11-16 ENCOUNTER — Inpatient Hospital Stay: Payer: Medicare Other | Admitting: Nurse Practitioner

## 2021-11-16 DIAGNOSIS — K5900 Constipation, unspecified: Secondary | ICD-10-CM | POA: Diagnosis not present

## 2021-11-16 DIAGNOSIS — C7951 Secondary malignant neoplasm of bone: Secondary | ICD-10-CM | POA: Diagnosis not present

## 2021-11-16 DIAGNOSIS — C3431 Malignant neoplasm of lower lobe, right bronchus or lung: Secondary | ICD-10-CM | POA: Diagnosis not present

## 2021-11-16 DIAGNOSIS — Z510 Encounter for antineoplastic radiation therapy: Secondary | ICD-10-CM | POA: Diagnosis not present

## 2021-11-16 DIAGNOSIS — G893 Neoplasm related pain (acute) (chronic): Secondary | ICD-10-CM | POA: Diagnosis not present

## 2021-11-16 NOTE — Telephone Encounter (Signed)
Scheduled follow-up appointment per 1/20 los. Patient is aware.

## 2021-11-17 ENCOUNTER — Encounter: Payer: Self-pay | Admitting: Nurse Practitioner

## 2021-11-17 ENCOUNTER — Telehealth: Payer: Self-pay

## 2021-11-17 ENCOUNTER — Ambulatory Visit: Payer: Medicare Other | Admitting: Acute Care

## 2021-11-17 ENCOUNTER — Inpatient Hospital Stay (HOSPITAL_BASED_OUTPATIENT_CLINIC_OR_DEPARTMENT_OTHER): Payer: Medicare Other | Admitting: Nurse Practitioner

## 2021-11-17 ENCOUNTER — Ambulatory Visit
Admission: RE | Admit: 2021-11-17 | Discharge: 2021-11-17 | Disposition: A | Discharge status: Home / Self Care | Payer: Medicare Other | Source: Ambulatory Visit | Attending: Radiation Oncology | Admitting: Radiation Oncology

## 2021-11-17 ENCOUNTER — Other Ambulatory Visit: Payer: Self-pay

## 2021-11-17 ENCOUNTER — Ambulatory Visit: Payer: Medicare Other

## 2021-11-17 VITALS — BP 131/83 | HR 77 | Temp 97.9°F | Resp 19 | Ht 68.0 in | Wt 180.2 lb

## 2021-11-17 DIAGNOSIS — C7951 Secondary malignant neoplasm of bone: Secondary | ICD-10-CM | POA: Diagnosis not present

## 2021-11-17 DIAGNOSIS — G893 Neoplasm related pain (acute) (chronic): Secondary | ICD-10-CM

## 2021-11-17 DIAGNOSIS — Z515 Encounter for palliative care: Secondary | ICD-10-CM | POA: Diagnosis not present

## 2021-11-17 DIAGNOSIS — Z510 Encounter for antineoplastic radiation therapy: Secondary | ICD-10-CM | POA: Diagnosis not present

## 2021-11-17 DIAGNOSIS — R530 Neoplastic (malignant) related fatigue: Secondary | ICD-10-CM | POA: Diagnosis not present

## 2021-11-17 DIAGNOSIS — K5900 Constipation, unspecified: Secondary | ICD-10-CM

## 2021-11-17 DIAGNOSIS — R634 Abnormal weight loss: Secondary | ICD-10-CM | POA: Diagnosis not present

## 2021-11-17 DIAGNOSIS — C3490 Malignant neoplasm of unspecified part of unspecified bronchus or lung: Secondary | ICD-10-CM

## 2021-11-17 DIAGNOSIS — C3431 Malignant neoplasm of lower lobe, right bronchus or lung: Secondary | ICD-10-CM | POA: Diagnosis not present

## 2021-11-17 MED ORDER — HYDROMORPHONE HCL 2 MG PO TABS: 2.0000 mg | ORAL_TABLET | ORAL | 0 refills | Status: AC | PRN

## 2021-11-17 MED ORDER — POLYETHYLENE GLYCOL 3350 17 GM/SCOOP PO POWD: 1.0000 | Freq: Once | ORAL | 0 refills | Status: AC

## 2021-11-17 NOTE — Telephone Encounter (Signed)
I called Carlos Erickson's pharmacy to inquire about his Morphine prescription. They said that they needed documentation on why he was taking both Dilaudid and MS Contin. I explained that the MS Contin is long acting and the Dilaudid is for break through pain. She stated she would note this and fill the prescription. Attempted to notify Carlos Erickson.

## 2021-11-17 NOTE — Progress Notes (Signed)
[facility]  Telephone:([phone]) [phone] Fax:(336) [phone]   Name: Carlos Erickson Date: 11/17/2021 MRN: [id number]  DOB: 1951-06-12  Patient Care Team: Owens Loffler, MD as PCP - General (Family Medicine) Wellington Hampshire, MD as PCP - Cardiology (Cardiology) Valrie Hart, RN as Oncology Nurse Navigator (Oncology) Pickenpack-Cousar, Carlena Sax, NP as Nurse Practitioner (Nurse Practitioner)    REASON FOR CONSULTATION: Carlos Erickson is a 71 y.o. 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  Palliative ask to see for symptom management.    SOCIAL HISTORY:     reports that he quit smoking about 8 years ago. His smoking use included cigars. He has never used smokeless tobacco. He reports current alcohol use of about 7.0 - 21.0 standard drinks per week. He reports that he does not use drugs.  ADVANCE DIRECTIVES:  None on file  CODE STATUS:   PAST MEDICAL HISTORY: Past Medical History:  Diagnosis Date   Arthritis    Barrett's esophagus 07/04/2016   pt states he was told he does not have this.   CAD (coronary artery disease), native coronary artery 05/2014   Non-ST elevation myocardial infarction. Echocardiogram showed normal LV systolic function with mild to moderate mitral regurgitation. Cardiac catheterization showed significant two-vessel coronary artery disease including the RCA and left anterior descending artery. He underwent CABG at [facility].   Colon polyps    GERD (gastroesophageal reflux disease)    Hyperlipidemia    Hypertension    MI (myocardial infarction) ([place])    Past heart attack, 05/11/2014 07/09/2014   Prediabetes    S/P CABG x 2 07/09/2014   Sleep apnea 03/2019   on CPAP    PAST SURGICAL HISTORY:  Past Surgical History:  Procedure Laterality Date   BRONCHIAL BIOPSY  11/09/2021    Procedure: BRONCHIAL BIOPSIES;  Surgeon: Garner Nash, DO;  Location: [place] [facility];  Service: Pulmonary;;   BRONCHIAL BRUSHINGS  11/09/2021   Procedure: BRONCHIAL BRUSHINGS;  Surgeon: Garner Nash, DO;  Location: [place] [facility];  Service: Pulmonary;;   BRONCHIAL NEEDLE ASPIRATION BIOPSY  11/09/2021   Procedure: BRONCHIAL NEEDLE ASPIRATION BIOPSIES;  Surgeon: Garner Nash, DO;  Location: [place];  Service: Pulmonary;;   CARDIAC CATHETERIZATION  03/2014   [facility]   cataract Right 1980   Dr. Katy Fitch   CORONARY ARTERY BYPASS GRAFT  05/2014   [facility], CABG x 2   EYE SURGERY     HEMORROIDECTOMY     LUNG BIOPSY  2006   [facility]   VIDEO BRONCHOSCOPY WITH ENDOBRONCHIAL ULTRASOUND  11/09/2021   Procedure: VIDEO BRONCHOSCOPY WITH ENDOBRONCHIAL ULTRASOUND;  Surgeon: Garner Nash, DO;  Location: [place];  Service: Pulmonary;;    HEMATOLOGY/ONCOLOGY HISTORY:  Oncology History  Bone metastasis ([place])   Initial Diagnosis   Bone metastasis ([place])   10/31/2021 Imaging   EXAM: CT LUMBAR SPINE WITH CONTRAST  IMPRESSION: 1. 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 Moderate pathologic fracture of T11, with suspected epidural and extraosseous extension of tumor there. Consider encroachment on the lower thoracic spinal cord, MRI would be confirmatory. Mild pathologic fracture of T12. L4 and L5 vertebral metastases also suspected.   2. See also CTA abdomen and Pelvis today reported separately.   3. Lumbar spine degeneration with only mild degenerative spinal stenosis.   10/31/2021 Imaging   EXAM: CTA ABDOMEN AND  PELVIS WITHOUT AND WITH CONTRAST  IMPRESSION: VASCULAR   1. Negative for abdominal aortic aneurysm, dissection or other acute vascular abnormality. 2. Aortic Atherosclerosis (ICD10-I70.0).   NON-VASCULAR   1. Unfortunately, CT findings are highly concerning for a centrally necrotic 7.4 cm primary bronchogenic carcinoma in the posterior right lower lobe  with multifocal osseous metastatic disease including likely pathologic fractures at T11 and T12. Given central necrosis, the primary differential consideration is squamous cell carcinoma. Recommend referral to multi disciplinary thoracic tumor board for further evaluation and tissue diagnosis. 2. Pathologic T11 compression fracture with approximately 40% height loss. 3. Likely subtle pathologic fracture involving the superior endplate of X91 without height loss. 4. Multifocal osseous metastatic disease including at least T9, T11, T12, L4 and L5. 5. No solid organ metastasis identified within the abdomen or pelvis. 6. 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   10/31/2021 Imaging   EXAM: CT CHEST WITH CONTRAST  IMPRESSION: 1. There is a thick-walled subpleural cavitary lesion of the dependent right lower lobe measuring 6.9 cm, highly concerning for primary lung malignancy. Cavitary infection is however a general differential consideration. 2. Wedge deformity of T11 and superior endplate deformity of Y78 with subtle underlying lytic character, highly concerning for osseous metastatic disease and pathologic fracture. 3. Additional small lucent focus at the left aspect of T9, suspicious for an additional metastatic lesion. 4. No evidence of lymphadenopathy in the chest. 5. Emphysema. 6. Coronary artery disease.   Aortic Atherosclerosis (ICD10-I70.0) and Emphysema (ICD10-J43.9).   11/09/2021 Initial Biopsy   FINAL MICROSCOPIC DIAGNOSIS:  A. LUNG, RLL, FINE NEEDLE ASPIRATION AND BIOPSIES:  - Adenocarcinoma.  See comment.  B. LUNG, RLL, BRUSH:  - Adenocarcinoma.   FINAL MICROSCOPIC DIAGNOSIS:  C. LYMPH NODE, 7, FINE NEEDLE ASPIRATION:  - Adenocarcinoma.  COMMENT:  The cells stain positively for cytokeratin 7 and cytokeratin 20, and  have patchy positivity for CDX2.  TTF-1 and p40 immunostains are negative.  The differential diagnosis includes primary  pulmonary adenocarcinoma as well as a GI primary.   11/11/2021 Imaging   EXAM: MRI THORACIC WITHOUT AND WITH CONTRAST  IMPRESSION: Numerous levels of vertebral metastatic disease (listed above) most notable at T11 and T12 where there is confluent body involvement and pathologic fracture. Body fracture at T11 causes 60% height loss and retropulsion with ventral epidural tumor contacting but not compressing the cord.   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  MEDICATIONS:  Current Outpatient Medications  Medication Sig Dispense Refill   polyethylene glycol powder (MIRALAX) 17 GM/SCOOP powder Take 255 g by mouth once for 1 dose. 255 g 0   acetaminophen (TYLENOL) 325 MG tablet Take 2 tablets (650 mg total) by mouth every 6 (six) hours as needed for up to 30 doses for mild pain or moderate pain. 30 tablet 0   acetaminophen (TYLENOL) 500 MG tablet Take 500 mg by mouth every 6 (six) hours as needed for moderate pain or headache.     amLODipine (NORVASC) 10 MG tablet TAKE 1 TABLET BY MOUTH  DAILY (  Patient taking differently: Take 10 mg by mouth daily.) 90 tablet 3   atorvastatin (LIPITOR) 20 MG tablet TAKE 1 TABLET BY MOUTH ONCE DAILY (Patient taking differently: Take 20 mg by mouth daily.) 90 tablet 3   carvedilol (COREG) 6.25 MG tablet TAKE 1 TABLET BY MOUTH  TWICE DAILY (Patient taking differently: Take 6.25 mg by mouth 2 (two) times daily with a meal.) 180 tablet 3   clopidogrel (PLAVIX) 75 MG tablet TAKE 1 TABLET BY MOUTH ONCE DAILY (Patient taking  differently: Take 75 mg by mouth daily.) 90 tablet 1   Evolocumab (REPATHA SURECLICK) 644 MG/ML SOAJ Inject 1 pen into the skin every 14 (fourteen) days. (Patient not taking: Reported on 10/31/2021) 6 mL 3   HYDROmorphone (DILAUDID) 2 MG tablet Take 1 tablet (2 mg total) by mouth every 4 (four) hours as needed for up to 21 days for severe pain or moderate pain. 60 tablet 0   losartan (COZAAR) 100 MG tablet TAKE 1 TABLET BY MOUTH  DAILY (Patient taking differently: Take 100 mg by mouth daily.) 90 tablet 3   morphine (MS CONTIN) 15 MG 12 hr tablet Take 1 tablet (15 mg total) by mouth every 12 (twelve) hours. 30 tablet 0   omeprazole (PRILOSEC) 20 MG capsule Take 1 capsule (20 mg total) by mouth 2 (two) times daily before a meal. (Patient taking differently: Take 20 mg by mouth daily.) 60 capsule 0   predniSONE (DELTASONE) 10 MG tablet Take 10 mg by mouth See admin instructions. Take 1 tab TID for 2 days, 1 tab BID for 5 days, 1 tab daily until finished. (Patient not taking: Reported on 11/04/2021)     senna-docusate (SENOKOT-S) 8.6-50 MG tablet Take 1 tablet by mouth daily for 30 doses. 30 tablet 0   No current facility-administered medications for this visit.    VITAL SIGNS: BP 131/83 (BP Location: Left Arm, Patient Position: Sitting)    Pulse 77    Temp 97.9 F (36.6 C) (Oral)    Resp 19    Ht 5\' 8"  (1.727 m)    Wt 180 lb 3.2 oz (81.7 kg)    SpO2 93%    BMI 27.40 kg/m  Filed Weights   11/17/21 1423  Weight: 180 lb 3.2 oz (81.7 kg)    Estimated body mass index is 27.4 kg/m as calculated from the following:   Height as of this encounter: 5\' 8"  (1.727 m).   Weight as of this encounter: 180 lb 3.2 oz (81.7 kg).  LABS: CBC:    Component Value Date/Time   WBC 9.4 11/09/2021 1239   HGB 14.7 11/09/2021 1239   HGB 16.1 11/28/2019 1349   HCT 44.2 11/09/2021 1239   HCT 45.5 11/28/2019 1349   PLT 304 11/09/2021 1239   PLT 261 11/28/2019 1349   MCV 90.6 11/09/2021 1239   MCV 90 11/28/2019 1349    MCV 92 05/11/2014 1924   NEUTROABS 3.9 11/28/2019 1349   LYMPHSABS 2.4 11/28/2019 1349   MONOABS 0.8 07/05/2018 0802   EOSABS 0.4 11/28/2019 1349   BASOSABS 0.1 11/28/2019 1349   Comprehensive Metabolic Panel:    Component Value Date/Time   NA 133 (L) 11/09/2021 1239   NA 137 11/28/2019 1349   NA 138 05/12/2014 1234   K 4.9 11/09/2021 1239   K 3.3 (L) 05/12/2014 1234   CL 97 (L) 11/09/2021 1239   CL 102 05/12/2014 1234   CO2 25 11/09/2021 1239   CO2 28 05/12/2014 1234  BUN 14 11/09/2021 1239   BUN 16 11/28/2019 1349   BUN 21 (H) 05/12/2014 1234   CREATININE 0.90 11/09/2021 1239   CREATININE 0.94 05/12/2014 1234   GLUCOSE 109 (H) 11/09/2021 1239   GLUCOSE 108 (H) 05/12/2014 1234   CALCIUM 9.1 11/09/2021 1239   CALCIUM 8.3 (L) 05/12/2014 1234   AST 26 05/27/2021 1151   ALT 28 05/27/2021 1151   ALKPHOS 69 05/27/2021 1151   BILITOT 1.1 05/27/2021 1151   BILITOT 0.9 11/28/2019 1349   PROT 7.3 05/27/2021 1151   PROT 7.7 03/12/2020 1447   ALBUMIN 4.2 05/27/2021 1151   ALBUMIN 4.6 11/28/2019 1349    RADIOGRAPHIC STUDIES: DG Chest 2 View  Result Date: 10/31/2021 CLINICAL DATA:  Upper abdominal pain. EXAM: CHEST - 2 VIEW COMPARISON:  07/12/2021. FINDINGS: The heart size and mediastinal contours are stable. Lung volumes are low and mild atelectasis is present at the left lung base. No effusion or pneumothorax. Sternotomy wires are noted over the midline. No acute osseous abnormality. IMPRESSION: Low lung volumes with atelectasis at the left lung base. Electronically Signed   By: Brett Fairy M.D.   On: 10/31/2021 01:15   CT Chest W Contrast  Result Date: 10/31/2021 CLINICAL DATA:  Cancer staging EXAM: CT CHEST WITH CONTRAST TECHNIQUE: Multidetector CT imaging of the chest was performed during intravenous contrast administration. CONTRAST:  47mL OMNIPAQUE IOHEXOL 300 MG/ML  SOLN COMPARISON:  CT angiogram abdomen and pelvis, 10/31/2021 FINDINGS: Cardiovascular: Aortic  atherosclerosis. 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 Three-vessel coronary artery calcifications. No pericardial effusion. Mediastinum/Nodes: No enlarged mediastinal, hilar, or axillary lymph nodes. Small hiatal hernia. Thyroid gland, trachea, and esophagus demonstrate no significant findings. Lungs/Pleura: There is a thick-walled subpleural cavitary lesion of the dependent right lower lobe measuring 6.9 x 4.9 x 3.3 cm (series 4, image 107). Mild centrilobular emphysema. No pleural effusion or pneumothorax. Upper Abdomen: No acute abnormality. Musculoskeletal: No chest wall mass. Wedge deformity of T11 and superior endplate deformity of V67 with subtle underlying lytic character (series 6, image 80). Additional small lucent focus at the left aspect of T9 (series 3, image 100). IMPRESSION: 1. There is a thick-walled subpleural cavitary lesion of the dependent right lower lobe measuring 6.9 cm, highly concerning for primary lung malignancy. Cavitary infection is however a general differential consideration. 2. Wedge deformity of T11 and superior endplate deformity of M09 with subtle underlying lytic character, highly concerning for osseous metastatic disease and pathologic fracture. 3. Additional small lucent focus at the left aspect of T9, suspicious for an additional metastatic lesion. 4. No evidence of lymphadenopathy in the chest. 5. Emphysema. 6. Coronary artery disease. Aortic Atherosclerosis (ICD10-I70.0) and Emphysema (ICD10-J43.9). Electronically Signed   By: Delanna Ahmadi M.D.   On: 10/31/2021 13:19   MR Brain W Wo Contrast  Result Date: 11/12/2021 CLINICAL DATA:  Non-small cell lung cancer staging EXAM: MRI HEAD WITHOUT AND WITH CONTRAST TECHNIQUE: Multiplanar, multiecho pulse sequences of the brain and surrounding structures were obtained without and with intravenous contrast. CONTRAST:  45mL GADAVIST GADOBUTROL 1 MMOL/ML IV SOLN COMPARISON:  05/30/2020 FINDINGS: Brain: No swelling,  infarction, hemorrhage, hydrocephalus, extra-axial collection or mass lesion. No abnormal enhancement to suggest metastatic disease. Mineralization at the left brachium pontis which is stable from August 2021. Brain volume is normal Vascular: Normal flow voids and vascular enhancements Skull and upper cervical spine: Normal marrow signal Sinuses/Orbits: Right cataract resection. IMPRESSION: Negative for metastatic disease. Electronically Signed   By: Gilford Silvius.D.  On: 11/12/2021 18:17   MR THORACIC SPINE W WO CONTRAST  Result Date: 11/12/2021 CLINICAL DATA:  Non-small cell lung cancer staging. Severe upper back pain for 2 months EXAM: MRI THORACIC WITHOUT AND WITH CONTRAST TECHNIQUE: Multiplanar and multiecho pulse sequences of the thoracic spine were obtained without and with intravenous contrast. CONTRAST:  57mL GADAVIST GADOBUTROL 1 MMOL/ML IV SOLN COMPARISON:  Chest and lumbar CT from 10/31/2021 FINDINGS: Alignment:  Negative for listhesis Vertebrae: Numerous infiltrative bone lesions including: *T2 spinous process. *T5 right superior body *T6 body. This metastasis is diffuse without compression fracture or extraosseous extension. Smaller midline metastasis in the spinous process at this level. *T7 right articular process *T8 right body *Multifocal in the T9 body and right posterior element *Multifocal in the T10 body *T11 vertebra with growth throughout the body and into the posterior elements. Compression fracture with fluid-filled cleft and 60% height loss. Ventral epidural tumor at this level which contacts but does not compress the cord *T12 body which is diffuse and associated with small superior endplate fracture. *L1 left posterior body. Cord: Normal signal. Accentuated surface vessels without definite fistula. No evidence of intrathecal tumor. Paraspinal and other soft tissues: Reported separately Disc levels: No significant degenerative changes for age. No degenerative impingement  IMPRESSION: Numerous levels of vertebral metastatic disease (listed above) most notable at T11 and T12 where there is confluent body involvement and pathologic fracture. Body fracture at T11 causes 60% height loss and retropulsion with ventral epidural tumor contacting but not compressing the cord. Electronically Signed   By: Jorje Guild M.D.   On: 11/12/2021 08:51   CT L-SPINE NO CHARGE  Result Date: 10/31/2021 CLINICAL DATA:  71 year old male with epigastric pain radiating to the back. Query aortic aneurysm. EXAM: CT LUMBAR SPINE WITH CONTRAST TECHNIQUE: Technique: Multiplanar CT images of the lumbar spine were reconstructed from contemporary CTA of the Abdomen and Pelvis. CONTRAST:  No additional COMPARISON:  CTA abdomen and Pelvis today reported separately. FINDINGS: Segmentation: Normal. Alignment: Relatively preserved lumbar lordosis. No spondylolisthesis. Vertebrae: Heterogeneous osteolysis in the T11 vertebral body which is moderately compressed with superior endplate deformity (series 2, image 49) and demonstrates indistinct posterior extension of soft tissue into the epidural space at that level. Questionable abnormal bone mineralization also at T10. And similar indistinct osteolysis of T12 with mild superior endplate deformity. L1 through L3 appear intact with no definite bone lesion. But there is a round osteolytic lesion at the anterior L4 body. No pathologic fracture. And similar heterogeneous lucency in the L5 body. No pathologic fracture. Visible sacrum and SI joints appear intact. No sclerotic bone lesion. Paraspinal and other soft tissues: Abnormal lower thoracic paraspinal soft tissues most suspicious for extraosseous extension of tumor. Lumbar paraspinal soft tissues remain within normal limits. Abdominal and pelvic viscera are reported separately. Disc levels: Superimposed lumbar spine degeneration primarily at L4-L5 and L5-S1. But only mild associated degenerative spinal stenosis.  IMPRESSION: 1. 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 Moderate pathologic fracture of T11, with suspected epidural and extraosseous extension of tumor there. Consider encroachment on the lower thoracic spinal cord, MRI would be confirmatory. Mild pathologic fracture of T12. L4 and L5 vertebral metastases also suspected. 2. See also CTA abdomen and Pelvis today reported separately. 3. Lumbar spine degeneration with only mild degenerative spinal stenosis. Electronically Signed   By: Genevie Ann M.D.   On: 10/31/2021 10:53   DG Chest Port 1 View  Result Date: 11/09/2021 CLINICAL DATA:  Status post bronchoscopy  and biopsy EXAM: PORTABLE CHEST 1 VIEW COMPARISON:  10/31/2021 FINDINGS: Cardiomegaly status post median sternotomy. Cavitary lesion of the posterior right lower lobe is poorly appreciated on frontal chest radiograph. No pneumothorax. The visualized skeletal structures are unremarkable. IMPRESSION: 1. Cardiomegaly status post median sternotomy. 2. Cavitary lesion of the posterior right lower lobe is poorly appreciated on frontal chest radiograph. No pneumothorax following bronchoscopic biopsy. Electronically Signed   By: Delanna Ahmadi M.D.   On: 11/09/2021 15:30   CT Angio Abd/Pel w/ and/or w/o  Result Date: 10/31/2021 CLINICAL DATA:  Aortic aneurysm with pain radiating into the back EXAM: CTA ABDOMEN AND PELVIS WITHOUT AND WITH CONTRAST TECHNIQUE: Multidetector CT imaging of the abdomen and pelvis was performed using the standard protocol during bolus administration of intravenous contrast. Multiplanar reconstructed images and MIPs were obtained and reviewed to evaluate the vascular anatomy. CONTRAST:  155mL OMNIPAQUE IOHEXOL 350 MG/ML SOLN COMPARISON:  None. FINDINGS: VASCULAR Aorta: The aorta is normal in caliber. Mild atherosclerotic calcifications. No aneurysm or dissection. Celiac: Patent without evidence of aneurysm, dissection, vasculitis or significant stenosis. SMA: Patent  without evidence of aneurysm, dissection, vasculitis or significant stenosis. Renals: Both renal arteries are patent without evidence of aneurysm, dissection, vasculitis, fibromuscular dysplasia or significant stenosis. IMA: Patent without evidence of aneurysm, dissection, vasculitis or significant stenosis. Inflow: Patent without evidence of aneurysm, dissection, vasculitis or significant stenosis. Proximal Outflow: Bilateral common femoral and visualized portions of the superficial and profunda femoral arteries are patent without evidence of aneurysm, dissection, vasculitis or significant stenosis. Veins: No focal venous abnormality. Review of the MIP images confirms the above findings. NON-VASCULAR Lower chest: Cavitary subpleural posterior right lower lobe mass measuring approximately 7.4 x 4.2 by 2.9 cm. There appears to be slight extension through the pleura and into the subpleural fat (image 24 series 5) but no involvement of the overlying ninth rib. Atelectasis versus scarring present in the lingula. Mild bronchial wall thickening. Patient is status post median sternotomy. The heart is normal in size. No pericardial effusion. Moderately large sliding hiatal hernia. Hepatobiliary: No focal liver abnormality is seen. No gallstones, gallbladder wall thickening, or biliary dilatation. Pancreas: Unremarkable. No pancreatic ductal dilatation or surrounding inflammatory changes. Spleen: Normal in size without focal abnormality. Adrenals/Urinary Tract: Adrenal glands are unremarkable. Kidneys are normal, without renal calculi, focal lesion, or hydronephrosis. Bladder is unremarkable. 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 No evidence of obstruction or focal bowel wall thickening. The appendix is not visualized and may be surgically absent. The terminal ileum is unremarkable. Lymphatic: No suspicious lymphadenopathy. Reproductive: Prostate is unremarkable. Other: No  abdominal wall hernia or abnormality. No abdominopelvic ascites. Musculoskeletal: Acute compression fracture at T11 with approximately 40% height loss. There is mottled decreased attenuation throughout the vertebral body concerning for metastatic disease. Additionally, mottled decreased echogenicity with a mildly in appearance present throughout the T12 vertebral body. Slight defect in the superior endplate also concerning for an acute pathologic fracture. No significant height loss. Approximately 1.5 cm lucency in the anterior superior aspect of L4 concerning for an additional lytic lesion. Questionable lucency as well within the vertebral body at T9, possible additional focus of metastatic disease. Lucency underlying the superior endplate at L5 consistent with additional metastatic disease. Incidentally, there is also degenerative disc disease at L4-L5, L5-S1 and facet arthropathy bilaterally at 4 5 and 5 1. IMPRESSION: VASCULAR 1. Negative for abdominal aortic aneurysm, dissection or other acute vascular abnormality. 2. Aortic Atherosclerosis (ICD10-I70.0). NON-VASCULAR 1. Unfortunately, CT findings  are highly concerning for a centrally necrotic 7.4 cm primary bronchogenic carcinoma in the posterior right lower lobe with multifocal osseous metastatic disease including likely pathologic fractures at T11 and T12. Given central necrosis, the primary differential consideration is squamous cell carcinoma. Recommend referral to multi disciplinary thoracic tumor board for further evaluation and tissue diagnosis. 2. Pathologic T11 compression fracture with approximately 40% height loss. 3. Likely subtle pathologic fracture involving the superior endplate of Z61 without height loss. 4. Multifocal osseous metastatic disease including at least T9, T11, T12, L4 and L5. 5. No solid organ metastasis identified within the abdomen or pelvis. 6. 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  Electronically Signed   By: Jacqulynn Cadet M.D.   On: 10/31/2021 10:59   DG C-ARM BRONCHOSCOPY  Result Date: 11/09/2021 C-ARM BRONCHOSCOPY: Fluoroscopy was utilized by the requesting physician.  No radiographic interpretation.    PERFORMANCE STATUS (ECOG) : 2 - Symptomatic, <50% confined to bed  Review of Systems  Constitutional:  Positive for appetite change.  Musculoskeletal:  Positive for arthralgias and back pain.  Neurological:  Positive for weakness.  Unless otherwise noted, a complete review of systems is negative.  Physical Exam General: NAD, in a wheelchair  Cardiovascular: regular rate and rhythm Pulmonary: clear ant fields Abdomen: soft, nontender, + bowel sounds Extremities: no edema, no joint deformities Neurological: Weakness but otherwise nonfocal  IMPRESSION: This is my initial visit with Carlos Erickson and his wife.  He is sitting in a wheelchair.  Had a radiation treatment prior to our appointment.  Reports he is tolerating without difficulty.  No acute distress noted.  I introduced myself, Advertising copywriter, and Palliative's role in collaboration with the oncology team. Concept of Palliative Care was introduced as specialized medical care for people and their families living with serious illness.  It focuses on providing relief from the symptoms and stress of a serious illness.  The goal is to improve quality of life for both the patient and the family. Values and goals of care important to patient and family were attempted to be elicited.   Mr. Maye lives in the home with his wife of more than 82 years.  They have 2 children and 2 grandchildren.  He is a retired Chart[employer] loss adjuster.  He is ambulatory in the home with some assistance.  Reports due to his significant back pain he has been unable to sleep in his bed and now spends most of his time in one of his recliner's.  States he has significant pain in his back and some generalized.  Has not been able to rest well  due to pain and discomfort.  Denies nausea or vomiting.  Endorses constipation and decreased appetite.  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  He is able to tolerate radiation but pain is escalated after his treatment.  Due to his pain he is unable to lay down for long periods of time and often has to change positions.  During his recent visit with Dr. Burr Medico he was started on Dilaudid 2 mg in addition to MS Contin 15 mg twice daily.  Unfortunately patient has been unable to get MS Contin and has only been taking his Dilaudid.  Reports his pain decreases when taking however returns in about 4-5 hours.  We discussed taking Dilaudid every 4 than 6 hours as needed for breakthrough pain.  We have reached out to his pharmacy and MS Contin will  be written to be filled today.  Pharmacy did not feel questioning patient having to opioids prescribed.  Patient is aware and will plan to begin with his first dose this afternoon.  We will continue to closely evaluate his pain and make adjustments as needed.  Given he has not started MS Contin will not make any injuries to dosage of Dilaudid other day time interval.  Constipation He has ongoing constipation.  Does take milk of magnesia to help facilitate a bowel movement.  States he generally has 1-2 bowel movements per week.  Education provided on adhering to a strict bowel regimen to prevent ongoing constipation in the setting of opioid use.  He will begin taking MiraLAX daily in addition to senna.  If he does not have a bowel movement within 48 hours requested to increase MiraLAX to twice daily.  Also encouraged increased water intake.  Insomnia Dorothy shares he does not sleep well at night and spends most of his time awake.  He does contribute some of his insomnia to his frequent pain and discomfort.  He does sleep in a recliner and has not slept in his bed in several months.  We discussed ways to facilitate rest.  I  am hopeful once his pain is better controlled and he starts MS Contin this will provide him some relief and allow him mobility to get increased rest.  We discussed if this remains a concern consideration of pharmacological interventions to assist with insomnia.  Decreased appetite/weight loss Wife reports his appetite has decreased significantly over the past 1-2 months.  He reports approximately a 15 pound weight loss since December with no efforts.  He previously had a robust appetite however now he feels full immediately.  He shares a kids happy meal from [employer] makes him full. Education provided on increasing protein intake and focusing on 6 small meals throughout the day versus 3 large meals.  Wife shares he does drink at least 1-2 boost per day.    I discussed the importance of continued conversation with family and their medical providers regarding overall plan of care and treatment options, ensuring decisions are within the context of the patients values and GOCs.  PLAN: MS Contin 15 mg twice daily.  Patient had not previously been taking.  We have spoken with pharmacy and medication will be available to take up today.  He will plan to take his first dose tonight. Dilaudid 2 mg every 4-6 hours as needed for pain MiraLAX/senna daily for bowel regimen.  Patient knows if no bowel movement within 24 hours to increase MiraLAX to twice daily. Boost protein drinks 1-2/day.  Education provided on focusing on small frequent meals throughout the day versus 3 large meals, eating when he has a desire and snacking.  We will continue to closely monitor and initiate appetite stimulant if needed in the future. I will plan to see patient back in a week in collaboration to other oncology appointments.    Patient expressed understanding and was in agreement with this plan. He also understands that He can call the clinic at any time with any questions, concerns, or complaints.   Time Total: 55 min  Visit  consisted of counseling and education dealing with the complex and emotionally intense issues of symptom management and palliative care in the setting of serious and potentially life-threatening illness.Greater than 50%  of this time was spent counseling and coordinating care related to the above assessment and plan.  Signed by: Alda Lea, AGPCNP-BC Palliative  Medicine Team

## 2021-11-18 ENCOUNTER — Ambulatory Visit
Admission: RE | Admit: 2021-11-18 | Discharge: 2021-11-18 | Disposition: A | Discharge status: Home / Self Care | Payer: Medicare Other | Source: Ambulatory Visit | Attending: Radiation Oncology | Admitting: Radiation Oncology

## 2021-11-18 ENCOUNTER — Ambulatory Visit: Payer: Medicare Other

## 2021-11-18 DIAGNOSIS — K5900 Constipation, unspecified: Secondary | ICD-10-CM | POA: Diagnosis not present

## 2021-11-18 DIAGNOSIS — Z510 Encounter for antineoplastic radiation therapy: Secondary | ICD-10-CM | POA: Diagnosis not present

## 2021-11-18 DIAGNOSIS — C3431 Malignant neoplasm of lower lobe, right bronchus or lung: Secondary | ICD-10-CM | POA: Diagnosis not present

## 2021-11-18 DIAGNOSIS — C7951 Secondary malignant neoplasm of bone: Secondary | ICD-10-CM | POA: Diagnosis not present

## 2021-11-18 DIAGNOSIS — G893 Neoplasm related pain (acute) (chronic): Secondary | ICD-10-CM | POA: Diagnosis not present

## 2021-11-19 ENCOUNTER — Inpatient Hospital Stay: Payer: Medicare Other | Admitting: Hematology

## 2021-11-19 ENCOUNTER — Ambulatory Visit
Admission: RE | Admit: 2021-11-19 | Discharge: 2021-11-19 | Disposition: A | Discharge status: Home / Self Care | Payer: Medicare Other | Source: Ambulatory Visit | Attending: Radiation Oncology | Admitting: Radiation Oncology

## 2021-11-19 ENCOUNTER — Ambulatory Visit: Payer: Medicare Other

## 2021-11-19 ENCOUNTER — Other Ambulatory Visit: Payer: Self-pay

## 2021-11-19 ENCOUNTER — Inpatient Hospital Stay: Payer: Medicare Other

## 2021-11-19 ENCOUNTER — Telehealth: Payer: Self-pay

## 2021-11-19 ENCOUNTER — Encounter: Payer: Self-pay | Admitting: Hematology

## 2021-11-19 VITALS — BP 95/71 | HR 84 | Temp 98.3°F | Resp 18

## 2021-11-19 DIAGNOSIS — C7951 Secondary malignant neoplasm of bone: Secondary | ICD-10-CM

## 2021-11-19 DIAGNOSIS — Z510 Encounter for antineoplastic radiation therapy: Secondary | ICD-10-CM | POA: Diagnosis not present

## 2021-11-19 DIAGNOSIS — C3431 Malignant neoplasm of lower lobe, right bronchus or lung: Secondary | ICD-10-CM | POA: Diagnosis not present

## 2021-11-19 DIAGNOSIS — G893 Neoplasm related pain (acute) (chronic): Secondary | ICD-10-CM | POA: Diagnosis not present

## 2021-11-19 DIAGNOSIS — C3490 Malignant neoplasm of unspecified part of unspecified bronchus or lung: Secondary | ICD-10-CM

## 2021-11-19 DIAGNOSIS — K5900 Constipation, unspecified: Secondary | ICD-10-CM | POA: Diagnosis not present

## 2021-11-19 MED ORDER — MORPHINE SULFATE (PF) 2 MG/ML IV SOLN
2.0000 mg | Freq: Once | INTRAVENOUS | Status: AC
  Administered 2021-11-19: 2 mg via INTRAVENOUS
  Filled 2021-11-19: qty 1

## 2021-11-19 MED ORDER — SODIUM CHLORIDE 0.9 % IV SOLN: INTRAVENOUS | Status: DC

## 2021-11-19 MED ORDER — MORPHINE SULFATE 4 MG/ML IJ SOLN
4.0000 mg | Freq: Once | INTRAMUSCULAR | Status: AC
  Administered 2021-11-19: 4 mg via INTRAVENOUS
  Filled 2021-11-19: qty 1

## 2021-11-19 MED ORDER — RIVAROXABAN 10 MG PO TABS: 10.0000 mg | ORAL_TABLET | Freq: Every day | ORAL | 1 refills | Status: DC

## 2021-11-19 MED ORDER — GABAPENTIN 300 MG PO CAPS: 300.0000 mg | ORAL_CAPSULE | Freq: Two times a day (BID) | ORAL | 1 refills | Status: AC

## 2021-11-19 NOTE — Telephone Encounter (Signed)
I spoke with Carlos Erickson wife this morning who stated that Carlos Erickson was in extreme pain and was worried about making it to his radiation appt. I asked her how he was taking his pain medication at home. She stated that he was taking his MS Contin every 12 hours but, during the night, was taking Tylenol rather than his Dilaudid and only took it this morning once his pain was out of control.  I educated his wife on the importance of taking the Dilaudid every 4 hours if needed to control the pain and not wait until it is out of control, as this will be really difficult to get back under control. Understanding verbalized. All questions answered. Advised to call office with any questions/concerns.

## 2021-11-19 NOTE — Patient Instructions (Signed)
Dehydration, Adult Dehydration is a condition in which there is not enough water or other fluids in the body. This happens when a person loses more fluids than he or she takes in. Important organs, such as the kidneys, brain, and heart, cannot function without a proper amount of fluids. Any loss of fluids from the body can lead to dehydration. Dehydration can be mild, moderate, or severe. It should be treated right away to prevent it from becoming severe. What are the causes? Dehydration may be caused by: Conditions that cause loss of water or other fluids, such as diarrhea, vomiting, or sweating or urinating a lot. Not drinking enough fluids, especially when you are ill or doing activities that require a lot of energy. Other illnesses and conditions, such as fever or infection. Certain medicines, such as medicines that remove excess fluid from the body (diuretics). Lack of safe drinking water. Not being able to get enough water and food. What increases the risk? The following factors may make you more likely to develop this condition: Having a long-term (chronic) illness that has not been treated properly, such as diabetes, heart disease, or kidney disease. Being 72 years of age or older. Having a disability. Living in a place that is high in altitude, where thinner, drier air causes more fluid loss. Doing exercises that put stress on your body for a long time (endurance sports). What are the signs or symptoms? Symptoms of dehydration depend on how severe it is. Mild or moderate dehydration Thirst. Dry lips or dry mouth. Dizziness or light-headedness, especially when standing up from a seated position. Muscle cramps. Dark urine. Urine may be the color of tea. Less urine or tears produced than usual. Headache. Severe dehydration Changes in skin. Your skin may be cold and clammy, blotchy, or pale. Your skin also may not return to normal after being lightly pinched and released. Little or  no tears, urine, or sweat. Changes in vital signs, such as rapid breathing and low blood pressure. Your pulse may be weak or may be faster than 100 beats a minute when you are sitting still. Other changes, such as: Feeling very thirsty. Sunken eyes. Cold hands and feet. Confusion. Being very tired (lethargic) or having trouble waking from sleep. Short-term weight loss. Loss of consciousness. How is this diagnosed? This condition is diagnosed based on your symptoms and a physical exam. You may have blood and urine tests to help confirm the diagnosis. How is this treated? Treatment for this condition depends on how severe it is. Treatment should be started right away. Do not wait until dehydration becomes severe. Severe dehydration is an emergency and needs to be treated in a hospital. Mild or moderate dehydration can be treated at home. You may be asked to: Drink more fluids. Drink an oral rehydration solution (ORS). This drink helps restore proper amounts of fluids and salts and minerals in the blood (electrolytes). Severe dehydration can be treated: With IV fluids. By correcting abnormal levels of electrolytes. This is often done by giving electrolytes through a tube that is passed through your nose and into your stomach (nasogastric tube, or NG tube). By treating the underlying cause of dehydration. Follow these instructions at home: Oral rehydration solution If told by your health care provider, drink an ORS: Make an ORS by following instructions on the package. Start by drinking small amounts, about  cup (120 mL) every 5-10 minutes. Slowly increase how much you drink until you have taken the amount recommended by your health  care provider. Eating and drinking     Drink enough clear fluid to keep your urine pale yellow. If you were told to drink an ORS, finish the ORS first and then start slowly drinking other clear fluids. Drink fluids such as: Water. Do not drink only water.  Doing that can lead to hyponatremia, which is having too little salt (sodium) in the body. Water from ice chips you suck on. Fruit juice that you have added water to (diluted fruit juice). Low-calorie sports drinks. Eat foods that contain a healthy balance of electrolytes, such as bananas, oranges, potatoes, tomatoes, and spinach. Do not drink alcohol. Avoid the following: Drinks that contain a lot of sugar. These include high-calorie sports drinks, fruit juice that is not diluted, and soda. Caffeine. Foods that are greasy or contain a lot of fat or sugar. General instructions Take over-the-counter and prescription medicines only as told by your health care provider. Do not take sodium tablets. Doing that can lead to having too much sodium in the body (hypernatremia). Return to your normal activities as told by your health care provider. Ask your health care provider what activities are safe for you. Keep all follow-up visits as told by your health care provider. This is important. Contact a health care provider if: You have muscle cramps, pain, or discomfort, such as: Pain in your abdomen and the pain gets worse or stays in one area (localizes). Stiff neck. You have a rash. You are more irritable than usual. You are sleepier or have a harder time waking than usual. You feel weak or dizzy. You feel very thirsty. Get help right away if you have: Any symptoms of severe dehydration. Symptoms of vomiting, such as: You cannot eat or drink without vomiting. Vomiting gets worse or does not go away. Vomit includes blood or green matter (bile). Symptoms that get worse with treatment. A fever. A severe headache. Problems with urination or bowel movements, such as: Diarrhea that gets worse or does not go away. Blood in your stool (feces). This may cause stool to look black and tarry. Not urinating, or urinating only a small amount of very dark urine, within 6-8 hours. Trouble  breathing. These symptoms may represent a serious problem that is an emergency. Do not wait to see if the symptoms will go away. Get medical help right away. Call your local emergency services (911 in the [place] Do not drive yourself to the hospital. Summary Dehydration is a condition in which there is not enough water or other fluids in the body. This happens when a person loses more fluids than he or she takes in. Treatment for this condition depends on how severe it is. Treatment should be started right away. Do not wait until dehydration becomes severe. Drink enough clear fluid to keep your urine pale yellow. If you were told to drink an oral rehydration solution (ORS), finish the ORS first and then start slowly drinking other clear fluids. Take over-the-counter and prescription medicines only as told by your health care provider. Get help right away if you have any symptoms of severe dehydration. This information is not intended to replace advice given to you by your health care provider. Make sure you discuss any questions you have with your health care provider. Document Revised: 05/23/2019 Document Reviewed: 05/23/2019 Elsevier Patient Education  [place] [facility] Cancer pain is different for everyone. It is important to work with your cancer care team to develop a pain management plan that  is best for you. There are many options for pain control, and cancer pain can usually be managed with the right plan. Making lifestyle changes and following home care instructions from your cancer care team are also important parts of your plan. This can improve your quality of life while managing cancer pain. How to manage lifestyle changes Managing cancer pain can be stressful and exhausting. You may find that it is harder to control your pain when your levels of stress and exhaustion increase. Making lifestyle changes may help you lower stress and manage cancer pain. Managing  stress  Try stress reduction techniques. These may lower stress and anxiety and take your mind off your pain. Ask your cancer care team for advice on good options. You may want to try: Guided relaxation. Meditation. Self-hypnosis. Deep breathing. Gentle yoga. Tai chi. Massage. Get regular exercise. Exercise lowers stress and helps you sleep better at night. Ask your cancer care team what type of exercise is best for you. Make sure you have a good support system of friends, family, and other care providers. Use your support system for emotional support and for help with daily chores and activities. Consider joining a cancer support group. Ask your cancer care team to recommend one in your area. Do not use drugs or alcohol to manage stress. This can make both stress and pain worse. Avoiding exhaustion Eat a healthy diet. Work with your cancer care team to come up with a nutritious diet that is best for you. Plan activities for times when you are most rested and your pain is best controlled. This will help you avoid becoming physically exhausted. Get enough sleep. Being overtired can increase pain. Tell your cancer care team if you are having trouble sleeping. Follow these instructions at home: Medicines Take over-the-counter and prescription medicines only as told by your health care provider. Take your pain medicine on a regular schedule before pain becomes too severe. You may have medicine for pain that happens in between doses of your usual pain control medicine (breakthrough pain). Do not wait until breakthrough pain is severe before taking the designated medicine. Do not stop or reduce your pain medicine before talking to your cancer care team. Do not crush or break pain pills unless your health care provider says you can. Keep a 1-week supply of pain medicine on hand. Store your medicine safely away from children and pets. Keep a complete list of all your medicines. Activity Return to  your normal activities as told by your health care provider. Ask your health care provider what activities are safe for you. Do exercises as told by your health care provider. Increase your activity level as your pain is relieved. Do not drive or use heavy machinery while taking prescription pain medicine. Managing constipation You may need to take actions to prevent or treat constipation, such as: Drink enough fluid to keep your urine pale yellow. Take over-the-counter or prescription medicines. Eat foods that are high in fiber, such as beans, whole grains, and fresh fruits and vegetables. Limit foods that are high in fat and processed sugars, such as fried or sweet foods. Alcohol use Do not drink alcohol if: Your health care provider tells you not to drink. You are pregnant, may be pregnant, or are planning to become pregnant. If you drink alcohol: Limit how much you use to: 0-1 drink a day for women. 0-2 drinks a day for men. Be aware of how much alcohol is in your drink. In the [place]  one drink equals one 12 oz bottle of beer (355 mL), one 5 oz glass of wine (148 mL), or one 1 oz glass of hard liquor (44 mL). General instructions  Ask your cancer care team about keeping a pain diary. This may help your care team adjust your pain control plan as needed. Ask to meet with a pain specialist who can help create a plan of care that works well for you. Use gentle creams and lotions to keep your skin moist. Do not use any products that contain nicotine or tobacco, such as cigarettes, e-cigarettes, and chewing tobacco. If you need help quitting, ask your health care provider. Keep all follow-up visits as told by your health care provider. This is important. Where to find more information American Cancer Society: www.cancer.[place]: www.cancer.gov Contact a health care provider if: Your pain medicine is not controlling your pain. You have trouble sleeping. You feel  depressed or anxious. Your pain medicine is making you nauseous, sleepy, dizzy, or constipated. You are unable to manage your pain at home. Summary Cancer pain is different for everyone. Work with your cancer care team to develop the pain management plan that is best for you. Making lifestyle changes and following home care instructions can improve your quality of life while managing cancer pain. Lifestyle changes include eating a nutritious diet, getting regular exercise, and managing stress. Carefully follow instructions for taking your medicine as told by your cancer care team. Let your cancer care team know if your pain is not controlled at home. This information is not intended to replace advice given to you by your health care provider. Make sure you discuss any questions you have with your health care provider. Document Revised: 04/21/2021 Document Reviewed: 06/14/2018 Elsevier Patient Education  2022 [employer].

## 2021-11-19 NOTE — Progress Notes (Signed)
Carlos Erickson   [phone] Fax:([phone]   Clinic Follow up Note   Patient Care Team: Owens Loffler, MD as PCP - General (Family Medicine) Wellington Hampshire, MD as PCP - Cardiology (Cardiology) Valrie Hart, RN as Oncology Nurse Navigator (Oncology) Pickenpack-Cousar, Carlena Sax, NP as Nurse Practitioner (Nurse Practitioner)  Date of Service:  11/19/2021  CHIEF COMPLAINT: f/u of metastatic adenocarcinoma  CURRENT THERAPY:  Palliative radiation therapy, 1/24-11/29/21  ASSESSMENT & PLAN:  Carlos Erickson is a 71 y.o. male with   1. Pain from Pathologic fracture of T11 and T12 -secondary to #2 -he began palliative radiation under Dr. Tammi Klippel on 11/16/21. -he met with palliative care NP Nikki on 11/17/21, plan is for MS Contin 51m BID, Dilaudid [id number] as needed -I called in gabapentin 300 mg twice daily for him today -Previously tried prednisone which did not help -We will give him IV morphine 4 mg before his radiation treatment today -He has appointment with NiLexine Batonext week  2. 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 Lumbar spine CT showed multifocal osseous metastatic disease with moderate pathologic fracture of T11. Further work up with CT CAP showed a 6.9 cm cavitary RLL lesion, no lymphadenopathy or other mets  -he underwent bronchoscopy on 11/09/21, pathology showed adenocarcinoma in RLL and level 7 lymph node. -thoracic spine MRI on 11/11/21 showed: numerous levels of vertebral metastatic disease, most notable at T11 and T12 where there is confluent body involvement and pathologic fracture. -brain MRI done the same day was negative -FO on his biopsy is pending  -I have sent a message to Dr. DeEstanislado Pandyper pt request) for consideration of kyphoplasty. -continue RT    3.  DVT prophylaxis -Due to his immobility, and underlying malignancy, he is at very high risk  for DVT -I will call in Xarelto 1016maily for DVT prophylaxis -He is on Plavix. He knows to watch for signs of bleeding    PLAN: -We will give him normal saline 500 cc and IV morphine 4 mg before his radiation today -I told him to take dilaudid 1 tablets 30 to 60 minutes before daily radiation -I called in gabapentin 300 mg twice daily today -Follow-up with NikLexine Batonxt week -Follow-up with me in 2 weeks to review foundation 1 results, or sooner if FO returns earlier    No problem-specific Assessment & Plan notes found for this encounter.   SUMMARY OF ONCOLOGIC HISTORY: Oncology History  Bone metastasis ([facility] Initial Diagnosis   Bone metastasis ([facility] 10/31/2021 Imaging   EXAM: CT LUMBAR SPINE WITH CONTRAST  IMPRESSION: 1. 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 Moderate pathologic fracture of T11, with suspected epidural and extraosseous extension of tumor there. Consider encroachment on the lower thoracic spinal cord, MRI would be confirmatory. Mild pathologic fracture of T12. L4 and L5 vertebral metastases also suspected.   2. See also CTA abdomen and Pelvis today reported separately.   3. Lumbar spine degeneration with only mild degenerative spinal stenosis.   10/31/2021 Imaging   EXAM: CTA ABDOMEN AND PELVIS WITHOUT AND WITH CONTRAST  IMPRESSION: VASCULAR   1. Negative for abdominal aortic aneurysm, dissection or other acute vascular abnormality. 2. Aortic Atherosclerosis (ICD10-I70.0).   NON-VASCULAR   1. Unfortunately, CT findings are highly concerning for a centrally necrotic 7.4 cm primary bronchogenic carcinoma in the posterior right lower lobe with multifocal  osseous metastatic disease including likely pathologic fractures at T11 and T12. Given central necrosis, the primary differential consideration is squamous cell carcinoma. Recommend referral to multi disciplinary thoracic tumor board for further evaluation and tissue  diagnosis. 2. Pathologic T11 compression fracture with approximately 40% height loss. 3. Likely subtle pathologic fracture involving the superior endplate of Q67 without height loss. 4. Multifocal osseous metastatic disease including at least T9, T11, T12, L4 and L5. 5. No solid organ metastasis identified within the abdomen or pelvis. 6. 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   10/31/2021 Imaging   EXAM: CT CHEST WITH CONTRAST  IMPRESSION: 1. There is a thick-walled subpleural cavitary lesion of the dependent right lower lobe measuring 6.9 cm, highly concerning for primary lung malignancy. Cavitary infection is however a general differential consideration. 2. Wedge deformity of T11 and superior endplate deformity of Y19 with subtle underlying lytic character, highly concerning for osseous metastatic disease and pathologic fracture. 3. Additional small lucent focus at the left aspect of T9, suspicious for an additional metastatic lesion. 4. No evidence of lymphadenopathy in the chest. 5. Emphysema. 6. Coronary artery disease.   Aortic Atherosclerosis (ICD10-I70.0) and Emphysema (ICD10-J43.9).   11/09/2021 Initial Biopsy   FINAL MICROSCOPIC DIAGNOSIS:  A. LUNG, RLL, FINE NEEDLE ASPIRATION AND BIOPSIES:  - Adenocarcinoma.  See comment.  B. LUNG, RLL, BRUSH:  - Adenocarcinoma.   FINAL MICROSCOPIC DIAGNOSIS:  C. LYMPH NODE, 7, FINE NEEDLE ASPIRATION:  - Adenocarcinoma.  COMMENT:  The cells stain positively for cytokeratin 7 and cytokeratin 20, and  have patchy positivity for CDX2.  TTF-1 and p40 immunostains are negative.  The differential diagnosis includes primary pulmonary adenocarcinoma as well as a GI primary.   11/11/2021 Imaging   EXAM: MRI THORACIC WITHOUT AND WITH CONTRAST  IMPRESSION: Numerous levels of vertebral metastatic disease (listed above) most notable at T11 and T12 where there is confluent body involvement and pathologic  fracture. Body fracture at T11 causes 60% height loss and retropulsion with ventral epidural tumor contacting but not compressing the cord.   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[place])  11/09/2021 Cancer Staging   Staging form: Bone - Appendicular Skeleton, Trunk, Skull, and Facial Bones, AJCC 8th Edition - Clinical stage from 11/09/2021: Stage IVB (cT1, cN0, pM1b) - Signed by Truitt Merle, MD on 11/13/2021 Stage prefix: Initial diagnosis    11/13/2021 Initial Diagnosis   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 [facility])      INTERVAL HISTORY:  Carlos Erickson is here for a follow up of metastatic adenocarcinoma. He was last seen by me on 11/12/21 with palliative care visit in the interim. He presents to the clinic accompanied by his wife.  He came in a wheelchair.  He started radiation earlier this week, but has very hard time to lay on the radiation table, so we called him in today to give him IV morphine.  He just started MS Contin a few days ago, he is Dilaudid every 4 hours which does significantly improve his pain.  But overall his pain is still not controlled.  He cannot stand or walk due to the severe pain.  He has been laying in recliner lately.    All other systems were reviewed with the patient and are negative.  MEDICAL HISTORY:  Past Medical History:  Diagnosis Date   Arthritis    Barrett's esophagus 07/04/2016   pt states he was told he does not have this.   CAD (coronary artery  disease), native coronary artery 05/2014   Non-ST elevation myocardial infarction. Echocardiogram showed normal LV systolic function with mild to moderate mitral regurgitation. Cardiac catheterization showed significant two-vessel coronary artery disease including the RCA and left anterior descending artery. He underwent CABG at [facility].   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   Sleep apnea 03/2019   on CPAP    SURGICAL HISTORY: Past Surgical History:  Procedure Laterality Date   BRONCHIAL BIOPSY  11/09/2021   Procedure: BRONCHIAL BIOPSIES;  Surgeon: Garner Nash, DO;  Location: [place] [facility];  Service: Pulmonary;;   BRONCHIAL BRUSHINGS  11/09/2021   Procedure: BRONCHIAL BRUSHINGS;  Surgeon: Garner Nash, DO;  Location: [place] [facility];  Service: Pulmonary;;   BRONCHIAL NEEDLE ASPIRATION BIOPSY  11/09/2021   Procedure: BRONCHIAL NEEDLE ASPIRATION BIOPSIES;  Surgeon: Garner Nash, DO;  Location: [place] [facility];  Service: Pulmonary;;   CARDIAC CATHETERIZATION  03/2014   [facility]   cataract Right 1980   Dr. Katy Fitch   CORONARY ARTERY BYPASS GRAFT  05/2014   [facility], CABG x 2   EYE SURGERY     HEMORROIDECTOMY     LUNG BIOPSY  2006   VATS   VIDEO BRONCHOSCOPY WITH ENDOBRONCHIAL ULTRASOUND  11/09/2021   Procedure: VIDEO BRONCHOSCOPY WITH ENDOBRONCHIAL ULTRASOUND;  Surgeon: Garner Nash, DO;  Location: [place] [facility];  Service: Pulmonary;;    I have reviewed the social history and family history with the patient and they are unchanged from previous note.  ALLERGIES:  is allergic to plasma protein fraction, icosapent ethyl, and morphine and related.  MEDICATIONS:  Current Outpatient Medications  Medication Sig Dispense Refill   gabapentin (NEURONTIN) 300 MG capsule Take 1 capsule (300 mg total) by mouth 2 (two) times daily. 60 capsule 1   acetaminophen (TYLENOL) 325 MG tablet Take 2 tablets (650 mg total) by mouth every 6 (six) hours as needed for up to 30 doses for mild pain or moderate pain. 30 tablet 0   acetaminophen (TYLENOL) 500 MG tablet Take 500 mg by mouth every 6 (six) hours as needed for moderate pain or headache.     amLODipine (NORVASC) 10 MG tablet TAKE 1 TABLET BY MOUTH  DAILY (Patient taking differently: Take 10 mg by mouth daily.) 90 tablet 3   atorvastatin (LIPITOR) 20 MG tablet TAKE 1 TABLET BY  MOUTH ONCE DAILY (Patient taking differently: Take 20 mg by mouth daily.) 90 tablet 3   carvedilol (COREG) 6.25 MG tablet TAKE 1 TABLET BY MOUTH  TWICE DAILY (Patient taking differently: Take 6.25 mg by mouth 2 (two) times daily with a meal.) 180 tablet 3   clopidogrel (PLAVIX) 75 MG tablet TAKE 1 TABLET BY MOUTH ONCE DAILY (Patient taking differently: Take 75 mg by mouth daily.) 90 tablet 1   Evolocumab (REPATHA SURECLICK) 342 MG/ML SOAJ Inject 1 pen into the skin every 14 (fourteen) days. (Patient not taking: Reported on 10/31/2021) 6 mL 3   HYDROmorphone (DILAUDID) 2 MG tablet Take 1 tablet (2 mg total) by mouth every 4 (four) hours as needed for up to 21 days for severe pain or moderate pain. 60 tablet 0   losartan (COZAAR) 100 MG tablet TAKE 1 TABLET BY MOUTH  DAILY (Patient taking differently: Take 100 mg by mouth daily.) 90 tablet 3  morphine (MS CONTIN) 15 MG 12 hr tablet Take 1 tablet (15 mg total) by mouth every 12 (twelve) hours. 30 tablet 0   omeprazole (PRILOSEC) 20 MG capsule Take 1 capsule (20 mg total) by mouth 2 (two) times daily before a meal. (Patient taking differently: Take 20 mg by mouth daily.) 60 capsule 0   predniSONE (DELTASONE) 10 MG tablet Take 10 mg by mouth See admin instructions. Take 1 tab TID for 2 days, 1 tab BID for 5 days, 1 tab daily until finished. (Patient not taking: Reported on 11/04/2021)     senna-docusate (SENOKOT-S) 8.6-50 MG tablet Take 1 tablet by mouth daily for 30 doses. 30 tablet 0   No current facility-administered medications for this visit.    PHYSICAL EXAMINATION: ECOG PERFORMANCE STATUS: 3 - Symptomatic, >50% confined to bed  Vitals:   11/19/21 1311  BP: 95/71  Pulse: 84  Resp: 18  Temp: 98.3 F (36.8 C)  SpO2: 96%   Wt Readings from Last 3 Encounters:  11/17/21 180 lb 3.2 oz (81.7 kg)  11/15/21 174 lb (78.9 kg)  11/09/21 180 lb (81.6 kg)     GENERAL:alert, no distress and comfortable SKIN: skin color, texture, turgor are normal,  no rashes or significant lesions EYES: normal, Conjunctiva are pink and non-injected, sclera clear NECK: supple, thyroid normal size, non-tender, without nodularity LYMPH:  no palpable lymphadenopathy in the cervical, axillary  LUNGS: clear to auscultation and percussion with normal breathing effort HEART: regular rate & rhythm and no murmurs and no lower extremity edema ABDOMEN:abdomen soft, non-tender and normal bowel sounds Musculoskeletal:no cyanosis of digits and no clubbing  NEURO: alert & oriented x 3 with fluent speech, no focal motor/sensory deficits  LABORATORY DATA:  I have reviewed the data as listed CBC Latest Ref Rng & Units 11/09/2021 10/31/2021 11/28/2019  WBC 4.0 - 10.5 K/uL 9.4 13.5(H) 7.5  Hemoglobin 13.0 - 17.0 g/dL 14.7 15.6 16.1  Hematocrit 39.0 - 52.0 % 44.2 48.2 45.5  Platelets 150 - 400 K/uL 304 290 261     CMP Latest Ref Rng & Units 11/09/2021 10/31/2021 05/27/2021  Glucose 70 - 99 mg/dL 109(H) 123(H) -  BUN 8 - 23 mg/dL 14 24(H) -  Creatinine 0.61 - 1.24 mg/dL 0.90 1.06 -  Sodium 135 - 145 mmol/L 133(L) 136 -  Potassium 3.5 - 5.1 mmol/L 4.9 4.0 -  Chloride 98 - 111 mmol/L 97(L) 97(L) -  CO2 22 - 32 mmol/L 25 26 -  Calcium 8.9 - 10.3 mg/dL 9.1 9.5 -  Total Protein 6.5 - 8.1 g/dL - - 7.3  Total Bilirubin 0.3 - 1.2 mg/dL - - 1.1  Alkaline Phos 38 - 126 U/L - - 69  AST 15 - 41 U/L - - 26  ALT 0 - 44 U/L - - 28      RADIOGRAPHIC STUDIES: I have personally reviewed the radiological images as listed and agreed with the findings in the report. No results found.    No orders of the defined types were placed in this encounter.  All questions were answered. The patient knows to call the clinic with any problems, questions or concerns. No barriers to learning was detected. The total time spent in the appointment was 30 minutes.     Truitt Merle, MD 11/19/2021   I, Wilburn Mylar, am acting as scribe for Truitt Merle, MD.   I have reviewed the above documentation  for accuracy and completeness, and I agree with the above.

## 2021-11-22 ENCOUNTER — Other Ambulatory Visit: Payer: Self-pay

## 2021-11-22 ENCOUNTER — Ambulatory Visit
Admission: RE | Admit: 2021-11-22 | Discharge: 2021-11-22 | Disposition: A | Discharge status: Home / Self Care | Payer: Medicare Other | Source: Ambulatory Visit | Attending: Radiation Oncology | Admitting: Radiation Oncology

## 2021-11-22 ENCOUNTER — Ambulatory Visit: Payer: Medicare Other

## 2021-11-22 DIAGNOSIS — C3431 Malignant neoplasm of lower lobe, right bronchus or lung: Secondary | ICD-10-CM | POA: Diagnosis not present

## 2021-11-22 DIAGNOSIS — K5900 Constipation, unspecified: Secondary | ICD-10-CM | POA: Diagnosis not present

## 2021-11-22 DIAGNOSIS — Z510 Encounter for antineoplastic radiation therapy: Secondary | ICD-10-CM | POA: Diagnosis not present

## 2021-11-22 DIAGNOSIS — C7951 Secondary malignant neoplasm of bone: Secondary | ICD-10-CM | POA: Diagnosis not present

## 2021-11-22 DIAGNOSIS — G893 Neoplasm related pain (acute) (chronic): Secondary | ICD-10-CM | POA: Diagnosis not present

## 2021-11-23 ENCOUNTER — Ambulatory Visit
Admission: RE | Admit: 2021-11-23 | Discharge: 2021-11-23 | Disposition: A | Discharge status: Home / Self Care | Payer: Medicare Other | Source: Ambulatory Visit | Attending: Radiation Oncology | Admitting: Radiation Oncology

## 2021-11-23 ENCOUNTER — Ambulatory Visit: Payer: Medicare Other

## 2021-11-23 DIAGNOSIS — C7951 Secondary malignant neoplasm of bone: Secondary | ICD-10-CM | POA: Diagnosis not present

## 2021-11-23 DIAGNOSIS — K5900 Constipation, unspecified: Secondary | ICD-10-CM | POA: Diagnosis not present

## 2021-11-23 DIAGNOSIS — G893 Neoplasm related pain (acute) (chronic): Secondary | ICD-10-CM | POA: Diagnosis not present

## 2021-11-23 DIAGNOSIS — Z510 Encounter for antineoplastic radiation therapy: Secondary | ICD-10-CM | POA: Diagnosis not present

## 2021-11-23 DIAGNOSIS — C3431 Malignant neoplasm of lower lobe, right bronchus or lung: Secondary | ICD-10-CM | POA: Diagnosis not present

## 2021-11-24 ENCOUNTER — Ambulatory Visit: Payer: Medicare Other

## 2021-11-24 ENCOUNTER — Other Ambulatory Visit: Payer: Self-pay

## 2021-11-24 ENCOUNTER — Ambulatory Visit
Admission: RE | Admit: 2021-11-24 | Discharge: 2021-11-24 | Disposition: A | Discharge status: Home / Self Care | Payer: Medicare Other | Source: Ambulatory Visit | Attending: Radiation Oncology | Admitting: Radiation Oncology

## 2021-11-24 DIAGNOSIS — K5900 Constipation, unspecified: Secondary | ICD-10-CM | POA: Diagnosis not present

## 2021-11-24 DIAGNOSIS — Z510 Encounter for antineoplastic radiation therapy: Secondary | ICD-10-CM | POA: Diagnosis not present

## 2021-11-24 DIAGNOSIS — G893 Neoplasm related pain (acute) (chronic): Secondary | ICD-10-CM | POA: Insufficient documentation

## 2021-11-24 DIAGNOSIS — C7951 Secondary malignant neoplasm of bone: Secondary | ICD-10-CM | POA: Diagnosis not present

## 2021-11-24 DIAGNOSIS — C3431 Malignant neoplasm of lower lobe, right bronchus or lung: Secondary | ICD-10-CM | POA: Insufficient documentation

## 2021-11-25 ENCOUNTER — Inpatient Hospital Stay: Payer: Medicare Other | Admitting: Nurse Practitioner

## 2021-11-25 ENCOUNTER — Ambulatory Visit: Payer: Medicare Other

## 2021-11-25 ENCOUNTER — Ambulatory Visit
Admission: RE | Admit: 2021-11-25 | Discharge: 2021-11-25 | Disposition: A | Discharge status: Home / Self Care | Payer: Medicare Other | Source: Ambulatory Visit | Attending: Radiation Oncology | Admitting: Radiation Oncology

## 2021-11-25 DIAGNOSIS — G893 Neoplasm related pain (acute) (chronic): Secondary | ICD-10-CM | POA: Diagnosis not present

## 2021-11-25 DIAGNOSIS — K5900 Constipation, unspecified: Secondary | ICD-10-CM | POA: Diagnosis not present

## 2021-11-25 DIAGNOSIS — C3431 Malignant neoplasm of lower lobe, right bronchus or lung: Secondary | ICD-10-CM | POA: Diagnosis not present

## 2021-11-25 DIAGNOSIS — C7951 Secondary malignant neoplasm of bone: Secondary | ICD-10-CM | POA: Diagnosis not present

## 2021-11-25 DIAGNOSIS — Z510 Encounter for antineoplastic radiation therapy: Secondary | ICD-10-CM | POA: Diagnosis not present

## 2021-11-26 ENCOUNTER — Ambulatory Visit: Payer: Medicare Other

## 2021-11-26 ENCOUNTER — Inpatient Hospital Stay: Payer: Medicare Other | Attending: Nurse Practitioner | Admitting: Nurse Practitioner

## 2021-11-26 ENCOUNTER — Encounter: Payer: Self-pay | Admitting: Nurse Practitioner

## 2021-11-26 ENCOUNTER — Ambulatory Visit
Admission: RE | Admit: 2021-11-26 | Discharge: 2021-11-26 | Disposition: A | Discharge status: Home / Self Care | Payer: Medicare Other | Source: Ambulatory Visit | Attending: Radiation Oncology | Admitting: Radiation Oncology

## 2021-11-26 ENCOUNTER — Other Ambulatory Visit: Payer: Self-pay

## 2021-11-26 VITALS — BP 104/71 | HR 86 | Temp 98.5°F | Resp 17 | Ht 68.0 in | Wt 172.0 lb

## 2021-11-26 DIAGNOSIS — C7951 Secondary malignant neoplasm of bone: Secondary | ICD-10-CM | POA: Diagnosis not present

## 2021-11-26 DIAGNOSIS — C3431 Malignant neoplasm of lower lobe, right bronchus or lung: Secondary | ICD-10-CM | POA: Diagnosis not present

## 2021-11-26 DIAGNOSIS — R634 Abnormal weight loss: Secondary | ICD-10-CM

## 2021-11-26 DIAGNOSIS — K5900 Constipation, unspecified: Secondary | ICD-10-CM

## 2021-11-26 DIAGNOSIS — G893 Neoplasm related pain (acute) (chronic): Secondary | ICD-10-CM | POA: Insufficient documentation

## 2021-11-26 DIAGNOSIS — Z515 Encounter for palliative care: Secondary | ICD-10-CM

## 2021-11-26 DIAGNOSIS — R630 Anorexia: Secondary | ICD-10-CM

## 2021-11-26 DIAGNOSIS — C3490 Malignant neoplasm of unspecified part of unspecified bronchus or lung: Secondary | ICD-10-CM

## 2021-11-26 DIAGNOSIS — R350 Frequency of micturition: Secondary | ICD-10-CM | POA: Insufficient documentation

## 2021-11-26 DIAGNOSIS — Z510 Encounter for antineoplastic radiation therapy: Secondary | ICD-10-CM | POA: Diagnosis not present

## 2021-11-26 MED ORDER — MORPHINE SULFATE ER 15 MG PO TBCR: 15.0000 mg | EXTENDED_RELEASE_TABLET | Freq: Two times a day (BID) | ORAL | 0 refills | Status: DC

## 2021-11-26 MED ORDER — RIVAROXABAN 10 MG PO TABS: 10.0000 mg | ORAL_TABLET | Freq: Every day | ORAL | 0 refills | Status: DC

## 2021-11-26 MED ORDER — OMEPRAZOLE 20 MG PO CPDR: 20.0000 mg | DELAYED_RELEASE_CAPSULE | Freq: Every day | ORAL | 2 refills | Status: AC

## 2021-11-26 MED ORDER — DEXAMETHASONE 4 MG PO TABS: 4.0000 mg | ORAL_TABLET | Freq: Every day | ORAL | 0 refills | Status: DC

## 2021-11-26 NOTE — Progress Notes (Signed)
[place]  Telephone:(336) [phone] Fax:(336) [phone]   Name: Carlos Erickson Date: 11/26/2021 MRN: [id number]  DOB: 1951/07/18  Patient Care Team: Carlos Loffler, MD as PCP - General (Family Medicine) Carlos Hampshire, MD as PCP - Cardiology (Cardiology) Carlos Hart, RN as Oncology Nurse Navigator (Oncology) Erickson, Carlos Sax, NP as Nurse Practitioner (Nurse Practitioner)    REASON FOR CONSULTATION: Carlos Erickson is a 71 y.o. 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  Palliative ask to see for symptom management.    SOCIAL HISTORY:     reports that he quit smoking about 8 years ago. His smoking use included cigars. He has never used smokeless tobacco. He reports current alcohol use of about 7.0 - 21.0 standard drinks per week. He reports that he does not use drugs.  ADVANCE DIRECTIVES:  None on file  CODE STATUS:   PAST MEDICAL HISTORY: Past Medical History:  Diagnosis Date   Arthritis    Barrett's esophagus 07/04/2016   pt states he was told he does not have this.   CAD (coronary artery disease), native coronary artery 05/2014   Non-ST elevation myocardial infarction. Echocardiogram showed normal LV systolic function with mild to moderate mitral regurgitation. Cardiac catheterization showed significant two-vessel coronary artery disease including the RCA and left anterior descending artery. He underwent CABG at [facility].   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   Sleep apnea 03/2019   on CPAP    PAST SURGICAL HISTORY:  Past Surgical History:  Procedure Laterality Date   BRONCHIAL BIOPSY  11/09/2021    Procedure: BRONCHIAL BIOPSIES;  Surgeon: Carlos Nash, DO;  Location: [place] [facility];  Service: Pulmonary;;   BRONCHIAL BRUSHINGS  11/09/2021   Procedure: BRONCHIAL BRUSHINGS;  Surgeon: Carlos Nash, DO;  Location: [place] [facility];  Service: Pulmonary;;   BRONCHIAL NEEDLE ASPIRATION BIOPSY  11/09/2021   Procedure: BRONCHIAL NEEDLE ASPIRATION BIOPSIES;  Surgeon: Carlos Nash, DO;  Location: [place] [facility];  Service: Pulmonary;;   CARDIAC CATHETERIZATION  03/2014   [facility]   cataract Right 1980   Dr. Katy Erickson   CORONARY ARTERY BYPASS GRAFT  05/2014   [facility], CABG x 2   EYE SURGERY     HEMORROIDECTOMY     LUNG BIOPSY  2006   VATS   VIDEO BRONCHOSCOPY WITH ENDOBRONCHIAL ULTRASOUND  11/09/2021   Procedure: VIDEO BRONCHOSCOPY WITH ENDOBRONCHIAL ULTRASOUND;  Surgeon: Carlos Nash, DO;  Location: [place] [facility];  Service: Pulmonary;;   ALLERGIES:  is allergic to plasma protein fraction, icosapent ethyl, and morphine and related.  MEDICATIONS:  Current Outpatient Medications  Medication Sig Dispense Refill   dexamethasone (DECADRON) 4 MG tablet Take 1 tablet (4 mg total) by mouth daily. 30 tablet 0   acetaminophen (TYLENOL) 325 MG tablet Take 2 tablets (650 mg total) by mouth every 6 (six) hours as needed for up to 30 doses for mild pain or moderate pain. 30 tablet 0   acetaminophen (TYLENOL) 500 MG tablet Take 500 mg by mouth every 6 (six) hours as needed for moderate pain or headache.     amLODipine (NORVASC) 10 MG tablet TAKE 1 TABLET BY  MOUTH  DAILY (Patient taking differently: Take 10 mg by mouth daily.) 90 tablet 3   atorvastatin (LIPITOR) 20 MG tablet TAKE 1 TABLET BY MOUTH ONCE DAILY (Patient taking differently: Take 20 mg by mouth daily.) 90 tablet 3   carvedilol (COREG) 6.25 MG tablet TAKE 1 TABLET BY MOUTH  TWICE DAILY (Patient taking differently: Take 6.25 mg by mouth 2 (two) times daily with a meal.) 180 tablet 3   clopidogrel (PLAVIX) 75 MG tablet TAKE 1 TABLET BY MOUTH ONCE DAILY  (Patient taking differently: Take 75 mg by mouth daily.) 90 tablet 1   Evolocumab (REPATHA SURECLICK) 147 MG/ML SOAJ Inject 1 pen into the skin every 14 (fourteen) days. (Patient not taking: Reported on 10/31/2021) 6 mL 3   gabapentin (NEURONTIN) 300 MG capsule Take 1 capsule (300 mg total) by mouth 2 (two) times daily. 60 capsule 1   HYDROmorphone (DILAUDID) 2 MG tablet Take 1 tablet (2 mg total) by mouth every 4 (four) hours as needed for up to 21 days for severe pain or moderate pain. 60 tablet 0   losartan (COZAAR) 100 MG tablet TAKE 1 TABLET BY MOUTH  DAILY (Patient taking differently: Take 100 mg by mouth daily.) 90 tablet 3   morphine (MS CONTIN) 15 MG 12 hr tablet Take 1 tablet (15 mg total) by mouth every 12 (twelve) hours. 60 tablet 0   omeprazole (PRILOSEC) 20 MG capsule Take 1 capsule (20 mg total) by mouth daily. 30 capsule 2   rivaroxaban (XARELTO) 10 MG TABS tablet Take 1 tablet (10 mg total) by mouth daily. 90 tablet 0   senna-docusate (SENOKOT-S) 8.6-50 MG tablet Take 1 tablet by mouth daily for 30 doses. 30 tablet 0   No current facility-administered medications for this visit.    VITAL SIGNS: BP 104/71 (BP Location: Left Arm, Patient Position: Sitting)    Pulse 86    Temp 98.5 F (36.9 C) (Axillary)    Resp 17    Ht 5\' 8"  (1.727 m)    Wt 172 lb (78 kg)    SpO2 93%    BMI 26.15 kg/m  Filed Weights   11/26/21 1453  Weight: 172 lb (78 kg)    Estimated body mass index is 26.15 kg/m as calculated from the following:   Height as of this encounter: 5\' 8"  (1.727 m).   Weight as of this encounter: 172 lb (78 kg).  PERFORMANCE STATUS (ECOG) : 2 - Symptomatic, <50% confined to bed   Physical Exam General: NAD, in a wheelchair  Cardiovascular: regular rate and rhythm Pulmonary: normal breathing pattern  Abdomen: soft, nontender, + bowel sounds Extremities: no edema, no joint deformities Neurological: Weakness but otherwise nonfocal  IMPRESSION:  Carlos Erickson is back for  follow-up with his wife also present. No acute distress noted. He is in a wheelchair. States he is doing better than previous visit.   Neoplasm related pain His pain is much improved. He was able to get his MS Contin 15mg  after our last visit and is tolerating well. He does endorse breakthrough pain which his dilaudid is effective. Is tolerating gabapentin 300 mg. His wife shares he must take medication to stay ahead of the pain and this allows him increased comfort. We will continue to closely evaluate his pain and make adjustments as needed.    Constipation He has ongoing constipation.  Does take milk of magnesia to help facilitate a bowel movement.  He is taking Miralax and senna for his bowel regimen. Having  bowel movements more frequently although not daily. Ongoing education provided on adhering to a strict bowel regimen to prevent ongoing constipation in the setting of opioid use.  If he does not have a bowel movement within 48 hours requested to increase MiraLAX to twice daily.  Also encouraged increased water intake.  Insomnia Insomnia is improved with pain management.  He does contribute some of his insomnia to his frequent pain and discomfort.  He does sleep in a recliner and has not slept in his bed in several months.  We discussed ways to facilitate rest. We discussed if this remains a concern consideration of pharmacological interventions to assist with insomnia.  Decreased appetite/weight loss Continues to be concerned with his decreased appetite.   He reports approximately a 15 pound weight loss since December with no efforts.  He previously had a robust appetite however now he feels full immediately.  He shares a kids happy meal from [employer] makes him full. Education provided on increasing protein intake and focusing on 6 small meals throughout the day versus 3 large meals.  Wife shares he does drink at least 1-2 boost per day. His current weight today is 172lbs compared to 180lbs on  1/17.   Discussed starting dexamethasone to assist in appetite stimulation.   I discussed the importance of continued conversation with family and their medical providers regarding overall plan of care and treatment options, ensuring decisions are within the context of the patients values and GOCs.  PLAN: MS Contin 15 mg twice daily.   Dilaudid 2 mg every 4-6 hours as needed for pain MiraLAX/senna daily for bowel regimen.  Patient knows if no bowel movement within 24 hours to increase MiraLAX to twice daily. Dexamethasone 4mg  daily for appetite stimulation.  Boost protein drinks 1-2/day.  Education provided on focusing on small frequent meals throughout the day versus 3 large meals, eating when he has a desire and snacking.   Patient had not picked up Xarelto that was sent in by Dr. Burr Medico. Wife is asking to be sent in to mail pharmacy as 3 mos supply. Discussed with Dr. Burr Medico and medication sent in as requested.  I will plan to see patient back in 1-2 weeks in collaboration to other oncology appointments.    Patient expressed understanding and was in agreement with this plan. He also understands that He can call the clinic at any time with any questions, concerns, or complaints.   Time Total: 40 min.   Visit consisted of counseling and education dealing with the complex and emotionally intense issues of symptom management and palliative care in the setting of serious and potentially life-threatening illness.Greater than 50%  of this time was spent counseling and coordinating care related to the above assessment and plan.  Signed by: Alda Lea, AGPCNP-BC Palliative Medicine Team

## 2021-11-29 ENCOUNTER — Ambulatory Visit: Payer: Medicare Other

## 2021-11-29 ENCOUNTER — Ambulatory Visit
Admission: RE | Admit: 2021-11-29 | Discharge: 2021-11-29 | Disposition: A | Discharge status: Home / Self Care | Payer: Medicare Other | Source: Ambulatory Visit | Attending: Radiation Oncology | Admitting: Radiation Oncology

## 2021-11-29 ENCOUNTER — Other Ambulatory Visit: Payer: Self-pay

## 2021-11-29 ENCOUNTER — Encounter: Payer: Self-pay | Admitting: Urology

## 2021-11-29 DIAGNOSIS — C7951 Secondary malignant neoplasm of bone: Secondary | ICD-10-CM | POA: Diagnosis not present

## 2021-11-29 DIAGNOSIS — C3431 Malignant neoplasm of lower lobe, right bronchus or lung: Secondary | ICD-10-CM

## 2021-11-29 DIAGNOSIS — G893 Neoplasm related pain (acute) (chronic): Secondary | ICD-10-CM | POA: Diagnosis not present

## 2021-11-29 DIAGNOSIS — Z510 Encounter for antineoplastic radiation therapy: Secondary | ICD-10-CM | POA: Diagnosis not present

## 2021-11-29 DIAGNOSIS — K5900 Constipation, unspecified: Secondary | ICD-10-CM | POA: Diagnosis not present

## 2021-11-30 ENCOUNTER — Encounter (HOSPITAL_COMMUNITY): Payer: Self-pay | Admitting: Hematology

## 2021-11-30 DIAGNOSIS — C3491 Malignant neoplasm of unspecified part of right bronchus or lung: Secondary | ICD-10-CM | POA: Diagnosis not present

## 2021-12-01 ENCOUNTER — Other Ambulatory Visit: Payer: Self-pay

## 2021-12-01 ENCOUNTER — Telehealth: Payer: Self-pay

## 2021-12-01 ENCOUNTER — Other Ambulatory Visit: Payer: Self-pay | Admitting: Hematology

## 2021-12-01 DIAGNOSIS — C7951 Secondary malignant neoplasm of bone: Secondary | ICD-10-CM

## 2021-12-01 NOTE — Telephone Encounter (Signed)
Contacted Dr. Arlean Hopping schedulers at [phone] to schedule pt for Kyphoplasty procedure per staff message conversation between Dr. Burr Medico and Dr. Estanislado Pandy.  Dr. Burr Medico spoke w/Dr. Estanislado Pandy regarding doing kyphoplasty post completion of radiation which ended on 11/29/2021.  LVM for Dr. Arlean Hopping schedulers to contact Dr. Ernestina Penna office.  Sent staff message to both Dr. Burr Medico to place order for Kyphoplasty and Dr. Estanislado Pandy to assist by letting his schedulers know of his conversation with Dr. Burr Medico regarding this procedure.  Awaiting responses from all parties involved.

## 2021-12-02 ENCOUNTER — Other Ambulatory Visit: Payer: Self-pay

## 2021-12-02 DIAGNOSIS — C7951 Secondary malignant neoplasm of bone: Secondary | ICD-10-CM

## 2021-12-03 ENCOUNTER — Other Ambulatory Visit: Payer: Self-pay | Admitting: Hematology

## 2021-12-03 ENCOUNTER — Inpatient Hospital Stay: Payer: Medicare Other

## 2021-12-03 ENCOUNTER — Inpatient Hospital Stay: Payer: Medicare Other | Admitting: Hematology

## 2021-12-03 ENCOUNTER — Encounter: Payer: Self-pay | Admitting: Hematology

## 2021-12-03 ENCOUNTER — Other Ambulatory Visit: Payer: Self-pay

## 2021-12-03 VITALS — BP 108/80 | HR 98 | Temp 98.2°F | Resp 16 | Wt 169.4 lb

## 2021-12-03 DIAGNOSIS — C3490 Malignant neoplasm of unspecified part of unspecified bronchus or lung: Secondary | ICD-10-CM | POA: Diagnosis not present

## 2021-12-03 DIAGNOSIS — C3431 Malignant neoplasm of lower lobe, right bronchus or lung: Secondary | ICD-10-CM | POA: Diagnosis not present

## 2021-12-03 DIAGNOSIS — R350 Frequency of micturition: Secondary | ICD-10-CM | POA: Diagnosis not present

## 2021-12-03 DIAGNOSIS — C7951 Secondary malignant neoplasm of bone: Secondary | ICD-10-CM

## 2021-12-03 DIAGNOSIS — G893 Neoplasm related pain (acute) (chronic): Secondary | ICD-10-CM | POA: Diagnosis not present

## 2021-12-03 LAB — CMP (CANCER CENTER ONLY)
ALT: 19 U/L (ref 0–44)
AST: 14 U/L — ABNORMAL LOW (ref 15–41)
Albumin: 3.6 g/dL (ref 3.5–5.0)
Alkaline Phosphatase: 133 U/L — ABNORMAL HIGH (ref 38–126)
Anion gap: 10 (ref 5–15)
BUN: 29 mg/dL — ABNORMAL HIGH (ref 8–23)
CO2: 26 mmol/L (ref 22–32)
Calcium: 9.9 mg/dL (ref 8.9–10.3)
Chloride: 96 mmol/L — ABNORMAL LOW (ref 98–111)
Creatinine: 0.78 mg/dL (ref 0.61–1.24)
GFR, Estimated: >60 mL/min (ref >60)
Glucose, Bld: 126 mg/dL — ABNORMAL HIGH (ref 70–99)
Potassium: 3.9 mmol/L (ref 3.5–5.1)
Sodium: 132 mmol/L — ABNORMAL LOW (ref 135–145)
Total Bilirubin: 0.7 mg/dL (ref 0.3–1.2)
Total Protein: 6.8 g/dL (ref 6.5–8.1)

## 2021-12-03 LAB — CBC WITH DIFFERENTIAL (CANCER CENTER ONLY)
Abs Immature Granulocytes: 0.62 10*3/uL — ABNORMAL HIGH (ref 0.00–0.07)
Basophils Absolute: 0 10*3/uL (ref 0.0–0.1)
Basophils Relative: 0 %
Eosinophils Absolute: 0.1 10*3/uL (ref 0.0–0.5)
Eosinophils Relative: 1 %
HCT: 40.2 % (ref 39.0–52.0)
Hemoglobin: 14 g/dL (ref 13.0–17.0)
Immature Granulocytes: 5 %
Lymphocytes Relative: 5 %
Lymphs Abs: 0.6 10*3/uL — ABNORMAL LOW (ref 0.7–4.0)
MCH: 30.2 pg (ref 26.0–34.0)
MCHC: 34.8 g/dL (ref 30.0–36.0)
MCV: 86.6 fL (ref 80.0–100.0)
Monocytes Absolute: 1.6 10*3/uL — ABNORMAL HIGH (ref 0.1–1.0)
Monocytes Relative: 13 %
Neutro Abs: 9.4 10*3/uL — ABNORMAL HIGH (ref 1.7–7.7)
Neutrophils Relative %: 76 %
Platelet Count: 234 10*3/uL (ref 150–400)
RBC: 4.64 MIL/uL (ref 4.22–5.81)
RDW: 12.9 % (ref 11.5–15.5)
Smear Review: NORMAL
WBC Count: 12.2 10*3/uL — ABNORMAL HIGH (ref 4.0–10.5)
nRBC: 0 % (ref 0.0–0.2)

## 2021-12-03 MED ORDER — TAMSULOSIN HCL 0.4 MG PO CAPS: 0.4000 mg | ORAL_CAPSULE | Freq: Every day | ORAL | 1 refills | Status: AC

## 2021-12-03 MED ORDER — CYANOCOBALAMIN 1000 MCG/ML IJ SOLN
1000.0000 ug | Freq: Once | INTRAMUSCULAR | Status: AC
  Administered 2021-12-03: 1000 ug via INTRAMUSCULAR
  Filled 2021-12-03: qty 1

## 2021-12-03 MED ORDER — PROCHLORPERAZINE MALEATE 10 MG PO TABS: 10.0000 mg | ORAL_TABLET | Freq: Four times a day (QID) | ORAL | 1 refills | Status: DC | PRN

## 2021-12-03 MED ORDER — ONDANSETRON HCL 8 MG PO TABS: 8.0000 mg | ORAL_TABLET | Freq: Two times a day (BID) | ORAL | 1 refills | Status: DC | PRN

## 2021-12-03 MED ORDER — FOLIC ACID 1 MG PO TABS: 1.0000 mg | ORAL_TABLET | Freq: Every day | ORAL | 0 refills | Status: DC

## 2021-12-03 NOTE — Progress Notes (Signed)
[facility]   [phone] [phone]   Clinic Follow up Note   Patient Care Team: Owens Loffler, MD as PCP - General (Family Medicine) Wellington Hampshire, MD as PCP - Cardiology (Cardiology) Valrie Hart, RN as Oncology Nurse Navigator (Oncology) Pickenpack-Cousar, Carlena Sax, NP as Nurse Practitioner (Nurse Practitioner)  Date of Service:  12/03/2021  CHIEF COMPLAINT: f/u of metastatic adenocarcinoma  CURRENT THERAPY:  Pending first line systemic therapy   ASSESSMENT & PLAN:  Carlos Erickson is a 71 y.o. male with   1. 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 Lumbar spine CT showed multifocal osseous metastatic disease with moderate pathologic fracture of T11. Further work up with CT CAP showed a 6.9 cm cavitary RLL lesion, no lymphadenopathy or other mets  -he underwent bronchoscopy on 11/09/21, pathology showed adenocarcinoma in RLL and level 7 lymph node. -thoracic spine MRI on 11/11/21 showed: numerous levels of vertebral metastatic disease, most notable at T11 and T12 where there is confluent body involvement and pathologic fracture. -brain MRI done the same day was negative -FO on his biopsy showed mutations in Pierce and KRAS. MSI not reported due to insufficient tissue.  Unfortunately, no targeted therapy available. -I ordered Guardant360 to double check if he has targetable mutation. I also requested PD-L1 on his biopsy if we still have adequate tissue -he is scheduled to meet Dr. Estanislado Pandy on 12/07/21 for consideration of kyphoplasty. -we had a long discussion today about his prognosis, treatment options, pain control, quality of life, symptom management and palliative care. We discussed chemotherapy, immunotherapy, and supportive care. We reviewed the balance between treatment benefits and side effects with quality of life.  -due to his  limited PS and concerns of side effects from chemo, I recommend first line chemo Keytruda and Alimta, or Keytruda only if PD-L1 >50%,start in 2 weeks. I will also start him on B12 injections today and oral folic acid to counteract anticipated depletion from chemo. --Chemotherapy consent: Side effects including but does not not limited to, fatigue, nausea, vomiting, diarrhea, hair loss, neuropathy, fluid retention, renal and kidney dysfunction, neutropenic fever, needed for blood transfusion, bleeding, immunotherapy related pneumonitis, thyroid disorder, skin rash, arthritis, other endocrine disorder or autoimmune related disorder, were discussed with patient in great detail.  He voiced a good understanding and agrees to proceed -They know to let us know if they change their mind after talking to Dr. Estanislado Pandy and NP Lexine Baton next week.   2. Pain from Pathologic fracture of T11 and T12 -secondary to #2 -he received palliative radiation under Dr. Tammi Klippel 1/24-11/29/21. -he is on MS Contin 22m BID, gabapentin 300 mg BID, and Dilaudid 224mq4-6hr as needed  3. LUTS -he reports intermittent urine stream, urinary urgency, and nocturia. I called in flomax for him to try.   4. DVT prophylaxis -Due to his immobility, and underlying malignancy, he is at very high risk for DVT -he is on Plavix. He knows to watch for signs of bleeding  -I previously prescribed Xarelto, but he states he did not pick it up due to cost. I recommend he also take 81 mg aspirin daily.    PLAN: -B12 injection today, and he will start folic acid 1 mg daily -I called in Flomax for his urinary issues -increase MS Contin to TID, he will follow-up with palliative care NP NiLexine Batonext week  -lab, f/u, and  chemo Keytruda and Alimta in 2 weeks -Guardant360 was sent out today -I requested PD-L1 on his previous biopsy sample   No problem-specific Assessment & Plan notes found for this encounter.   SUMMARY OF ONCOLOGIC HISTORY: Oncology  History  Bone metastasis ([place])   Initial Diagnosis   Bone metastasis ([place])   10/31/2021 Imaging   EXAM: CT LUMBAR SPINE WITH CONTRAST  IMPRESSION: 1. 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 Moderate pathologic fracture of T11, with suspected epidural and extraosseous extension of tumor there. Consider encroachment on the lower thoracic spinal cord, MRI would be confirmatory. Mild pathologic fracture of T12. L4 and L5 vertebral metastases also suspected.   2. See also CTA abdomen and Pelvis today reported separately.   3. Lumbar spine degeneration with only mild degenerative spinal stenosis.   10/31/2021 Imaging   EXAM: CTA ABDOMEN AND PELVIS WITHOUT AND WITH CONTRAST  IMPRESSION: VASCULAR   1. Negative for abdominal aortic aneurysm, dissection or other acute vascular abnormality. 2. Aortic Atherosclerosis (ICD10-I70.0).   NON-VASCULAR   1. Unfortunately, CT findings are highly concerning for a centrally necrotic 7.4 cm primary bronchogenic carcinoma in the posterior right lower lobe with multifocal osseous metastatic disease including likely pathologic fractures at T11 and T12. Given central necrosis, the primary differential consideration is squamous cell carcinoma. Recommend referral to multi disciplinary thoracic tumor board for further evaluation and tissue diagnosis. 2. Pathologic T11 compression fracture with approximately 40% height loss. 3. Likely subtle pathologic fracture involving the superior endplate of T34 without height loss. 4. Multifocal osseous metastatic disease including at least T9, T11, T12, L4 and L5. 5. No solid organ metastasis identified within the abdomen or pelvis. 6. 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   10/31/2021 Imaging   EXAM: CT CHEST WITH CONTRAST  IMPRESSION: 1. There is a thick-walled subpleural cavitary lesion of the dependent right lower lobe measuring 6.9 cm, highly  concerning for primary lung malignancy. Cavitary infection is however a general differential consideration. 2. Wedge deformity of T11 and superior endplate deformity of K87 with subtle underlying lytic character, highly concerning for osseous metastatic disease and pathologic fracture. 3. Additional small lucent focus at the left aspect of T9, suspicious for an additional metastatic lesion. 4. No evidence of lymphadenopathy in the chest. 5. Emphysema. 6. Coronary artery disease.   Aortic Atherosclerosis (ICD10-I70.0) and Emphysema (ICD10-J43.9).   11/09/2021 Initial Biopsy   FINAL MICROSCOPIC DIAGNOSIS:  A. LUNG, RLL, FINE NEEDLE ASPIRATION AND BIOPSIES:  - Adenocarcinoma.  See comment.  B. LUNG, RLL, BRUSH:  - Adenocarcinoma.   FINAL MICROSCOPIC DIAGNOSIS:  C. LYMPH NODE, 7, FINE NEEDLE ASPIRATION:  - Adenocarcinoma.  COMMENT:  The cells stain positively for cytokeratin 7 and cytokeratin 20, and  have patchy positivity for CDX2.  TTF-1 and p40 immunostains are negative.  The differential diagnosis includes primary pulmonary adenocarcinoma as well as a GI primary.   11/11/2021 Imaging   EXAM: MRI THORACIC WITHOUT AND WITH CONTRAST  IMPRESSION: Numerous levels of vertebral metastatic disease (listed above) most notable at T11 and T12 where there is confluent body involvement and pathologic fracture. Body fracture at T11 causes 60% height loss and retropulsion with ventral epidural tumor contacting but not compressing the cord.   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 He was last seen by me on 11/19/21. He presents to the clinic accompanied by his wife and daughter. He reports his pain is improved but remains constant. He also reports frequent urination and adds that urinating will help alleviate his pain. He notes that the frequent urination will wake him up. He reports between this and the pain, he does not sleep. He reports the gabapentin helps his pain but gives him a mild headache.   All other systems were reviewed with the patient and are negative.  MEDICAL HISTORY:  Past Medical History:  Diagnosis Date   Arthritis    Barrett's esophagus 07/04/2016   pt states he was told he does not have this.   CAD (coronary artery disease), native coronary artery 05/2014   Non-ST elevation myocardial infarction. Echocardiogram showed normal LV systolic function with mild to moderate mitral regurgitation. Cardiac catheterization showed significant two-vessel coronary artery disease including the RCA and left anterior descending artery. He underwent CABG at [facility].   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   Sleep apnea 03/2019   on CPAP    SURGICAL HISTORY: Past Surgical History:  Procedure Laterality Date   BRONCHIAL BIOPSY  11/09/2021   Procedure: BRONCHIAL BIOPSIES;  Surgeon: Garner Nash, DO;  Location: [facility];  Service: Pulmonary;;   BRONCHIAL BRUSHINGS  11/09/2021   Procedure: BRONCHIAL BRUSHINGS;  Surgeon: Garner Nash, DO;  Location: [facility];  Service: Pulmonary;;   BRONCHIAL NEEDLE ASPIRATION BIOPSY  11/09/2021   Procedure:  BRONCHIAL NEEDLE ASPIRATION BIOPSIES;  Surgeon: Garner Nash, DO;  Location: [facility];  Service: Pulmonary;;   CARDIAC CATHETERIZATION  03/2014   [facility]   cataract Right 1980   Dr. Katy Fitch   CORONARY ARTERY BYPASS GRAFT  05/2014   [facility], CABG x 2   EYE SURGERY     HEMORROIDECTOMY     LUNG BIOPSY  2006   [facility]   VIDEO BRONCHOSCOPY WITH ENDOBRONCHIAL ULTRASOUND  11/09/2021   Procedure: VIDEO BRONCHOSCOPY WITH ENDOBRONCHIAL ULTRASOUND;  Surgeon: Garner Nash, DO;  Location: [facility];  Service: Pulmonary;;    I have reviewed the social history and family history with the patient and they are unchanged from previous note.  ALLERGIES:  is allergic to plasma protein fraction, icosapent ethyl, and morphine and related.  MEDICATIONS:  Current Outpatient Medications  Medication Sig Dispense Refill   folic acid (FOLVITE) 1 MG tablet Take 1 tablet (1 mg total) by mouth daily. 30 tablet 0   tamsulosin (FLOMAX) 0.4 MG CAPS capsule Take 1 capsule (0.4 mg total) by mouth daily after supper. 30 capsule 1   acetaminophen (TYLENOL) 325 MG tablet Take 2 tablets (650 mg total) by mouth every 6 (six) hours as needed for up to 30 doses for mild pain  or moderate pain. 30 tablet 0   acetaminophen (TYLENOL) 500 MG tablet Take 500 mg by mouth every 6 (six) hours as needed for moderate pain or headache.     amLODipine (NORVASC) 10 MG tablet TAKE 1 TABLET BY MOUTH  DAILY (Patient taking differently: Take 10 mg by mouth daily.) 90 tablet 3   atorvastatin (LIPITOR) 20 MG tablet TAKE 1 TABLET BY MOUTH ONCE DAILY (Patient taking differently: Take 20 mg by mouth daily.) 90 tablet 3   carvedilol (COREG) 6.25 MG tablet TAKE 1 TABLET BY MOUTH  TWICE DAILY (Patient taking differently: Take 6.25 mg by mouth 2 (two) times daily with a meal.) 180 tablet 3   clopidogrel (PLAVIX) 75 MG tablet TAKE 1 TABLET BY MOUTH ONCE DAILY (Patient taking differently: Take 75 mg by mouth daily.) 90 tablet 1   dexamethasone (DECADRON) 4  MG tablet Take 1 tablet (4 mg total) by mouth daily. 30 tablet 0   Evolocumab (REPATHA SURECLICK) 893 MG/ML SOAJ Inject 1 pen into the skin every 14 (fourteen) days. (Patient not taking: Reported on 10/31/2021) 6 mL 3   gabapentin (NEURONTIN) 300 MG capsule Take 1 capsule (300 mg total) by mouth 2 (two) times daily. 60 capsule 1   HYDROmorphone (DILAUDID) 2 MG tablet Take 1 tablet (2 mg total) by mouth every 4 (four) hours as needed for up to 21 days for severe pain or moderate pain. 60 tablet 0   losartan (COZAAR) 100 MG tablet TAKE 1 TABLET BY MOUTH  DAILY (Patient taking differently: Take 100 mg by mouth daily.) 90 tablet 3   morphine (MS CONTIN) 15 MG 12 hr tablet Take 1 tablet (15 mg total) by mouth every 12 (twelve) hours. 60 tablet 0   omeprazole (PRILOSEC) 20 MG capsule Take 1 capsule (20 mg total) by mouth daily. 30 capsule 2   No current facility-administered medications for this visit.    PHYSICAL EXAMINATION: ECOG PERFORMANCE STATUS: 3 - Symptomatic, >50% confined to bed  Vitals:   12/03/21 1050  BP: 108/80  Pulse: 98  Resp: 16  Temp: 98.2 F (36.8 C)  SpO2: 90%   Wt Readings from Last 3 Encounters:  12/03/21 169 lb 7 oz (76.9 kg)  11/26/21 172 lb (78 kg)  11/17/21 180 lb 3.2 oz (81.7 kg)    GENERAL:alert, no distress and comfortable SKIN: skin color normal, no rashes or significant lesions EYES: normal, Conjunctiva are pink and non-injected, sclera clear  NEURO: alert & oriented x 3 with fluent speech  LABORATORY DATA:  I have reviewed the data as listed CBC Latest Ref Rng & Units 12/03/2021 11/09/2021 10/31/2021  WBC 4.0 - 10.5 K/uL 12.2(H) 9.4 13.5(H)  Hemoglobin 13.0 - 17.0 g/dL 14.0 14.7 15.6  Hematocrit 39.0 - 52.0 % 40.2 44.2 48.2  Platelets 150 - 400 K/uL 234 304 290     CMP Latest Ref Rng & Units 12/03/2021 11/09/2021 10/31/2021  Glucose 70 - 99 mg/dL 126(H) 109(H) 123(H)  BUN 8 - 23 mg/dL 29(H) 14 24(H)  Creatinine 0.61 - 1.24 mg/dL 0.78 0.90 1.06  Sodium  135 - 145 mmol/L 132(L) 133(L) 136  Potassium 3.5 - 5.1 mmol/L 3.9 4.9 4.0  Chloride 98 - 111 mmol/L 96(L) 97(L) 97(L)  CO2 22 - 32 mmol/L _0 Calcium 8.9 - 10.3 mg/dL 9.9 9.1 9.5  Total Protein 6.5 - 8.1 g/dL 6.8 - -  Total Bilirubin 0.3 - 1.2 mg/dL 0.7 - -  Alkaline Phos 38 - 126 U/L 133(H) - -  AST 15 - 41 U/L 14(L) - -  ALT 0 - 44 U/L 19 - -      RADIOGRAPHIC STUDIES: I have personally reviewed the radiological images as listed and agreed with the findings in the report. No results found.    No orders of the defined types were placed in this encounter.  All questions were answered. The patient knows to call the clinic with any problems, questions or concerns. No barriers to learning was detected. The total time spent in the appointment was 60 minutes.     Truitt Merle, MD 12/03/2021   I, Wilburn Mylar, am acting as scribe for Truitt Merle, MD.   I have reviewed the above documentation for accuracy and completeness, and I agree with the above.

## 2021-12-03 NOTE — Progress Notes (Signed)
START ON PATHWAY REGIMEN - Non-Small Cell Lung     A cycle is every 21 days:     Pembrolizumab      Pemetrexed      Carboplatin   **Always confirm dose/schedule in your pharmacy ordering system**  Patient Characteristics: Stage IV Metastatic, Nonsquamous, Molecular Analysis Completed, Molecular Alteration Present and Targeted Therapy Exhausted OR EGFR Exon 20+ or KRAS G12C+ or HER2+ Present and No Prior Chemo/Immunotherapy OR No Alteration Present, Initial  Chemotherapy/Immunotherapy, PS = 2, No Alteration Present, No Alteration Present, PD-L1 Expression Positive 1-49% (TPS) / Negative / Not Tested / Awaiting Test Results Therapeutic Status: Stage IV Metastatic Histology: Nonsquamous Cell Broad Molecular Profiling Status: Molecular Analysis Completed Molecular Analysis Results: No Alteration Present ECOG Performance Status: 2 Chemotherapy/Immunotherapy Line of Therapy: Initial Chemotherapy/Immunotherapy EGFR Exons 18-21 Mutation Testing Status: Completed and Negative ALK Fusion/Rearrangement Testing Status: Completed and Negative BRAF V600 Mutation Testing Status: Completed and Negative KRAS G12C Mutation Testing Status: Completed and Negative MET Exon 14 Mutation Testing Status: Completed and Negative RET Fusion/Rearrangement Testing Status: Completed and Negative HER2 Mutation Testing Status: Completed and Negative NTRK Fusion/Rearrangement Testing Status: Completed and Negative ROS1 Fusion/Rearrangement Testing Status: Completed and Negative PD-L1 Expression Status: Awaiting Test Results Intent of Therapy: Non-Curative / Palliative Intent, Discussed with Patient 

## 2021-12-06 ENCOUNTER — Telehealth: Payer: Self-pay | Admitting: Hematology

## 2021-12-06 NOTE — Telephone Encounter (Signed)
Scheduled follow-up appointments per 2/10 los. Patient is aware.

## 2021-12-07 ENCOUNTER — Other Ambulatory Visit: Payer: Self-pay

## 2021-12-07 ENCOUNTER — Ambulatory Visit (HOSPITAL_COMMUNITY)
Admission: RE | Admit: 2021-12-07 | Discharge: 2021-12-07 | Disposition: A | Discharge status: Home / Self Care | Payer: Medicare Other | Source: Ambulatory Visit | Attending: Hematology | Admitting: Hematology

## 2021-12-07 DIAGNOSIS — C7951 Secondary malignant neoplasm of bone: Secondary | ICD-10-CM

## 2021-12-07 DIAGNOSIS — M8458XA Pathological fracture in neoplastic disease, other specified site, initial encounter for fracture: Secondary | ICD-10-CM | POA: Diagnosis not present

## 2021-12-08 ENCOUNTER — Encounter (HOSPITAL_COMMUNITY): Payer: Self-pay | Admitting: Hematology

## 2021-12-08 DIAGNOSIS — C7951 Secondary malignant neoplasm of bone: Secondary | ICD-10-CM | POA: Diagnosis not present

## 2021-12-08 DIAGNOSIS — C3431 Malignant neoplasm of lower lobe, right bronchus or lung: Secondary | ICD-10-CM | POA: Diagnosis not present

## 2021-12-08 DIAGNOSIS — C3490 Malignant neoplasm of unspecified part of unspecified bronchus or lung: Secondary | ICD-10-CM | POA: Diagnosis not present

## 2021-12-08 HISTORY — PX: IR RADIOLOGIST EVAL & MGMT: IMG5224

## 2021-12-09 ENCOUNTER — Other Ambulatory Visit (HOSPITAL_COMMUNITY): Payer: Self-pay | Admitting: Interventional Radiology

## 2021-12-09 ENCOUNTER — Inpatient Hospital Stay: Payer: Medicare Other

## 2021-12-09 ENCOUNTER — Other Ambulatory Visit: Payer: Self-pay

## 2021-12-09 DIAGNOSIS — C7951 Secondary malignant neoplasm of bone: Secondary | ICD-10-CM

## 2021-12-09 DIAGNOSIS — S32020A Wedge compression fracture of second lumbar vertebra, initial encounter for closed fracture: Secondary | ICD-10-CM

## 2021-12-09 DIAGNOSIS — S22080A Wedge compression fracture of T11-T12 vertebra, initial encounter for closed fracture: Secondary | ICD-10-CM

## 2021-12-10 ENCOUNTER — Inpatient Hospital Stay (HOSPITAL_COMMUNITY)
Admission: EM | Admit: 2021-12-10 | Discharge: 2021-12-18 | DRG: 515 | Disposition: A | Discharge status: Hospice | Payer: Medicare Other | Attending: Internal Medicine | Admitting: Internal Medicine

## 2021-12-10 ENCOUNTER — Inpatient Hospital Stay: Payer: Medicare Other | Admitting: Nurse Practitioner

## 2021-12-10 ENCOUNTER — Emergency Department (HOSPITAL_COMMUNITY): Payer: Medicare Other

## 2021-12-10 ENCOUNTER — Other Ambulatory Visit: Payer: Self-pay

## 2021-12-10 DIAGNOSIS — F419 Anxiety disorder, unspecified: Secondary | ICD-10-CM | POA: Diagnosis present

## 2021-12-10 DIAGNOSIS — M1990 Unspecified osteoarthritis, unspecified site: Secondary | ICD-10-CM | POA: Diagnosis not present

## 2021-12-10 DIAGNOSIS — C3490 Malignant neoplasm of unspecified part of unspecified bronchus or lung: Secondary | ICD-10-CM

## 2021-12-10 DIAGNOSIS — Z7902 Long term (current) use of antithrombotics/antiplatelets: Secondary | ICD-10-CM

## 2021-12-10 DIAGNOSIS — Z923 Personal history of irradiation: Secondary | ICD-10-CM

## 2021-12-10 DIAGNOSIS — Z951 Presence of aortocoronary bypass graft: Secondary | ICD-10-CM

## 2021-12-10 DIAGNOSIS — J90 Pleural effusion, not elsewhere classified: Secondary | ICD-10-CM

## 2021-12-10 DIAGNOSIS — Z66 Do not resuscitate: Secondary | ICD-10-CM | POA: Diagnosis not present

## 2021-12-10 DIAGNOSIS — Z981 Arthrodesis status: Secondary | ICD-10-CM | POA: Diagnosis not present

## 2021-12-10 DIAGNOSIS — I2510 Atherosclerotic heart disease of native coronary artery without angina pectoris: Secondary | ICD-10-CM | POA: Diagnosis present

## 2021-12-10 DIAGNOSIS — Z7401 Bed confinement status: Secondary | ICD-10-CM | POA: Diagnosis not present

## 2021-12-10 DIAGNOSIS — Z823 Family history of stroke: Secondary | ICD-10-CM

## 2021-12-10 DIAGNOSIS — R5381 Other malaise: Secondary | ICD-10-CM | POA: Diagnosis present

## 2021-12-10 DIAGNOSIS — K5900 Constipation, unspecified: Secondary | ICD-10-CM | POA: Diagnosis present

## 2021-12-10 DIAGNOSIS — K5730 Diverticulosis of large intestine without perforation or abscess without bleeding: Secondary | ICD-10-CM | POA: Diagnosis not present

## 2021-12-10 DIAGNOSIS — I10 Essential (primary) hypertension: Secondary | ICD-10-CM | POA: Diagnosis not present

## 2021-12-10 DIAGNOSIS — J189 Pneumonia, unspecified organism: Secondary | ICD-10-CM | POA: Diagnosis present

## 2021-12-10 DIAGNOSIS — T380X5A Adverse effect of glucocorticoids and synthetic analogues, initial encounter: Secondary | ICD-10-CM | POA: Diagnosis present

## 2021-12-10 DIAGNOSIS — Z20822 Contact with and (suspected) exposure to covid-19: Secondary | ICD-10-CM | POA: Diagnosis present

## 2021-12-10 DIAGNOSIS — R6889 Other general symptoms and signs: Secondary | ICD-10-CM | POA: Diagnosis not present

## 2021-12-10 DIAGNOSIS — E876 Hypokalemia: Secondary | ICD-10-CM | POA: Diagnosis not present

## 2021-12-10 DIAGNOSIS — R000 Tachycardia, unspecified: Secondary | ICD-10-CM | POA: Diagnosis not present

## 2021-12-10 DIAGNOSIS — A419 Sepsis, unspecified organism: Secondary | ICD-10-CM | POA: Diagnosis not present

## 2021-12-10 DIAGNOSIS — G9341 Metabolic encephalopathy: Secondary | ICD-10-CM | POA: Diagnosis present

## 2021-12-10 DIAGNOSIS — I700 Atherosclerosis of aorta: Secondary | ICD-10-CM | POA: Diagnosis not present

## 2021-12-10 DIAGNOSIS — Z781 Physical restraint status: Secondary | ICD-10-CM

## 2021-12-10 DIAGNOSIS — M8448XA Pathological fracture, other site, initial encounter for fracture: Secondary | ICD-10-CM

## 2021-12-10 DIAGNOSIS — E871 Hypo-osmolality and hyponatremia: Secondary | ICD-10-CM | POA: Diagnosis not present

## 2021-12-10 DIAGNOSIS — E875 Hyperkalemia: Secondary | ICD-10-CM | POA: Diagnosis not present

## 2021-12-10 DIAGNOSIS — E43 Unspecified severe protein-calorie malnutrition: Secondary | ICD-10-CM | POA: Insufficient documentation

## 2021-12-10 DIAGNOSIS — E785 Hyperlipidemia, unspecified: Secondary | ICD-10-CM | POA: Diagnosis not present

## 2021-12-10 DIAGNOSIS — Z7189 Other specified counseling: Secondary | ICD-10-CM | POA: Diagnosis not present

## 2021-12-10 DIAGNOSIS — Z885 Allergy status to narcotic agent status: Secondary | ICD-10-CM

## 2021-12-10 DIAGNOSIS — Z87891 Personal history of nicotine dependence: Secondary | ICD-10-CM

## 2021-12-10 DIAGNOSIS — Z419 Encounter for procedure for purposes other than remedying health state, unspecified: Secondary | ICD-10-CM

## 2021-12-10 DIAGNOSIS — R0902 Hypoxemia: Secondary | ICD-10-CM

## 2021-12-10 DIAGNOSIS — G4700 Insomnia, unspecified: Secondary | ICD-10-CM | POA: Diagnosis present

## 2021-12-10 DIAGNOSIS — M8458XA Pathological fracture in neoplastic disease, other specified site, initial encounter for fracture: Secondary | ICD-10-CM | POA: Diagnosis not present

## 2021-12-10 DIAGNOSIS — C787 Secondary malignant neoplasm of liver and intrahepatic bile duct: Secondary | ICD-10-CM | POA: Diagnosis not present

## 2021-12-10 DIAGNOSIS — M4858XA Collapsed vertebra, not elsewhere classified, sacral and sacrococcygeal region, initial encounter for fracture: Secondary | ICD-10-CM | POA: Diagnosis not present

## 2021-12-10 DIAGNOSIS — C3491 Malignant neoplasm of unspecified part of right bronchus or lung: Secondary | ICD-10-CM | POA: Diagnosis not present

## 2021-12-10 DIAGNOSIS — Z515 Encounter for palliative care: Secondary | ICD-10-CM

## 2021-12-10 DIAGNOSIS — Z9889 Other specified postprocedural states: Secondary | ICD-10-CM

## 2021-12-10 DIAGNOSIS — C7951 Secondary malignant neoplasm of bone: Secondary | ICD-10-CM

## 2021-12-10 DIAGNOSIS — N2889 Other specified disorders of kidney and ureter: Secondary | ICD-10-CM | POA: Diagnosis not present

## 2021-12-10 DIAGNOSIS — M4324 Fusion of spine, thoracic region: Secondary | ICD-10-CM | POA: Diagnosis not present

## 2021-12-10 DIAGNOSIS — Z833 Family history of diabetes mellitus: Secondary | ICD-10-CM

## 2021-12-10 DIAGNOSIS — R0689 Other abnormalities of breathing: Secondary | ICD-10-CM | POA: Diagnosis not present

## 2021-12-10 DIAGNOSIS — J9601 Acute respiratory failure with hypoxia: Secondary | ICD-10-CM

## 2021-12-10 DIAGNOSIS — D649 Anemia, unspecified: Secondary | ICD-10-CM | POA: Diagnosis present

## 2021-12-10 DIAGNOSIS — R627 Adult failure to thrive: Secondary | ICD-10-CM | POA: Diagnosis not present

## 2021-12-10 DIAGNOSIS — R0609 Other forms of dyspnea: Secondary | ICD-10-CM | POA: Diagnosis not present

## 2021-12-10 DIAGNOSIS — I340 Nonrheumatic mitral (valve) insufficiency: Secondary | ICD-10-CM | POA: Diagnosis not present

## 2021-12-10 DIAGNOSIS — M549 Dorsalgia, unspecified: Secondary | ICD-10-CM | POA: Diagnosis not present

## 2021-12-10 DIAGNOSIS — Z8249 Family history of ischemic heart disease and other diseases of the circulatory system: Secondary | ICD-10-CM

## 2021-12-10 DIAGNOSIS — Z79899 Other long term (current) drug therapy: Secondary | ICD-10-CM

## 2021-12-10 DIAGNOSIS — J910 Malignant pleural effusion: Secondary | ICD-10-CM | POA: Diagnosis not present

## 2021-12-10 DIAGNOSIS — R0600 Dyspnea, unspecified: Secondary | ICD-10-CM | POA: Diagnosis not present

## 2021-12-10 DIAGNOSIS — Z743 Need for continuous supervision: Secondary | ICD-10-CM | POA: Diagnosis not present

## 2021-12-10 DIAGNOSIS — J9811 Atelectasis: Secondary | ICD-10-CM | POA: Diagnosis not present

## 2021-12-10 DIAGNOSIS — R739 Hyperglycemia, unspecified: Secondary | ICD-10-CM | POA: Diagnosis present

## 2021-12-10 DIAGNOSIS — K219 Gastro-esophageal reflux disease without esophagitis: Secondary | ICD-10-CM | POA: Diagnosis present

## 2021-12-10 DIAGNOSIS — R059 Cough, unspecified: Secondary | ICD-10-CM | POA: Diagnosis not present

## 2021-12-10 DIAGNOSIS — Z888 Allergy status to other drugs, medicaments and biological substances status: Secondary | ICD-10-CM

## 2021-12-10 DIAGNOSIS — G893 Neoplasm related pain (acute) (chronic): Secondary | ICD-10-CM | POA: Diagnosis not present

## 2021-12-10 DIAGNOSIS — I499 Cardiac arrhythmia, unspecified: Secondary | ICD-10-CM | POA: Diagnosis not present

## 2021-12-10 DIAGNOSIS — R410 Disorientation, unspecified: Secondary | ICD-10-CM | POA: Diagnosis not present

## 2021-12-10 DIAGNOSIS — Z7982 Long term (current) use of aspirin: Secondary | ICD-10-CM

## 2021-12-10 DIAGNOSIS — R404 Transient alteration of awareness: Secondary | ICD-10-CM | POA: Diagnosis not present

## 2021-12-10 DIAGNOSIS — Z7952 Long term (current) use of systemic steroids: Secondary | ICD-10-CM

## 2021-12-10 DIAGNOSIS — K117 Disturbances of salivary secretion: Secondary | ICD-10-CM | POA: Diagnosis present

## 2021-12-10 DIAGNOSIS — I252 Old myocardial infarction: Secondary | ICD-10-CM

## 2021-12-10 LAB — CBC
HCT: 32.7 % — ABNORMAL LOW (ref 39.0–52.0)
Hemoglobin: 11.2 g/dL — ABNORMAL LOW (ref 13.0–17.0)
MCH: 29.7 pg (ref 26.0–34.0)
MCHC: 34.3 g/dL (ref 30.0–36.0)
MCV: 86.7 fL (ref 80.0–100.0)
Platelets: 177 10*3/uL (ref 150–400)
RBC: 3.77 MIL/uL — ABNORMAL LOW (ref 4.22–5.81)
RDW: 13.2 % (ref 11.5–15.5)
WBC: 13.5 10*3/uL — ABNORMAL HIGH (ref 4.0–10.5)
nRBC: 0.2 % (ref 0.0–0.2)

## 2021-12-10 LAB — RESP PANEL BY RT-PCR (FLU A&B, COVID) ARPGX2
Influenza A by PCR: NEGATIVE
Influenza B by PCR: NEGATIVE
SARS Coronavirus 2 by RT PCR: NEGATIVE

## 2021-12-10 LAB — COMPREHENSIVE METABOLIC PANEL
ALT: 25 U/L (ref 0–44)
AST: 23 U/L (ref 15–41)
Albumin: 2.6 g/dL — ABNORMAL LOW (ref 3.5–5.0)
Alkaline Phosphatase: 141 U/L — ABNORMAL HIGH (ref 38–126)
Anion gap: 9 (ref 5–15)
BUN: 19 mg/dL (ref 8–23)
CO2: 22 mmol/L (ref 22–32)
Calcium: 8.6 mg/dL — ABNORMAL LOW (ref 8.9–10.3)
Chloride: 96 mmol/L — ABNORMAL LOW (ref 98–111)
Creatinine, Ser: 0.59 mg/dL — ABNORMAL LOW (ref 0.61–1.24)
GFR, Estimated: >60 mL/min (ref >60)
Glucose, Bld: 122 mg/dL — ABNORMAL HIGH (ref 70–99)
Potassium: 3.3 mmol/L — ABNORMAL LOW (ref 3.5–5.1)
Sodium: 127 mmol/L — ABNORMAL LOW (ref 135–145)
Total Bilirubin: 1 mg/dL (ref 0.3–1.2)
Total Protein: 6.5 g/dL (ref 6.5–8.1)

## 2021-12-10 LAB — APTT: aPTT: 27 seconds (ref 24–36)

## 2021-12-10 LAB — BLOOD GAS, VENOUS
Acid-Base Excess: 3.1 mmol/L — ABNORMAL HIGH (ref 0.0–2.0)
Bicarbonate: 24.5 mmol/L (ref 20.0–28.0)
O2 Saturation: 87.1 %
Patient temperature: 37
pCO2, Ven: 28 mmHg — ABNORMAL LOW (ref 44–60)
pH, Ven: 7.55 — ABNORMAL HIGH (ref 7.25–7.43)
pO2, Ven: 53 mmHg — ABNORMAL HIGH (ref 32–45)

## 2021-12-10 LAB — PROTIME-INR
INR: 1.1 (ref 0.8–1.2)
Prothrombin Time: 14.3 seconds (ref 11.4–15.2)

## 2021-12-10 LAB — URINALYSIS, ROUTINE W REFLEX MICROSCOPIC
Bilirubin Urine: NEGATIVE
Glucose, UA: NEGATIVE mg/dL
Hgb urine dipstick: NEGATIVE
Ketones, ur: 5 mg/dL — AB
Leukocytes,Ua: NEGATIVE
Nitrite: NEGATIVE
Protein, ur: NEGATIVE mg/dL
Specific Gravity, Urine: 1.012 (ref 1.005–1.030)
pH: 7 (ref 5.0–8.0)

## 2021-12-10 LAB — LACTIC ACID, PLASMA
Lactic Acid, Venous: 1.4 mmol/L (ref 0.5–1.9)
Lactic Acid, Venous: 1.2 mmol/L (ref 0.5–1.9)

## 2021-12-10 LAB — TROPONIN I (HIGH SENSITIVITY)
Troponin I (High Sensitivity): 7 ng/L (ref <18)
Troponin I (High Sensitivity): 10 ng/L (ref <18)

## 2021-12-10 LAB — SODIUM, URINE, RANDOM: Sodium, Ur: 101 mmol/L

## 2021-12-10 LAB — CBG MONITORING, ED: Glucose-Capillary: 133 mg/dL — ABNORMAL HIGH (ref 70–99)

## 2021-12-10 IMAGING — CT CT HEAD W/O CM
3 series · 15 of 47 positions shown, 18 images · non-contrast
Comparison: Brain MRI [DATE]

CLINICAL DATA: Altered mental status, recent diagnosis of non-small
cell lung cancer



[Series 2: head wo · axial · 0.47mm/px · z∈[-72,+58]mm · 9 of 32 slices shown, 12 images]
[im 3/32  brain]
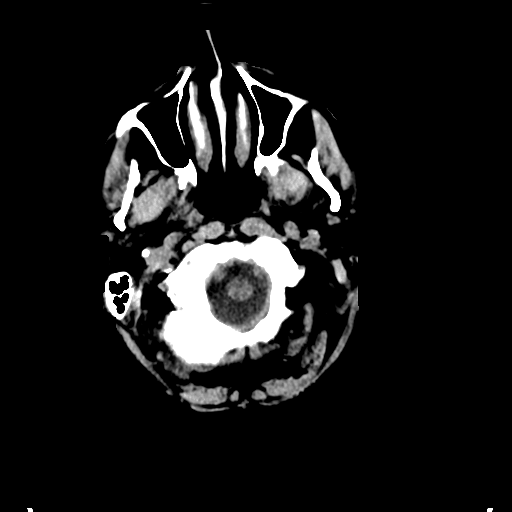
[im 3/32  bone]
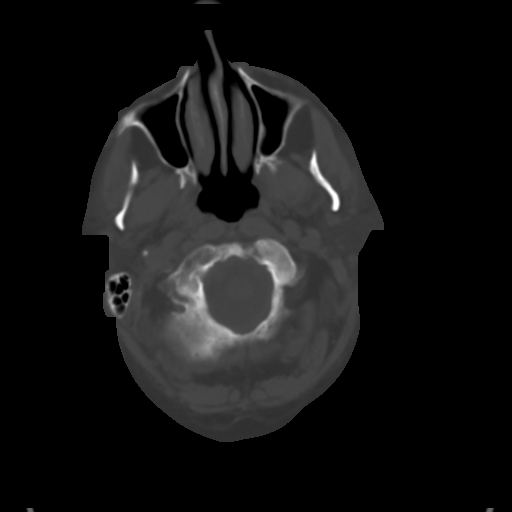
[im 6/32  brain]
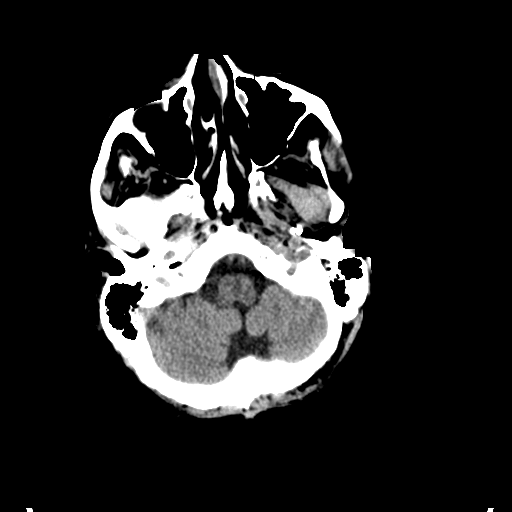
[im 9/32  brain]
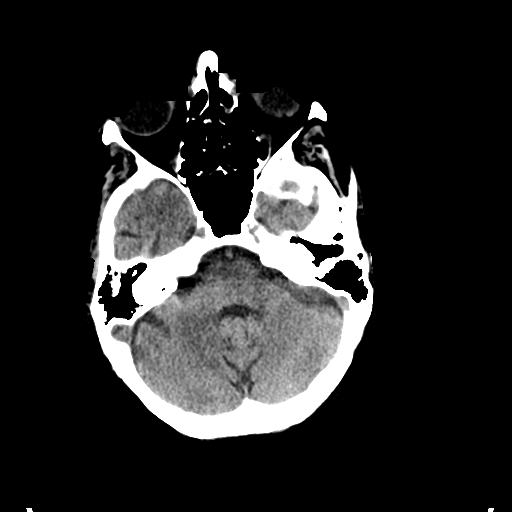
[im 12/32  brain]
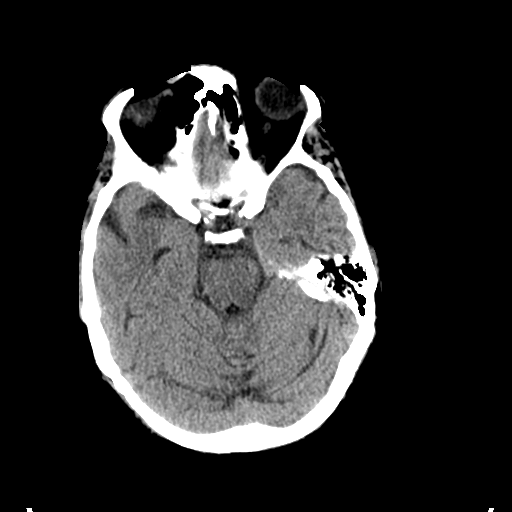
[im 17/32  brain]
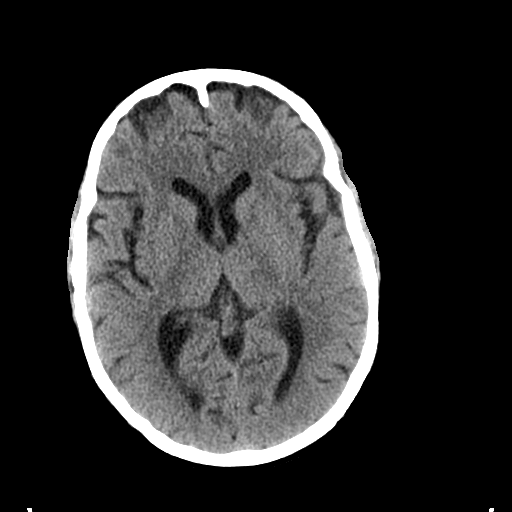
[im 17/32  bone]
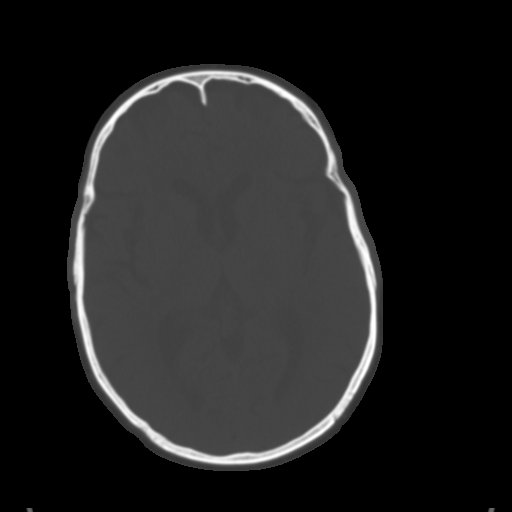
[im 20/32  brain]
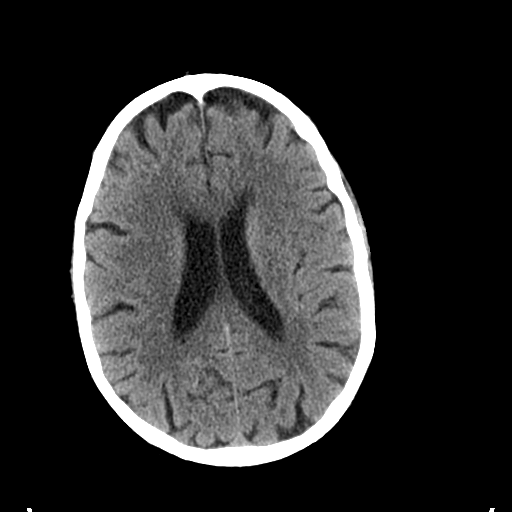
[im 23/32  brain]
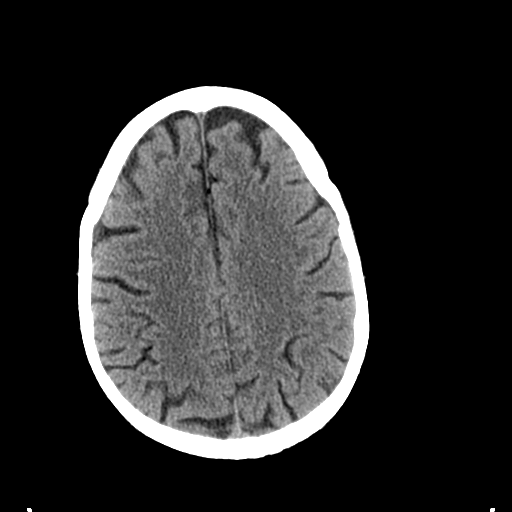
[im 26/32  brain]
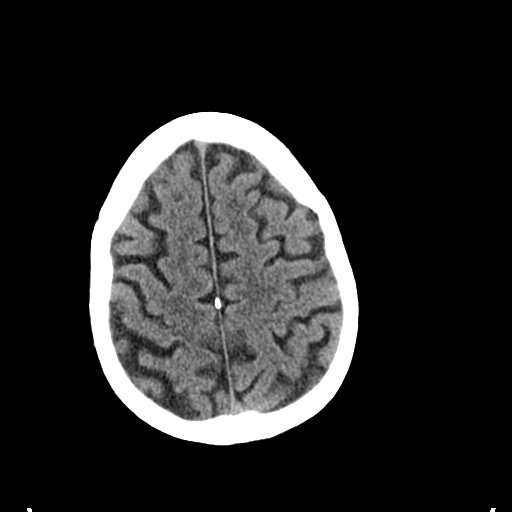
[im 29/32  brain]
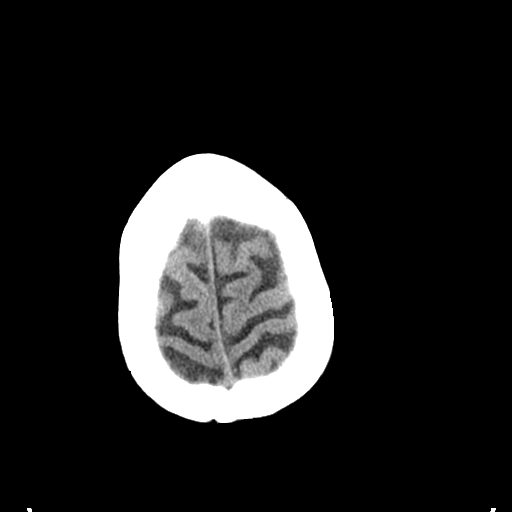
[im 29/32  bone]
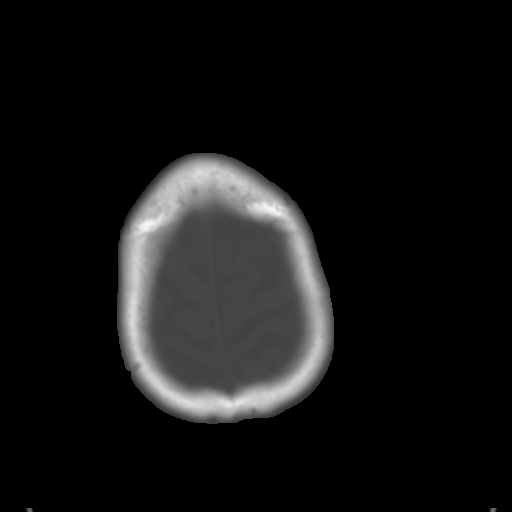

[Series 5: coronal soft tissue · coronal · 0.36mm/px · 3 of 68 slices shown]
[im 23/68  brain]
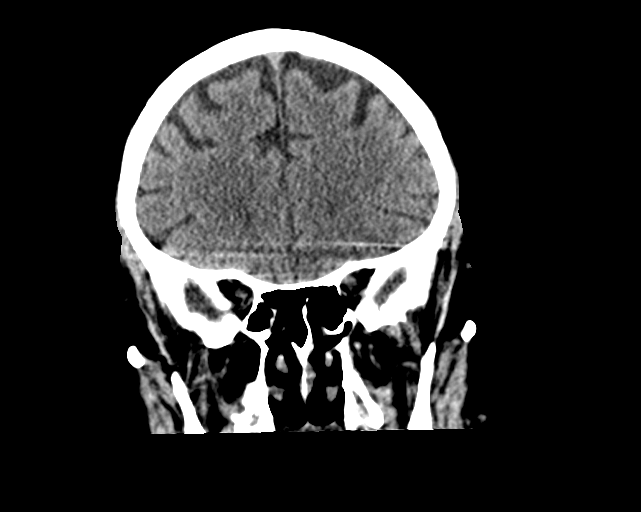
[im 30/68  brain]
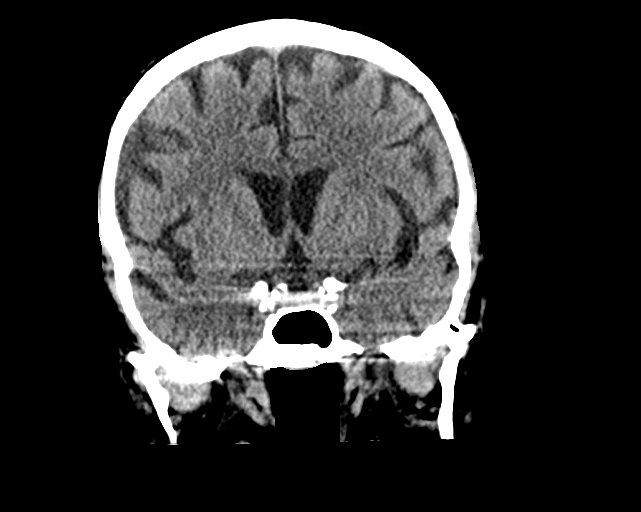
[im 38/68  brain]
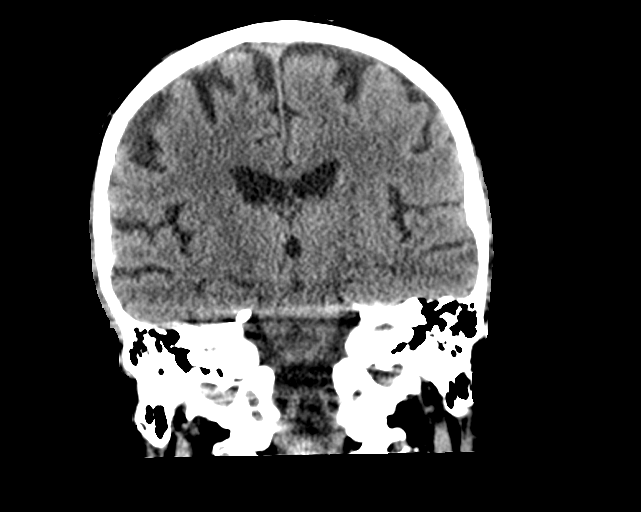

[Series 6: sagittal soft tissue · sagittal · 0.41mm/px · 3 of 48 slices shown]
[im 16/48  brain]
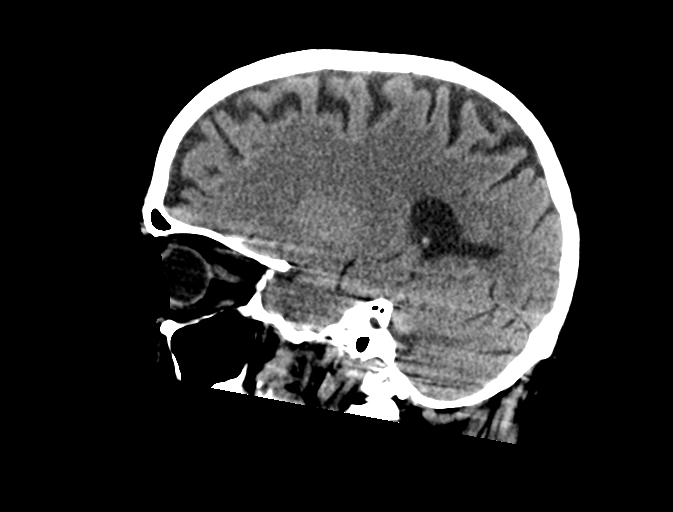
[im 24/48  brain]
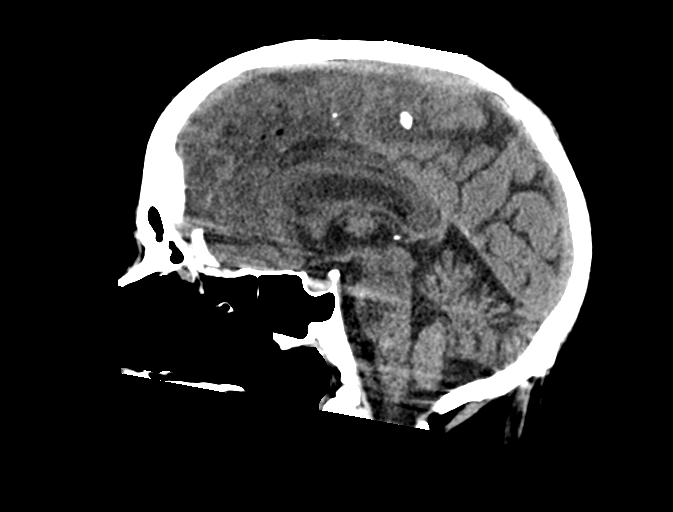
[im 32/48  brain]
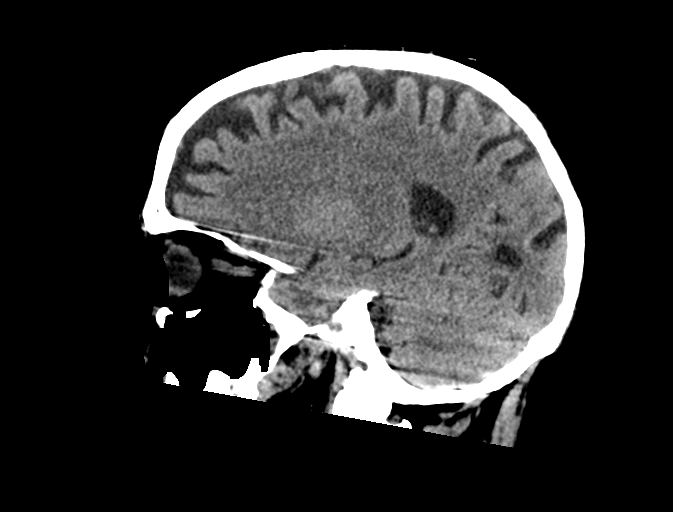

[15 of 47 positions shown; findings below may reference images not displayed]

FINDINGS: Brain: There is no evidence of acute intracranial hemorrhage,
extra-axial fluid collection, or acute infarct.

Parenchymal volume is normal. The ventricles are normal in size.
Gray-white differentiation is preserved. There is no significant
burden of white matter microangiopathic change.

There is no mass lesion.  There is no midline shift.

Vascular: There is calcification of the bilateral cavernous ICAs.

Skull: Normal. Negative for fracture or focal lesion.

Sinuses/Orbits: The paranasal sinuses are clear. A right lens
implant is noted. The globes and orbits are otherwise unremarkable.

Other: None.
IMPRESSION: No acute intracranial pathology.

## 2021-12-10 IMAGING — DX DG CHEST 1V PORT
1 series · 1 of 1 positions shown · non-contrast
Comparison: [DATE].

CLINICAL DATA: Sepsis.

EXAM:
PORTABLE CHEST 1 VIEW

[chest ap]
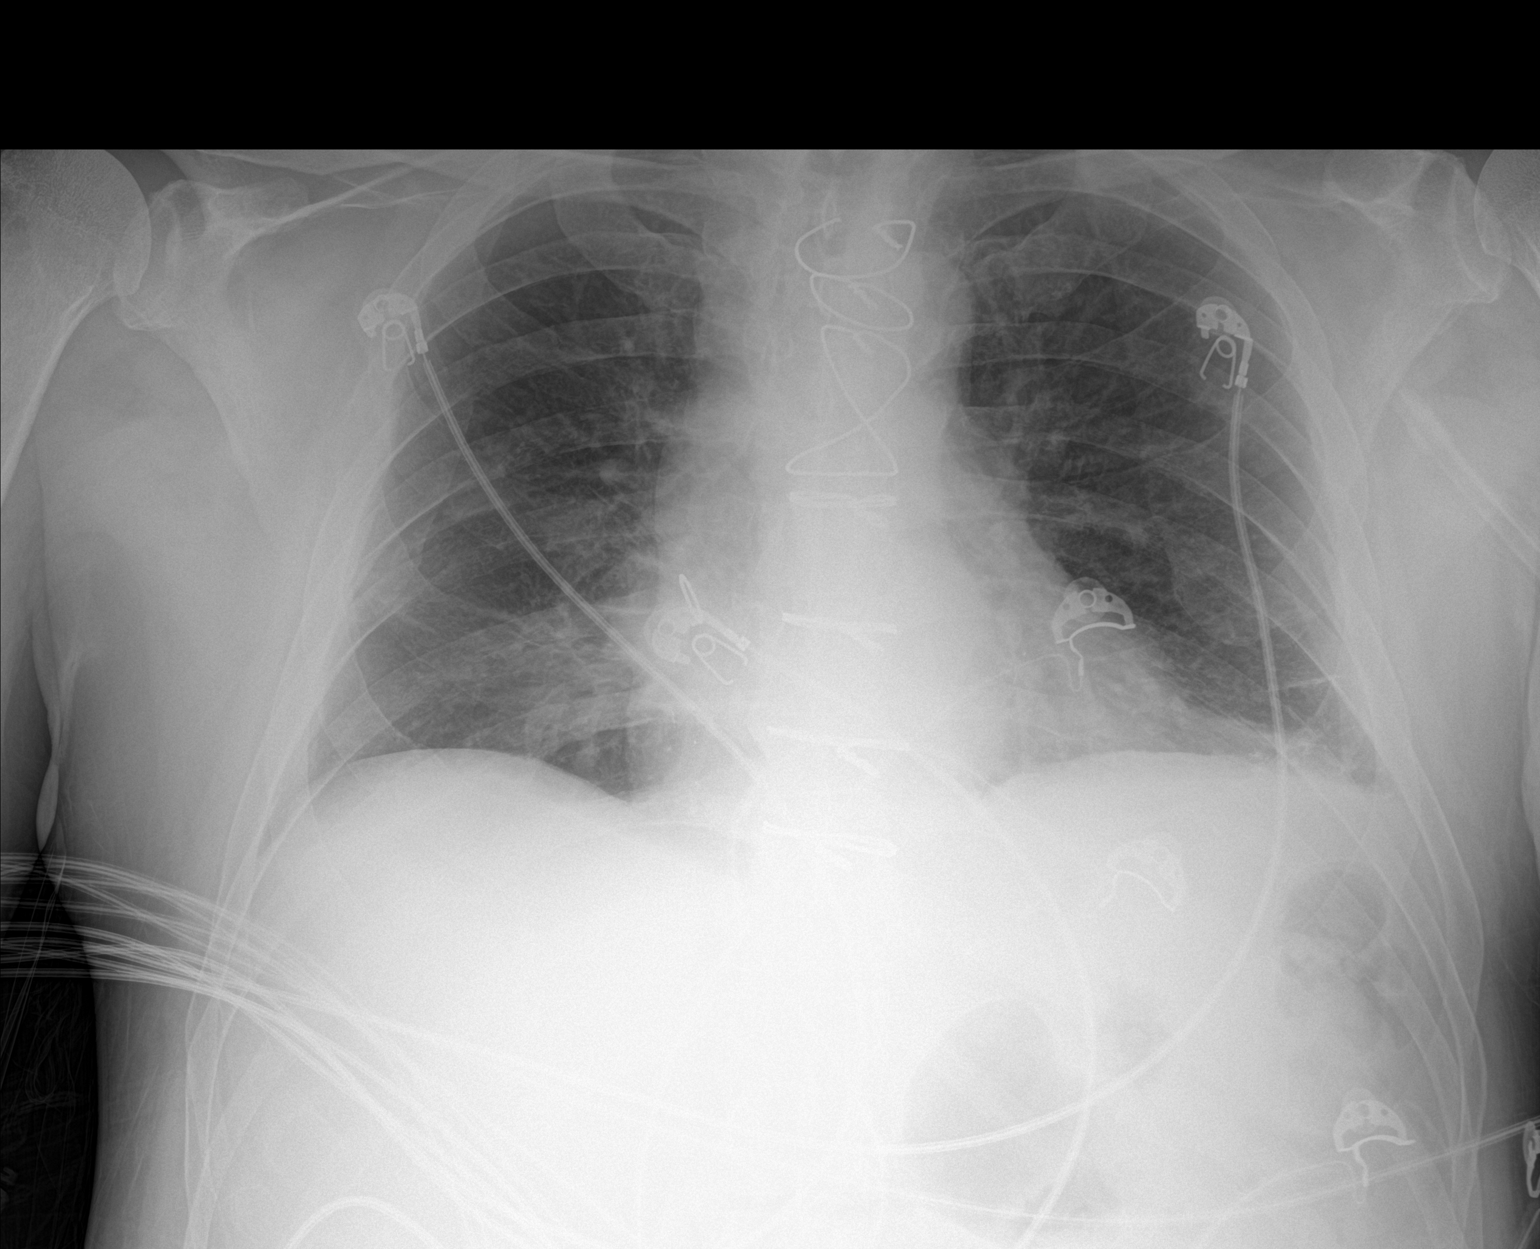

[1 of 1 positions shown; findings below may reference images not displayed]

FINDINGS: The heart size and mediastinal contours are within normal limits.
Sternotomy wires are noted. Minimal left basilar subsegmental
atelectasis is noted. Mild right infrahilar opacity is noted
concerning for atelectasis or infiltrate. The visualized skeletal
structures are unremarkable.
IMPRESSION: Mild right infrahilar opacity is noted concerning for pneumonia or
atelectasis. Followup PA and lateral chest X-ray is recommended in
3-4 weeks following trial of antibiotic therapy to ensure resolution
and exclude underlying malignancy.

Minimal left basilar subsegmental atelectasis is noted.

## 2021-12-10 IMAGING — CT CT ABD-PELV W/ CM
2 of 5 series · 12 of 46 positions shown, 14 images · IV contrast (OMNIPAQUE 350)
Comparison: Comparison is made with [DATE].

CLINICAL DATA: A 70-year-old male presents for evaluation of
nonlocalized abdominal pain and altered mental status. Also found to
have findings of bronchogenic or suspected bronchogenic neoplasm on
previous imaging.



[Series 2: axial st · axial · 0.83mm/px · z∈[-806,-391]mm · 9 of 97 slices shown, 11 images]
[im 7/97  soft-tissue]
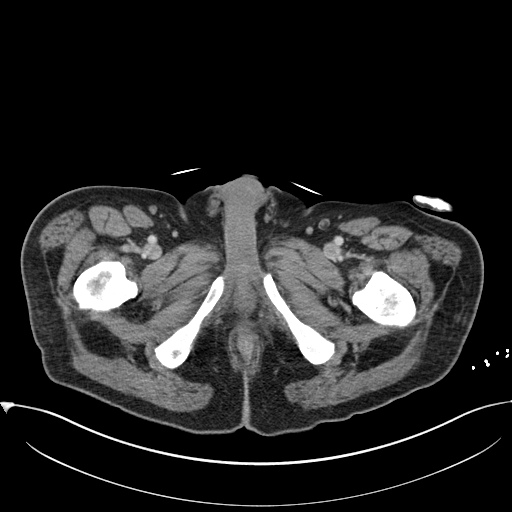
[im 7/97  bone]
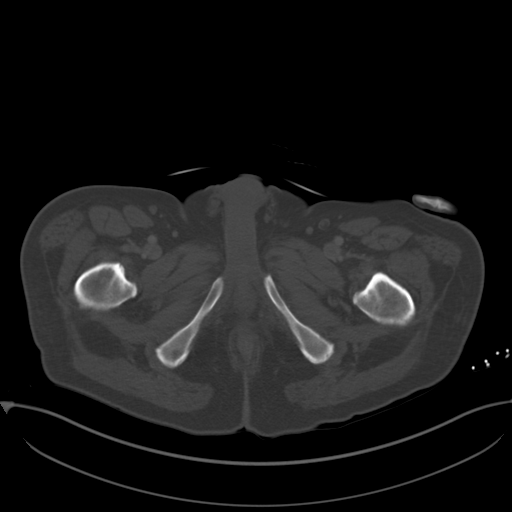
[im 21/97  soft-tissue]
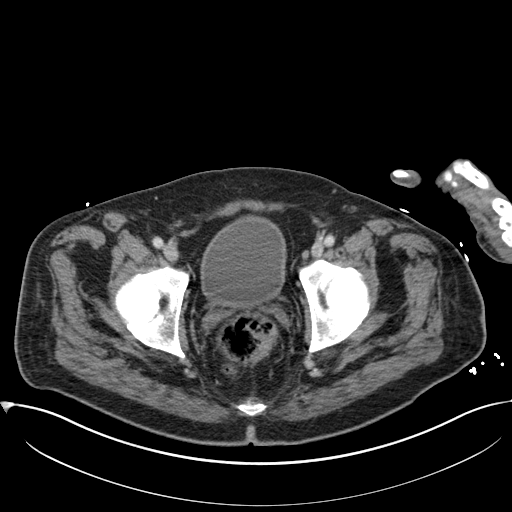
[im 28/97  soft-tissue]
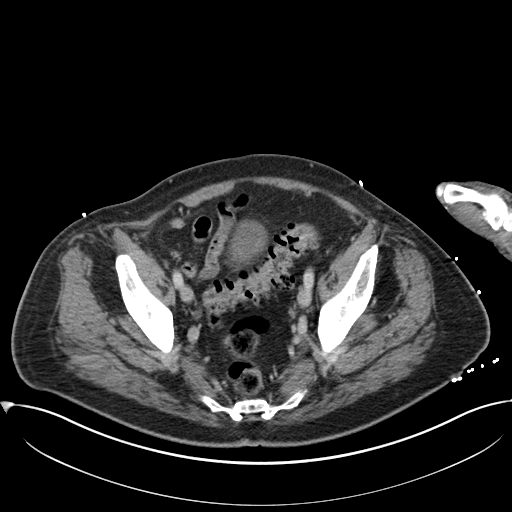
[im 42/97  soft-tissue]
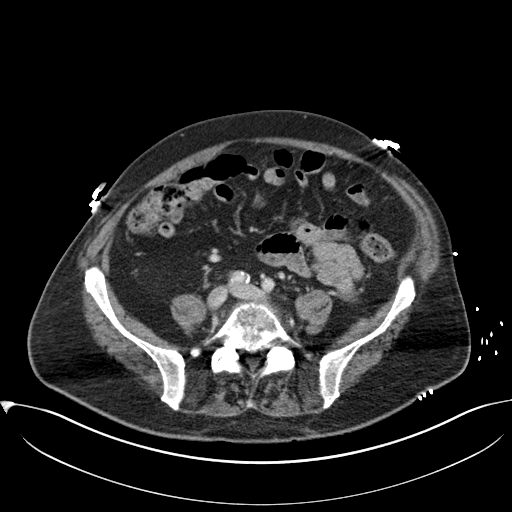
[im 49/97  soft-tissue]
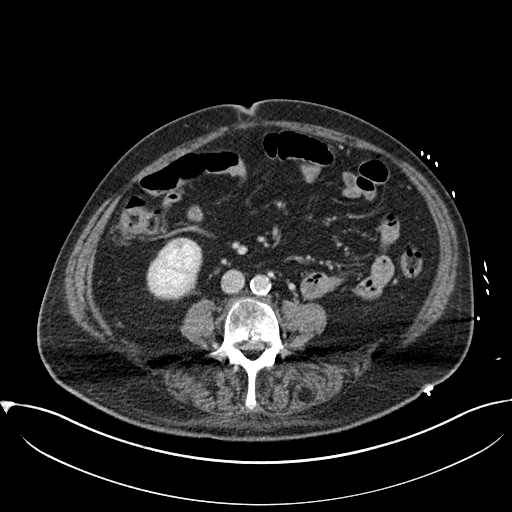
[im 55/97  soft-tissue]
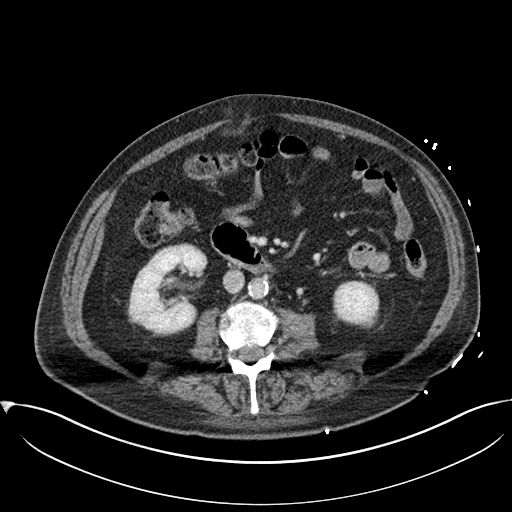
[im 69/97  soft-tissue]
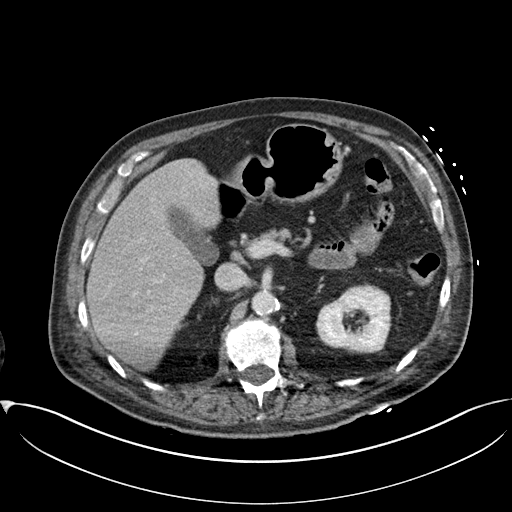
[im 76/97  soft-tissue]
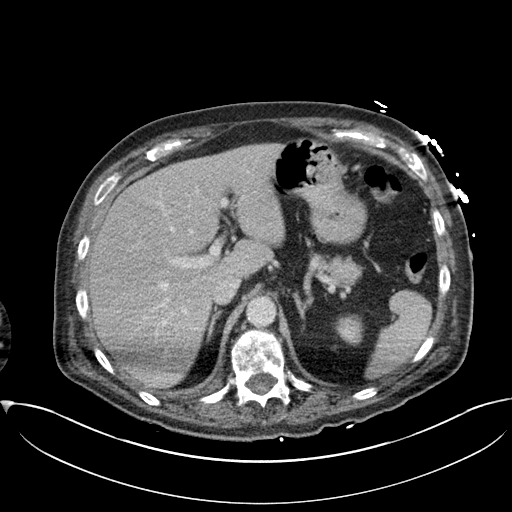
[im 90/97  soft-tissue]
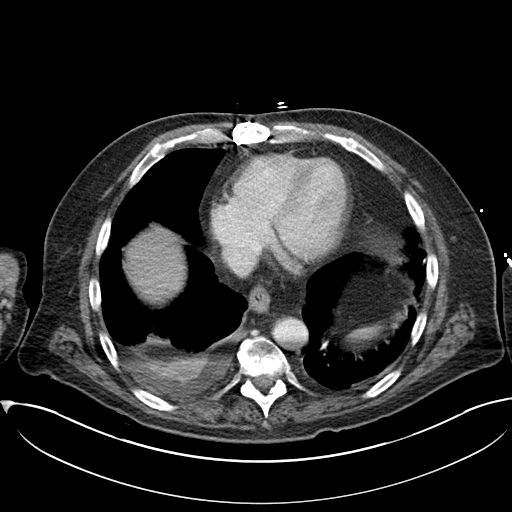
[im 90/97  bone]
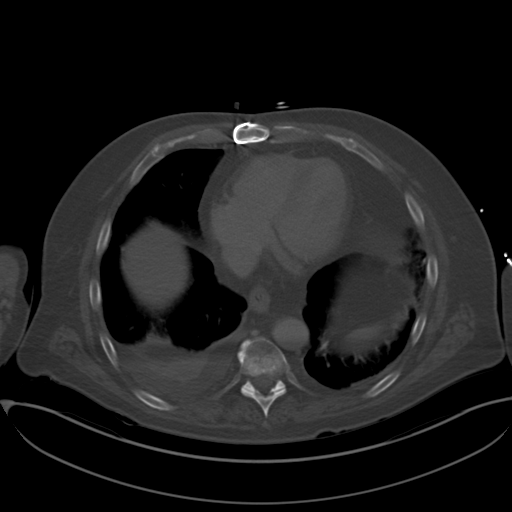

[Series 5: coronal st · coronal · 0.89mm/px · 3 of 91 slices shown]
[im 31/91  soft-tissue]
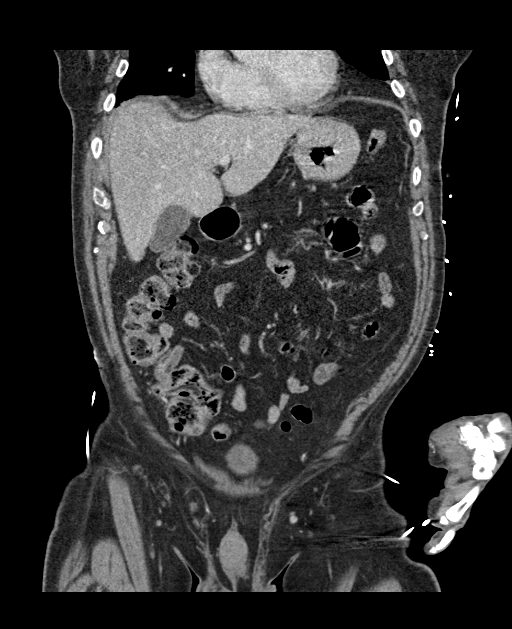
[im 41/91  soft-tissue]
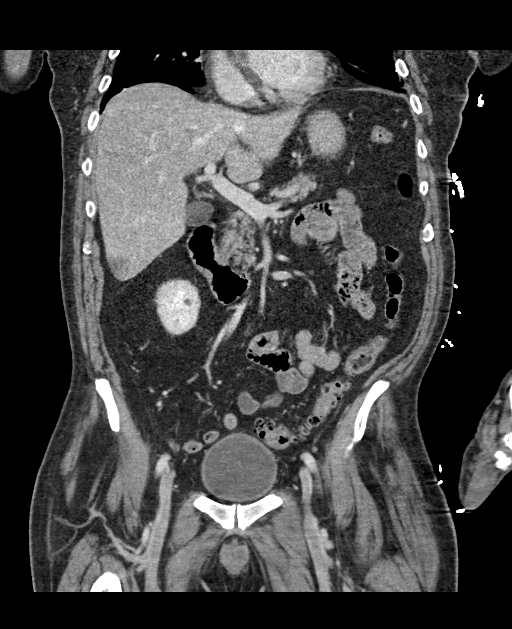
[im 51/91  soft-tissue]
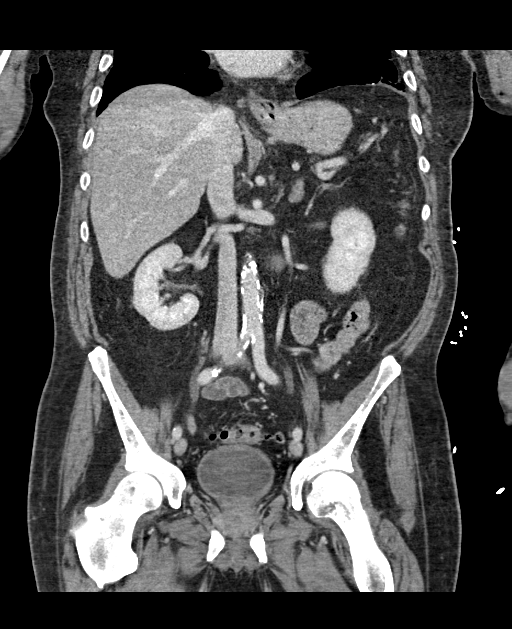

[12 of 46 positions shown; findings below may reference images not displayed]

RADIATION DOSE REDUCTION: This exam was performed according to the
departmental dose-optimization program which includes automated
exposure control, adjustment of the mA and/or kV according to
patient size and/or use of iterative reconstruction technique.

CONTRAST:  100mL OMNIPAQUE IOHEXOL 350 MG/ML SOLN
FINDINGS: CTA CHEST FINDINGS

Cardiovascular: Calcified and noncalcified atheromatous plaque in
the thoracic aorta. Heart size mildly enlarged. No substantial
pericardial effusion. Changes of median sternotomy for CABG.

Central pulmonary vasculature is of normal caliber. Main pulmonary
artery opacified to 258 Hounsfield units. Assessment of pulmonary
vasculature to the proximal or central segmental level without signs
of pulmonary embolism. Some respiratory motion limiting assessment
at the lung bases.

Mediastinum/Nodes: Top-normal RIGHT paratracheal lymph node (image
[DATE]) 13 mm. Scattered small lymph nodes elsewhere in the chest. No
axillary or thoracic inlet adenopathy. RIGHT paratracheal lymph node
is slightly larger than on the previous exam from [DATE].

Lungs/Pleura: Interval development of a RIGHT-sided effusion.
Cavitary masslike area is mast by this pleural effusion on the
previous study appearing less cavitary than on the prior exam. This
mass measures as much as 4.3 cm. Perhaps filled with more fluid on
today's study.

Mild basilar volume loss bilaterally.

Musculoskeletal: Lytic changes about the LEFT scapula which has
developed in the short interval lesion measuring 3.1 cm (image [DATE])

Mottled appearance of the RIGHT scapula which is incompletely imaged
(image [DATE]) no distinct lesion. This finding new since previous
imaging.

Lucent appearance of various vertebral bodies, increasingly lucent
since previous imaging particularly at the T1, T5, T6, T7, T8 and T9
levels. T10 with mottled heterogeneity as well. Interval compression
fracture presumed pathologic of the superior endplate of T6.

This shows mild loss of height approximately 20%.

Further loss of height at T11 slightly greater than or at 50%. Mild
retropulsion of posterior cortical elements is similar to previous
imaging.

Fractures and abnormal bone extending into the LEFT greater than
RIGHT T11 pedicles. This is concerning for unstable fracture.

Review of the MIP images confirms the above findings.

CT ABDOMEN and PELVIS FINDINGS

Hepatobiliary: Hepatic metastatic lesion (image 40/2) 2 cm. No
additional focal suspicious hepatic lesions. Portal vein is patent.
No pericholecystic stranding.

Pancreas: Normal, without mass, inflammation or ductal dilatation.

Spleen: Normal.

Adrenals/Urinary Tract: Adrenal glands are normal.

Hypodense lesion in the interpolar RIGHT kidney is indeterminate
potentially a small solid lesion measuring 12 mm (image 38/2)
similarly an indeterminate subcentimeter lesion arising from the
lower pole measuring 76 Hounsfield units (image 50/2 also in the
RIGHT kidney.

No suspicious renal lesion on the LEFT. Urinary bladder is
unremarkable. Ureter and collecting systems are nondilated. No
perinephric stranding.

Stomach/Bowel: Colonic diverticulosis. No acute gastrointestinal
findings.

Vascular/Lymphatic:

Aortic atherosclerosis. No sign of aneurysm. Smooth contour of the
IVC. There is no gastrohepatic or hepatoduodenal ligament
lymphadenopathy. No retroperitoneal or mesenteric lymphadenopathy.

No pelvic sidewall lymphadenopathy.

Reproductive: Unremarkable.

Other: No pneumoperitoneum.  No signs of ascites.

Musculoskeletal: Lytic lesion in the LEFT femoral head is new over
the short interval (image 83/2) 2.1 cm.

Patchy lytic process in the LEFT iliac crest also new.

Lytic process in the RIGHT femoral head approximately 8 mm (image
80/2) new from previous imaging.

T12 compression fracture with further loss of height now approaching
40% loss of height with mild retropulsion of posterior cortical
elements along the superior aspect of the tubal body.

Metastatic foci more apparent in the posterior aspect of L1 with
early violation of posterior cortex and early extension of soft
tissue into the anterior spinal canal (image 56/6)

New lucent changes in the L2 vertebral body with increasing
conspicuity of L3 lucent changes.

New pathologic endplate compression approximately 10-20% loss of
height at L4 since the prior study.

Vague soft tissue along the posterior margin of L[DATE] represent
early extension of tumor into the central canal (image 53/6)

Further lucency at L5 and new lucency at the S1 level.

Evidence of metastatic disease to RIGHT fourth rib laterally with
bony destruction.

Review of the MIP images confirms the above findings.
IMPRESSION: 1. No evidence of pulmonary embolism.
2. Interval development of a RIGHT-sided effusion.
3. New pleural effusion in the RIGHT chest obscures the tumor which
is more dense than on previous imaging.
4. New basilar airspace disease surrounding this lesion does not
allow for exclusion of concomitant infection.
5. Marked worsening of metastatic disease to the bone as discussed.
Pathologic fractures at multiple levels in the thoracic and lumbar
spine some new and with worsening of fractures at T11 and T12.
Pattern of fractures in T11 is suspicious for unstable injury based
on extension in the bilateral pedicles of abnormal bone and fracture
LEFT greater than RIGHT.
6. Now with signs of solid organ metastasis to the liver.
7. Indeterminate lesions in the RIGHT kidney, potentially a small
solid lesion measuring 12 mm. This may be indicative of metastatic
disease to the RIGHT kidney versus solid renal neoplasm.
8. Colonic diverticulosis without evidence of acute diverticulitis.
9. Aortic atherosclerosis.

Aortic Atherosclerosis ([69]-[69]).

Critical Value/emergent results of potentially unstable T11
pathologic fracture were called by telephone at the time of
interpretation on [DATE] at [DATE] to provider [REDACTED] , who
verbally acknowledged these results.

## 2021-12-10 IMAGING — CT CT ANGIO CHEST
2 of 7 series · 14 of 46 positions shown · IV contrast (OMNIPAQUE 350)
Comparison: Comparison is made with [DATE].

CLINICAL DATA: A 70-year-old male presents for evaluation of
nonlocalized abdominal pain and altered mental status. Also found to
have findings of bronchogenic or suspected bronchogenic neoplasm on
previous imaging.



[Series 5: thins · axial · 0.78mm/px · z∈[-440,-220]mm · 11 of 250 slices shown]
[im 15/250  lung]
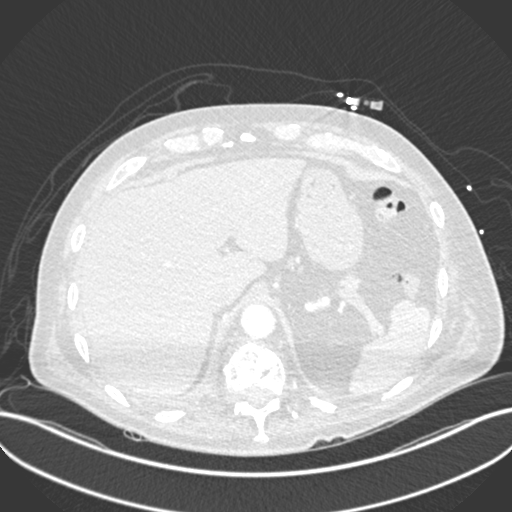
[im 44/250  soft-tissue]
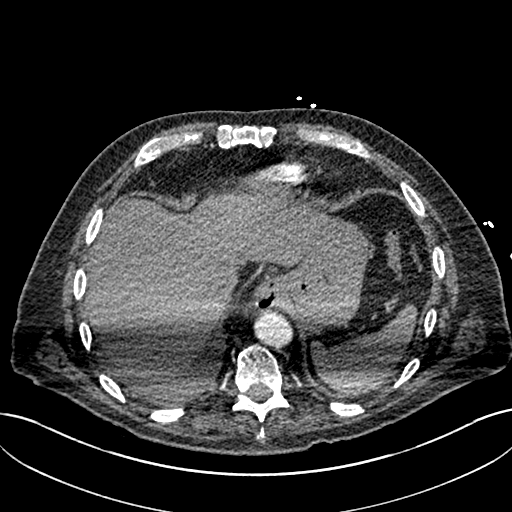
[im 59/250  lung]
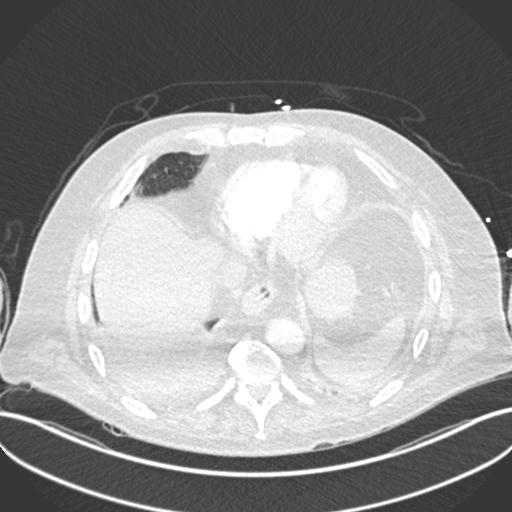
[im 88/250  soft-tissue]
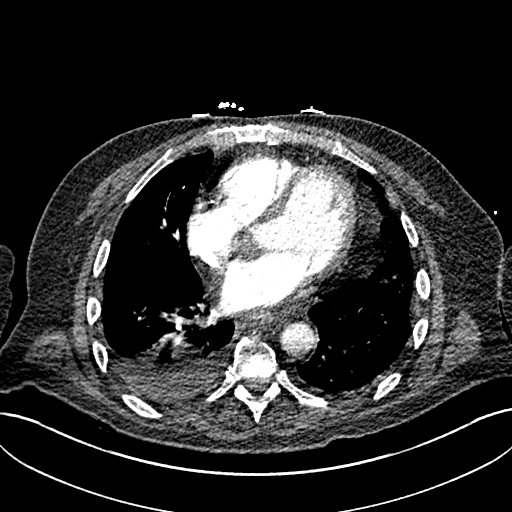
[im 103/250  lung]
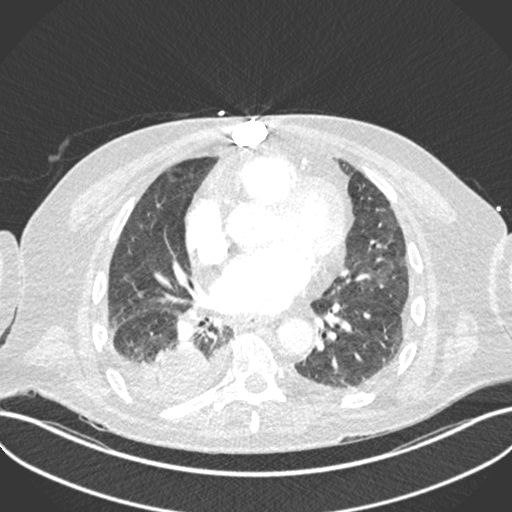
[im 132/250  soft-tissue]
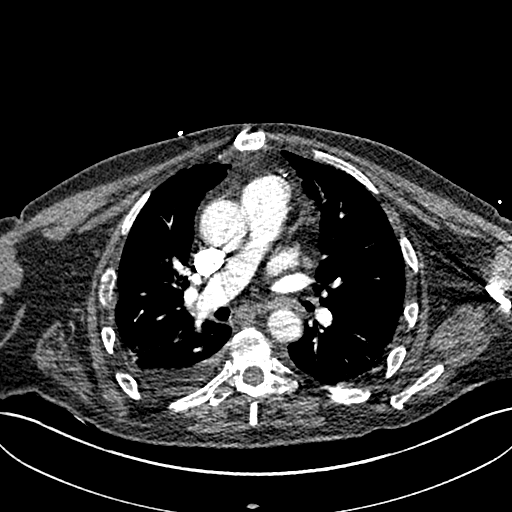
[im 147/250  lung]
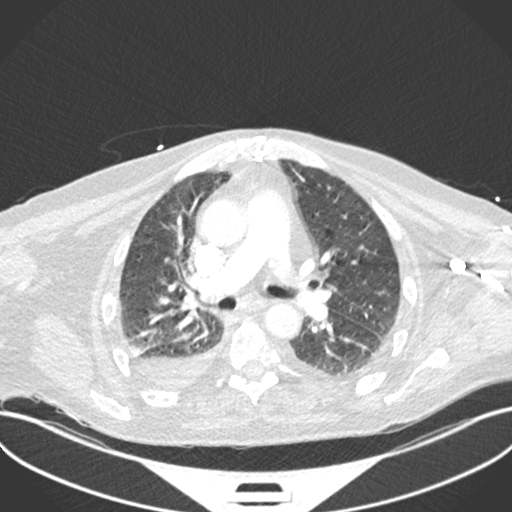
[im 162/250  soft-tissue]
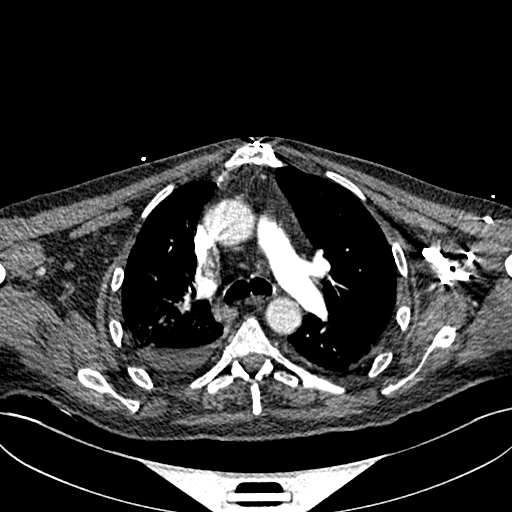
[im 191/250  lung]
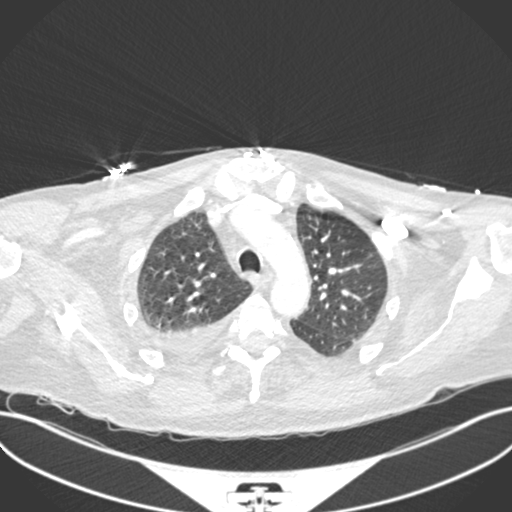
[im 206/250  soft-tissue]
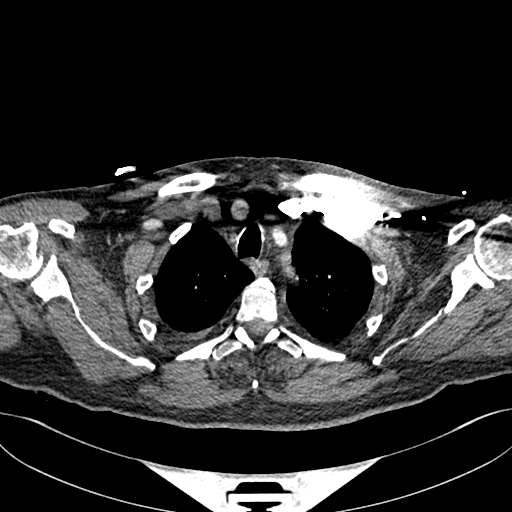
[im 235/250  lung]
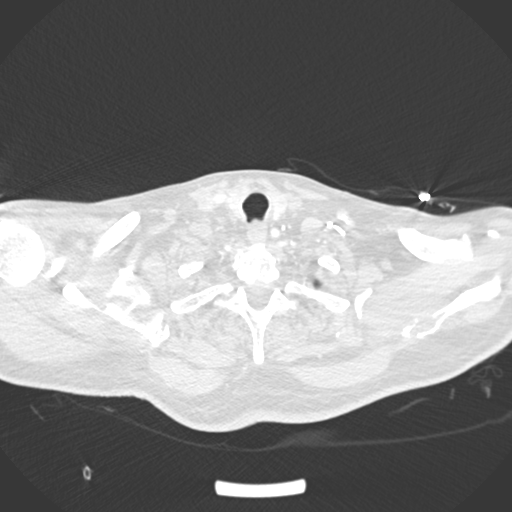

[Series 6: coronal mpr · coronal · 0.60mm/px · 3 of 130 slices shown]
[im 33/130  soft-tissue]
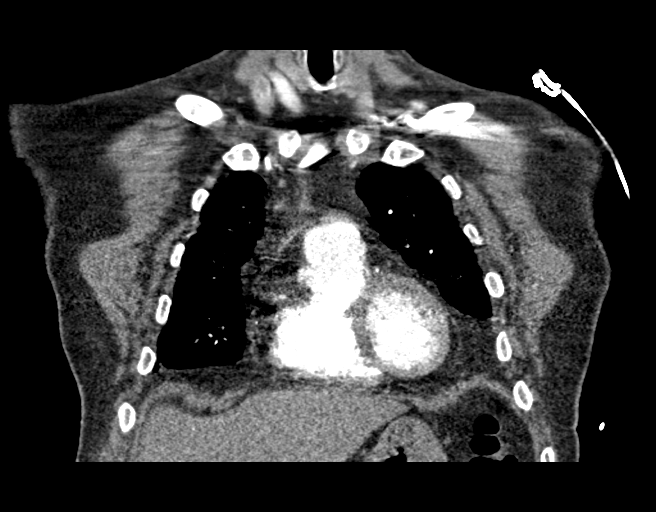
[im 65/130  soft-tissue]
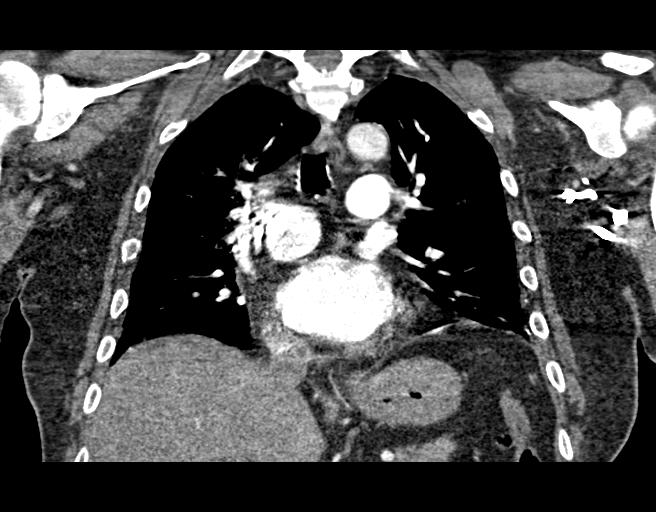
[im 97/130  soft-tissue]
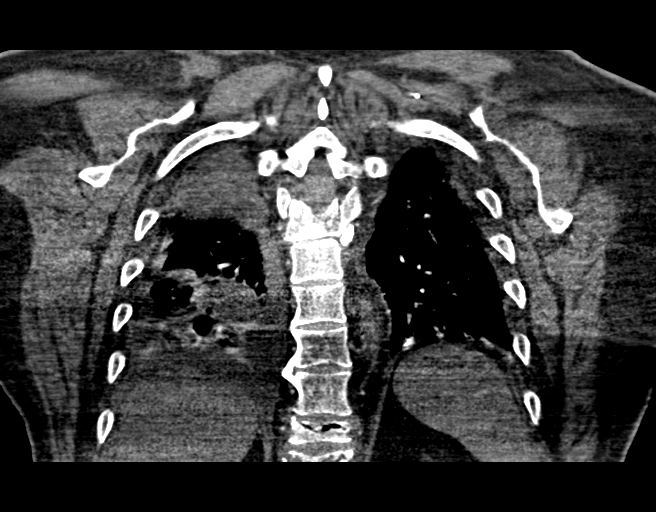

[14 of 46 positions shown; findings below may reference images not displayed]

RADIATION DOSE REDUCTION: This exam was performed according to the
departmental dose-optimization program which includes automated
exposure control, adjustment of the mA and/or kV according to
patient size and/or use of iterative reconstruction technique.

CONTRAST:  100mL OMNIPAQUE IOHEXOL 350 MG/ML SOLN
FINDINGS: CTA CHEST FINDINGS

Cardiovascular: Calcified and noncalcified atheromatous plaque in
the thoracic aorta. Heart size mildly enlarged. No substantial
pericardial effusion. Changes of median sternotomy for CABG.

Central pulmonary vasculature is of normal caliber. Main pulmonary
artery opacified to 258 Hounsfield units. Assessment of pulmonary
vasculature to the proximal or central segmental level without signs
of pulmonary embolism. Some respiratory motion limiting assessment
at the lung bases.

Mediastinum/Nodes: Top-normal RIGHT paratracheal lymph node (image
[DATE]) 13 mm. Scattered small lymph nodes elsewhere in the chest. No
axillary or thoracic inlet adenopathy. RIGHT paratracheal lymph node
is slightly larger than on the previous exam from [DATE].

Lungs/Pleura: Interval development of a RIGHT-sided effusion.
Cavitary masslike area is mast by this pleural effusion on the
previous study appearing less cavitary than on the prior exam. This
mass measures as much as 4.3 cm. Perhaps filled with more fluid on
today's study.

Mild basilar volume loss bilaterally.

Musculoskeletal: Lytic changes about the LEFT scapula which has
developed in the short interval lesion measuring 3.1 cm (image [DATE])

Mottled appearance of the RIGHT scapula which is incompletely imaged
(image [DATE]) no distinct lesion. This finding new since previous
imaging.

Lucent appearance of various vertebral bodies, increasingly lucent
since previous imaging particularly at the T1, T5, T6, T7, T8 and T9
levels. T10 with mottled heterogeneity as well. Interval compression
fracture presumed pathologic of the superior endplate of T6.

This shows mild loss of height approximately 20%.

Further loss of height at T11 slightly greater than or at 50%. Mild
retropulsion of posterior cortical elements is similar to previous
imaging.

Fractures and abnormal bone extending into the LEFT greater than
RIGHT T11 pedicles. This is concerning for unstable fracture.

Review of the MIP images confirms the above findings.

CT ABDOMEN and PELVIS FINDINGS

Hepatobiliary: Hepatic metastatic lesion (image 40/2) 2 cm. No
additional focal suspicious hepatic lesions. Portal vein is patent.
No pericholecystic stranding.

Pancreas: Normal, without mass, inflammation or ductal dilatation.

Spleen: Normal.

Adrenals/Urinary Tract: Adrenal glands are normal.

Hypodense lesion in the interpolar RIGHT kidney is indeterminate
potentially a small solid lesion measuring 12 mm (image 38/2)
similarly an indeterminate subcentimeter lesion arising from the
lower pole measuring 76 Hounsfield units (image 50/2 also in the
RIGHT kidney.

No suspicious renal lesion on the LEFT. Urinary bladder is
unremarkable. Ureter and collecting systems are nondilated. No
perinephric stranding.

Stomach/Bowel: Colonic diverticulosis. No acute gastrointestinal
findings.

Vascular/Lymphatic:

Aortic atherosclerosis. No sign of aneurysm. Smooth contour of the
IVC. There is no gastrohepatic or hepatoduodenal ligament
lymphadenopathy. No retroperitoneal or mesenteric lymphadenopathy.

No pelvic sidewall lymphadenopathy.

Reproductive: Unremarkable.

Other: No pneumoperitoneum.  No signs of ascites.

Musculoskeletal: Lytic lesion in the LEFT femoral head is new over
the short interval (image 83/2) 2.1 cm.

Patchy lytic process in the LEFT iliac crest also new.

Lytic process in the RIGHT femoral head approximately 8 mm (image
80/2) new from previous imaging.

T12 compression fracture with further loss of height now approaching
40% loss of height with mild retropulsion of posterior cortical
elements along the superior aspect of the tubal body.

Metastatic foci more apparent in the posterior aspect of L1 with
early violation of posterior cortex and early extension of soft
tissue into the anterior spinal canal (image 56/6)

New lucent changes in the L2 vertebral body with increasing
conspicuity of L3 lucent changes.

New pathologic endplate compression approximately 10-20% loss of
height at L4 since the prior study.

Vague soft tissue along the posterior margin of L[DATE] represent
early extension of tumor into the central canal (image 53/6)

Further lucency at L5 and new lucency at the S1 level.

Evidence of metastatic disease to RIGHT fourth rib laterally with
bony destruction.

Review of the MIP images confirms the above findings.
IMPRESSION: 1. No evidence of pulmonary embolism.
2. Interval development of a RIGHT-sided effusion.
3. New pleural effusion in the RIGHT chest obscures the tumor which
is more dense than on previous imaging.
4. New basilar airspace disease surrounding this lesion does not
allow for exclusion of concomitant infection.
5. Marked worsening of metastatic disease to the bone as discussed.
Pathologic fractures at multiple levels in the thoracic and lumbar
spine some new and with worsening of fractures at T11 and T12.
Pattern of fractures in T11 is suspicious for unstable injury based
on extension in the bilateral pedicles of abnormal bone and fracture
LEFT greater than RIGHT.
6. Now with signs of solid organ metastasis to the liver.
7. Indeterminate lesions in the RIGHT kidney, potentially a small
solid lesion measuring 12 mm. This may be indicative of metastatic
disease to the RIGHT kidney versus solid renal neoplasm.
8. Colonic diverticulosis without evidence of acute diverticulitis.
9. Aortic atherosclerosis.

Aortic Atherosclerosis ([69]-[69]).

Critical Value/emergent results of potentially unstable T11
pathologic fracture were called by telephone at the time of
interpretation on [DATE] at [DATE] to provider [REDACTED] , who
verbally acknowledged these results.

## 2021-12-10 MED ORDER — FENTANYL CITRATE PF 50 MCG/ML IJ SOSY
25.0000 ug | PREFILLED_SYRINGE | INTRAMUSCULAR | Status: DC | PRN
  Administered 2021-12-10 – 2021-12-11 (×2): 25 ug via INTRAVENOUS
  Filled 2021-12-10 (×2): qty 1

## 2021-12-10 MED ORDER — SODIUM CHLORIDE (PF) 0.9 % IJ SOLN
INTRAMUSCULAR | Status: AC
  Filled 2021-12-10: qty 50

## 2021-12-10 MED ORDER — VANCOMYCIN HCL IN DEXTROSE 1-5 GM/200ML-% IV SOLN: 1000.0000 mg | Freq: Once | INTRAVENOUS | Status: DC

## 2021-12-10 MED ORDER — SODIUM CHLORIDE 0.9 % IV SOLN
2.0000 g | Freq: Three times a day (TID) | INTRAVENOUS | Status: DC
  Administered 2021-12-10 – 2021-12-12 (×7): 2 g via INTRAVENOUS
  Filled 2021-12-10 (×7): qty 2

## 2021-12-10 MED ORDER — POLYETHYLENE GLYCOL 3350 17 G PO PACK
17.0000 g | PACK | Freq: Two times a day (BID) | ORAL | Status: DC
  Administered 2021-12-10 – 2021-12-18 (×6): 17 g via ORAL
  Filled 2021-12-10 (×9): qty 1

## 2021-12-10 MED ORDER — SENNA 8.6 MG PO TABS
2.0000 | ORAL_TABLET | Freq: Every day | ORAL | Status: DC
  Administered 2021-12-10 – 2021-12-14 (×5): 17.2 mg via ORAL
  Filled 2021-12-10 (×6): qty 2

## 2021-12-10 MED ORDER — TAMSULOSIN HCL 0.4 MG PO CAPS
0.4000 mg | ORAL_CAPSULE | Freq: Every day | ORAL | Status: DC
  Administered 2021-12-11 – 2021-12-17 (×7): 0.4 mg via ORAL
  Filled 2021-12-10 (×7): qty 1

## 2021-12-10 MED ORDER — ACETAMINOPHEN 650 MG RE SUPP: 650.0000 mg | Freq: Four times a day (QID) | RECTAL | Status: DC | PRN

## 2021-12-10 MED ORDER — MORPHINE SULFATE ER 15 MG PO TBCR
15.0000 mg | EXTENDED_RELEASE_TABLET | Freq: Once | ORAL | Status: AC
  Administered 2021-12-10: 15 mg via ORAL
  Filled 2021-12-10: qty 1

## 2021-12-10 MED ORDER — POTASSIUM CHLORIDE 10 MEQ/100ML IV SOLN
10.0000 meq | INTRAVENOUS | Status: AC
  Administered 2021-12-10 (×2): 10 meq via INTRAVENOUS
  Filled 2021-12-10 (×2): qty 100

## 2021-12-10 MED ORDER — SODIUM CHLORIDE 0.9 % IV SOLN
2.0000 g | Freq: Once | INTRAVENOUS | Status: AC
  Administered 2021-12-10: 2 g via INTRAVENOUS
  Filled 2021-12-10: qty 2

## 2021-12-10 MED ORDER — ASPIRIN EC 81 MG PO TBEC
81.0000 mg | DELAYED_RELEASE_TABLET | Freq: Every day | ORAL | Status: DC
Start: 2021-12-11 — End: 2021-12-18
  Administered 2021-12-11 – 2021-12-18 (×8): 81 mg via ORAL
  Filled 2021-12-10 (×8): qty 1

## 2021-12-10 MED ORDER — ALBUTEROL SULFATE (2.5 MG/3ML) 0.083% IN NEBU: 2.5000 mg | INHALATION_SOLUTION | Freq: Four times a day (QID) | RESPIRATORY_TRACT | Status: DC | PRN

## 2021-12-10 MED ORDER — FOLIC ACID 1 MG PO TABS
1.0000 mg | ORAL_TABLET | Freq: Every day | ORAL | Status: DC
Start: 2021-12-11 — End: 2021-12-14
  Administered 2021-12-11 – 2021-12-14 (×4): 1 mg via ORAL
  Filled 2021-12-10 (×4): qty 1

## 2021-12-10 MED ORDER — IOHEXOL 350 MG/ML SOLN
100.0000 mL | Freq: Once | INTRAVENOUS | Status: AC | PRN
  Administered 2021-12-10: 100 mL via INTRAVENOUS

## 2021-12-10 MED ORDER — ACETAMINOPHEN 325 MG PO TABS
650.0000 mg | ORAL_TABLET | Freq: Four times a day (QID) | ORAL | Status: DC | PRN
  Administered 2021-12-15 – 2021-12-18 (×2): 650 mg via ORAL
  Filled 2021-12-10 (×2): qty 2

## 2021-12-10 MED ORDER — LOSARTAN POTASSIUM 50 MG PO TABS
100.0000 mg | ORAL_TABLET | Freq: Every day | ORAL | Status: DC
  Administered 2021-12-11 – 2021-12-14 (×4): 100 mg via ORAL
  Filled 2021-12-10 (×4): qty 2

## 2021-12-10 MED ORDER — LACTATED RINGERS IV SOLN: INTRAVENOUS | Status: DC

## 2021-12-10 MED ORDER — OXYCODONE-ACETAMINOPHEN 7.5-325 MG PO TABS: 1.0000 | ORAL_TABLET | Freq: Four times a day (QID) | ORAL | Status: DC | PRN

## 2021-12-10 MED ORDER — PANTOPRAZOLE SODIUM 40 MG PO TBEC
40.0000 mg | DELAYED_RELEASE_TABLET | Freq: Every day | ORAL | Status: DC
  Administered 2021-12-11 – 2021-12-18 (×8): 40 mg via ORAL
  Filled 2021-12-10 (×8): qty 1

## 2021-12-10 MED ORDER — VANCOMYCIN HCL 1500 MG/300ML IV SOLN
1500.0000 mg | Freq: Once | INTRAVENOUS | Status: AC
  Administered 2021-12-10: 1500 mg via INTRAVENOUS
  Filled 2021-12-10: qty 300

## 2021-12-10 MED ORDER — METRONIDAZOLE 500 MG/100ML IV SOLN
500.0000 mg | Freq: Once | INTRAVENOUS | Status: AC
  Administered 2021-12-10: 500 mg via INTRAVENOUS
  Filled 2021-12-10: qty 100

## 2021-12-10 MED ORDER — CARVEDILOL 6.25 MG PO TABS
6.2500 mg | ORAL_TABLET | Freq: Two times a day (BID) | ORAL | Status: DC
  Administered 2021-12-11 – 2021-12-18 (×14): 6.25 mg via ORAL
  Filled 2021-12-10 (×14): qty 1

## 2021-12-10 MED ORDER — MORPHINE SULFATE ER 15 MG PO TBCR
15.0000 mg | EXTENDED_RELEASE_TABLET | Freq: Two times a day (BID) | ORAL | Status: DC
  Administered 2021-12-10 – 2021-12-18 (×16): 15 mg via ORAL
  Filled 2021-12-10 (×16): qty 1

## 2021-12-10 MED ORDER — SODIUM CHLORIDE 0.9 % IV SOLN: INTRAVENOUS | Status: DC

## 2021-12-10 MED ORDER — ATORVASTATIN CALCIUM 10 MG PO TABS
20.0000 mg | ORAL_TABLET | Freq: Every day | ORAL | Status: DC
  Administered 2021-12-11 – 2021-12-14 (×4): 20 mg via ORAL
  Filled 2021-12-10 (×4): qty 2

## 2021-12-10 MED ORDER — IPRATROPIUM-ALBUTEROL 0.5-2.5 (3) MG/3ML IN SOLN
3.0000 mL | Freq: Four times a day (QID) | RESPIRATORY_TRACT | Status: DC
  Administered 2021-12-10: 3 mL via RESPIRATORY_TRACT
  Filled 2021-12-10: qty 3

## 2021-12-10 MED ORDER — VANCOMYCIN HCL IN DEXTROSE 1-5 GM/200ML-% IV SOLN
1000.0000 mg | Freq: Two times a day (BID) | INTRAVENOUS | Status: DC
  Administered 2021-12-10 – 2021-12-11 (×2): 1000 mg via INTRAVENOUS
  Filled 2021-12-10 (×2): qty 200

## 2021-12-10 MED ORDER — POTASSIUM CHLORIDE CRYS ER 20 MEQ PO TBCR
40.0000 meq | EXTENDED_RELEASE_TABLET | Freq: Every day | ORAL | Status: DC
  Administered 2021-12-11 – 2021-12-15 (×5): 40 meq via ORAL
  Filled 2021-12-10 (×5): qty 2

## 2021-12-10 MED ORDER — HYDROMORPHONE HCL 2 MG PO TABS
2.0000 mg | ORAL_TABLET | Freq: Four times a day (QID) | ORAL | Status: DC | PRN
  Administered 2021-12-11 – 2021-12-17 (×5): 2 mg via ORAL
  Filled 2021-12-10 (×5): qty 1

## 2021-12-10 MED ORDER — PERMETHRIN 1 % EX LOTN
TOPICAL_LOTION | Freq: Once | CUTANEOUS | Status: DC
  Filled 2021-12-10: qty 59

## 2021-12-10 MED ORDER — AMLODIPINE BESYLATE 10 MG PO TABS
10.0000 mg | ORAL_TABLET | Freq: Every day | ORAL | Status: DC
  Administered 2021-12-11 – 2021-12-18 (×8): 10 mg via ORAL
  Filled 2021-12-10 (×8): qty 1

## 2021-12-10 MED ORDER — MORPHINE SULFATE ER 15 MG PO TBCR: 15.0000 mg | EXTENDED_RELEASE_TABLET | Freq: Two times a day (BID) | ORAL | Status: DC

## 2021-12-10 MED ORDER — IPRATROPIUM-ALBUTEROL 0.5-2.5 (3) MG/3ML IN SOLN
3.0000 mL | Freq: Three times a day (TID) | RESPIRATORY_TRACT | Status: DC
  Administered 2021-12-11: 3 mL via RESPIRATORY_TRACT
  Filled 2021-12-10: qty 3

## 2021-12-10 MED ORDER — KETOROLAC TROMETHAMINE 15 MG/ML IJ SOLN
15.0000 mg | Freq: Once | INTRAMUSCULAR | Status: AC
  Administered 2021-12-10: 15 mg via INTRAVENOUS
  Filled 2021-12-10: qty 1

## 2021-12-10 MED ORDER — GABAPENTIN 300 MG PO CAPS
300.0000 mg | ORAL_CAPSULE | Freq: Three times a day (TID) | ORAL | Status: DC
  Administered 2021-12-10 – 2021-12-18 (×23): 300 mg via ORAL
  Filled 2021-12-10 (×23): qty 1

## 2021-12-10 MED ORDER — KETOROLAC TROMETHAMINE 15 MG/ML IJ SOLN: 15.0000 mg | Freq: Three times a day (TID) | INTRAMUSCULAR | Status: DC | PRN

## 2021-12-10 NOTE — ED Triage Notes (Signed)
Pt BIB by ems for painful urination x one month; pt has ams  BP 157/90 P 116 O2 96% on 2L  R 24  Pt given 1072ml of NS by EMS  Pt denies any pain and is only alert to name and dob

## 2021-12-10 NOTE — Progress Notes (Addendum)
HEMATOLOGY-ONCOLOGY PROGRESS NOTE  ASSESSMENT AND PLAN: 1.  Metastatic lung adenocarcinoma 2.  Acute respiratory failure with hypoxia 3.  Pathologic fracture of the thoracic vertebra 4.  Mild anemia 5.  Leukocytosis 6.  Hyponatremia 7.  Hypokalemia  -Reviewed CT imaging findings with the patient and his wife in detail.  Findings suggestive of disease progression.  He will be seen by Carlos Erickson to discuss systemic treatment options.  However, I had initial discussions with the patient and his wife that given his overall poor performance status, he may not be a candidate for treatment at that we may need to consider comfort measures/hospice. -Pathologic fracture noted on CT.  ER physician has called neurosurgery for evaluation. -Continue current pain medication. -CT chest does show some new right-sided pleural effusion, but seems to be small.  Can consider thoracentesis if respiratory status worsens.  May have subtle pneumonia and recommend antibiotics. -Management of other medical conditions per primary team.  Carlos Bussing, DNP, AGPCNP-BC, AOCNP   SUBJECTIVE: Carlos Erickson is followed by our office for metastatic lung adenocarcinoma.  He recently completed a course of palliative radiation to T11-T12 and was under consideration of systemic treatment.  The patient was brought to the emergency department due to altered mental status.  The patient's wife reported that he was coughing and seemed confused.  He was hypoxic in the emergency department with sats down in the 70s.  The patient was seen in the emergency department this morning.  His wife is at the bedside.  He reports ongoing back pain.  He reports shortness of breath this morning but improved with oxygen.  He tells me that he is spending most of his time in bed these days.  He reports headaches.  No other complaints reported this morning.  Oncology History  Bone metastasis ([place])   Initial Diagnosis   Bone metastasis ([place])   10/31/2021  Imaging   EXAM: CT LUMBAR SPINE WITH CONTRAST  IMPRESSION: 1. 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 Moderate pathologic fracture of T11, with suspected epidural and extraosseous extension of tumor there. Consider encroachment on the lower thoracic spinal cord, MRI would be confirmatory. Mild pathologic fracture of T12. L4 and L5 vertebral metastases also suspected.   2. See also CTA abdomen and Pelvis today reported separately.   3. Lumbar spine degeneration with only mild degenerative spinal stenosis.   10/31/2021 Imaging   EXAM: CTA ABDOMEN AND PELVIS WITHOUT AND WITH CONTRAST  IMPRESSION: VASCULAR   1. Negative for abdominal aortic aneurysm, dissection or other acute vascular abnormality. 2. Aortic Atherosclerosis (ICD10-I70.0).   NON-VASCULAR   1. Unfortunately, CT findings are highly concerning for a centrally necrotic 7.4 cm primary bronchogenic carcinoma in the posterior right lower lobe with multifocal osseous metastatic disease including likely pathologic fractures at T11 and T12. Given central necrosis, the primary differential consideration is squamous cell carcinoma. Recommend referral to multi disciplinary thoracic tumor board for further evaluation and tissue diagnosis. 2. Pathologic T11 compression fracture with approximately 40% height loss. 3. Likely subtle pathologic fracture involving the superior endplate of Y69 without height loss. 4. Multifocal osseous metastatic disease including at least T9, T11, T12, L4 and L5. 5. No solid organ metastasis identified within the abdomen or pelvis. 6. 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   10/31/2021 Imaging   EXAM: CT CHEST WITH CONTRAST  IMPRESSION: 1. There is a thick-walled subpleural cavitary lesion of the dependent right lower lobe measuring 6.9 cm, highly concerning for  primary lung malignancy. Cavitary infection is however a general differential  consideration. 2. Wedge deformity of T11 and superior endplate deformity of Y78 with subtle underlying lytic character, highly concerning for osseous metastatic disease and pathologic fracture. 3. Additional small lucent focus at the left aspect of T9, suspicious for an additional metastatic lesion. 4. No evidence of lymphadenopathy in the chest. 5. Emphysema. 6. Coronary artery disease.   Aortic Atherosclerosis (ICD10-I70.0) and Emphysema (ICD10-J43.9).   11/09/2021 Initial Biopsy   FINAL MICROSCOPIC DIAGNOSIS:  A. LUNG, RLL, FINE NEEDLE ASPIRATION AND BIOPSIES:  - Adenocarcinoma.  See comment.  B. LUNG, RLL, BRUSH:  - Adenocarcinoma.   FINAL MICROSCOPIC DIAGNOSIS:  C. LYMPH NODE, 7, FINE NEEDLE ASPIRATION:  - Adenocarcinoma.  COMMENT:  The cells stain positively for cytokeratin 7 and cytokeratin 20, and  have patchy positivity for CDX2.  TTF-1 and p40 immunostains are negative.  The differential diagnosis includes primary pulmonary adenocarcinoma as well as a GI primary.   11/11/2021 Imaging   EXAM: MRI THORACIC WITHOUT AND WITH CONTRAST  IMPRESSION: Numerous levels of vertebral metastatic disease (listed above) most notable at T11 and T12 where there is confluent body involvement and pathologic fracture. Body fracture at T11 causes 60% height loss and retropulsion with ventral epidural tumor contacting but not compressing the cord.   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 Negative for chills and fever.  HENT: Negative.    Respiratory:  Positive for cough and shortness of breath.   Cardiovascular: Negative.   Gastrointestinal: Negative.   Musculoskeletal:  Positive for back pain.  Skin: Negative.   Neurological:  Positive for headaches.  Psychiatric/Behavioral: Negative.     I have reviewed the past medical history, past surgical history, social history and family history with the patient and they are unchanged from previous note.   PHYSICAL EXAMINATION: ECOG PERFORMANCE STATUS: 4 - Bedbound  Vitals:   12/10/21 1115 12/10/21 1145  BP: 126/83 138/89  Pulse: (!) 111 (!) 110  Resp: 15 18  Temp:    SpO2: 97% 98%   Filed Weights   12/10/21 0427  Weight: 76.7 kg    Intake/Output from previous day: 02/16 0701 - 02/17 0700 In: 1100 [I.V.:1000; IV Piggyback:100] Out: -   Physical Exam Vitals reviewed.  Constitutional:      Appearance: He is ill-appearing.  HENT:     Head: Normocephalic.  Eyes:     General: No scleral icterus.    Conjunctiva/sclera: Conjunctivae normal.  Cardiovascular:     Rate and Rhythm: Tachycardia present.  Pulmonary:     Effort: Pulmonary effort is normal. No respiratory distress.  Abdominal:     General: There is no distension.     Palpations: Abdomen is soft.     Tenderness: There is no abdominal tenderness.  Skin:  General: Skin is warm and dry.  Neurological:     Mental Status: He is alert and oriented to person, place, and time.    LABORATORY DATA:  I have reviewed the data as listed CMP Latest Ref Rng & Units 12/10/2021 12/03/2021 11/09/2021  Glucose 70 - 99 mg/dL 122(H) 126(H) 109(H)  BUN 8 - 23 mg/dL 19 29(H) 14  Creatinine 0.61 - 1.24 mg/dL 0.59(L) 0.78 0.90  Sodium 135 - 145 mmol/L 127(L) 132(L) 133(L)  Potassium 3.5 - 5.1 mmol/L 3.3(L) 3.9  4.9  Chloride 98 - 111 mmol/L 96(L) 96(L) 97(L)  CO2 22 - 32 mmol/L 22 26 25   Calcium 8.9 - 10.3 mg/dL 8.6(L) 9.9 9.1  Total Protein 6.5 - 8.1 g/dL 6.5 6.8 -  Total Bilirubin 0.3 - 1.2 mg/dL 1.0 0.7 -  Alkaline Phos 38 - 126 U/L 141(H) 133(H) -  AST 15 - 41 U/L 23 14(L) -  ALT 0 - 44 U/L 25 19 -    Lab Results  Component Value Date   WBC 13.5 (H) 12/10/2021   HGB 11.2 (L) 12/10/2021   HCT 32.7 (L) 12/10/2021   MCV 86.7 12/10/2021   PLT 177 12/10/2021   NEUTROABS 9.4 (H) 12/03/2021    Lab Results  Component Value Date   PSA1 0.7 01/18/2018    CT Head Wo Contrast  Result Date: 12/10/2021 CLINICAL DATA:  Altered mental status, recent diagnosis of non-small cell lung cancer EXAM: CT HEAD WITHOUT CONTRAST TECHNIQUE: Contiguous axial images were obtained from the base of the skull through the vertex without intravenous contrast. RADIATION DOSE REDUCTION: This exam was performed according to the departmental dose-optimization program which includes automated exposure control, adjustment of the mA and/or kV according to patient size and/or use of iterative reconstruction technique. COMPARISON:  Brain MRI 11/11/2021 FINDINGS: Brain: There is no evidence of acute intracranial hemorrhage, extra-axial fluid collection, or acute infarct. Parenchymal volume is normal. The ventricles are normal in size. Gray-white differentiation is preserved. There is no significant burden of white matter microangiopathic change. There is no mass lesion.  There is no midline shift. Vascular: There is calcification of the bilateral cavernous ICAs. Skull: Normal. Negative for fracture or focal lesion. Sinuses/Orbits: The paranasal sinuses are clear. A right lens implant is noted. The globes and orbits are otherwise unremarkable. Other: None. IMPRESSION: No acute intracranial pathology. Electronically Signed   By: Valetta Mole M.D.   On: 12/10/2021 09:27   CT Angio Chest PE W and/or Wo Contrast  Result Date:  12/10/2021 CLINICAL DATA:  A 71 year old male presents for evaluation of nonlocalized abdominal pain and altered mental status. Also found to have findings of bronchogenic or suspected bronchogenic neoplasm on previous imaging. EXAM: CT ANGIOGRAPHY CHEST CT ABDOMEN AND PELVIS WITH CONTRAST TECHNIQUE: Multidetector CT imaging of the chest was performed using the standard protocol during bolus administration of intravenous contrast. Multiplanar CT image reconstructions and MIPs were obtained to evaluate the vascular anatomy. Multidetector CT imaging of the abdomen and pelvis was performed using the standard protocol during bolus administration of intravenous contrast. RADIATION DOSE REDUCTION: This exam was performed according to the departmental dose-optimization program which includes automated exposure control, adjustment of the mA and/or kV according to patient size and/or use of iterative reconstruction technique. CONTRAST:  138mL OMNIPAQUE IOHEXOL 350 MG/ML SOLN COMPARISON:  Comparison is made with October 31, 2021. FINDINGS: CTA CHEST FINDINGS Cardiovascular: Calcified and noncalcified atheromatous plaque in the thoracic aorta. Heart size mildly enlarged. No substantial pericardial effusion. Changes  of median sternotomy for CABG. Central pulmonary vasculature is of normal caliber. Main pulmonary artery opacified to 258 Hounsfield units. Assessment of pulmonary vasculature to the proximal or central segmental level without signs of pulmonary embolism. Some respiratory motion limiting assessment at the lung bases. Mediastinum/Nodes: Top-normal RIGHT paratracheal lymph node (image 28/4) 13 mm. Scattered small lymph nodes elsewhere in the chest. No axillary or thoracic inlet adenopathy. RIGHT paratracheal lymph node is slightly larger than on the previous exam from October 31, 2021. Lungs/Pleura: Interval development of a RIGHT-sided effusion. Cavitary masslike area is mast by this pleural effusion on the previous  study appearing less cavitary than on the prior exam. This mass measures as much as 4.3 cm. Perhaps filled with more fluid on today's study. Mild basilar volume loss bilaterally. Musculoskeletal: Lytic changes about the LEFT scapula which has developed in the short interval lesion measuring 3.1 cm (image 8/10) Mottled appearance of the RIGHT scapula which is incompletely imaged (image 3/10) no distinct lesion. This finding new since previous imaging. Lucent appearance of various vertebral bodies, increasingly lucent since previous imaging particularly at the T1, T5, T6, T7, T8 and T9 levels. T10 with mottled heterogeneity as well. Interval compression fracture presumed pathologic of the superior endplate of T6. This shows mild loss of height approximately 20%. Further loss of height at T11 slightly greater than or at 50%. Mild retropulsion of posterior cortical elements is similar to previous imaging. Fractures and abnormal bone extending into the LEFT greater than RIGHT T11 pedicles. This is concerning for unstable fracture. Review of the MIP images confirms the above findings. CT ABDOMEN and PELVIS FINDINGS Hepatobiliary: Hepatic metastatic lesion (image 40/2) 2 cm. No additional focal suspicious hepatic lesions. Portal vein is patent. No pericholecystic stranding. Pancreas: Normal, without mass, inflammation or ductal dilatation. Spleen: Normal. Adrenals/Urinary Tract: Adrenal glands are normal. Hypodense lesion in the interpolar RIGHT kidney is indeterminate potentially a small solid lesion measuring 12 mm (image 38/2) similarly an indeterminate subcentimeter lesion arising from the lower pole measuring 76 Hounsfield units (image 50/2 also in the RIGHT kidney. No suspicious renal lesion on the LEFT. Urinary bladder is unremarkable. Ureter and collecting systems are nondilated. No perinephric stranding. Stomach/Bowel: Colonic diverticulosis. No acute gastrointestinal findings. Vascular/Lymphatic: Aortic  atherosclerosis. No sign of aneurysm. Smooth contour of the IVC. There is no gastrohepatic or hepatoduodenal ligament lymphadenopathy. No retroperitoneal or mesenteric lymphadenopathy. No pelvic sidewall lymphadenopathy. Reproductive: Unremarkable. Other: No pneumoperitoneum.  No signs of ascites. Musculoskeletal: Lytic lesion in the LEFT femoral head is new over the short interval (image 83/2) 2.1 cm. Patchy lytic process in the LEFT iliac crest also new. Lytic process in the RIGHT femoral head approximately 8 mm (image 80/2) new from previous imaging. T12 compression fracture with further loss of height now approaching 40% loss of height with mild retropulsion of posterior cortical elements along the superior aspect of the tubal body. Metastatic foci more apparent in the posterior aspect of L1 with early violation of posterior cortex and early extension of soft tissue into the anterior spinal canal (image 56/6) New lucent changes in the L2 vertebral body with increasing conspicuity of L3 lucent changes. New pathologic endplate compression approximately 10-20% loss of height at L4 since the prior study. Vague soft tissue along the posterior margin of L4 may represent early extension of tumor into the central canal (image 53/6) Further lucency at L5 and new lucency at the S1 level. Evidence of metastatic disease to RIGHT fourth rib laterally with bony destruction. Review  of the MIP images confirms the above findings. IMPRESSION: 1. No evidence of pulmonary embolism. 2. Interval development of a RIGHT-sided effusion. 3. New pleural effusion in the RIGHT chest obscures the tumor which is more dense than on previous imaging. 4. New basilar airspace disease surrounding this lesion does not allow for exclusion of concomitant infection. 5. 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 Pathologic fractures at multiple levels in the thoracic and lumbar spine some new and with worsening of fractures at T11  and T12. Pattern of fractures in T11 is suspicious for unstable injury based on extension in the bilateral pedicles of abnormal bone and fracture LEFT greater than RIGHT. 6. 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 7. Indeterminate lesions in the RIGHT kidney, potentially a small solid lesion measuring 12 mm. This may be indicative of metastatic disease to the RIGHT kidney versus solid renal neoplasm. 8. Colonic diverticulosis without evidence of acute diverticulitis. 9. Aortic atherosclerosis. Aortic Atherosclerosis (ICD10-I70.0). Critical Value/emergent results of potentially unstable T11 pathologic fracture were called by telephone at the time of interpretation on 12/10/2021 at 9:50 am to provider PA Rona Ravens , who verbally acknowledged these results. Electronically Signed   By: Zetta Bills M.D.   On: 12/10/2021 09:52   MR Brain W Wo Contrast  Result Date: 11/12/2021 CLINICAL DATA:  Non-small cell lung cancer staging EXAM: MRI HEAD WITHOUT AND WITH CONTRAST TECHNIQUE: Multiplanar, multiecho pulse sequences of the brain and surrounding structures were obtained without and with intravenous contrast. CONTRAST:  57mL GADAVIST GADOBUTROL 1 MMOL/ML IV SOLN COMPARISON:  05/30/2020 FINDINGS: Brain: No swelling, infarction, hemorrhage, hydrocephalus, extra-axial collection or mass lesion. No abnormal enhancement to suggest metastatic disease. Mineralization at the left brachium pontis which is stable from August 2021. Brain volume is normal Vascular: Normal flow voids and vascular enhancements Skull and upper cervical spine: Normal marrow signal Sinuses/Orbits: Right cataract resection. IMPRESSION: Negative for metastatic disease. Electronically Signed   By: Jorje Guild M.D.   On: 11/12/2021 18:17   MR THORACIC SPINE W WO CONTRAST  Result Date: 11/12/2021 CLINICAL DATA:  Non-small cell lung cancer staging. Severe upper back pain for 2 months EXAM: MRI THORACIC WITHOUT AND WITH CONTRAST TECHNIQUE:  Multiplanar and multiecho pulse sequences of the thoracic spine were obtained without and with intravenous contrast. CONTRAST:  92mL GADAVIST GADOBUTROL 1 MMOL/ML IV SOLN COMPARISON:  Chest and lumbar CT from 10/31/2021 FINDINGS: Alignment:  Negative for listhesis Vertebrae: Numerous infiltrative bone lesions including: *T2 spinous process. *T5 right superior body *T6 body. This metastasis is diffuse without compression fracture or extraosseous extension. Smaller midline metastasis in the spinous process at this level. *T7 right articular process *T8 right body *Multifocal in the T9 body and right posterior element *Multifocal in the T10 body *T11 vertebra with growth throughout the body and into the posterior elements. Compression fracture with fluid-filled cleft and 60% height loss. Ventral epidural tumor at this level which contacts but does not compress the cord *T12 body which is diffuse and associated with small superior endplate fracture. *L1 left posterior body. Cord: Normal signal. Accentuated surface vessels without definite fistula. No evidence of intrathecal tumor. Paraspinal and other soft tissues: Reported separately Disc levels: No significant degenerative changes for age. No degenerative impingement IMPRESSION: Numerous levels of vertebral metastatic disease (listed above) most notable at T11 and T12 where there is confluent body involvement and pathologic fracture. Body fracture at T11 causes 60% height loss and retropulsion with ventral epidural tumor  contacting but not compressing the cord. Electronically Signed   By: Jorje Guild M.D.   On: 11/12/2021 08:51   CT ABDOMEN PELVIS W CONTRAST  Result Date: 12/10/2021 CLINICAL DATA:  A 71 year old male presents for evaluation of nonlocalized abdominal pain and altered mental status. Also found to have findings of bronchogenic or suspected bronchogenic neoplasm on previous imaging. EXAM: CT ANGIOGRAPHY CHEST CT ABDOMEN AND PELVIS WITH CONTRAST  TECHNIQUE: Multidetector CT imaging of the chest was performed using the standard protocol during bolus administration of intravenous contrast. Multiplanar CT image reconstructions and MIPs were obtained to evaluate the vascular anatomy. Multidetector CT imaging of the abdomen and pelvis was performed using the standard protocol during bolus administration of intravenous contrast. RADIATION DOSE REDUCTION: This exam was performed according to the departmental dose-optimization program which includes automated exposure control, adjustment of the mA and/or kV according to patient size and/or use of iterative reconstruction technique. CONTRAST:  151mL OMNIPAQUE IOHEXOL 350 MG/ML SOLN COMPARISON:  Comparison is made with October 31, 2021. FINDINGS: CTA CHEST FINDINGS Cardiovascular: Calcified and noncalcified atheromatous plaque in the thoracic aorta. Heart size mildly enlarged. No substantial pericardial effusion. Changes of median sternotomy for CABG. Central pulmonary vasculature is of normal caliber. Main pulmonary artery opacified to 258 Hounsfield units. Assessment of pulmonary vasculature to the proximal or central segmental level without signs of pulmonary embolism. Some respiratory motion limiting assessment at the lung bases. Mediastinum/Nodes: Top-normal RIGHT paratracheal lymph node (image 28/4) 13 mm. Scattered small lymph nodes elsewhere in the chest. No axillary or thoracic inlet adenopathy. RIGHT paratracheal lymph node is slightly larger than on the previous exam from October 31, 2021. Lungs/Pleura: Interval development of a RIGHT-sided effusion. Cavitary masslike area is mast by this pleural effusion on the previous study appearing less cavitary than on the prior exam. This mass measures as much as 4.3 cm. Perhaps filled with more fluid on today's study. Mild basilar volume loss bilaterally. Musculoskeletal: Lytic changes about the LEFT scapula which has developed in the short interval lesion measuring  3.1 cm (image 8/10) Mottled appearance of the RIGHT scapula which is incompletely imaged (image 3/10) no distinct lesion. This finding new since previous imaging. Lucent appearance of various vertebral bodies, increasingly lucent since previous imaging particularly at the T1, T5, T6, T7, T8 and T9 levels. T10 with mottled heterogeneity as well. Interval compression fracture presumed pathologic of the superior endplate of T6. This shows mild loss of height approximately 20%. Further loss of height at T11 slightly greater than or at 50%. Mild retropulsion of posterior cortical elements is similar to previous imaging. Fractures and abnormal bone extending into the LEFT greater than RIGHT T11 pedicles. This is concerning for unstable fracture. Review of the MIP images confirms the above findings. CT ABDOMEN and PELVIS FINDINGS Hepatobiliary: Hepatic metastatic lesion (image 40/2) 2 cm. No additional focal suspicious hepatic lesions. Portal vein is patent. No pericholecystic stranding. Pancreas: Normal, without mass, inflammation or ductal dilatation. Spleen: Normal. Adrenals/Urinary Tract: Adrenal glands are normal. Hypodense lesion in the interpolar RIGHT kidney is indeterminate potentially a small solid lesion measuring 12 mm (image 38/2) similarly an indeterminate subcentimeter lesion arising from the lower pole measuring 76 Hounsfield units (image 50/2 also in the RIGHT kidney. No suspicious renal lesion on the LEFT. Urinary bladder is unremarkable. Ureter and collecting systems are nondilated. No perinephric stranding. Stomach/Bowel: Colonic diverticulosis. No acute gastrointestinal findings. Vascular/Lymphatic: Aortic atherosclerosis. No sign of aneurysm. Smooth contour of the IVC. There is no gastrohepatic or hepatoduodenal  ligament lymphadenopathy. No retroperitoneal or mesenteric lymphadenopathy. No pelvic sidewall lymphadenopathy. Reproductive: Unremarkable. Other: No pneumoperitoneum.  No signs of ascites.  Musculoskeletal: Lytic lesion in the LEFT femoral head is new over the short interval (image 83/2) 2.1 cm. Patchy lytic process in the LEFT iliac crest also new. Lytic process in the RIGHT femoral head approximately 8 mm (image 80/2) new from previous imaging. T12 compression fracture with further loss of height now approaching 40% loss of height with mild retropulsion of posterior cortical elements along the superior aspect of the tubal body. Metastatic foci more apparent in the posterior aspect of L1 with early violation of posterior cortex and early extension of soft tissue into the anterior spinal canal (image 56/6) New lucent changes in the L2 vertebral body with increasing conspicuity of L3 lucent changes. New pathologic endplate compression approximately 10-20% loss of height at L4 since the prior study. Vague soft tissue along the posterior margin of L4 may represent early extension of tumor into the central canal (image 53/6) Further lucency at L5 and new lucency at the S1 level. Evidence of metastatic disease to RIGHT fourth rib laterally with bony destruction. Review of the MIP images confirms the above findings. IMPRESSION: 1. No evidence of pulmonary embolism. 2. Interval development of a RIGHT-sided effusion. 3. New pleural effusion in the RIGHT chest obscures the tumor which is more dense than on previous imaging. 4. New basilar airspace disease surrounding this lesion does not allow for exclusion of concomitant infection. 5. 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 Pathologic fractures at multiple levels in the thoracic and lumbar spine some new and with worsening of fractures at T11 and T12. Pattern of fractures in T11 is suspicious for unstable injury based on extension in the bilateral pedicles of abnormal bone and fracture LEFT greater than RIGHT. 6. 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 7. Indeterminate lesions in the RIGHT kidney, potentially a small  solid lesion measuring 12 mm. This may be indicative of metastatic disease to the RIGHT kidney versus solid renal neoplasm. 8. Colonic diverticulosis without evidence of acute diverticulitis. 9. Aortic atherosclerosis. Aortic Atherosclerosis (ICD10-I70.0). Critical Value/emergent results of potentially unstable T11 pathologic fracture were called by telephone at the time of interpretation on 12/10/2021 at 9:50 am to provider PA Rona Ravens , who verbally acknowledged these results. Electronically Signed   By: Zetta Bills M.D.   On: 12/10/2021 09:52   DG Chest Port 1 View  Result Date: 12/10/2021 CLINICAL DATA:  Sepsis. EXAM: PORTABLE CHEST 1 VIEW COMPARISON:  November 09, 2021. FINDINGS: The heart size and mediastinal contours are within normal limits. Sternotomy wires are noted. Minimal left basilar subsegmental atelectasis is noted. Mild right infrahilar opacity is noted concerning for atelectasis or infiltrate. The visualized skeletal structures are unremarkable. IMPRESSION: Mild right infrahilar opacity is noted concerning for pneumonia or atelectasis. Followup PA and lateral chest X-ray is recommended in 3-4 weeks following trial of antibiotic therapy to ensure resolution and exclude underlying malignancy. Minimal left basilar subsegmental atelectasis is noted. Electronically Signed   By: Marijo Conception M.D.   On: 12/10/2021 05:18   IR Radiologist Eval & Mgmt  Result Date: 12/08/2021 EXAM: NEW PATIENT OFFICE VISIT CHIEF COMPLAINT: Severe thoracolumbar pain due to pathologic fractures at T11 and T12. Current Pain Level: 1-10 HISTORY OF PRESENT ILLNESS: 71 year old gentleman who has been referred for evaluation and treatment of pathologic fractures at T11-T12. Patient is accompanied his son and his spouse. History  was obtained from the patient, the family, and also from [facility] [place] records. Patient first noted severe acute onset of pain in the lower thoracic region approximately 2 months ago while  lifting a sizable plant pot. Pain was almost immediate and severe without any radiation into the lower extremities. Patient underwent workup involving lumbar CT, which revealed multiple focal osseous lesions highly suspicious of metastatic disease. Most notably there is a pathologic fracture at T11. Full workup revealed a cavitating mass in the right lower lung which on biopsy proved to reveal a metastatic carcinoma. An MRI of the thoracic spine performed November 11, 2021 revealed multiple focal lesions with abnormal signal at T5-T6, T11-T12 and L1. At T11 and T12 compression deformities were noted with abnormal signal probably representing metastatic pathological fractures. The patient has already received his course of radiation to the thoracic spine and lumbar spine. Patient presently remains on round the clock morphine and over-the-counter pain medications for pain relief. At best he says with medications his pain is a 4/5. Without the medication it is usually 8/10. Patient reports the pain is most noticeable and debilitating when turning, lifting, stooping,moving or standing. He denies any radiation of pain into the lower extremities in a radicular manner. Denies any autonomic dysfunction of bowel or bladder. The patient finds it difficult to ambulate even with a walker because of the severe pain. The patient reports no recent new respiratory or cardiovascular symptoms. He denies difficulty with swallowing, abdominal pain, nausea or vomiting. He does complain of constipation related to the pain medications. He denies any dysuria, hematuria, or polyuria. Patient reports a moderate decrease in appetite with associated weight loss. Denies any recent chills, fever or rigors. Diagnosis * : Date . * : Arthritis * : . * : Barrett's esophagus * : 07/04/2016 * : pt states he was told he does not have this. . * : CAD (coronary artery disease), native coronary artery * : 05/2014 * : Non-ST elevation myocardial infarction.  Echocardiogram showed normal LV systolic function with mild to moderate mitral regurgitation. Cardiac catheterization showed significant two-vessel coronary artery disease including the RCA and left anterior descending artery. He underwent CABG at [facility]. . * : Colon polyps * : . * : GERD (gastroesophageal reflux disease) * : . * : Hyperlipidemia * : . * : Hypertension * : . * : MI (myocardial infarction) ([place]) * : . * : Past heart attack, 05/11/2014 * : 07/09/2014 . * : Prediabetes * : . * : S/P CABG x 2 * : 07/09/2014 . * : Sleep apnea * : 03/2019 * : on CPAP SURGICAL HISTORY: Past Surgical History: Procedure * : Laterality * : Date . * : BRONCHIAL BIOPSY * : * : 11/09/2021 * : Procedure: BRONCHIAL BIOPSIES; Surgeon: Garner Nash, DO; Location: [facility]; Service: Pulmonary;; . * : BRONCHIAL BRUSHINGS * : * : 11/09/2021 * : Procedure: BRONCHIAL BRUSHINGS; Surgeon: Garner Nash, DO; Location: [facility]; Service: Pulmonary;; . * : BRONCHIAL NEEDLE ASPIRATION BIOPSY * : * : 11/09/2021 * : Procedure: BRONCHIAL NEEDLE ASPIRATION BIOPSIES; Surgeon: Garner Nash, DO; Location: [facility]; Service: Pulmonary;; . * : CARDIAC CATHETERIZATION * : * : 03/2014 * : [place] . * : cataract * : Right * : 1980 * : Dr. Katy Fitch . * : CORONARY ARTERY BYPASS GRAFT * : * : 05/2014 * : [facility], CABG x 2 . * : EYE SURGERY * : * : . * : HEMORROIDECTOMY * : * : . * :  LUNG BIOPSY * : * : 2006 * : [facility] . * : VIDEO BRONCHOSCOPY WITH ENDOBRONCHIAL ULTRASOUND * : * : 11/09/2021 * : Procedure: VIDEO BRONCHOSCOPY WITH ENDOBRONCHIAL ULTRASOUND; Surgeon: Garner Nash, DO; Location: [facility]; Service: Pulmonary;; I have reviewed the social history and family history with the patient and they are unchanged from previous note. ALLERGIES: Is allergic to plasma protein fraction, icosapent ethyl, and morphine and related. MEDICATIONS: Current Outpatient Medications Medication * : Sig * : Dispense * : Refill . * : folic acid (FOLVITE) 1 MG tablet  * : Take 1 tablet (1 mg total) by mouth daily. * : 30 tablet * : 0 . * : tamsulosin (FLOMAX) 0.4 MG CAPS capsule * : Take 1 capsule (0.4 mg total) by mouth daily after supper. * : 30 capsule * : 1 . * : acetaminophen (TYLENOL) 325 MG tablet * : Take 2 tablets (650 mg total) by mouth every 6 (six) hours as needed for up to 30 doses for mild pain or moderate pain. * : 30 tablet * : 0 . * : acetaminophen (TYLENOL) 500 MG tablet * : Take 500 mg by mouth every 6 (six) hours as needed for moderate pain or headache. * : * : . * : amLODipine (NORVASC) 10 MG tablet * : TAKE 1 TABLET BY MOUTH DAILY (Patient taking differently: Take 10 mg by mouth daily.) * : 90 tablet * : 3 . * : atorvastatin (LIPITOR) 20 MG tablet * : TAKE 1 TABLET BY MOUTH ONCE DAILY (Patient taking differently: Take 20 mg by mouth daily.) * : 90 tablet * : 3 . * : carvedilol (COREG) 6.25 MG tablet * : TAKE 1 TABLET BY MOUTH TWICE DAILY (Patient taking differently: Take 6.25 mg by mouth 2 (two) times daily with a meal.) * : 180 tablet * : 3 . * : clopidogrel (PLAVIX) 75 MG tablet * : TAKE 1 TABLET BY MOUTH ONCE DAILY (Patient taking differently: Take 75 mg by mouth daily.) * : 90 tablet * : 1 . * : dexamethasone (DECADRON) 4 MG tablet * : Take 1 tablet (4 mg total) by mouth daily. * : 30 tablet * : 0 . * : Evolocumab (REPATHA SURECLICK) 626 MG/ML SOAJ * : Inject 1 pen into the skin every 14 (fourteen) days. (Patient not taking: Reported on 10/31/2021) * : 6 mL * : 3 . * : gabapentin (NEURONTIN) 300 MG capsule * : Take 1 capsule (300 mg total) by mouth 2 (two) times daily. * : 60 capsule * : 1 . * : HYDROmorphone (DILAUDID) 2 MG tablet * : Take 1 tablet (2 mg total) by mouth every 4 (four) hours as needed for up to 21 days for severe pain or moderate pain. * : 60 tablet * : 0 . * : losartan (COZAAR) 100 MG tablet * : TAKE 1 TABLET BY MOUTH DAILY (Patient taking differently: Take 100 mg by mouth daily.) * : 90 tablet * : 3 . * : morphine (MS CONTIN) 15 MG 12 hr  tablet * : Take 1 tablet (15 mg total) by mouth every 12 (twelve) hours. * : 60 tablet * : 0 . * : omeprazole (PRILOSEC) 20 MG capsule * : Take 1 capsule (20 mg total) by mouth daily. * : 30 capsule REVIEW OF SYSTEMS: Negative unless as mentioned above PHYSICAL EXAMINATION: A targeted examination of the thoracolumbar spine revealed a new kyphotic curvature as per family with protuberance at  the T11 level. This is associated with moderate to severe tenderness in the T11-T12 regions with physical palpation. He also demonstrated mild tenderness in upper thoracic T6 region. Otherwise, neurologically no gross abnormal lateralizing abnormalities evident. ASSESSMENT AND PLAN: Findings of the MRI scan of the thoracic spine were reviewed with the patient and the family. Brought to their attention was the multi level involvement of the thoracic spine and possibly the upper lumbar spine with signal abnormalities suggestive of metastatic disease. However, the T11 pathologic fracture demonstrated retropulsion of bone posteriorly into the spinal canal with near contact with the conus. At T12 superior endplate compression fracture also probably related to metastatic disease was reviewed. It was felt for pain relief the T12 level would be appropriate for balloon kyphoplasty given no significant retropulsion. Both procedures will be performed in order to alleviate pain related to the pathological fractures. However, at T11 given the retropulsion, it was felt it would be safer to perform a vertebroplasty in order to mitigate potential further retropulsion with injury to the spinal cord. In terms of pain relief the procedures were almost identical. The procedure, the reasons, the potential complications, and alternatives were reviewed. Patient and the family would like to proceed with vertebral body augmentation at T11 and L2 as described. Notably the patient would have to stop his Plavix at least 5 days prior to the procedure.  However, the patient could continue his baby aspirin though. They leave with good understanding and agreement with the above management plan. Electronically Signed   By: Luanne Bras M.D.   On: 12/08/2021 08:08     Future Appointments  Date Time Provider [place]  12/17/2021 11:45 AM [facility] LAB [facility] None  12/17/2021 12:20 PM Truitt Merle, MD [facility] None  12/17/2021  1:30 PM [facility] INFUSION [facility] None  12/29/2021 10:30 AM Bruning, Ashlyn, PA-C [facility] None      LOS: 0 days   Addendum  I have seen the patient, examined him. I agree with the assessment and and plan and have edited the notes.   Mr. Ruppert is being admitted for bilateral pneumonia, altered mental status and pain control.  He was awake, alert, answer questions appropriately when I saw him in the ED, I also spoke with his wife on the phone.  I reviewed his CT scan and lab results, he unfortunately had developed new right pleural effusion, new liver metastasis and worsening diffuse bone metastasis.  His performance status is very low, has been bedbound lately due to the pain and weakness.  He is more interested to see if there is any local procedure or surgery on his spine, to improve his pain and he can walk again.  [facility] will be [facility] to [facility] for evaluation.  He was seen by neurosurgery here.  At this point is not a candidate for chemotherapy, we discussed palliative care alone, he would like to think about it.  Please consult palliative care medicine for [place] discussion and symptoms management. I will f/u on Monday of needed.   Truitt Merle  12/10/2021

## 2021-12-10 NOTE — ED Provider Notes (Signed)
Here with hypoxia and AMS.  Hypoxia of 70s at home, low 90s at 2L.  Hx of metastatic lung cancer.  Awaits CT, suspect PE.  Also has lower quad belly pain, awaits CT.  Need admission.    9:49 AM Pt sign out to DR. Tegeler who will continue with care and will admit as appropriates    Domenic Moras, PA-C 12/10/21 [id number]    Tegeler, Gwenyth Allegra, MD 12/10/21 [phone]

## 2021-12-10 NOTE — Consult Note (Signed)
Neurosurgery Consultation  Reason for Consult: T11 fracture Referring Physician: Marylyn Erickson  CC: LBP/SOB/AMS  HPI: This is a 71 y.o. man w/ h/o NSCLC who presented with AMS, suspected due to pneumonia. NSGY consulted due to ongoing low back pain, has known metastatic disease to the spine s/p XRT. He has been non-ambulatory for weeks now due to LBP, had a scheduled vertebroplasty but it was cancelled after developing a pathologic fracture / canal compromise. No new weakness, numbness, or parasthesias, no recent change in bowel or bladder function. No recent use of anti-platelet or anti-coagulant medications - is off his plavix for the previously scheduled VBplasty.    ROS: A 14 point ROS was performed and is negative except as noted in the HPI.   PMHx:  Past Medical History:  Diagnosis Date   Arthritis    Barrett's esophagus 07/04/2016   pt states he was told he does not have this.   CAD (coronary artery disease), native coronary artery 05/2014   Non-ST elevation myocardial infarction. Echocardiogram showed normal LV systolic function with mild to moderate mitral regurgitation. Cardiac catheterization showed significant two-vessel coronary artery disease including the RCA and left anterior descending artery. He underwent CABG at [facility].   Colon polyps    GERD (gastroesophageal reflux disease)    Hyperlipidemia    Hypertension    MI (myocardial infarction) ([place])    Past heart attack, 05/11/2014 07/09/2014   Prediabetes    S/P CABG x 2 07/09/2014   Sleep apnea 03/2019   on CPAP   FamHx:  Family History  Problem Relation Age of Onset   Stroke Mother    Hypertension Mother    Heart Problems Mother    Diabetes Mother    SocHx:  reports that he quit smoking about 8 years ago. His smoking use included cigars. He has never used smokeless tobacco. He reports current alcohol use of about 7.0 - 21.0 standard drinks per week. He reports that he does not use drugs.  Exam: Vital signs in last 24  hours: Temp:  [97.9 F (36.6 C)-98.7 F (37.1 C)] 97.9 F (36.6 C) (02/17 1910) Pulse Rate:  [105-120] 107 (02/17 1910) Resp:  [12-25] 18 (02/17 1910) BP: (126-151)/(74-107) 141/107 (02/17 1910) SpO2:  [90 %-99 %] 98 % (02/17 1910) Weight:  [76.7 kg] 76.7 kg (02/17 0427) General: Awake, alert, cooperative, lying in bed in NAD Head: Normocephalic and atruamatic HEENT: Neck supple Psych: affect full, reactive Cardiac: RRR Abdomen: S NT ND Extremities: Warm and well perfused x4 Neuro: AOx3, PERRL, EOMI, FS Strength 5/5 x4, SILTx4, no hoffman's, no clonus   Assessment and Plan: 72 y.o. man w/ NSCLC and metastatic disease, thoracic back pain limiting ambulation. CT chest and recent MRI T-spine personally reviewed, which show metastatic disease worst at T11 with a burst fracture and some retropulsion and some new fracture lines across the pedicles bilaterally.   -discussed with the patient and his son at length, difficult situation. I recommend to them that we treat his pulmonary disease and see how he feels systemically. If he regains some physiologic strength and wants to regain ambulation, could do percutaneous instrumentation to stabilize the fracture, which wouldn't be very invasive and should only require one extra night in the hospital. Obviously will depend on his pulmonary disease / response to Abx / etc, which we discussed. No rush to deciding, will see how he does and re-discuss. -please call with any concerns or questions  Judith Part, MD 12/10/21 8:23 PM [facility]

## 2021-12-10 NOTE — ED Provider Notes (Signed)
[facility] DEPT Provider Note   CSN: [id number] Arrival date & time: 12/10/21  0403     History  Chief Complaint  Patient presents with   Altered Mental Status    Carlos Erickson is a 71 y.o. male.  HPI  71 year old male with a history of sleep apnea, Barrett's esophagus, MI s/p CABG, CAD, polyps, GERD, hyperlipidemia, hypertension, prediabetes, who presents to the emergency department today for altered mental status.  Patient also has had dysuria for 1 month per EMS report.  He complains of some shortness of breath but denies cough.  There is a level 5 caveat due to his altered mental status.  4:46 AM Discussed case with Ruby Burger, pts spouse, she states patient was coughing and seemed confused. He was also c/o chest pain. She notes that he has had urinary frequency and urinary incontinence. He has a mild cough usually but today was much worse and he seemed short of breath.   Home Medications Prior to Admission medications   Medication Sig Start Date End Date Taking? Authorizing Provider  acetaminophen (TYLENOL) 325 MG tablet Take 2 tablets (650 mg total) by mouth every 6 (six) hours as needed for up to 30 doses for mild pain or moderate pain. 10/31/21   Wyvonnia Dusky, MD  acetaminophen (TYLENOL) 500 MG tablet Take 500 mg by mouth every 6 (six) hours as needed for moderate pain or headache.    [provider]  amLODipine (NORVASC) 10 MG tablet TAKE 1 TABLET BY MOUTH  DAILY Patient taking differently: Take 10 mg by mouth daily. 06/09/21   Loel Dubonnet, NP  atorvastatin (LIPITOR) 20 MG tablet TAKE 1 TABLET BY MOUTH ONCE DAILY Patient taking differently: Take 20 mg by mouth daily. 09/27/21   Loel Dubonnet, NP  carvedilol (COREG) 6.25 MG tablet TAKE 1 TABLET BY MOUTH  TWICE DAILY Patient taking differently: Take 6.25 mg by mouth 2 (two) times daily with a meal. 08/27/21   Wellington Hampshire, MD  clopidogrel (PLAVIX) 75 MG tablet TAKE 1  TABLET BY MOUTH ONCE DAILY Patient taking differently: Take 75 mg by mouth daily. 09/21/21   Wellington Hampshire, MD  dexamethasone (DECADRON) 4 MG tablet Take 1 tablet (4 mg total) by mouth daily. 11/26/21   Pickenpack-Cousar, Carlena Sax, NP  Evolocumab (REPATHA SURECLICK) 440 MG/ML SOAJ Inject 1 pen into the skin every 14 (fourteen) days. Patient not taking: Reported on 10/31/2021 07/28/21   Marrianne Mood D, PA-C  folic acid (FOLVITE) 1 MG tablet Take 1 tablet (1 mg total) by mouth daily. 12/03/21   Truitt Merle, MD  gabapentin (NEURONTIN) 300 MG capsule Take 1 capsule (300 mg total) by mouth 2 (two) times daily. 11/19/21   Truitt Merle, MD  losartan (COZAAR) 100 MG tablet TAKE 1 TABLET BY MOUTH  DAILY Patient taking differently: Take 100 mg by mouth daily. 08/27/21   Wellington Hampshire, MD  morphine (MS CONTIN) 15 MG 12 hr tablet Take 1 tablet (15 mg total) by mouth every 12 (twelve) hours. 11/26/21   Pickenpack-Cousar, Carlena Sax, NP  omeprazole (PRILOSEC) 20 MG capsule Take 1 capsule (20 mg total) by mouth daily. 11/26/21   Pickenpack-Cousar, Carlena Sax, NP  ondansetron (ZOFRAN) 8 MG tablet Take 1 tablet (8 mg total) by mouth 2 (two) times daily as needed (Nausea or vomiting). Start if needed on the third day after carboplatin. 12/03/21   Truitt Merle, MD  prochlorperazine (COMPAZINE) 10 MG tablet Take 1 tablet (  10 mg total) by mouth every 6 (six) hours as needed (Nausea or vomiting). 12/03/21   Truitt Merle, MD  tamsulosin (FLOMAX) 0.4 MG CAPS capsule Take 1 capsule (0.4 mg total) by mouth daily after supper. 12/03/21   Truitt Merle, MD      Allergies    Plasma protein fraction, Icosapent ethyl, and Morphine and related    Review of Systems   Review of Systems See HPI for pertinent positives or negatives.   Physical Exam Updated Vital Signs BP (!) 134/92    Pulse (!) 111    Temp 98.7 F (37.1 C) (Rectal)    Resp 18    Ht 5' 8"  (1.727 m)    Wt 76.7 kg    SpO2 90%    BMI 25.70 kg/m  Physical Exam Vitals and nursing  note reviewed.  Constitutional:      General: He is not in acute distress.    Appearance: He is well-developed.  HENT:     Head: Normocephalic and atraumatic.  Eyes:     Conjunctiva/sclera: Conjunctivae normal.  Cardiovascular:     Rate and Rhythm: Regular rhythm. Tachycardia present.     Heart sounds: No murmur heard. Pulmonary:     Effort: Pulmonary effort is normal.     Comments: Decreased BS bilat, tachypnea Abdominal:     General: Bowel sounds are normal.     Palpations: Abdomen is soft.     Tenderness: There is no abdominal tenderness.  Musculoskeletal:        General: No swelling.     Cervical back: Neck supple.  Skin:    General: Skin is warm and dry.     Capillary Refill: Capillary refill takes less than 2 seconds.  Neurological:     Mental Status: He is alert.  Psychiatric:        Mood and Affect: Mood normal.    ED Results / Procedures / Treatments   Labs (all labs ordered are listed, but only abnormal results are displayed) Labs Reviewed  COMPREHENSIVE METABOLIC PANEL - Abnormal; Notable for the following components:      Result Value   Sodium 127 (*)    Potassium 3.3 (*)    Chloride 96 (*)    Glucose, Bld 122 (*)    Creatinine, Ser 0.59 (*)    Calcium 8.6 (*)    Albumin 2.6 (*)    Alkaline Phosphatase 141 (*)    All other components within normal limits  CBC - Abnormal; Notable for the following components:   WBC 13.5 (*)    RBC 3.77 (*)    Hemoglobin 11.2 (*)    HCT 32.7 (*)    All other components within normal limits  URINALYSIS, ROUTINE W REFLEX MICROSCOPIC - Abnormal; Notable for the following components:   APPearance CLOUDY (*)    Ketones, ur 5 (*)    All other components within normal limits  BLOOD GAS, VENOUS - Abnormal; Notable for the following components:   pH, Ven 7.55 (*)    pCO2, Ven 28 (*)    pO2, Ven 53 (*)    Acid-Base Excess 3.1 (*)    All other components within normal limits  CBG MONITORING, ED - Abnormal; Notable for the  following components:   Glucose-Capillary 133 (*)    All other components within normal limits  RESP PANEL BY RT-PCR (FLU A&B, COVID) ARPGX2  CULTURE, BLOOD (ROUTINE X 2)  CULTURE, BLOOD (ROUTINE X 2)  URINE CULTURE  LACTIC ACID, PLASMA  PROTIME-INR  APTT  LACTIC ACID, PLASMA  TROPONIN I (HIGH SENSITIVITY)  TROPONIN I (HIGH SENSITIVITY)    EKG EKG Interpretation  Date/Time:  Friday December 10 2021 05:12:02 EST Ventricular Rate:  114 PR Interval:  140 QRS Duration: 94 QT Interval:  329 QTC Calculation: 453 R Axis:   71 Text Interpretation: Sinus tachycardia Atrial premature complex Confirmed by Quintella Reichert [phone]) on 12/10/2021 5:26:05 AM  Radiology DG Chest Port 1 View  Result Date: 12/10/2021 CLINICAL DATA:  Sepsis. EXAM: PORTABLE CHEST 1 VIEW COMPARISON:  November 09, 2021. FINDINGS: The heart size and mediastinal contours are within normal limits. Sternotomy wires are noted. Minimal left basilar subsegmental atelectasis is noted. Mild right infrahilar opacity is noted concerning for atelectasis or infiltrate. The visualized skeletal structures are unremarkable. IMPRESSION: Mild right infrahilar opacity is noted concerning for pneumonia or atelectasis. Followup PA and lateral chest X-ray is recommended in 3-4 weeks following trial of antibiotic therapy to ensure resolution and exclude underlying malignancy. Minimal left basilar subsegmental atelectasis is noted. Electronically Signed   By: Marijo Conception M.D.   On: 12/10/2021 05:18   IR Radiologist Eval & Mgmt  Result Date: 12/08/2021 EXAM: NEW PATIENT OFFICE VISIT CHIEF COMPLAINT: Severe thoracolumbar pain due to pathologic fractures at T11 and T12. Current Pain Level: 1-10 HISTORY OF PRESENT ILLNESS: 71 year old gentleman who has been referred for evaluation and treatment of pathologic fractures at T11-T12. Patient is accompanied his son and his spouse. History was obtained from the patient, the family, and also from  [facility] records. Patient first noted severe acute onset of pain in the lower thoracic region approximately 2 months ago while lifting a sizable plant pot. Pain was almost immediate and severe without any radiation into the lower extremities. Patient underwent workup involving lumbar CT, which revealed multiple focal osseous lesions highly suspicious of metastatic disease. Most notably there is a pathologic fracture at T11. Full workup revealed a cavitating mass in the right lower lung which on biopsy proved to reveal a metastatic carcinoma. An MRI of the thoracic spine performed November 11, 2021 revealed multiple focal lesions with abnormal signal at T5-T6, T11-T12 and L1. At T11 and T12 compression deformities were noted with abnormal signal probably representing metastatic pathological fractures. The patient has already received his course of radiation to the thoracic spine and lumbar spine. Patient presently remains on round the clock morphine and over-the-counter pain medications for pain relief. At best he says with medications his pain is a 4/5. Without the medication it is usually 8/10. Patient reports the pain is most noticeable and debilitating when turning, lifting, stooping,moving or standing. He denies any radiation of pain into the lower extremities in a radicular manner. Denies any autonomic dysfunction of bowel or bladder. The patient finds it difficult to ambulate even with a walker because of the severe pain. The patient reports no recent new respiratory or cardiovascular symptoms. He denies difficulty with swallowing, abdominal pain, nausea or vomiting. He does complain of constipation related to the pain medications. He denies any dysuria, hematuria, or polyuria. Patient reports a moderate decrease in appetite with associated weight loss. Denies any recent chills, fever or rigors. Diagnosis * : Date . * : Arthritis * : . * : Barrett's esophagus * : 07/04/2016 * : pt states he was told  he does not have this. . * : CAD (coronary artery disease), native coronary artery * : 05/2014 * : Non-ST elevation myocardial infarction. Echocardiogram showed normal LV systolic function  with mild to moderate mitral regurgitation. Cardiac catheterization showed significant two-vessel coronary artery disease including the RCA and left anterior descending artery. He underwent [facility]. . * : Colon polyps * : . * : GERD (gastroesophageal reflux disease) * : . * : Hyperlipidemia * : . * : Hypertension * : . * : MI (myocardial infarction) ([place]) * : . * : Past heart attack, 05/11/2014 * : 07/09/2014 . * : Prediabetes * : . * : S/P CABG x 2 * : 07/09/2014 . * : Sleep apnea * : 03/2019 * : on CPAP SURGICAL HISTORY: Past Surgical History: Procedure * : Laterality * : Date . * : BRONCHIAL BIOPSY * : * : 11/09/2021 * : Procedure: BRONCHIAL BIOPSIES; Surgeon: Garner Nash, DO; Location: [facility]; Service: Pulmonary;; . * : BRONCHIAL BRUSHINGS * : * : 11/09/2021 * : Procedure: BRONCHIAL BRUSHINGS; Surgeon: Garner Nash, DO; Location: [facility]; Service: Pulmonary;; . * : BRONCHIAL NEEDLE ASPIRATION BIOPSY * : * : 11/09/2021 * : Procedure: BRONCHIAL NEEDLE ASPIRATION BIOPSIES; Surgeon: Garner Nash, DO; Location: [facility]; Service: Pulmonary;; . * : CARDIAC CATHETERIZATION * : * : 03/2014 * : [place] . * : cataract * : Right * : 1980 * : Dr. Katy Fitch . * : CORONARY ARTERY BYPASS GRAFT * : * : 05/2014 * : [facility], CABG x 2 . * : EYE SURGERY * : * : . * : HEMORROIDECTOMY * : * : . * : LUNG BIOPSY * : * : 2006 * : [facility] . * : VIDEO BRONCHOSCOPY WITH ENDOBRONCHIAL ULTRASOUND * : * : 11/09/2021 * : Procedure: VIDEO BRONCHOSCOPY WITH ENDOBRONCHIAL ULTRASOUND; Surgeon: Garner Nash, DO; Location: [facility]; Service: Pulmonary;; I have reviewed the social history and family history with the patient and they are unchanged from previous note. ALLERGIES: Is allergic to plasma protein fraction, icosapent ethyl, and morphine  and related. MEDICATIONS: Current Outpatient Medications Medication * : Sig * : Dispense * : Refill . * : folic acid (FOLVITE) 1 MG tablet * : Take 1 tablet (1 mg total) by mouth daily. * : 30 tablet * : 0 . * : tamsulosin (FLOMAX) 0.4 MG CAPS capsule * : Take 1 capsule (0.4 mg total) by mouth daily after supper. * : 30 capsule * : 1 . * : acetaminophen (TYLENOL) 325 MG tablet * : Take 2 tablets (650 mg total) by mouth every 6 (six) hours as needed for up to 30 doses for mild pain or moderate pain. * : 30 tablet * : 0 . * : acetaminophen (TYLENOL) 500 MG tablet * : Take 500 mg by mouth every 6 (six) hours as needed for moderate pain or headache. * : * : . * : amLODipine (NORVASC) 10 MG tablet * : TAKE 1 TABLET BY MOUTH DAILY (Patient taking differently: Take 10 mg by mouth daily.) * : 90 tablet * : 3 . * : atorvastatin (LIPITOR) 20 MG tablet * : TAKE 1 TABLET BY MOUTH ONCE DAILY (Patient taking differently: Take 20 mg by mouth daily.) * : 90 tablet * : 3 . * : carvedilol (COREG) 6.25 MG tablet * : TAKE 1 TABLET BY MOUTH TWICE DAILY (Patient taking differently: Take 6.25 mg by mouth 2 (two) times daily with a meal.) * : 180 tablet * : 3 . * : clopidogrel (PLAVIX) 75 MG tablet * : TAKE 1 TABLET BY MOUTH ONCE DAILY (Patient taking differently: Take 75  mg by mouth daily.) * : 90 tablet * : 1 . * : dexamethasone (DECADRON) 4 MG tablet * : Take 1 tablet (4 mg total) by mouth daily. * : 30 tablet * : 0 . * : Evolocumab (REPATHA SURECLICK) 254 MG/ML SOAJ * : Inject 1 pen into the skin every 14 (fourteen) days. (Patient not taking: Reported on 10/31/2021) * : 6 mL * : 3 . * : gabapentin (NEURONTIN) 300 MG capsule * : Take 1 capsule (300 mg total) by mouth 2 (two) times daily. * : 60 capsule * : 1 . * : HYDROmorphone (DILAUDID) 2 MG tablet * : Take 1 tablet (2 mg total) by mouth every 4 (four) hours as needed for up to 21 days for severe pain or moderate pain. * : 60 tablet * : 0 . * : losartan (COZAAR) 100 MG tablet * : TAKE 1  TABLET BY MOUTH DAILY (Patient taking differently: Take 100 mg by mouth daily.) * : 90 tablet * : 3 . * : morphine (MS CONTIN) 15 MG 12 hr tablet * : Take 1 tablet (15 mg total) by mouth every 12 (twelve) hours. * : 60 tablet * : 0 . * : omeprazole (PRILOSEC) 20 MG capsule * : Take 1 capsule (20 mg total) by mouth daily. * : 30 capsule REVIEW OF SYSTEMS: Negative unless as mentioned above PHYSICAL EXAMINATION: A targeted examination of the thoracolumbar spine revealed a new kyphotic curvature as per family with protuberance at the T11 level. This is associated with moderate to severe tenderness in the T11-T12 regions with physical palpation. He also demonstrated mild tenderness in upper thoracic T6 region. Otherwise, neurologically no gross abnormal lateralizing abnormalities evident. ASSESSMENT AND PLAN: Findings of the MRI scan of the thoracic spine were reviewed with the patient and the family. Brought to their attention was the multi level involvement of the thoracic spine and possibly the upper lumbar spine with signal abnormalities suggestive of metastatic disease. However, the T11 pathologic fracture demonstrated retropulsion of bone posteriorly into the spinal canal with near contact with the conus. At T12 superior endplate compression fracture also probably related to metastatic disease was reviewed. It was felt for pain relief the T12 level would be appropriate for balloon kyphoplasty given no significant retropulsion. Both procedures will be performed in order to alleviate pain related to the pathological fractures. However, at T11 given the retropulsion, it was felt it would be safer to perform a vertebroplasty in order to mitigate potential further retropulsion with injury to the spinal cord. In terms of pain relief the procedures were almost identical. The procedure, the reasons, the potential complications, and alternatives were reviewed. Patient and the family would like to proceed with vertebral  body augmentation at T11 and L2 as described. Notably the patient would have to stop his Plavix at least 5 days prior to the procedure. However, the patient could continue his baby aspirin though. They leave with good understanding and agreement with the above management plan. Electronically Signed   By: Luanne Bras M.D.   On: 12/08/2021 08:08    Procedures Procedures    Medications Ordered in ED Medications  lactated ringers infusion (0 mLs Intravenous Stopped 12/10/21 0600)  metroNIDAZOLE (FLAGYL) IVPB 500 mg (500 mg Intravenous New Bag/Given 12/10/21 0551)  vancomycin (VANCOREADY) IVPB 1500 mg/300 mL (1,500 mg Intravenous New Bag/Given 12/10/21 0646)  potassium chloride 10 mEq in 100 mL IVPB (10 mEq Intravenous New Bag/Given 12/10/21 0647)  permethrin (ELIMITE) 1 %  lotion (has no administration in time range)  ceFEPIme (MAXIPIME) 2 g in sodium chloride 0.9 % 100 mL IVPB (0 g Intravenous Stopped 12/10/21 0600)    ED Course/ Medical Decision Making/ A&P                           Medical Decision Making Amount and/or Complexity of Data Reviewed Labs: ordered. Radiology: ordered. ECG/medicine tests: ordered.  Risk Prescription drug management.   This patient presents to the ED for concern of AMS, this involves an extensive number of treatment options, and is a complaint that carries with it a high risk of complications and morbidity.  The differential diagnosis includes but is not limited to infectious cause, pe, acs, cva, ich   Comorbidities that complicate the patient evaluation: Patients presentation is complicated by their history of HTN, GERD, CABG, CAD, peripheral neuropathy, lung CA, bone mets  Additional history obtained: Additional history obtained from family and spouse Records reviewed previous admission documents and Care Everywhere/External Records  Lab Tests: I Ordered, and personally interpreted labs.  The pertinent results include:   CBC with a  leukocytosis  of 13k, anemia is present as well which is new CMP with hyponatremia, hypokalemia, hypochloremia, bun/cr and lfts wnl, elevated alk phos Coags wnl Trop pending at shift change UA  with ketonuria but no signs of infection Lactic acid is negative Blood cultures obtained  Imaging Studies ordered: I ordered, independently visualized, and interpreted imaging which showed  CXR - Mild right infrahilar opacity is noted concerning for pneumonia or atelectasis.  Minimal left basilar subsegmental atelectasis is noted. CT head/chest/abd/pelvis - pending at shift change  I agree with the radiologist interpretation  Cardiac Monitoring: The patient was maintained on a cardiac monitor.  I personally viewed and interpreted the cardiac monitor which showed an underlying rhythm of:  sinus tachycardia  Medicines ordered and prescription drug management: I ordered medication including ivf, abx  for possible sepsis  Reevaluation of the patient after these medicines showed that the patient    stayed the same  Critical Interventions: ivf, abx, supplemental o2  Complexity of problems addressed: Patients presentation is most consistent with  acute presentation with potential threat to life or bodily function  At shift change, care transitioned to Domenic Moras, PA-C with plan to f/u on pending imaging and admit patient.    Final Clinical Impression(s) / ED Diagnoses Final diagnoses:  Acute respiratory failure with hypoxia [facility])    Rx / DC Orders ED Discharge Orders     None         Bishop Dublin 12/10/21 [id number]    Quintella Reichert, MD 12/13/21 [phone]

## 2021-12-10 NOTE — H&P (Signed)
History and Physical    Patient: Carlos Erickson QPY:[id number] DOB: 11-25-1950 DOA: 12/10/2021 DOS: the patient was seen and examined on 12/10/2021 PCP: Owens Loffler, MD  Patient coming from: Home  Chief Complaint:  Chief Complaint  Patient presents with   Altered Mental Status    HPI: Carlos Erickson is a 71 y.o. male with medical history significant of NSCLC w/ bone mets, HTN, HLD, CAD s/p CABG. Presenting with AMS and dyspnea. History from wife. She said she noticed that he was in a coughing spell this morning. He couldn't seem to catch his breath and he seemed frustrated. He seemed to be in distress, but also confused. She became concerned and called for EMS. She denies any other aggravating or alleviating factors.  Of note, the patient reports that he also had increased back pain over last night and his home meds were not helping.    Review of Systems: As mentioned in the history of present illness. All other systems reviewed and are negative. Past Medical History:  Diagnosis Date   Arthritis    Barrett's esophagus 07/04/2016   pt states he was told he does not have this.   CAD (coronary artery disease), native coronary artery 05/2014   Non-ST elevation myocardial infarction. Echocardiogram showed normal LV systolic function with mild to moderate mitral regurgitation. Cardiac catheterization showed significant two-vessel coronary artery disease including the RCA and left anterior descending artery. He underwent CABG at [facility].   Colon polyps    GERD (gastroesophageal reflux disease)    Hyperlipidemia    Hypertension    MI (myocardial infarction) ([place])    Past heart attack, 05/11/2014 07/09/2014   Prediabetes    S/P CABG x 2 07/09/2014   Sleep apnea 03/2019   on CPAP   Past Surgical History:  Procedure Laterality Date   BRONCHIAL BIOPSY  11/09/2021   Procedure: BRONCHIAL BIOPSIES;  Surgeon: Garner Nash, DO;  Location: [facility];  Service: Pulmonary;;   BRONCHIAL  BRUSHINGS  11/09/2021   Procedure: BRONCHIAL BRUSHINGS;  Surgeon: Garner Nash, DO;  Location: [facility];  Service: Pulmonary;;   BRONCHIAL NEEDLE ASPIRATION BIOPSY  11/09/2021   Procedure: BRONCHIAL NEEDLE ASPIRATION BIOPSIES;  Surgeon: Garner Nash, DO;  Location: [facility];  Service: Pulmonary;;   CARDIAC CATHETERIZATION  03/2014   [facility]   cataract Right 1980   Dr. Katy Fitch   CORONARY ARTERY BYPASS GRAFT  05/2014   [facility], CABG x 2   EYE SURGERY     HEMORROIDECTOMY     IR RADIOLOGIST EVAL & MGMT  12/08/2021   LUNG BIOPSY  2006   [facility]   VIDEO BRONCHOSCOPY WITH ENDOBRONCHIAL ULTRASOUND  11/09/2021   Procedure: VIDEO BRONCHOSCOPY WITH ENDOBRONCHIAL ULTRASOUND;  Surgeon: Garner Nash, DO;  Location: [facility];  Service: Pulmonary;;   Social History:  reports that he quit smoking about 8 years ago. His smoking use included cigars. He has never used smokeless tobacco. He reports current alcohol use of about 7.0 - 21.0 standard drinks per week. He reports that he does not use drugs.  Allergies  Allergen Reactions   Plasma Protein Fraction Anaphylaxis    Whole body rash, hypotension intra-operatively during CABG, responded to benadryl and famotidine   Icosapent Ethyl     Joint pain   Morphine And Related Other (See Comments)    hallucanations    Family History  Problem Relation Age of Onset   Stroke Mother    Hypertension Mother    Heart  Problems Mother    Diabetes Mother     Prior to Admission medications   Medication Sig Start Date End Date Taking? Authorizing Provider  acetaminophen (TYLENOL) 325 MG tablet Take 2 tablets (650 mg total) by mouth every 6 (six) hours as needed for up to 30 doses for mild pain or moderate pain. 10/31/21   Wyvonnia Dusky, MD  acetaminophen (TYLENOL) 500 MG tablet Take 500 mg by mouth every 6 (six) hours as needed for moderate pain or headache.    [provider]  amLODipine (NORVASC) 10 MG tablet TAKE 1 TABLET BY MOUTH   DAILY Patient taking differently: Take 10 mg by mouth daily. 06/09/21   Loel Dubonnet, NP  atorvastatin (LIPITOR) 20 MG tablet TAKE 1 TABLET BY MOUTH ONCE DAILY Patient taking differently: Take 20 mg by mouth daily. 09/27/21   Loel Dubonnet, NP  carvedilol (COREG) 6.25 MG tablet TAKE 1 TABLET BY MOUTH  TWICE DAILY Patient taking differently: Take 6.25 mg by mouth 2 (two) times daily with a meal. 08/27/21   Wellington Hampshire, MD  clopidogrel (PLAVIX) 75 MG tablet TAKE 1 TABLET BY MOUTH ONCE DAILY Patient taking differently: Take 75 mg by mouth daily. 09/21/21   Wellington Hampshire, MD  dexamethasone (DECADRON) 4 MG tablet Take 1 tablet (4 mg total) by mouth daily. 11/26/21   Pickenpack-Cousar, Carlena Sax, NP  Evolocumab (REPATHA SURECLICK) 315 MG/ML SOAJ Inject 1 pen into the skin every 14 (fourteen) days. Patient not taking: Reported on 10/31/2021 07/28/21   Marrianne Mood D, PA-C  folic acid (FOLVITE) 1 MG tablet Take 1 tablet (1 mg total) by mouth daily. 12/03/21   Truitt Merle, MD  gabapentin (NEURONTIN) 300 MG capsule Take 1 capsule (300 mg total) by mouth 2 (two) times daily. 11/19/21   Truitt Merle, MD  losartan (COZAAR) 100 MG tablet TAKE 1 TABLET BY MOUTH  DAILY Patient taking differently: Take 100 mg by mouth daily. 08/27/21   Wellington Hampshire, MD  morphine (MS CONTIN) 15 MG 12 hr tablet Take 1 tablet (15 mg total) by mouth every 12 (twelve) hours. 11/26/21   Pickenpack-Cousar, Carlena Sax, NP  omeprazole (PRILOSEC) 20 MG capsule Take 1 capsule (20 mg total) by mouth daily. 11/26/21   Pickenpack-Cousar, Carlena Sax, NP  ondansetron (ZOFRAN) 8 MG tablet Take 1 tablet (8 mg total) by mouth 2 (two) times daily as needed (Nausea or vomiting). Start if needed on the third day after carboplatin. 12/03/21   Truitt Merle, MD  prochlorperazine (COMPAZINE) 10 MG tablet Take 1 tablet (10 mg total) by mouth every 6 (six) hours as needed (Nausea or vomiting). 12/03/21   Truitt Merle, MD  tamsulosin (FLOMAX) 0.4 MG CAPS capsule  Take 1 capsule (0.4 mg total) by mouth daily after supper. 12/03/21   Truitt Merle, MD    Physical Exam: Vitals:   12/10/21 1115 12/10/21 1145 12/10/21 1215 12/10/21 1305  BP: 126/83 138/89 126/79 133/83  Pulse: (!) 111 (!) 110 (!) 110 (!) 109  Resp: _0 Temp:      TempSrc:      SpO2: 97% 98% 97% 98%  Weight:      Height:       General: 71 y.o. male resting in bed in NAD Eyes: PERRL, normal sclera ENMT: Nares patent w/o discharge, orophaynx clear, dentition normal, ears w/o discharge/lesions/ulcers Neck: Supple, trachea midline Cardiovascular: tachy, +S1, S2, no m/g/r, equal pulses throughout Respiratory: decreased at bases, no w/r, soft  rhonchi scattered, slightly increased WOB GI: BS+, NDNT, no masses noted, no organomegaly noted MSK: No e/c/c Skin: No rashes, bruises, ulcerations noted Neuro: A&O x 3, no focal deficits Psyc: Appropriate interaction and affect, calm/cooperative   Data Reviewed:  Na+ 127 K+ 3.3 Alk phos 1.41 WBC 13.5  CTA chest/CT ab/pel w/ contrast: 2. Interval development of a RIGHT-sided effusion. 3. New pleural effusion in the RIGHT chest obscures the tumor which is more dense than on previous imaging. 4. New basilar airspace disease surrounding this lesion does not allow for exclusion of concomitant infection. 5. 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 Pathologic fractures at multiple levels in the thoracic and lumbar spine some new and with worsening of fractures at T11 and T12. Pattern of fractures in T11 is suspicious for unstable injury based on extension in the bilateral pedicles of abnormal bone and fracture LEFT greater than RIGHT. 6. 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 7. Indeterminate lesions in the RIGHT kidney, potentially a small solid lesion measuring 12 mm. This may be indicative of metastatic disease to the RIGHT kidney versus solid renal neoplasm.  CTH: No acute intracranial  pathology.  Assessment and Plan: No notes have been filed under this hospital service. Service: Hospitalist Bibasilar PNA Right pleural effusion SIRS     - admit to inpt, progressive at [facility]     - continue vanc, cefepime, add nebs     - wean O2 as able     - check urine legionella, strep     - COVID/flu negative     - will likely need tap of that effusion while he is here, but lets PNA treated first  Multiple pathologic fractures of the thoracic and lumbar spine Unstable T11 fracture     - neurosurgery onboard and requesting transfer to [facility]; appreciate their assistance, will await final recs     - pain control  NSCLC w/ bone mets     - imaging notes mets to liver and possibly to right kidney     - onco onboard, appreciate assistance     - will consult palliative care  HLD     - continue home regimen when confirmed  CAD     - continue home regimen when confirmed  HTN     - continue home regimen when confirmed  Hyponatremia Hypokalemia     - check Mg2+, replace K+     - hypoNa+ is likely multifactorial; will get a little fluids today, follow renal function panels  Advance Care Planning:   Code Status: FULL  Consults: Oncology, Neurosurgery  Family Communication: w/ wife by phone  Severity of Illness: The appropriate patient status for this patient is INPATIENT. Inpatient status is judged to be reasonable and necessary in order to provide the required intensity of service to ensure the patient's safety. The patient's presenting symptoms, physical exam findings, and initial radiographic and laboratory data in the context of their chronic comorbidities is felt to place them at high risk for further clinical deterioration. Furthermore, it is not anticipated that the patient will be medically stable for discharge from the hospital within 2 midnights of admission.   * I certify that at the point of admission it is my clinical judgment that the patient will require inpatient  hospital care spanning beyond 2 midnights from the point of admission due to high intensity of service, high risk for further deterioration and high frequency of surveillance required.*  Author:  Jonnie Finner, DO 12/10/2021 1:45 PM  For on call review [email or web address].

## 2021-12-10 NOTE — ED Notes (Signed)
Patient was given a bed bath in room 12 and was moved to room 10.

## 2021-12-10 NOTE — Progress Notes (Signed)
A consult was received from an ED physician for Vancomycin and Cefepime per pharmacy dosing.  The patient's profile has been reviewed for ht/wt/allergies/indication/available labs.    A one time order has been placed for Vancomycin 1500mg  IV and Cefepime 2gm IV.    Further antibiotics/pharmacy consults should be ordered by admitting physician if indicated.                       Thank you, Everette Rank, PharmD 12/10/2021  4:40 AM

## 2021-12-10 NOTE — Progress Notes (Signed)
Pharmacy Antibiotic Note  Carlos Erickson is a 71 y.o. male admitted on 12/10/2021 with altered mental status and dyspnea.  CT angio with right sided pleural effusion and basilar airspace disease (cannot exclude concomitant infection).  Pharmacy has been consulted for vancomycin and cefepime dosing for HCAP.  WBC 13.5, afebrile, tachycardic, LA 1.4 >>1.2  Cefepime 2g IV x1 and Vancomycin 1500mg  IV x1 given ~ 0445 on 2/17  Plan: Cefepime 2g IV q8 hours Vancomycin 1000mg  IV q12 hours (eAUC 500, Scr 0.8, Vd 0.72) F/u MRSA PCR and de-escalate vancomycin as able Monitor clinical improvement, renal function, ability to narrow antibiotics  Height: 5\' 8"  (172.7 cm) Weight: 76.7 kg (169 lb) IBW/kg (Calculated) : 68.4  Temp (24hrs), Avg:98.6 F (37 C), Min:98.5 F (36.9 C), Max:98.7 F (37.1 C)  Recent Labs  Lab 12/10/21 0438 12/10/21 0824  WBC 13.5*  --   CREATININE 0.59*  --   LATICACIDVEN 1.4 1.2    Estimated Creatinine Clearance: 81.9 mL/min (A) (by C-G formula based on SCr of 0.59 mg/dL (L)).    Allergies  Allergen Reactions   Plasma Protein Fraction Anaphylaxis    Whole body rash, hypotension intra-operatively during CABG, responded to benadryl and famotidine   Icosapent Ethyl     Joint pain   Morphine And Related Other (See Comments)    hallucanations    Antimicrobials this admission: Vancomycin 2/17 >>  Cefepime 2/17 >>   Dose adjustments this admission:   Microbiology results: 2/17 BCx: ordered 2/17 UCx:  ordered 2/17 MRSA PCR: ordered  Thank you for allowing pharmacy to be a part of this patients care.  Dimple Nanas, PharmD 12/10/2021 4:26 PM

## 2021-12-10 NOTE — ED Notes (Signed)
One set of blood cultures obtained

## 2021-12-10 NOTE — ED Notes (Signed)
Report called to [place].  [facility] to be called for transport

## 2021-12-10 NOTE — ED Provider Notes (Signed)
9:50 AM Care assumed from [place], [place].  At time of transfer care, patient is awaiting for results of CT imaging prior to admission for new hypoxia and altered mental status.  Radiology called previous team to report that patient now has worsened pathologic fractures in his thoracic spine.  CT also shows evidence of possible subtle pneumonia, unstable fracture from the previous known pathologic fracture of the T11 spine, new pleural effusion, and the evidence of solid organ metastasis.  Due to the hypoxia, pneumonia, and unstable fracture, he will need admission but will call neurosurgery first to make sure there is no acute need for procedure and to discuss what braces might be appropriate and we will call medicine for admission.  11:12 AM Just reassessed patient and his pain is still severe.  We will get more pain medicine.  He reports that Dr. Estanislado Pandy was do a procedure in several days on his back for the known fractures but now that it appears unstable and much worsened, will call the on-call neurosurgery team to discuss recommendations.  Patient reports he has had a brace from the previous fracture but does not have it here now and reports that that tended to worsen his back pain.  He does report that he has had some tingling in his toes but no weakness reported.  He denies other numbness.  He does say he occasionally has some urinary incontinence that is worsening as well.   12:04 PM Just spoke with Dr. Venetia Constable with neurosurgery who reviewed the images and story.  He is concerned that patient may need a more substantial surgery than Dr Estanislado Pandy was planning on doing.  He does not feel the patient needs to be n.p.o. as it would likely not happen today but he does want the patient admitted to [facility] under hospital service for the hypoxia, pneumonia, pain control, and so that they can see the patient to discuss further surgical management of the now unstable thoracic spine fracture.  He did  not feel the patient needs ED to ED transfer at this time but can be direct admitted to [facility].  We will call medicine for admission to [facility].    Clinical Impression: 1. Acute respiratory failure with hypoxia ([place])   2. Community acquired pneumonia, unspecified laterality   3. Pathological fracture of thoracic vertebra, initial encounter     Disposition: Admit  This note was prepared with assistance of Dragon voice recognition software. Occasional wrong-word or sound-a-like substitutions may have occurred due to the inherent limitations of voice recognition software.       Deontay Ladnier, Gwenyth Allegra, MD 12/10/21 [phone]

## 2021-12-10 NOTE — Discharge Planning (Signed)
Oncology Discharge Planning Admission Note  [facility] Address: [address], [place], [place] [address] Hours of Operation:  Carlos Erickson, Monday - Friday  Clinic Contact Information:  [phone]) [phone]  Oncology Care Team: Medical Oncologist:  Dr. Truitt Merle  Dr. Burr Medico and Myrtha Mantis, NP are aware of this hospital admission dated 12/10/21 and have assessed patient at the bedside.  The cancer center will follow Carlos Erickson inpatient care to assist with discharge planning as indicated by the oncologist.  We will arrange for follow up closer to discharge.  Disclaimer:  This [facility] note does not imply a formal consult request has been made by the admitting attending for this admission or there will be an inpatient consult completed by oncology.  Please request oncology consults as per standard process as indicated.

## 2021-12-10 NOTE — Sepsis Progress Note (Signed)
Monitoring for the code sepsis protocol. °

## 2021-12-10 NOTE — Progress Notes (Signed)
Pharmacist Chemotherapy Monitoring - Initial Assessment    Anticipated start date: 12/17/21   The following has been reviewed per standard work regarding the patient's treatment regimen: The patient's diagnosis, treatment plan and drug doses, and organ/hematologic function Lab orders and baseline tests specific to treatment regimen  The treatment plan start date, drug sequencing, and pre-medications Prior authorization status  Patient's documented medication list, including drug-drug interaction screen and prescriptions for anti-emetics and supportive care specific to the treatment regimen The drug concentrations, fluid compatibility, administration routes, and timing of the medications to be used The patient's access for treatment and lifetime cumulative dose history, if applicable  The patient's medication allergies and previous infusion related reactions, if applicable   Changes made to treatment plan:  N/A  Follow up needed:  Pending authorization for treatment  F/u home meds folic acid and antiemetic   Philomena Course, [place], 12/10/2021  2:48 PM

## 2021-12-10 NOTE — ED Notes (Signed)
RN made aware of vitals  

## 2021-12-11 ENCOUNTER — Other Ambulatory Visit (HOSPITAL_COMMUNITY): Payer: Medicare Other

## 2021-12-11 ENCOUNTER — Encounter (HOSPITAL_COMMUNITY): Payer: Self-pay | Admitting: Internal Medicine

## 2021-12-11 LAB — URINE CULTURE: Culture: NO GROWTH

## 2021-12-11 LAB — RENAL FUNCTION PANEL
Albumin: 2 g/dL — ABNORMAL LOW (ref 3.5–5.0)
Albumin: 2.1 g/dL — ABNORMAL LOW (ref 3.5–5.0)
Albumin: 2.2 g/dL — ABNORMAL LOW (ref 3.5–5.0)
Anion gap: 10 (ref 5–15)
Anion gap: 10 (ref 5–15)
Anion gap: 9 (ref 5–15)
BUN: 15 mg/dL (ref 8–23)
BUN: 14 mg/dL (ref 8–23)
BUN: 12 mg/dL (ref 8–23)
CO2: 22 mmol/L (ref 22–32)
CO2: 24 mmol/L (ref 22–32)
CO2: 24 mmol/L (ref 22–32)
Calcium: 8.6 mg/dL — ABNORMAL LOW (ref 8.9–10.3)
Calcium: 8.6 mg/dL — ABNORMAL LOW (ref 8.9–10.3)
Calcium: 8.7 mg/dL — ABNORMAL LOW (ref 8.9–10.3)
Chloride: 96 mmol/L — ABNORMAL LOW (ref 98–111)
Chloride: 95 mmol/L — ABNORMAL LOW (ref 98–111)
Chloride: 97 mmol/L — ABNORMAL LOW (ref 98–111)
Creatinine, Ser: 0.64 mg/dL (ref 0.61–1.24)
Creatinine, Ser: 0.6 mg/dL — ABNORMAL LOW (ref 0.61–1.24)
Creatinine, Ser: 0.57 mg/dL — ABNORMAL LOW (ref 0.61–1.24)
GFR, Estimated: >60 mL/min (ref >60)
GFR, Estimated: >60 mL/min (ref >60)
GFR, Estimated: >60 mL/min (ref >60)
Glucose, Bld: 110 mg/dL — ABNORMAL HIGH (ref 70–99)
Glucose, Bld: 128 mg/dL — ABNORMAL HIGH (ref 70–99)
Glucose, Bld: 126 mg/dL — ABNORMAL HIGH (ref 70–99)
Phosphorus: 5.2 mg/dL — ABNORMAL HIGH (ref 2.5–4.6)
Phosphorus: 4 mg/dL (ref 2.5–4.6)
Phosphorus: 4.1 mg/dL (ref 2.5–4.6)
Potassium: 3.4 mmol/L — ABNORMAL LOW (ref 3.5–5.1)
Potassium: 3.6 mmol/L (ref 3.5–5.1)
Potassium: 3.9 mmol/L (ref 3.5–5.1)
Sodium: 128 mmol/L — ABNORMAL LOW (ref 135–145)
Sodium: 129 mmol/L — ABNORMAL LOW (ref 135–145)
Sodium: 130 mmol/L — ABNORMAL LOW (ref 135–145)

## 2021-12-11 LAB — CBC
HCT: 29.3 % — ABNORMAL LOW (ref 39.0–52.0)
Hemoglobin: 9.9 g/dL — ABNORMAL LOW (ref 13.0–17.0)
MCH: 29.6 pg (ref 26.0–34.0)
MCHC: 33.8 g/dL (ref 30.0–36.0)
MCV: 87.5 fL (ref 80.0–100.0)
Platelets: 156 10*3/uL (ref 150–400)
RBC: 3.35 MIL/uL — ABNORMAL LOW (ref 4.22–5.81)
RDW: 13.3 % (ref 11.5–15.5)
WBC: 10.8 10*3/uL — ABNORMAL HIGH (ref 4.0–10.5)
nRBC: 0 % (ref 0.0–0.2)

## 2021-12-11 LAB — STREP PNEUMONIAE URINARY ANTIGEN: Strep Pneumo Urinary Antigen: NEGATIVE

## 2021-12-11 LAB — PROCALCITONIN: Procalcitonin: 0.13 ng/mL

## 2021-12-11 LAB — PROTIME-INR
INR: 1.2 (ref 0.8–1.2)
Prothrombin Time: 15.5 seconds — ABNORMAL HIGH (ref 11.4–15.2)

## 2021-12-11 LAB — MAGNESIUM
Magnesium: 1.5 mg/dL — ABNORMAL LOW (ref 1.7–2.4)
Magnesium: 1.4 mg/dL — ABNORMAL LOW (ref 1.7–2.4)

## 2021-12-11 LAB — CORTISOL-AM, BLOOD: Cortisol - AM: 16.9 ug/dL (ref 6.7–22.6)

## 2021-12-11 LAB — MRSA NEXT GEN BY PCR, NASAL: MRSA by PCR Next Gen: NOT DETECTED

## 2021-12-11 LAB — OSMOLALITY, URINE: Osmolality, Ur: 565 mOsm/kg (ref 300–900)

## 2021-12-11 MED ORDER — TRAZODONE HCL 50 MG PO TABS
50.0000 mg | ORAL_TABLET | Freq: Every day | ORAL | Status: DC
  Administered 2021-12-11: 50 mg via ORAL
  Filled 2021-12-11: qty 1

## 2021-12-11 MED ORDER — MAGNESIUM SULFATE 2 GM/50ML IV SOLN
2.0000 g | Freq: Once | INTRAVENOUS | Status: AC
  Administered 2021-12-11: 2 g via INTRAVENOUS
  Filled 2021-12-11: qty 50

## 2021-12-11 MED ORDER — ENSURE ENLIVE PO LIQD
237.0000 mL | Freq: Two times a day (BID) | ORAL | Status: DC
  Administered 2021-12-11 – 2021-12-12 (×3): 237 mL via ORAL

## 2021-12-11 NOTE — Plan of Care (Signed)
  Problem: Clinical Measurements: Goal: Ability to maintain clinical measurements within normal limits will improve Outcome: Progressing Goal: Diagnostic test results will improve Outcome: Progressing Goal: Respiratory complications will improve Outcome: Progressing Goal: Cardiovascular complication will be avoided Outcome: Progressing   

## 2021-12-11 NOTE — Progress Notes (Signed)
PROGRESS NOTE  Carlos Erickson EPP:[id number] DOB: [birth year] DOA: 12/10/2021 PCP: Owens Loffler, MD  HPI/Recap of past 24 hours: Carlos Erickson is a 71 y.o. male with medical history significant of NSCLC w/ bone mets, HTN, HLD, CAD s/p CABG. Presenting from home with AMS and dyspnea. History obtained from wife. She said she noticed that he was in a coughing spell this morning. He couldn't seem to catch his breath and he seemed frustrated. He seemed to be in distress, but also confused. She became concerned and called for EMS. She denies any other aggravating or alleviating factors.   Of note, the patient reports that he also had increased back pain over last night and his home meds were not helping.    Work-up revealed bibasilar pneumonia and bilateral pleural effusions right greater than left.  Also revealed metastatic disease worst at T11 with a burst fracture and some retropulsion and some new fracture lines across the pedicle bilaterally from NSCLC metastasis involving bones and liver.  12/11/2021: Patient was seen and examined at his bedside.  His son was present in the room.  Reports his breathing is about the same.  Assessment/Plan: Principal Problem:   PNA (pneumonia) Active Problems:   Hypertension   CAD (coronary artery disease), native coronary artery   Hyperlipidemia   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HCC)   Hyponatremia   Hypokalemia  Bibasilar PNA, POA Bilateral, right greater than left, pleural effusion Continue empiric IV antibiotics cefepime. IV vancomycin discontinued with negative MRSA screening test. Monitor fever curve and WBC.  Multiple pathologic fractures of the thoracic and lumbar spine Unstable T11 fracture     - neurosurgery onboard and requested transfer to [facility]; appreciate their assistance, will await final recs     -Continue pain management.   NSCLC w/ bone and liver mets     - imaging notes mets to liver and possibly to right kidney      - onco onboard, appreciate assistance     - will consult palliative care   HLD     - continue home regimen when confirmed   CAD     - continue home regimen when confirmed   HTN     - continue home regimen when confirmed   Refractory hyponatremia, suspect SIADH. Resolved post repletion: Hypokalemia Serum potassium 3.6 from 3.4.  Hypomagnesemia Magnesium 1.4 Repleted intravenously.  Physical debility Fall precautions.    Advance Care Planning:   Code Status: FULL   Consults: Oncology, Neurosurgery      Code Status: Full code.  Family Communication: Son at bedside.  Disposition Plan: Likely will discharge home with home health services.   Consultants: Medical oncology.  Procedures: None.  Antimicrobials: Cefepime IV vancomycin  DVT prophylaxis: SCDs.  Status is: Inpatient Patient requires at least 2 midnights for further evaluation and treatment of present condition.            Objective: Vitals:   12/11/21 0415 12/11/21 0822 12/11/21 0834 12/11/21 1154  BP:  131/81  127/79  Pulse: 95 (!) 110  99  Resp: 20 19  16   Temp:  98.1 F (36.7 C)    TempSrc:  Oral    SpO2: 96% 94% 92% 92%  Weight:      Height:        Intake/Output Summary (Last 24 hours) at 12/11/2021 1451 Last data filed at 12/11/2021 1413 Gross per 24 hour  Intake 1929.68 ml  Output 1350 ml  Net 579.68 ml  Filed Weights   12/10/21 0427  Weight: 76.7 kg    Exam:  General: 71 y.o. year-old male well developed well nourished in no acute distress.  Alert and oriented x3. Cardiovascular: Regular rate and rhythm with no rubs or gallops.  No thyromegaly or JVD noted.   Respiratory: [name] bases no wheezing noted.  Poor inspiratory effort.   Abdomen: Soft nontender nondistended with normal bowel sounds x4 quadrants. Musculoskeletal: Trace lower extremity edema lower extremities bilaterally. Skin: No ulcerative lesions noted or rashes, Psychiatry: Mood is appropriate for  condition and setting   Data Reviewed: CBC: Recent Labs  Lab 12/10/21 0438 12/11/21 0059  WBC 13.5* 10.8*  HGB 11.2* 9.9*  HCT 32.7* 29.3*  MCV 86.7 87.5  PLT 177 161   Basic Metabolic Panel: Recent Labs  Lab 12/10/21 0438 12/11/21 0059 12/11/21 0925  NA 127* 128* 129*  K 3.3* 3.4* 3.6  CL 96* 96* 95*  CO2 22 22 24   GLUCOSE 122* 110* 128*  BUN 19 15 14   CREATININE 0.59* 0.64 0.60*  CALCIUM 8.6* 8.6* 8.6*  MG  --  1.5* 1.4*  PHOS  --  5.2* 4.0   GFR: Estimated Creatinine Clearance: 81.9 mL/min (A) (by C-G formula based on SCr of 0.6 mg/dL (L)). Liver Function Tests: Recent Labs  Lab 12/10/21 0438 12/11/21 0059 12/11/21 0925  AST 23  --   --   ALT 25  --   --   ALKPHOS 141*  --   --   BILITOT 1.0  --   --   PROT 6.5  --   --   ALBUMIN 2.6* 2.0* 2.1*   No results for input(s): LIPASE, AMYLASE in the last 168 hours. No results for input(s): AMMONIA in the last 168 hours. Coagulation Profile: Recent Labs  Lab 12/10/21 0438 12/11/21 0059  INR 1.1 1.2   Cardiac Enzymes: No results for input(s): CKTOTAL, CKMB, CKMBINDEX, TROPONINI in the last 168 hours. BNP (last 3 results) No results for input(s): PROBNP in the last 8760 hours. HbA1C: No results for input(s): HGBA1C in the last 72 hours. CBG: Recent Labs  Lab 12/10/21 0438  GLUCAP 133*   Lipid Profile: No results for input(s): CHOL, HDL, LDLCALC, TRIG, CHOLHDL, LDLDIRECT in the last 72 hours. Thyroid Function Tests: No results for input(s): TSH, T4TOTAL, FREET4, T3FREE, THYROIDAB in the last 72 hours. Anemia Panel: No results for input(s): VITAMINB12, FOLATE, FERRITIN, TIBC, IRON, RETICCTPCT in the last 72 hours. Urine analysis:    Component Value Date/Time   COLORURINE YELLOW 12/10/2021 0438   APPEARANCEUR CLOUDY (A) 12/10/2021 0438   LABSPEC 1.012 12/10/2021 0438   PHURINE 7.0 12/10/2021 0438   GLUCOSEU NEGATIVE 12/10/2021 0438   HGBUR NEGATIVE 12/10/2021 0438   BILIRUBINUR NEGATIVE  12/10/2021 0438   KETONESUR 5 (A) 12/10/2021 0438   PROTEINUR NEGATIVE 12/10/2021 0438   NITRITE NEGATIVE 12/10/2021 0438   LEUKOCYTESUR NEGATIVE 12/10/2021 0438   Sepsis Labs: @LABRCNTIP (procalcitonin:4,lacticidven:4)  ) Recent Results (from the past 240 hour(s))  Resp Panel by RT-PCR (Flu A&B, Covid) Nasopharyngeal Swab     Status: None   Collection Time: 12/10/21  4:38 AM   Specimen: Nasopharyngeal Swab; Nasopharyngeal(NP) swabs in vial transport medium  Result Value Ref Range Status   SARS Coronavirus 2 by RT PCR NEGATIVE NEGATIVE Final    Comment: (NOTE) SARS-CoV-2 target nucleic acids are NOT DETECTED.  The SARS-CoV-2 RNA is generally detectable in upper respiratory specimens during the acute phase of infection. The lowest concentration of SARS-CoV-2  viral copies this assay can detect is 138 copies/mL. A negative result does not preclude SARS-Cov-2 infection and should not be used as the sole basis for treatment or other patient management decisions. A negative result may occur with  improper specimen collection/handling, submission of specimen other than nasopharyngeal swab, presence of viral mutation(s) within the areas targeted by this assay, and inadequate number of viral copies(<138 copies/mL). A negative result must be combined with clinical observations, patient history, and epidemiological information. The expected result is Negative.  Fact Sheet for Patients:  EntrepreneurPulse.com.au  Fact Sheet for Healthcare Providers:  IncredibleEmployment.be  This test is no t yet approved or cleared by the [place] FDA and  has been authorized for detection and/or diagnosis of SARS-CoV-2 by FDA under an Emergency Use Authorization (EUA). This EUA will remain  in effect (meaning this test can be used) for the duration of the COVID-19 declaration under Section 564(b)(1) of the Act, 21 U.S.C.section 360bbb-3(b)(1), unless the  authorization is terminated  or revoked sooner.       Influenza A by PCR NEGATIVE NEGATIVE Final   Influenza B by PCR NEGATIVE NEGATIVE Final    Comment: (NOTE) The Xpert Xpress SARS-CoV-2/FLU/RSV plus assay is intended as an aid in the diagnosis of influenza from Nasopharyngeal swab specimens and should not be used as a sole basis for treatment. Nasal washings and aspirates are unacceptable for Xpert Xpress SARS-CoV-2/FLU/RSV testing.  Fact Sheet for Patients: EntrepreneurPulse.com.au  Fact Sheet for Healthcare Providers: IncredibleEmployment.be  This test is not yet approved or cleared by the [place] FDA and has been authorized for detection and/or diagnosis of SARS-CoV-2 by FDA under an Emergency Use Authorization (EUA). This EUA will remain in effect (meaning this test can be used) for the duration of the COVID-19 declaration under Section 564(b)(1) of the Act, 21 U.S.C. section 360bbb-3(b)(1), unless the authorization is terminated or revoked.  Performed at [facility], [place] [address], [place], [place] [address]   Blood Culture (routine x 2)     Status: None (Preliminary result)   Collection Time: 12/10/21  4:38 AM   Specimen: BLOOD  Result Value Ref Range Status   Specimen Description   Final    BLOOD BLOOD LEFT FOREARM Performed at [place] [address] [place], [place] [address]    Special Requests   Final    BOTTLES DRAWN AEROBIC AND ANAEROBIC Blood Culture adequate volume Performed at [place] [address], [place], [place] [address]    Culture   Final    NO GROWTH 1 DAY Performed at [facility], [place] [address], [place], [place] [address]    Report Status PENDING  Incomplete  Urine Culture     Status: None   Collection Time: 12/10/21  4:38 AM   Specimen: In/Out Cath Urine  Result Value Ref Range Status   Specimen Description   Final     IN/OUT CATH URINE Performed at [place] [address], [place], [place] [address]    Special Requests   Final    NONE Performed at [facility], [place] [address] [place], [place] [address]    Culture   Final    NO GROWTH Performed at [facility], [place] [address] [place], [place] [address]    Report Status 12/11/2021 FINAL  Final  Blood Culture (routine x 2)     Status: None (Preliminary result)   Collection Time:  12/10/21  8:24 AM   Specimen: BLOOD RIGHT HAND  Result Value Ref Range Status   Specimen Description   Final    BLOOD RIGHT HAND Performed at [facility], [place] [address], [place], [place] [address]    Special Requests   Final    BOTTLES DRAWN AEROBIC AND ANAEROBIC Blood Culture results may not be optimal due to an excessive volume of blood received in culture bottles Performed at [place] [address] [place], [place] [address]    Culture   Final    NO GROWTH 1 DAY Performed at [facility], [place] [address], [place], [place] [address]    Report Status PENDING  Incomplete  MRSA Next Gen by PCR, Nasal     Status: None   Collection Time: 12/10/21  6:55 PM   Specimen: Nasal Mucosa; Nasal Swab  Result Value Ref Range Status   MRSA by PCR Next Gen NOT DETECTED NOT DETECTED Final    Comment: (NOTE) The GeneXpert MRSA Assay (FDA approved for NASAL specimens only), is one component of a comprehensive MRSA colonization surveillance program. It is not intended to diagnose MRSA infection nor to guide or monitor treatment for MRSA infections. Test performance is not FDA approved in patients less than 35 years old. Performed at [facility], [place] [address], [place], [place] [address]       Studies: No results found.  Scheduled Meds:  amLODipine  10 mg Oral Daily   aspirin EC  81 mg Oral Daily   atorvastatin  20 mg Oral Daily   carvedilol  6.25 mg Oral BID WC    feeding supplement  237 mL Oral BID BM   folic acid  1 mg Oral Daily   gabapentin  300 mg Oral TID   losartan  100 mg Oral Daily   morphine  15 mg Oral Q12H   pantoprazole  40 mg Oral Daily   polyethylene glycol  17 g Oral BID   potassium chloride  40 mEq Oral Daily   senna  2 tablet Oral QHS   tamsulosin  0.4 mg Oral QPC supper   traZODone  50 mg Oral QHS    Continuous Infusions:  sodium chloride 100 mL/hr at 12/11/21 1409   ceFEPime (MAXIPIME) IV 2 g (12/11/21 1421)     LOS: 1 day     Kayleen Memos, MD [facility] Pager [phone]  If 7PM-7AM, please contact night-coverage [email or web address] Password [email or web address] 12/11/2021, 2:51 PM

## 2021-12-12 ENCOUNTER — Inpatient Hospital Stay (HOSPITAL_COMMUNITY): Payer: Medicare Other

## 2021-12-12 DIAGNOSIS — R0609 Other forms of dyspnea: Secondary | ICD-10-CM

## 2021-12-12 DIAGNOSIS — Z7189 Other specified counseling: Secondary | ICD-10-CM

## 2021-12-12 DIAGNOSIS — Z515 Encounter for palliative care: Secondary | ICD-10-CM

## 2021-12-12 DIAGNOSIS — G893 Neoplasm related pain (acute) (chronic): Secondary | ICD-10-CM

## 2021-12-12 DIAGNOSIS — Z66 Do not resuscitate: Secondary | ICD-10-CM

## 2021-12-12 LAB — LACTATE DEHYDROGENASE, PLEURAL OR PERITONEAL FLUID: LD, Fluid: 503 U/L — ABNORMAL HIGH (ref 3–23)

## 2021-12-12 LAB — CBC
HCT: 33.1 % — ABNORMAL LOW (ref 39.0–52.0)
Hemoglobin: 11.1 g/dL — ABNORMAL LOW (ref 13.0–17.0)
MCH: 29.4 pg (ref 26.0–34.0)
MCHC: 33.5 g/dL (ref 30.0–36.0)
MCV: 87.8 fL (ref 80.0–100.0)
Platelets: 212 10*3/uL (ref 150–400)
RBC: 3.77 MIL/uL — ABNORMAL LOW (ref 4.22–5.81)
RDW: 13.4 % (ref 11.5–15.5)
WBC: 13.2 10*3/uL — ABNORMAL HIGH (ref 4.0–10.5)
nRBC: 0.2 % (ref 0.0–0.2)

## 2021-12-12 LAB — BODY FLUID CELL COUNT WITH DIFFERENTIAL
Eos, Fluid: 0 %
Lymphs, Fluid: 42 %
Monocyte-Macrophage-Serous Fluid: 36 % — ABNORMAL LOW (ref 50–90)
Neutrophil Count, Fluid: 22 % (ref 0–25)
Total Nucleated Cell Count, Fluid: 588 cu mm (ref 0–1000)

## 2021-12-12 LAB — ALBUMIN, PLEURAL OR PERITONEAL FLUID: Albumin, Fluid: <1.5 g/dL

## 2021-12-12 LAB — GLUCOSE, PLEURAL OR PERITONEAL FLUID: Glucose, Fluid: 96 mg/dL

## 2021-12-12 IMAGING — US US THORACENTESIS ASP PLEURAL SPACE W/IMG GUIDE
1 series · 5 of 5 positions shown · non-contrast
Comparison: none

INDICATION: Patient with history of non-small cell lung cancer with bone
metastasis and bilateral pleural effusions, right greater than left.
Request for diagnostic and therapeutic right thoracentesis.

[Series 1: us thoracentesis asp pleural space w/img guide · 5 of 5 slices shown]
[im 1/5]
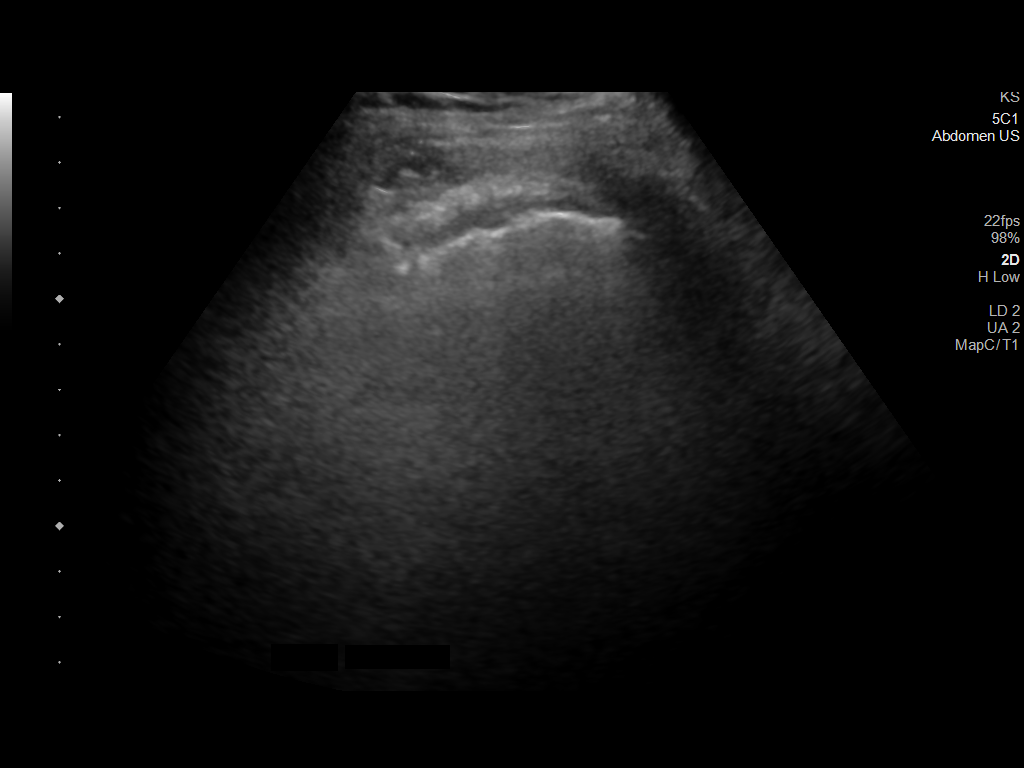
[im 2/5]
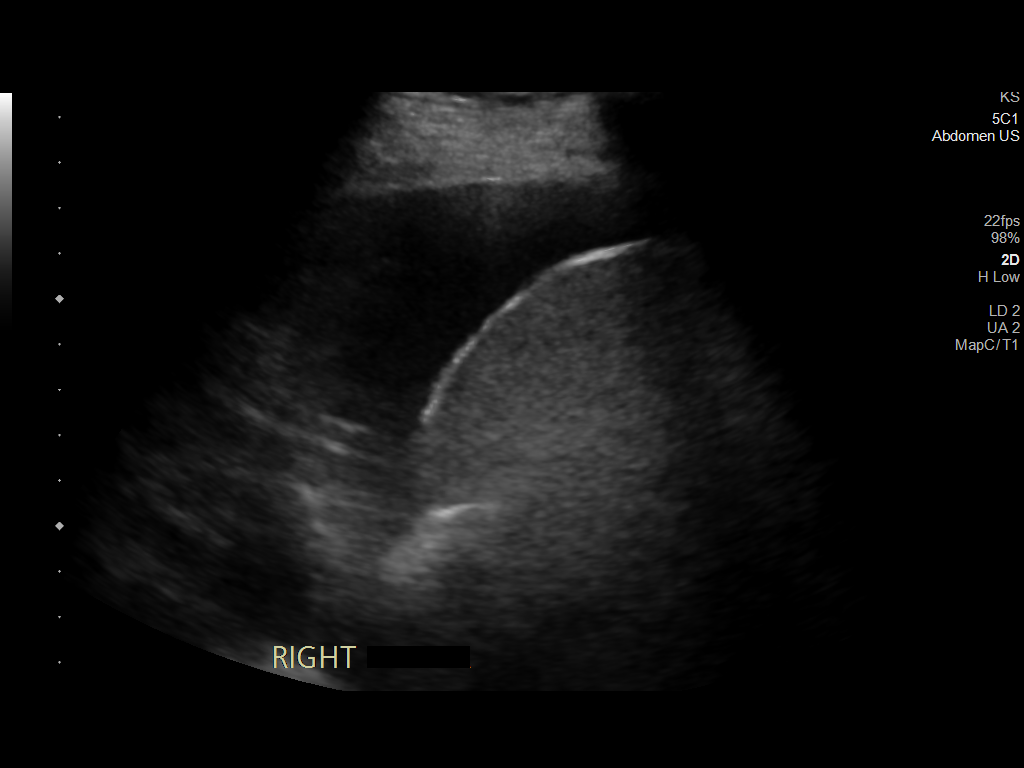
[im 3/5]
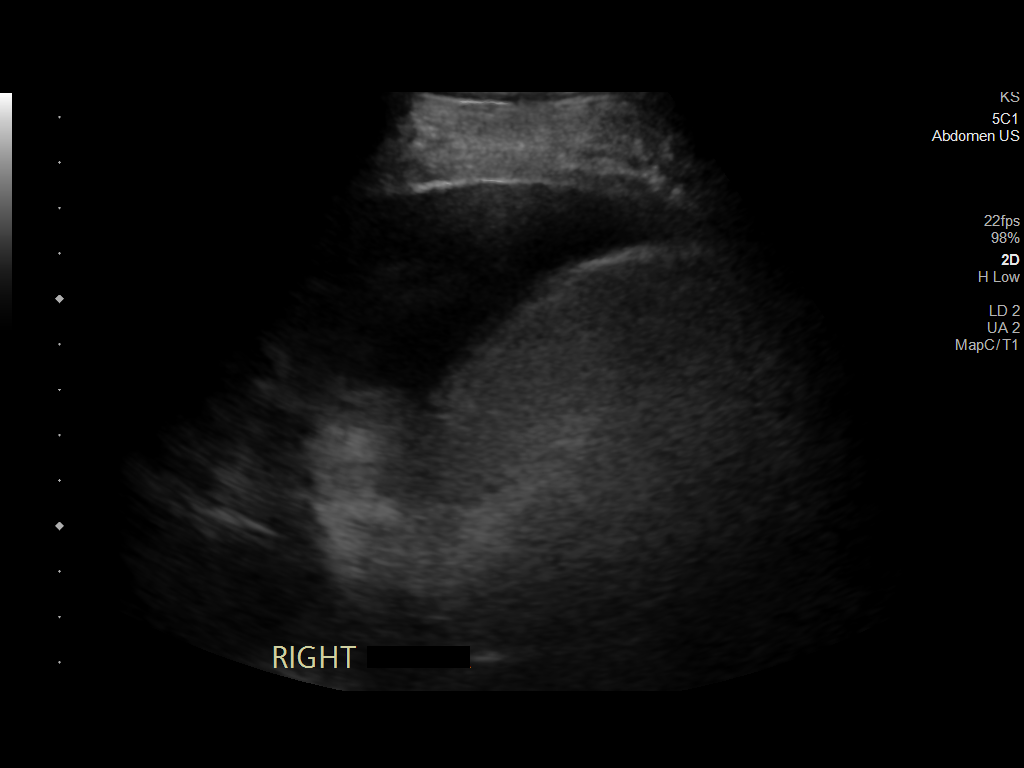
[im 4/5]
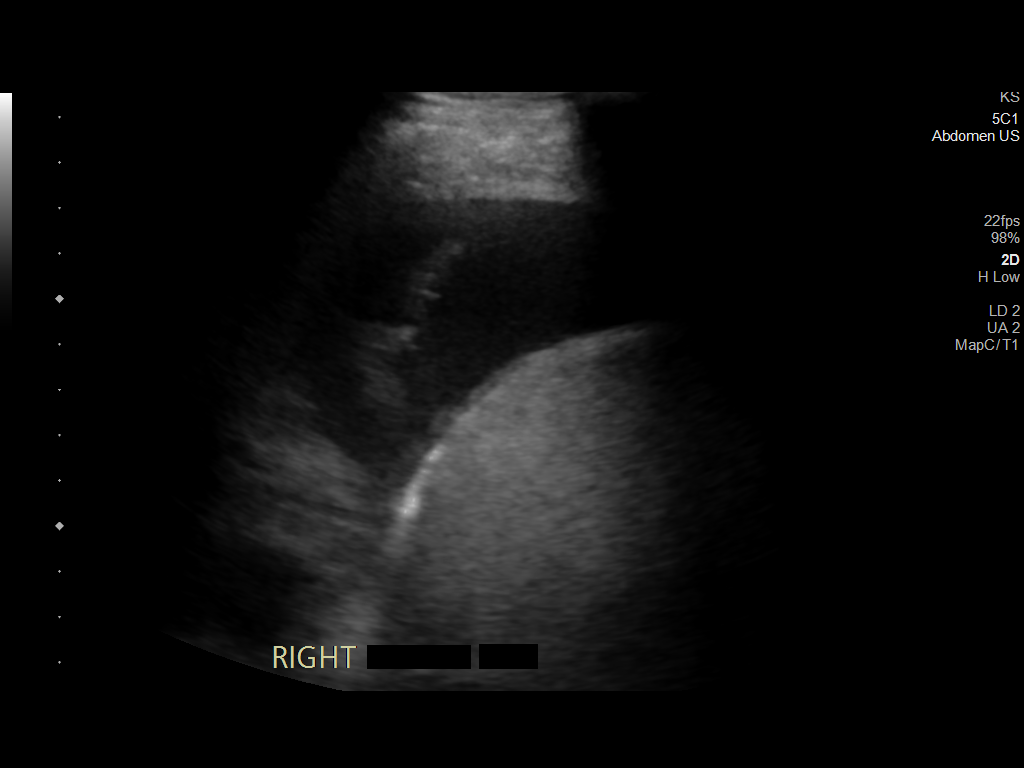
[im 5/5]
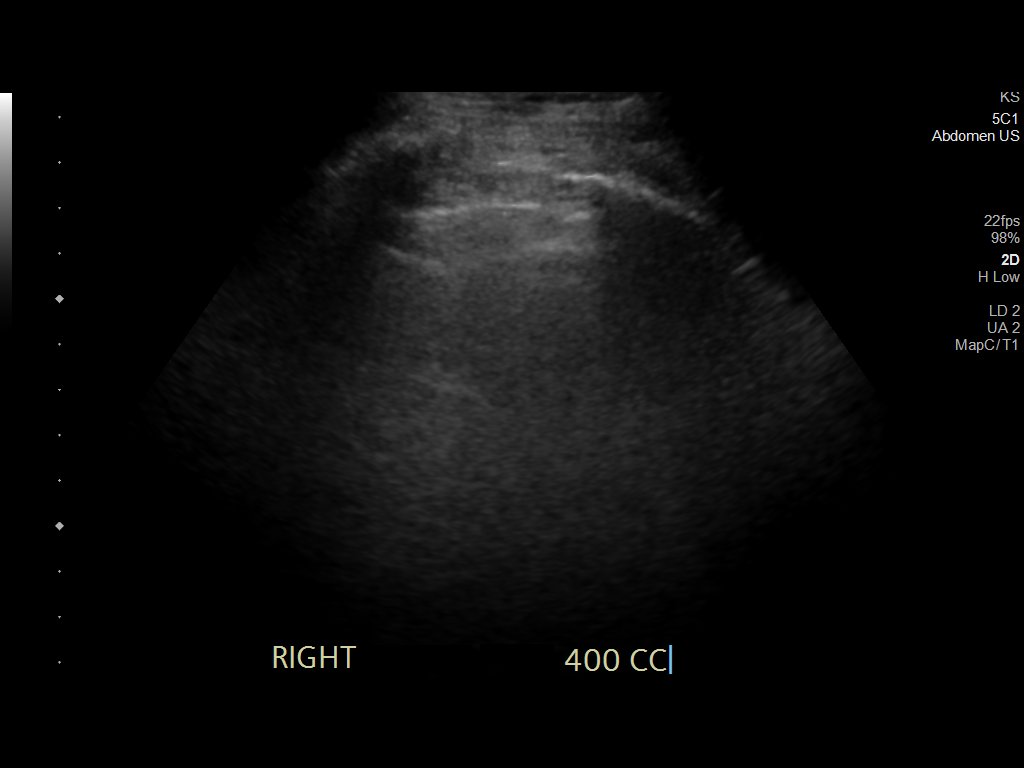

[5 of 5 positions shown; findings below may reference images not displayed]

EXAM:
ULTRASOUND GUIDED DIAGNOSTIC AND THERAPEUTIC RIGHT THORACENTESIS

MEDICATIONS:
10 mL 1 % lidocaine

COMPLICATIONS:
None immediate.

PROCEDURE:
An ultrasound guided thoracentesis was thoroughly discussed with the
patient and questions answered. The benefits, risks, alternatives
and complications were also discussed. The patient understands and
wishes to proceed with the procedure. Written consent was obtained.

Ultrasound was performed to localize and mark an adequate pocket of
fluid in the right chest. The area was then prepped and draped in
the normal sterile fashion. 1% Lidocaine was used for local
anesthesia. Under ultrasound guidance a 6 Fr Safe-T-Centesis
catheter was introduced. Thoracentesis was performed. The catheter
was removed and a dressing applied.
FINDINGS: A total of approximately 400 cc of serosanguineous fluid was
removed. Samples were sent to the laboratory as requested by the
clinical team.
IMPRESSION: Successful ultrasound guided right thoracentesis yielding 400 cc of
pleural fluid.

## 2021-12-12 IMAGING — DX DG CHEST 1V PORT
1 series · 1 of 1 positions shown · non-contrast
Comparison: [DATE]

CLINICAL DATA: Status post right thoracentesis.

EXAM:
PORTABLE CHEST 1 VIEW

[chest ap]
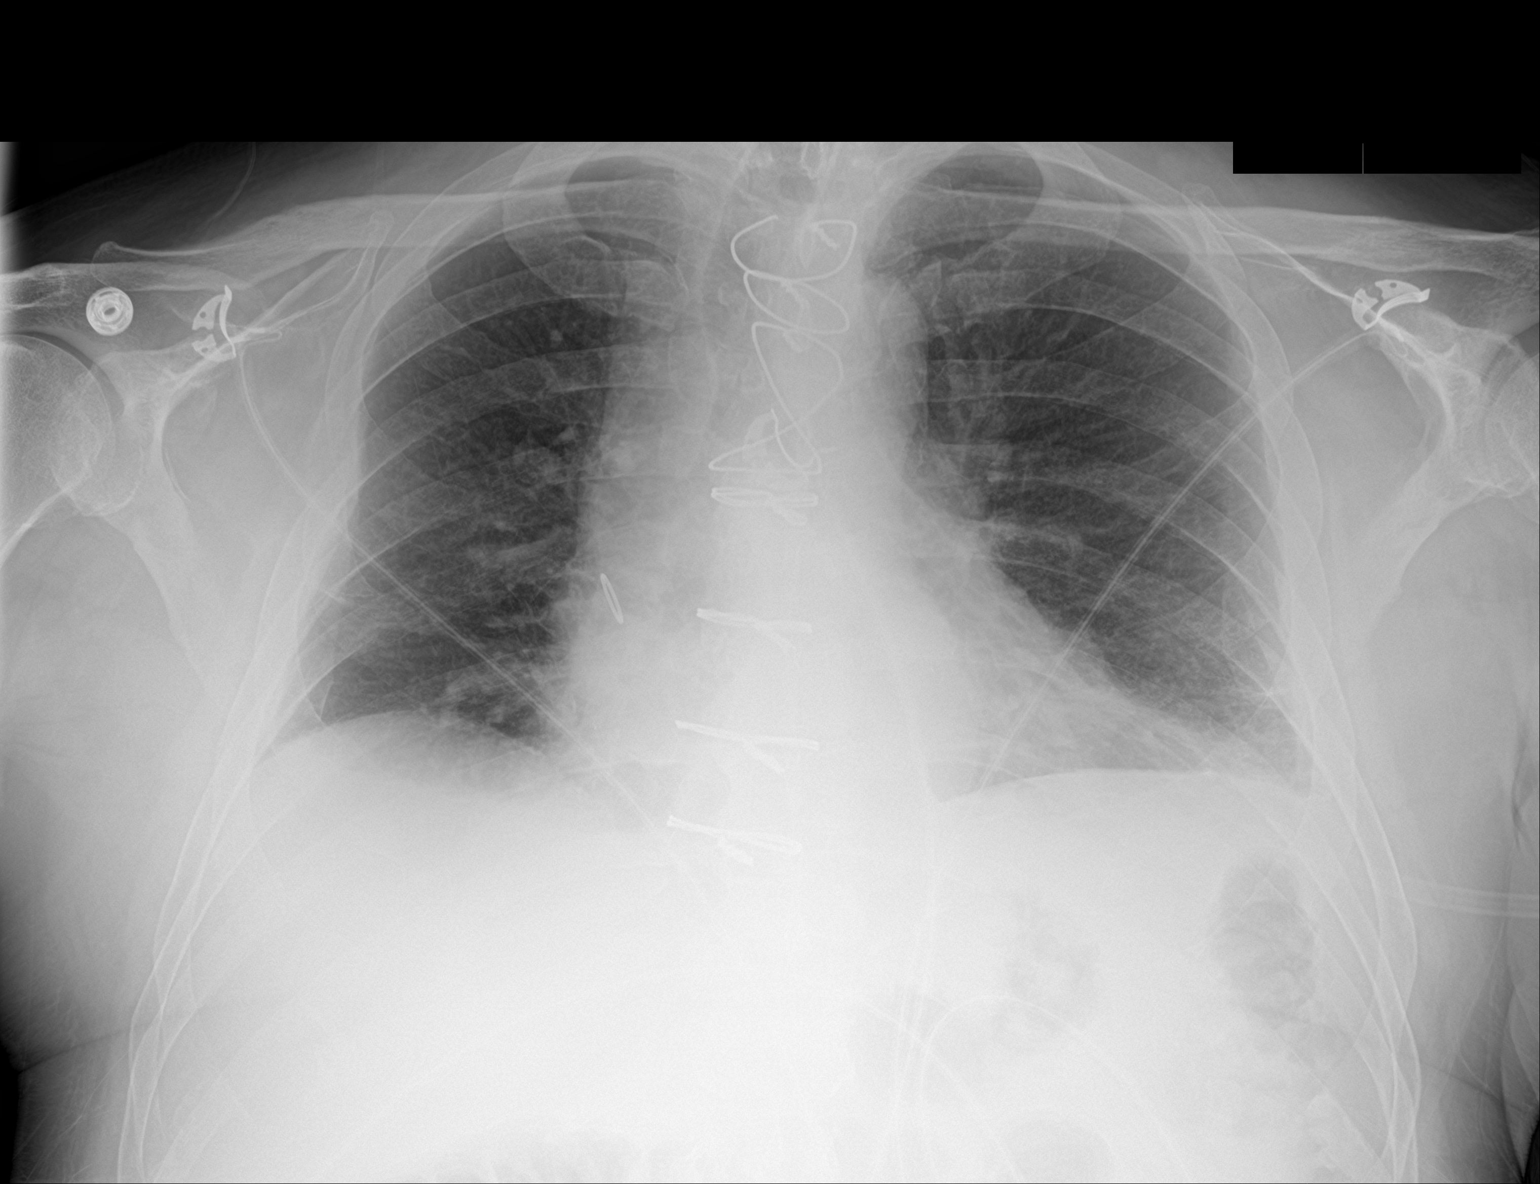

[1 of 1 positions shown; findings below may reference images not displayed]

FINDINGS: [BS] hours. The lungs are clear without focal pneumonia, edema,
pneumothorax or pleural effusion. Mild basilar atelectasis noted
bilaterally. Cardiopericardial silhouette is at upper limits of
normal for size. The visualized bony structures of the thorax show
no acute abnormality. Telemetry leads overlie the chest.
IMPRESSION: No pneumothorax after right thoracentesis.

## 2021-12-12 MED ORDER — CYCLOBENZAPRINE HCL 10 MG PO TABS: 5.0000 mg | ORAL_TABLET | Freq: Three times a day (TID) | ORAL | Status: DC | PRN

## 2021-12-12 MED ORDER — CALCITONIN (SALMON) 200 UNIT/ACT NA SOLN
1.0000 | Freq: Every day | NASAL | Status: DC
  Administered 2021-12-12 – 2021-12-18 (×7): 1 via NASAL
  Filled 2021-12-12: qty 3.7

## 2021-12-12 MED ORDER — ADULT MULTIVITAMIN W/MINERALS CH
1.0000 | ORAL_TABLET | Freq: Every day | ORAL | Status: DC
  Administered 2021-12-13 – 2021-12-14 (×3): 1 via ORAL
  Filled 2021-12-12 (×3): qty 1

## 2021-12-12 MED ORDER — ALPRAZOLAM 0.5 MG PO TABS
1.0000 mg | ORAL_TABLET | ORAL | Status: DC
  Filled 2021-12-12: qty 2

## 2021-12-12 MED ORDER — ONDANSETRON HCL 4 MG/2ML IJ SOLN
4.0000 mg | Freq: Four times a day (QID) | INTRAMUSCULAR | Status: DC | PRN
Start: 2021-12-12 — End: 2021-12-18

## 2021-12-12 MED ORDER — LORAZEPAM 2 MG/ML IJ SOLN: 1.0000 mg | Freq: Four times a day (QID) | INTRAMUSCULAR | Status: DC | PRN

## 2021-12-12 MED ORDER — LIDOCAINE 5 % EX PTCH
1.0000 | MEDICATED_PATCH | CUTANEOUS | Status: DC
  Administered 2021-12-13 – 2021-12-17 (×5): 1 via TRANSDERMAL
  Filled 2021-12-12 (×8): qty 1

## 2021-12-12 MED ORDER — ENSURE ENLIVE PO LIQD
237.0000 mL | Freq: Three times a day (TID) | ORAL | Status: DC
  Administered 2021-12-13 – 2021-12-18 (×14): 237 mL via ORAL

## 2021-12-12 MED ORDER — LIDOCAINE HCL (PF) 1 % IJ SOLN
INTRAMUSCULAR | Status: AC
  Filled 2021-12-12: qty 30

## 2021-12-12 MED ORDER — DEXAMETHASONE 2 MG PO TABS
2.0000 mg | ORAL_TABLET | Freq: Three times a day (TID) | ORAL | Status: AC
  Administered 2021-12-13 – 2021-12-14 (×7): 2 mg via ORAL
  Filled 2021-12-12 (×7): qty 1

## 2021-12-12 MED ORDER — IBUPROFEN 600 MG PO TABS
800.0000 mg | ORAL_TABLET | Freq: Three times a day (TID) | ORAL | Status: AC
  Administered 2021-12-12 – 2021-12-14 (×9): 800 mg via ORAL
  Filled 2021-12-12 (×9): qty 1

## 2021-12-12 MED ORDER — MELATONIN 3 MG PO TABS
3.0000 mg | ORAL_TABLET | Freq: Every day | ORAL | Status: DC
  Administered 2021-12-13 – 2021-12-17 (×6): 3 mg via ORAL
  Filled 2021-12-12 (×6): qty 1

## 2021-12-12 MED ORDER — CEFDINIR 300 MG PO CAPS
300.0000 mg | ORAL_CAPSULE | Freq: Two times a day (BID) | ORAL | Status: DC
  Administered 2021-12-13 – 2021-12-14 (×4): 300 mg via ORAL
  Filled 2021-12-12 (×5): qty 1

## 2021-12-12 MED ORDER — DEXAMETHASONE 4 MG PO TABS
4.0000 mg | ORAL_TABLET | Freq: Three times a day (TID) | ORAL | Status: DC
Start: 2021-12-12 — End: 2021-12-12
  Administered 2021-12-12: 4 mg via ORAL
  Filled 2021-12-12 (×4): qty 1

## 2021-12-12 MED ORDER — TRAZODONE HCL 50 MG PO TABS
50.0000 mg | ORAL_TABLET | Freq: Every day | ORAL | Status: DC
  Administered 2021-12-13 – 2021-12-17 (×6): 50 mg via ORAL
  Filled 2021-12-12 (×6): qty 1

## 2021-12-12 MED ORDER — LORAZEPAM 2 MG/ML IJ SOLN
2.0000 mg | INTRAMUSCULAR | Status: DC
Start: 2021-12-12 — End: 2021-12-12

## 2021-12-12 MED ORDER — HALOPERIDOL LACTATE 5 MG/ML IJ SOLN
1.0000 mg | INTRAMUSCULAR | Status: DC
Start: 2021-12-12 — End: 2021-12-12
  Filled 2021-12-12: qty 1

## 2021-12-12 MED ORDER — BIOTENE DRY MOUTH MT LIQD: 15.0000 mL | OROMUCOSAL | Status: DC | PRN

## 2021-12-12 MED ORDER — HALOPERIDOL LACTATE 5 MG/ML IJ SOLN
1.0000 mg | INTRAMUSCULAR | Status: AC
  Administered 2021-12-12: 1 mg via INTRAMUSCULAR

## 2021-12-12 MED ORDER — QUETIAPINE FUMARATE 25 MG PO TABS
12.5000 mg | ORAL_TABLET | Freq: Two times a day (BID) | ORAL | Status: DC
  Administered 2021-12-12 – 2021-12-18 (×13): 12.5 mg via ORAL
  Filled 2021-12-12 (×13): qty 1

## 2021-12-12 MED ORDER — HALOPERIDOL LACTATE 5 MG/ML IJ SOLN
1.0000 mg | INTRAMUSCULAR | Status: AC
  Administered 2021-12-12: 1 mg via INTRAMUSCULAR
  Filled 2021-12-12: qty 1

## 2021-12-12 NOTE — Progress Notes (Signed)
Met w/ pt and his family at bedside again, discussed surgical options. Discussed that it will depend on overall medical situation regarding his oxygenation / etc, improvement after tapping the effusion / response to Abx. If he's able to go to the OR, could perform percutaneous pedicle screw instrumentation of the fracture, which would not be fairly invasive and could provide enough pain control to allow him to return to ambulation. He would like to proceed, encouraged his family to discuss further, no rush to decide, need to see how his lungs do and discuss further before scheduling anything.

## 2021-12-12 NOTE — Progress Notes (Signed)
PROGRESS NOTE  Carlos Erickson WNU:[id number] DOB: [birth year] DOA: 12/10/2021 PCP: Owens Loffler, MD  HPI/Recap of past 24 hours: ILYAAS Erickson is a 71 y.o. male with medical history significant of NSCLC w/ bone mets, HTN, HLD, CAD s/p CABG. Presenting from home with AMS and dyspnea. History obtained from wife. She said she noticed that he was in a coughing spell this morning. He couldn't seem to catch his breath and he seemed frustrated. He seemed to be in distress, but also confused. She became concerned and called for EMS. She denies any other aggravating or alleviating factors.   Of note, the patient reports that he also had increased back pain over last night and his home meds were not helping.    Work-up revealed bibasilar pneumonia and bilateral pleural effusions right greater than left.  Also revealed metastatic disease worst at T11 with a burst fracture and some retropulsion and some new fracture lines across the pedicle bilaterally from NSCLC metastasis involving bones and liver.  Post right thoracentesis with 400 cc of serosanguineous fluid removed on 12/12/2021 by IR.  Fluid sent for analysis.  Hospital course complicated by agitation delirium.  Per patient's wife and son via phone on 12/12/2021, patient has not had any alcoholic drinks in more than a month.  12/12/2021: Patient seen and examined at his bedside.  He is alert and confused.  States back pain is worse with movement.  Pain management in place, lidocaine patch added.  Assessment/Plan: Principal Problem:   PNA (pneumonia) Active Problems:   Hypertension   CAD (coronary artery disease), native coronary artery   Hyperlipidemia   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HCC)   Hyponatremia   Hypokalemia  Bibasilar PNA, POA Bilateral, right greater than left, pleural effusion Continue empiric IV antibiotics cefepime. IV vancomycin discontinued with negative MRSA screening test. Monitor fever curve and WBC. IV  access lost with agitation on 12/12/2021.  Cefepime switched to cefdinir.  Acute metabolic encephalopathy/agitation delirium in the setting of acute illness Delirium precautions in place One-to-one sitter for patient's own safety Seroquel 12.5 mg twice daily, trazodone nightly Reorient frequently Regulate sleep and wake cycle, out of bed to chair during the day, open blinds during the day. IM Haldol 2 mg x 1 on 12/12/2021 due to agitation, pulling out his IV, to avoid patient's self-harm.  Multiple pathologic fractures of the thoracic and lumbar spine Unstable T11 fracture     - neurosurgery onboard and requested transfer to [facility]; appreciate their assistance, will await final recs     -Continue pain management. Added lidocaine patch daily.   NSCLC w/ bone and liver mets     - imaging notes mets to liver and possibly to right kidney     - onco onboard, appreciate assistance     -Palliative care team consulted.   HLD     -Continue home Lipitor   CAD     - continue home regimen when confirmed   HTN     -Continue home aspirin and home Lipitor   Resolved post repletion: Refractory hyponatremia, suspect SIADH. Potassium 3.9.  Hypomagnesemia Magnesium 1.4 Repleted intravenously. Repeat magnesium level in the morning.  Physical debility Continue fall precautions.  Goals of care DNR as of 12/12/2021. Palliative care team following.   Critical care time: 65 minutes.    Advance Care Planning:   Code Status: DNR.   Consults: Oncology, Neurosurgery, palliative care team.    Family Communication: Updated patient's wife in person at bedside.  Updated patient's son via phone.  Disposition Plan: Likely will discharge home with home health services once hemodynamically stable and delirium has improved..   Procedures: Right thoracentesis on 12/12/2021 by IR.  Antimicrobials: Cefepime> 12/12/2021. IV vancomycin> 12/11/2021. P.o. cefdinir 12/12/21.  DVT  prophylaxis: SCDs.  Status is: Inpatient Patient requires at least 2 midnights for further evaluation and treatment of present condition.            Objective: Vitals:   12/12/21 0936 12/12/21 1000 12/12/21 1029 12/12/21 1406  BP: (!) 148/97 (!) 152/96  121/76  Pulse:      Resp:      Temp:   98.2 F (36.8 C) 98 F (36.7 C)  TempSrc:   Oral Oral  SpO2:    94%  Weight:      Height:        Intake/Output Summary (Last 24 hours) at 12/12/2021 1603 Last data filed at 12/12/2021 1513 Gross per 24 hour  Intake 1168.33 ml  Output 1500 ml  Net -331.67 ml   Filed Weights   12/10/21 0427  Weight: 76.7 kg    Exam:  General: 71 y.o. year-old male frail-appearing in no acute distress.  He is alert and confused.   Cardiovascular: Regular rate and rhythm no rubs or gallops.   Respiratory: Clear to auscultation no wheezes or rales.   Abdomen: Soft nontender normal bowel sounds present.   Musculoskeletal: Trace lower extremity edema bilaterally.   Skin: No ulcerative lesions noted. Psychiatry: Alert and confused. Neuro: Nonfocal exam.  Moves all 4 extremities.   Data Reviewed: CBC: Recent Labs  Lab 12/10/21 0438 12/11/21 0059 12/12/21 0721  WBC 13.5* 10.8* 13.2*  HGB 11.2* 9.9* 11.1*  HCT 32.7* 29.3* 33.1*  MCV 86.7 87.5 87.8  PLT 177 156 941   Basic Metabolic Panel: Recent Labs  Lab 12/10/21 0438 12/11/21 0059 12/11/21 0925 12/11/21 1501  NA 127* 128* 129* 130*  K 3.3* 3.4* 3.6 3.9  CL 96* 96* 95* 97*  CO2 22 22 24 24   GLUCOSE 122* 110* 128* 126*  BUN 19 15 14 12   CREATININE 0.59* 0.64 0.60* 0.57*  CALCIUM 8.6* 8.6* 8.6* 8.7*  MG  --  1.5* 1.4*  --   PHOS  --  5.2* 4.0 4.1   GFR: Estimated Creatinine Clearance: 81.9 mL/min (A) (by C-G formula based on SCr of 0.57 mg/dL (L)). Liver Function Tests: Recent Labs  Lab 12/10/21 0438 12/11/21 0059 12/11/21 0925 12/11/21 1501  AST 23  --   --   --   ALT 25  --   --   --   ALKPHOS 141*  --   --   --    BILITOT 1.0  --   --   --   PROT 6.5  --   --   --   ALBUMIN 2.6* 2.0* 2.1* 2.2*   No results for input(s): LIPASE, AMYLASE in the last 168 hours. No results for input(s): AMMONIA in the last 168 hours. Coagulation Profile: Recent Labs  Lab 12/10/21 0438 12/11/21 0059  INR 1.1 1.2   Cardiac Enzymes: No results for input(s): CKTOTAL, CKMB, CKMBINDEX, TROPONINI in the last 168 hours. BNP (last 3 results) No results for input(s): PROBNP in the last 8760 hours. HbA1C: No results for input(s): HGBA1C in the last 72 hours. CBG: Recent Labs  Lab 12/10/21 0438  GLUCAP 133*   Lipid Profile: No results for input(s): CHOL, HDL, LDLCALC, TRIG, CHOLHDL, LDLDIRECT in the last 72 hours. Thyroid  Function Tests: No results for input(s): TSH, T4TOTAL, FREET4, T3FREE, THYROIDAB in the last 72 hours. Anemia Panel: No results for input(s): VITAMINB12, FOLATE, FERRITIN, TIBC, IRON, RETICCTPCT in the last 72 hours. Urine analysis:    Component Value Date/Time   COLORURINE YELLOW 12/10/2021 0438   APPEARANCEUR CLOUDY (A) 12/10/2021 0438   LABSPEC 1.012 12/10/2021 0438   PHURINE 7.0 12/10/2021 0438   GLUCOSEU NEGATIVE 12/10/2021 0438   HGBUR NEGATIVE 12/10/2021 0438   BILIRUBINUR NEGATIVE 12/10/2021 0438   KETONESUR 5 (A) 12/10/2021 0438   PROTEINUR NEGATIVE 12/10/2021 0438   NITRITE NEGATIVE 12/10/2021 0438   LEUKOCYTESUR NEGATIVE 12/10/2021 0438   Sepsis Labs: @LABRCNTIP (procalcitonin:4,lacticidven:4)  ) Recent Results (from the past 240 hour(s))  Resp Panel by RT-PCR (Flu A&B, Covid) Nasopharyngeal Swab     Status: None   Collection Time: 12/10/21  4:38 AM   Specimen: Nasopharyngeal Swab; Nasopharyngeal(NP) swabs in vial transport medium  Result Value Ref Range Status   SARS Coronavirus 2 by RT PCR NEGATIVE NEGATIVE Final    Comment: (NOTE) SARS-CoV-2 target nucleic acids are NOT DETECTED.  The SARS-CoV-2 RNA is generally detectable in upper respiratory specimens during the  acute phase of infection. The lowest concentration of SARS-CoV-2 viral copies this assay can detect is 138 copies/mL. A negative result does not preclude SARS-Cov-2 infection and should not be used as the sole basis for treatment or other patient management decisions. A negative result may occur with  improper specimen collection/handling, submission of specimen other than nasopharyngeal swab, presence of viral mutation(s) within the areas targeted by this assay, and inadequate number of viral copies(<138 copies/mL). A negative result must be combined with clinical observations, patient history, and epidemiological information. The expected result is Negative.  Fact Sheet for Patients:  EntrepreneurPulse.com.au  Fact Sheet for Healthcare Providers:  IncredibleEmployment.be  This test is no t yet approved or cleared by the [place] FDA and  has been authorized for detection and/or diagnosis of SARS-CoV-2 by FDA under an Emergency Use Authorization (EUA). This EUA will remain  in effect (meaning this test can be used) for the duration of the COVID-19 declaration under Section 564(b)(1) of the Act, 21 U.S.C.section 360bbb-3(b)(1), unless the authorization is terminated  or revoked sooner.       Influenza A by PCR NEGATIVE NEGATIVE Final   Influenza B by PCR NEGATIVE NEGATIVE Final    Comment: (NOTE) The Xpert Xpress SARS-CoV-2/FLU/RSV plus assay is intended as an aid in the diagnosis of influenza from Nasopharyngeal swab specimens and should not be used as a sole basis for treatment. Nasal washings and aspirates are unacceptable for Xpert Xpress SARS-CoV-2/FLU/RSV testing.  Fact Sheet for Patients: EntrepreneurPulse.com.au  Fact Sheet for Healthcare Providers: IncredibleEmployment.be  This test is not yet approved or cleared by the [place] FDA and has been authorized for detection and/or  diagnosis of SARS-CoV-2 by FDA under an Emergency Use Authorization (EUA). This EUA will remain in effect (meaning this test can be used) for the duration of the COVID-19 declaration under Section 564(b)(1) of the Act, 21 U.S.C. section 360bbb-3(b)(1), unless the authorization is terminated or revoked.  Performed at [facility], [place] [address] [place],  [address]   Blood Culture (routine x 2)     Status: None (Preliminary result)   Collection Time: 12/10/21  4:38 AM   Specimen: BLOOD  Result Value Ref Range Status   Specimen Description   Final    BLOOD BLOOD LEFT FOREARM Performed at [facility]  [facility], [place] [address] [place], [place] [address]    Special Requests   Final    BOTTLES DRAWN AEROBIC AND ANAEROBIC Blood Culture adequate volume Performed at [place] [address] [place], [place] [address]    Culture   Final    NO GROWTH 2 DAYS Performed at [place] [address], [place], [place] [address]    Report Status PENDING  Incomplete  Urine Culture     Status: None   Collection Time: 12/10/21  4:38 AM   Specimen: In/Out Cath Urine  Result Value Ref Range Status   Specimen Description   Final    IN/OUT CATH URINE Performed at [place] [address] [place], [place] [address]    Special Requests   Final    NONE Performed at [facility], [place] [address] [place], [place] [address]    Culture   Final    NO GROWTH Performed at [facility], [place] [address], [place], [place] [address]    Report Status 12/11/2021 FINAL  Final  Blood Culture (routine x 2)     Status: None (Preliminary result)   Collection Time: 12/10/21  8:24 AM   Specimen: BLOOD RIGHT HAND  Result Value Ref Range Status   Specimen Description   Final    BLOOD RIGHT HAND Performed at [place] [address], [place], [place] [address]    Special  Requests   Final    BOTTLES DRAWN AEROBIC AND ANAEROBIC Blood Culture results may not be optimal due to an excessive volume of blood received in culture bottles Performed at [place] [address] [place], [place] [address]    Culture   Final    NO GROWTH 2 DAYS Performed at [place] [address], [place], [place] [address]    Report Status PENDING  Incomplete  MRSA Next Gen by PCR, Nasal     Status: None   Collection Time: 12/10/21  6:55 PM   Specimen: Nasal Mucosa; Nasal Swab  Result Value Ref Range Status   MRSA by PCR Next Gen NOT DETECTED NOT DETECTED Final    Comment: (NOTE) The GeneXpert MRSA Assay (FDA approved for NASAL specimens only), is one component of a comprehensive MRSA colonization surveillance program. It is not intended to diagnose MRSA infection nor to guide or monitor treatment for MRSA infections. Test performance is not FDA approved in patients less than 34 years old. Performed at [facility], [place] [address], [place], [place] [address]       Studies: DG Chest Port 1 View  Result Date: 12/12/2021 CLINICAL DATA:  Status post right thoracentesis. EXAM: PORTABLE CHEST 1 VIEW COMPARISON:  12/10/2021 FINDINGS: 1016 hours. The lungs are clear without focal pneumonia, edema, pneumothorax or pleural effusion. Mild basilar atelectasis noted bilaterally. Cardiopericardial silhouette is at upper limits of normal for size. The visualized bony structures of the thorax show no acute abnormality. Telemetry leads overlie the chest. IMPRESSION: No pneumothorax after right thoracentesis. Electronically Signed   By: Misty Stanley M.D.   On: 12/12/2021 11:03   US THORACENTESIS ASP PLEURAL SPACE W/IMG GUIDE  Result Date: 12/12/2021 INDICATION: Patient with history of non-small cell lung cancer with bone metastasis and bilateral pleural effusions, right greater than left. Request for diagnostic and therapeutic right thoracentesis. EXAM:  ULTRASOUND GUIDED DIAGNOSTIC AND THERAPEUTIC RIGHT THORACENTESIS MEDICATIONS: 10 mL 1 % lidocaine  COMPLICATIONS: None immediate. PROCEDURE: An ultrasound guided thoracentesis was thoroughly discussed with the patient and questions answered. The benefits, risks, alternatives and complications were also discussed. The patient understands and wishes to proceed with the procedure. Written consent was obtained. Ultrasound was performed to localize and mark an adequate pocket of fluid in the right chest. The area was then prepped and draped in the normal sterile fashion. 1% Lidocaine was used for local anesthesia. Under ultrasound guidance a 6 Fr Safe-T-Centesis catheter was introduced. Thoracentesis was performed. The catheter was removed and a dressing applied. FINDINGS: A total of approximately 400 cc of serosanguineous fluid was removed. Samples were sent to the laboratory as requested by the clinical team. IMPRESSION: Successful ultrasound guided right thoracentesis yielding 400 cc of pleural fluid. Read by: Narda Rutherford, AGNP-BC Electronically Signed   By: Markus Daft M.D.   On: 12/12/2021 11:44    Scheduled Meds:  amLODipine  10 mg Oral Daily   aspirin EC  81 mg Oral Daily   atorvastatin  20 mg Oral Daily   calcitonin (salmon)  1 spray Alternating Nares Daily   carvedilol  6.25 mg Oral BID WC   cefdinir  300 mg Oral Q12H   dexamethasone  4 mg Oral TID   feeding supplement  237 mL Oral BID BM   folic acid  1 mg Oral Daily   gabapentin  300 mg Oral TID   haloperidol lactate  1 mg Intramuscular STAT   ibuprofen  800 mg Oral TID   losartan  100 mg Oral Daily   melatonin  3 mg Oral QHS   morphine  15 mg Oral Q12H   pantoprazole  40 mg Oral Daily   polyethylene glycol  17 g Oral BID   potassium chloride  40 mEq Oral Daily   QUEtiapine  12.5 mg Oral BID   senna  2 tablet Oral QHS   tamsulosin  0.4 mg Oral QPC supper    Continuous Infusions:     LOS: 2 days     Kayleen Memos, MD [facility] Pager [phone]  If 7PM-7AM, please contact night-coverage [email or web address] Password [facility] 12/12/2021, 4:03 PM

## 2021-12-12 NOTE — Consult Note (Signed)
Palliative Medicine Inpatient Consult Note  Consulting Provider: Jonnie Finner, DO  Reason for consult:   [facility] Consult  Reason for Consult? goals of care   HPI:  Per intake H&P --> Carlos Erickson is a 71 y.o. male with medical history significant of NSCLC w/ bone mets, HTN, HLD, CAD s/p CABG. Presenting from home with AMS and dyspnea. History obtained from wife. She said she noticed that he was in a coughing spell this morning. He couldn't seem to catch his breath and he seemed frustrated. He seemed to be in distress, but also confused. She became concerned and called for EMS. She denies any other aggravating or alleviating factors. Work-up revealed bibasilar pneumonia and bilateral pleural effusions right greater than left.  Also revealed metastatic disease worst at T11 with a burst fracture and some retropulsion and some new fracture lines across the pedicle bilaterally from NSCLC metastasis involving bones and liver.  Palliative care has been asked to get involved in the setting of metastatic non-small cell lung cancer to further address goals of care and to aid in symptom management.   Clinical Assessment/Goals of Care:  *Please note that this is a verbal dictation therefore any spelling or grammatical errors are due to the "[place] One" system interpretation.  I have reviewed medical records including EPIC notes, labs and imaging, received report from bedside RN, assessed the patient who is sitting up at the side of the bed fidgeting and moderate distress.    I met with Carlos Erickson and his spouse, Carlos Erickson this morning to further discuss diagnosis prognosis, GOC, EOL wishes, disposition and options.   I introduced Palliative Medicine as specialized medical care for people living with serious illness. It focuses on providing relief from the symptoms and stress of a serious illness. The goal is to improve quality of life for  both the patient and the family.  Medical History Review and Understanding:  Carlos Erickson and I reviewed his past medical history inclusive of coronary artery disease status post CABG, hypertension, hyperlipidemia.  We reviewed that since November around Thanksgiving more specifically he started to have more pain in his back.  He had felt it would go away though after 2 weeks the pain was noted to be quite bad and he went to his doctor where he got steroids.  Despite this he said the pain did not improve he went back to the emergency department around the week of Christmas when a CAT scan was done which revealed that he had vertebral masses further work-up revealed his non-small cell lung cancer.  He shares that he has gone through 6 radiation treatments none of which have helped him.  He expresses that he has been on opioids but they are not helpful either in terms of the severity of his back pain.  Social History:  Christepher is from [place], [place].  He has been married to his wife Carlos Erickson for the past 37 years.  They share 2 sons and 2 grandsons together.  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  Carlos Erickson gets great joy out of fishing and doing lawn work.  He loves  mowing the yard and being outdoors.  He is a man of faith and practices within the [facility] denomination  Functional and Nutritional State:  Carlos Erickson was extremely active and able to perform all B ADLs and IADLs prior to Thanksgiving 2022.  Since then he has gradually declined from a functional perspective whereby his wife now has to help him with bathing, dressing, preparing meals and transporting to and from the bathroom.   Carlos Erickson has 2  walkers at home and spends much of his day in the recliner chair.  He endorses he can no longer sleep in his bed due to discomfort.  [place]'s nutritional intake has been very poor over the past few months in the setting of radiation and he complains of having a dry mouth.  Palliative Symptoms:  Pain - Notable with movement throughout his back.  Patient shares he can never quite get comfortable.  He is taking extended release morphine which from his perception does not really help.  He says at home he is taken ibuprofen with acetaminophen in the past which has caused some relief of his pain.  Dyspnea - Notable with movement.  Carlos Erickson is not on oxygen at home at this point in time and shares that ideally he would not want to be though he understands the progression of his disease may require him to be on oxygen at some point.  Insomnia - Consistent throughout the night in the setting of discomfort.  Anxiety - Incremental and occurrence exacerbated by above symptoms.  Nausea - Rarely feels nauseous.  Failure to Thrive -since getting radiation patient shares that he has ongoing mouth dryness and he overall just does not have much of an appetite.  He smells the food and has no desire to eat it.  Advance Directives: A detailed discussion was had today regarding advanced directives.  Patient's primary decision-maker is his wife Carlos Erickson.  He has no advanced directives on file at this time.  Code Status: Concepts specific to code status, artifical feeding and hydration, continued IV antibiotics and rehospitalization was had.   Encouraged Carlos Erickson to consider DNR/DNI status understanding evidenced based poor outcomes in similar hospitalized patient, as the cause of arrest is likely associated with advanced chronic/terminal illness rather than an easily reversible acute cardio-pulmonary event. I explained that DNR/DNI does not change the medical plan and it only comes into effect after a person has  arrested (died).  It is a protective measure to keep [place] from harming the patient in their last moments of life. Carlos Erickson was agreeable to DNR/DNI with understanding that patient would not receive CPR, defibrillation, ACLS medications, or intubation.   A MOST form was completed with the below wishes:  Cardiopulmonary Resuscitation: Do Not Attempt Resuscitation (DNR/No CPR)  Medical Interventions: Limited Additional Interventions: Use medical treatment, IV fluids and cardiac monitoring as indicated, DO NOT USE intubation or mechanical ventilation. May consider use of less invasive airway support such as BiPAP or CPAP. Also provide comfort measures. Transfer to the hospital if indicated. Avoid intensive care.   Antibiotics: Determine use of limitation of antibiotics when infection occurs  IV Fluids: IV fluids for a defined trial period  Feeding Tube: No feeding tube   Goals for the Future:  Patient shares his ideal goal would be to have his pain under control so he could go fishing again.  He expresses his greatest joy is that of when he gets to fish and work outside in his yard.  Discussion:  I had a very  candid conversation with Carlos Erickson and Carlos Erickson about the future of his disease.  Carlos Erickson expresses that she understands his cancer is spreading "rapidly" and asked what the best solution to this is.  We reviewed even doing things such as additional radiation and chemotherapy may help some of his symptom burdens but his disease will likely continue to spread.    I shared that we can either continue with these aggressive measures or change her focus to more of a symptom management approach and comfort oriented care.  I did introduce the topic of hospice. I described hospice as a service for patients who have a life expectancy of 6 months or less. The goal of hospice is the preservation of dignity and quality at the end phases of life. Under hospice care, the focus changes from curative to symptom relief.    Patient's wife shares that she has some additional questions for the neurosurgery team to better understand if they will or will not do additional intervention.  Carlos Erickson expresses that he is leaning more towards a comfort focused approach though he is going to take some time to think about this and talk more to his family.  Discussed the importance of continued conversation with family and their  medical providers regarding overall plan of care and treatment options, ensuring decisions are within the context of the patients values and GOCs.  Decision Maker: Carlos Erickson (spouse) -[phone]  SUMMARY OF RECOMMENDATIONS   DNAR/DNI  MOST Completed, paper copy placed onto the chart electric copy can be found in [facility]  DNR Form Completed, paper copy placed onto the chart electric copy can be found in [facility]  Appreciate Dr. Zada Finders speaking to patient's wife regarding the plan from a neurosurgical perspective  Chaplain support requested  Symptom management as below  As of presently patient is considering if he would want additional chemotherapy or radiation or if he would want to take a more hospice oriented approach to care  Goals are to have pain so well controlled that patient can go fishing again  Ongoing palliative support  Code Status/Advance Care Planning: DNAR/DNI   Symptom Management:  Pain due to [place] with metastasis to the bones: - Continue MS Contin 15 mg twice daily - Continue hydromorphone 2 mg every 6 hours as needed - Continue gabapentin 300 mg 3 times daily - Start ibuprofen 800 mg 3 times daily for trial of 3 days - Start dexamethasone 4 mg 3 times daily for trial of 3 days - Cyclobenzaprine 5 mg 3 times daily as needed muscle spasms - Start calcitonin nasal spray 1 Nare alternating daily for 2 weeks - Appreciate Oncology team's consideration of Prolia/Xgeva  Failure to Thrive: - Appreciate nutritional involvement and management - Patient endorses that  he drinks ensures at home  Xerostomia in the setting of radiation treatment: -Biotene twice daily  Insomnia: - Melatonin 3 mg nightly at bedtime  Dyspnea: - Supplemental O2 - S/P thoracentesis (2/19) (-)453m - Ongoing management per primary team - Dilaudid as above to also alleviate these symptoms  Anxiety: - Incremental in occurrence will initiate low-dose clonazepam if patient endorses continuation  Constipation: - Continue MiraLAX twice daily - Continue senna 2 tabs at bedtime - LMB (2/19 - large)  Palliative Prophylaxis:  Aspiration, Bowel Regimen, Delirium Protocol, Frequent Pain Assessment, Oral Care, Palliative Wound Care, and Turn Reposition  Additional Recommendations (Limitations, Scope, Preferences): Treat what is treatable  Psycho-social/Spiritual:  Desire for further Chaplaincy support: Yes Additional Recommendations: Education on metastatic disease  Prognosis: Worrisome prognosis in the setting of patient's declining functional state.  Metastases appears worsened.  If patient does not opt for additional treatment I suspect this time will be 6 months or less.  Discharge Planning: Discharge to home when medically optimized.  Vitals:   12/12/21 0700 12/12/21 0859  BP:  117/86  Pulse: (!) 110   Resp: 20   Temp: 98.3 F (36.8 C)   SpO2: 95%     Intake/Output Summary (Last 24 hours) at 12/12/2021 1012 Last data filed at 12/12/2021 0100 Gross per 24 hour  Intake 1679.28 ml  Output 1550 ml  Net 129.28 ml   Last Weight  Most recent update: 12/10/2021  4:27 AM    Weight  76.7 kg (169 lb)            Gen: Elderly Caucasian male in no acute distress HEENT: Dry mucous membranes CV: Irregular rate and  rhythm PULM: On 6 L/min nasal cannula incrementally fatigued with movement ABD: soft/nontender EXT: No edema Neuro: Alert and oriented x3  PPS: 50%   This conversation/these recommendations were discussed with patient primary care team, Dr.  [place] Crane  Total Time: 135  MDM High  Medical Decision Making:4 #/Complex Problems: 4                     Data Reviewed:4                 Management:4 (1-Straightforward, 2-Low, 3-Moderate, 4-High) ______________________________________________________ [place] Team Team Cell Phone: [phone] Please utilize secure chat with additional questions, if there is no response within 30 minutes please call the above phone number  Palliative Medicine Team providers are available by phone from 7am to 7pm daily and can be reached through the team cell phone.  Should this patient require assistance outside of these hours, please call the patient's attending physician.

## 2021-12-12 NOTE — Progress Notes (Signed)
°  Transition of Care [facility]) Screening Note   Patient Details  Name: Carlos Erickson Date of Birth: 1950-12-15   Transition of Care [facility]) CM/SW Contact:    Bary Castilla, LCSW Phone Number: 12/12/2021, 3:26 PM    Transition of Care Department [facility]) has reviewed patient and no TOC needs have been identified at this time. We will continue to monitor patient advancement through interdisciplinary progression rounds. If new patient transition needs arise, please place a TOC consult.

## 2021-12-12 NOTE — Progress Notes (Signed)
°   12/12/21 0700  Assess: MEWS Score  Temp 98.3 F (36.8 C)  Pulse Rate (!) 110  ECG Heart Rate (!) 111  Resp 20  Level of Consciousness Alert  SpO2 95 %  O2 Device Nasal Cannula  O2 Flow Rate (L/min) 6 L/min  Assess: MEWS Score  MEWS Temp 0  MEWS Systolic 0  MEWS Pulse 2  MEWS RR 0  MEWS LOC 0  MEWS Score 2  MEWS Score Color Yellow  Assess: if the MEWS score is Yellow or Red  Were vital signs taken at a resting state? Yes  Focused Assessment No change from prior assessment  Early Detection of Sepsis Score *See Row Information* Low  MEWS guidelines implemented *See Row Information* No, previously yellow, continue vital signs every 4 hours  Take Vital Signs  Increase Vital Sign Frequency  Yellow: Q 2hr X 2 then Q 4hr X 2, if remains yellow, continue Q 4hrs  Escalate  MEWS: Escalate Yellow: discuss with charge nurse/RN and consider discussing with provider and RRT  Document  Patient Outcome Stabilized after interventions  Progress note created (see row info) Yes

## 2021-12-12 NOTE — Progress Notes (Signed)
Pt awoke confused, removing his nasal cannula, and trying to go to the bathroom.  DOE noted, and sats dropped to 72% on room air.  Pt reoriented and reminded that he has an external catheter.  Encouraged to stay in bed to prevent falling and becoming hypoxic.  He insisted on standing to urinate, and this RN assisted him to stand at bedside after replacing his nasal cannula.  Pt voided in his external catheter and was assisted back to bed.  Bed alarm on, wife at bedside.  Will continue to monitor.  Jodell Cipro

## 2021-12-12 NOTE — Procedures (Signed)
PROCEDURE SUMMARY:  Successful US guided diagnostic and therapeutic right thoracentesis. Yielded 400 cc serosanguineous fluid. Pt tolerated procedure well. No immediate complications.  Specimen was sent for labs. CXR ordered.  EBL < 1 mL  Tyson Alias, AGNP 12/12/2021 10:10 AM

## 2021-12-12 NOTE — Plan of Care (Signed)

## 2021-12-12 NOTE — Progress Notes (Signed)
Initial Nutrition Assessment  DOCUMENTATION CODES:   Not applicable  INTERVENTION:   Recommend liberalizing pt diet to regular due to increased need of cancer with bone mets. Messaged MD Encourage good PO intake Multivitamin w/ minerals daily Ensure Enlive po TID, each supplement provides 350 kcal and 20 grams of protein.  NUTRITION DIAGNOSIS:   Increased nutrient needs related to cancer and cancer related treatments as evidenced by estimated needs.  GOAL:   Patient will meet greater than or equal to 90% of their needs  MONITOR:   PO intake, Weight trends, Supplement acceptance, Labs  REASON FOR ASSESSMENT:   Consult Assessment of nutrition requirement/status  ASSESSMENT:   71 y.o. male presented to the ED with AMS and dyspnea. PMH includes HTN, CABG, GERD, NSCLC w/ bone mets. Pt admitted with pneumonia and unstable fracture of T11.   2/19 - thoracentesis - 400 cc removed  Pt with ongoing AMS and agitation. Reached out to RN's and NT's to assess pt eating. Per RN, pt has ate very poor but drinking Ensure. RN reports that family stated pt's appetite at home has been very poor as well.   No meal intakes recorded within EMR.   Suspect that current weight in chart is stated versus actual.   Per palliative note, pt does not wish to have any artifical feedings/feeding tubes.   Medications reviewed and include: Omnicef, Decadron, Folic Acid, Protonix, Miralax, Potassium Chloride, Senokot Labs reviewed:  - Sodium 130 - Magnesium 1.4   NUTRITION - FOCUSED PHYSICAL EXAM:  Deferred to follow-up.   Diet Order:   Diet Order             Diet heart healthy/carb modified Room service appropriate? Yes; Fluid consistency: Thin  Diet effective now                   EDUCATION NEEDS:   No education needs have been identified at this time  Skin:  Skin Assessment: Reviewed RN Assessment  Last BM:  2/19  Height:   Ht Readings from Last 1 Encounters:  12/10/21 5'  8" (1.727 m)    Weight:   Wt Readings from Last 1 Encounters:  12/10/21 76.7 kg    Ideal Body Weight:  70 kg  BMI:  Body mass index is 25.7 kg/m.  Estimated Nutritional Needs:   Kcal:  4287-6811  Protein:  115-130 grams  Fluid:  >/= 2.3 L    Dyneisha Murchison Louie Casa, RD, LDN Clinical Dietitian See [facility] for contact information.
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
## 2021-12-13 DIAGNOSIS — J189 Pneumonia, unspecified organism: Secondary | ICD-10-CM | POA: Diagnosis not present

## 2021-12-13 DIAGNOSIS — C3490 Malignant neoplasm of unspecified part of unspecified bronchus or lung: Secondary | ICD-10-CM | POA: Diagnosis not present

## 2021-12-13 DIAGNOSIS — C7951 Secondary malignant neoplasm of bone: Secondary | ICD-10-CM | POA: Diagnosis not present

## 2021-12-13 LAB — BASIC METABOLIC PANEL
Anion gap: 10 (ref 5–15)
BUN: 18 mg/dL (ref 8–23)
CO2: 30 mmol/L (ref 22–32)
Calcium: 9.2 mg/dL (ref 8.9–10.3)
Chloride: 98 mmol/L (ref 98–111)
Creatinine, Ser: 0.86 mg/dL (ref 0.61–1.24)
GFR, Estimated: >60 mL/min (ref >60)
Glucose, Bld: 151 mg/dL — ABNORMAL HIGH (ref 70–99)
Potassium: 4.7 mmol/L (ref 3.5–5.1)
Sodium: 138 mmol/L (ref 135–145)

## 2021-12-13 LAB — MAGNESIUM: Magnesium: 2.3 mg/dL (ref 1.7–2.4)

## 2021-12-13 LAB — CBC
HCT: 32.2 % — ABNORMAL LOW (ref 39.0–52.0)
Hemoglobin: 10.7 g/dL — ABNORMAL LOW (ref 13.0–17.0)
MCH: 29.9 pg (ref 26.0–34.0)
MCHC: 33.2 g/dL (ref 30.0–36.0)
MCV: 89.9 fL (ref 80.0–100.0)
Platelets: 226 10*3/uL (ref 150–400)
RBC: 3.58 MIL/uL — ABNORMAL LOW (ref 4.22–5.81)
RDW: 13.4 % (ref 11.5–15.5)
WBC: 10.5 10*3/uL (ref 4.0–10.5)
nRBC: 0 % (ref 0.0–0.2)

## 2021-12-13 LAB — LEGIONELLA PNEUMOPHILA SEROGP 1 UR AG: L. pneumophila Serogp 1 Ur Ag: NEGATIVE

## 2021-12-13 LAB — PHOSPHORUS: Phosphorus: 4.3 mg/dL (ref 2.5–4.6)

## 2021-12-13 NOTE — Consult Note (Signed)
° °  [facility] Inpatient Consult   12/13/2021  Carlos Erickson [birth year] [id number]   [facility] [ACO] Patient: [name]  Primary Care Provider:  Owens Loffler, MD, is an embedded provider with a Chronic Care Management team and program, and is listed for the transition of care follow up and appointments.  Patient was screened for Embedded practice service needs for chronic care management.   Plan: Continue to follow for [facility] needs for post hospital.  Make referrals if appropriate for disposition needs.  Please contact for further questions,  Natividad Brood, RN BSN [facility] Liaison  [phone] business mobile phone Toll free office [phone]  Fax number: [phone] [email or web address]

## 2021-12-13 NOTE — Progress Notes (Signed)
PROGRESS NOTE  Carlos Erickson ERD:[id number] DOB: [birth year] DOA: 12/10/2021 PCP: Owens Loffler, MD  HPI/Recap of past 24 hours: Carlos Erickson is a 71 y.o. male with medical history significant of NSCLC w/ bone mets, HTN, HLD, CAD s/p CABG. Presenting from home with AMS, dyspnea, and mid back pain. Work-up revealed bibasilar pneumonia, bilateral pleural effusions right greater than left, metastatic disease worst at T11 with a burst fracture and some retropulsion and some new fracture lines across the pedicle bilaterally from NSCLC metastasis involving bones and liver.  Post right thoracentesis with 400 cc of serosanguineous fluid removed on 12/12/2021 by IR.  Fluid sent for analysis.  Hospital course complicated by agitation delirium 2/19, started on delirium precautions and management.  No recent use of alcohol, last alcohol intake was more than a month ago per his wife and son.    12/13/2021: Patient was seen and examined at bedside.  He is alert calm and oriented x3.  Endorses back pain when he moves.  Seen by palliative care team, plan to discharge to home with hospice care after his back surgery, per palliative medicine team provider.  Assessment/Plan: Principal Problem:   PNA (pneumonia) Active Problems:   Hypertension   CAD (coronary artery disease), native coronary artery   Hyperlipidemia   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HCC)   Hyponatremia   Hypokalemia  Bibasilar PNA, POA Bilateral, right greater than left, pleural effusion Continue empiric IV antibiotics cefepime. IV vancomycin discontinued with negative MRSA screening test. Monitor fever curve and WBC. IV access lost with agitation on 12/12/2021, cefepime switched to cefdinir.  Acute metabolic encephalopathy/agitation delirium in the setting of acute illness, improving Delirium precautions in place One-to-one sitter for patient's own safety Continue Seroquel 12.5 mg twice daily, trazodone nightly Continue  to reorient frequently Continue regulate sleep and wake cycle, out of bed to chair during the day, open blinds during the day. Avoid benzodiazepines  Multiple pathologic fractures of the thoracic and lumbar spine Unstable T11 fracture     - neurosurgery onboard and requested transfer to [facility]; appreciate their assistance, will await final recs     -Continue pain management.  Decadron added on 12/12/2021 for pain by palliative, continue. Continue lidocaine patch daily.   NSCLC w/ bone and liver mets     - imaging notes mets to liver and possibly to right kidney     - onco onboard, appreciate assistance   HLD     -Continue home Lipitor   CAD     - continue home regimen when confirmed   HTN     -Continue home aspirin and home Lipitor   Resolved post repletion: Refractory hyponatremia, suspect SIADH. Potassium 3.9.  Resolved post repletion: Hypomagnesemia Magnesium 2.3 from 1.4  Physical debility Continue fall precautions.  Goals of care DNR as of 12/12/2021. Palliative care team following.       Advance Care Planning:   Code Status: DNR.   Consults: Oncology, Neurosurgery, palliative care team.    Family Communication: Updated patient's wife in person at bedside.  Updated patient's son via phone.  Disposition Plan: Likely will discharge home with hospice care once neurosurgery signs off.   Procedures: Right thoracentesis on 12/12/2021 by IR.  Antimicrobials: Cefepime> 12/12/2021. IV vancomycin> 12/11/2021. P.o. cefdinir 12/12/21>>  DVT prophylaxis: SCDs.  Status is: Inpatient Patient requires at least 2 midnights for further evaluation and treatment of present condition.            Objective: Vitals:  12/13/21 0053 12/13/21 0144 12/13/21 0626 12/13/21 1439  BP: 119/77  124/82 118/80  Pulse: (!) 114 97 93 (!) 106  Resp: 20 16 19 20   Temp: 97.6 F (36.4 C)  97.8 F (36.6 C) (!) 97.5 F (36.4 C)  TempSrc: Oral  Oral Oral  SpO2: 99% 99% 99% 95%   Weight:      Height:        Intake/Output Summary (Last 24 hours) at 12/13/2021 1630 Last data filed at 12/12/2021 2200 Gross per 24 hour  Intake --  Output 650 ml  Net -650 ml   Filed Weights   12/10/21 0427  Weight: 76.7 kg    Exam:  General: 71 y.o. year-old male frail-appearing he is alert and oriented x3.  Not in acute distress.   Cardiovascular: Regular rate and rhythm no rubs or gallops.   Respiratory: Clear to auscultation no wheezes or rales. Abdomen: Soft monitor normal bowel sounds present. Musculoskeletal: No lower extremity edema bilaterally. Skin: No ulcerative lesions noted. Psychiatry: Mood is appropriate for condition 7. Neuro: Moves all 4 extremities.   Data Reviewed: CBC: Recent Labs  Lab 12/10/21 0438 12/11/21 0059 12/12/21 0721 12/13/21 0644  WBC 13.5* 10.8* 13.2* 10.5  HGB 11.2* 9.9* 11.1* 10.7*  HCT 32.7* 29.3* 33.1* 32.2*  MCV 86.7 87.5 87.8 89.9  PLT 177 156 212 563   Basic Metabolic Panel: Recent Labs  Lab 12/10/21 0438 12/11/21 0059 12/11/21 0925 12/11/21 1501 12/13/21 0644  NA 127* 128* 129* 130* 138  K 3.3* 3.4* 3.6 3.9 4.7  CL 96* 96* 95* 97* 98  CO2 22 22 24 24 30   GLUCOSE 122* 110* 128* 126* 151*  BUN 19 15 14 12 18   CREATININE 0.59* 0.64 0.60* 0.57* 0.86  CALCIUM 8.6* 8.6* 8.6* 8.7* 9.2  MG  --  1.5* 1.4*  --  2.3  PHOS  --  5.2* 4.0 4.1 4.3   GFR: Estimated Creatinine Clearance: 76.2 mL/min (by C-G formula based on SCr of 0.86 mg/dL). Liver Function Tests: Recent Labs  Lab 12/10/21 0438 12/11/21 0059 12/11/21 0925 12/11/21 1501  AST 23  --   --   --   ALT 25  --   --   --   ALKPHOS 141*  --   --   --   BILITOT 1.0  --   --   --   PROT 6.5  --   --   --   ALBUMIN 2.6* 2.0* 2.1* 2.2*   No results for input(s): LIPASE, AMYLASE in the last 168 hours. No results for input(s): AMMONIA in the last 168 hours. Coagulation Profile: Recent Labs  Lab 12/10/21 0438 12/11/21 0059  INR 1.1 1.2   Cardiac  Enzymes: No results for input(s): CKTOTAL, CKMB, CKMBINDEX, TROPONINI in the last 168 hours. BNP (last 3 results) No results for input(s): PROBNP in the last 8760 hours. HbA1C: No results for input(s): HGBA1C in the last 72 hours. CBG: Recent Labs  Lab 12/10/21 0438  GLUCAP 133*   Lipid Profile: No results for input(s): CHOL, HDL, LDLCALC, TRIG, CHOLHDL, LDLDIRECT in the last 72 hours. Thyroid Function Tests: No results for input(s): TSH, T4TOTAL, FREET4, T3FREE, THYROIDAB in the last 72 hours. Anemia Panel: No results for input(s): VITAMINB12, FOLATE, FERRITIN, TIBC, IRON, RETICCTPCT in the last 72 hours. Urine analysis:    Component Value Date/Time   COLORURINE YELLOW 12/10/2021 0438   APPEARANCEUR CLOUDY (A) 12/10/2021 0438   LABSPEC 1.012 12/10/2021 0438   PHURINE 7.0  12/10/2021 [place] 12/10/2021 0438   HGBUR NEGATIVE 12/10/2021 0438   BILIRUBINUR NEGATIVE 12/10/2021 0438   KETONESUR 5 (A) 12/10/2021 0438   PROTEINUR NEGATIVE 12/10/2021 0438   NITRITE NEGATIVE 12/10/2021 0438   LEUKOCYTESUR NEGATIVE 12/10/2021 0438   Sepsis Labs: @LABRCNTIP (procalcitonin:4,lacticidven:4)  ) Recent Results (from the past 240 hour(s))  Resp Panel by RT-PCR (Flu A&B, Covid) Nasopharyngeal Swab     Status: None   Collection Time: 12/10/21  4:38 AM   Specimen: Nasopharyngeal Swab; Nasopharyngeal(NP) swabs in vial transport medium  Result Value Ref Range Status   SARS Coronavirus 2 by RT PCR NEGATIVE NEGATIVE Final    Comment: (NOTE) SARS-CoV-2 target nucleic acids are NOT DETECTED.  The SARS-CoV-2 RNA is generally detectable in upper respiratory specimens during the acute phase of infection. The lowest concentration of SARS-CoV-2 viral copies this assay can detect is 138 copies/mL. A negative result does not preclude SARS-Cov-2 infection and should not be used as the sole basis for treatment or other patient management decisions. A negative result may occur with   improper specimen collection/handling, submission of specimen other than nasopharyngeal swab, presence of viral mutation(s) within the areas targeted by this assay, and inadequate number of viral copies(<138 copies/mL). A negative result must be combined with clinical observations, patient history, and epidemiological information. The expected result is Negative.  Fact Sheet for Patients:  EntrepreneurPulse.com.au  Fact Sheet for Healthcare Providers:  IncredibleEmployment.be  This test is no t yet approved or cleared by the [place] FDA and  has been authorized for detection and/or diagnosis of SARS-CoV-2 by FDA under an Emergency Use Authorization (EUA). This EUA will remain  in effect (meaning this test can be used) for the duration of the COVID-19 declaration under Section 564(b)(1) of the Act, 21 U.S.C.section 360bbb-3(b)(1), unless the authorization is terminated  or revoked sooner.       Influenza A by PCR NEGATIVE NEGATIVE Final   Influenza B by PCR NEGATIVE NEGATIVE Final    Comment: (NOTE) The Xpert Xpress SARS-CoV-2/FLU/RSV plus assay is intended as an aid in the diagnosis of influenza from Nasopharyngeal swab specimens and should not be used as a sole basis for treatment. Nasal washings and aspirates are unacceptable for Xpert Xpress SARS-CoV-2/FLU/RSV testing.  Fact Sheet for Patients: EntrepreneurPulse.com.au  Fact Sheet for Healthcare Providers: IncredibleEmployment.be  This test is not yet approved or cleared by the [place] FDA and has been authorized for detection and/or diagnosis of SARS-CoV-2 by FDA under an Emergency Use Authorization (EUA). This EUA will remain in effect (meaning this test can be used) for the duration of the COVID-19 declaration under Section 564(b)(1) of the Act, 21 U.S.C. section 360bbb-3(b)(1), unless the authorization is terminated  or revoked.  Performed at [facility], [place] [address] [place], [place] [address]   Blood Culture (routine x 2)     Status: None (Preliminary result)   Collection Time: 12/10/21  4:38 AM   Specimen: BLOOD  Result Value Ref Range Status   Specimen Description   Final    BLOOD BLOOD LEFT FOREARM Performed at [place] [address], [place], [place] [address]    Special Requests   Final    BOTTLES DRAWN AEROBIC AND ANAEROBIC Blood Culture adequate volume Performed at [place] [address] [place], [place] [address]    Culture   Final    NO GROWTH 3 DAYS Performed at [facility], [place]  [address] [place], [place] [address]    Report Status PENDING  Incomplete  Urine Culture     Status: None   Collection Time: 12/10/21  4:38 AM   Specimen: In/Out Cath Urine  Result Value Ref Range Status   Specimen Description   Final    IN/OUT CATH URINE Performed at [place] [address], [place], [place] [address]    Special Requests   Final    NONE Performed at [facility], [place] [address] [place], [place] [address]    Culture   Final    NO GROWTH Performed at [facility], [place] [address], [place], [place] [address]    Report Status 12/11/2021 FINAL  Final  Blood Culture (routine x 2)     Status: None (Preliminary result)   Collection Time: 12/10/21  8:24 AM   Specimen: BLOOD RIGHT HAND  Result Value Ref Range Status   Specimen Description   Final    BLOOD RIGHT HAND Performed at [place] [address], [place], [place] [address]    Special Requests   Final    BOTTLES DRAWN AEROBIC AND ANAEROBIC Blood Culture results may not be optimal due to an excessive volume of blood received in culture bottles Performed at [place] [address], [place], [place] [address]    Culture   Final    NO GROWTH 3  DAYS Performed at [facility], [place] [address], [place], [place] [address]    Report Status PENDING  Incomplete  MRSA Next Gen by PCR, Nasal     Status: None   Collection Time: 12/10/21  6:55 PM   Specimen: Nasal Mucosa; Nasal Swab  Result Value Ref Range Status   MRSA by PCR Next Gen NOT DETECTED NOT DETECTED Final    Comment: (NOTE) The GeneXpert MRSA Assay (FDA approved for NASAL specimens only), is one component of a comprehensive MRSA colonization surveillance program. It is not intended to diagnose MRSA infection nor to guide or monitor treatment for MRSA infections. Test performance is not FDA approved in patients less than 59 years old. Performed at [facility], [place] [address], [place], [place] [address]       Studies: No results found.  Scheduled Meds:  amLODipine  10 mg Oral Daily   aspirin EC  81 mg Oral Daily   atorvastatin  20 mg Oral Daily   calcitonin (salmon)  1 spray Alternating Nares Daily   carvedilol  6.25 mg Oral BID WC   cefdinir  300 mg Oral Q12H   dexamethasone  2 mg Oral TID   feeding supplement  237 mL Oral TID BM   folic acid  1 mg Oral Daily   gabapentin  300 mg Oral TID   ibuprofen  800 mg Oral TID   lidocaine  1 patch Transdermal Q24H   losartan  100 mg Oral Daily   melatonin  3 mg Oral QHS   morphine  15 mg Oral Q12H   multivitamin with minerals  1 tablet Oral Daily   pantoprazole  40 mg Oral Daily   polyethylene glycol  17 g Oral BID   potassium chloride  40 mEq Oral Daily   QUEtiapine  12.5 mg Oral BID   senna  2 tablet Oral QHS   tamsulosin  0.4 mg Oral QPC supper   traZODone  50 mg Oral QHS    Continuous Infusions:  LOS: 3 days     Kayleen Memos, MD [facility] Pager [phone]  If 7PM-7AM, please contact night-coverage [email or web address] Password [email or web address] 12/13/2021, 4:30 PM

## 2021-12-13 NOTE — Progress Notes (Addendum)
Palliative Medicine Inpatient Follow Up Note  HPI: Carlos Erickson is a 71 y.o. male with medical history significant of NSCLC w/ bone mets, HTN, HLD, CAD s/p CABG. Presenting from home with AMS and dyspnea. History obtained from wife. She said she noticed that he was in a coughing spell this morning. He couldn't seem to catch his breath and he seemed frustrated. He seemed to be in distress, but also confused. She became concerned and called for EMS. She denies any other aggravating or alleviating factors. Work-up revealed bibasilar pneumonia and bilateral pleural effusions right greater than left.  Also revealed metastatic disease worst at T11 with a burst fracture and some retropulsion and some new fracture lines across the pedicle bilaterally from NSCLC metastasis involving bones and liver.   Palliative care has been asked to get involved in the setting of metastatic non-small cell lung cancer to further address goals of care and to aid in symptom management.  Today's Discussion (12/13/2021):  *Please note that this is a verbal dictation therefore any spelling or grammatical errors are due to the "[employer]" system interpretation.  Chart reviewed inclusive of vital signs, progress notes, laboratory results, and diagnostic images.   I met with [name] at bedside this morning. He had had a tumultuous later afternoon into evening yesterday in the setting of delirium. We discussed this morning that he thought he was in his brothers home and swatted at the nursing staff. He shares that he does not recall any of this.   Upon AM assessment [name] is appropriate and is aware he is in the hospital. He is knowledgeable of his cancer diagnosis.   Presently, he is leaning towards no additional treatments (no more chemo, no more radiation).   [name] would like to get his pain under control. This morning he shares that he is comfortable and not experiencing any back pain. Reviewed the  possibility of an additional intervention for his back pain which he is for the time being agreeable towards.  From a dyspnea perspective [name] notes improvement. I shared his O2 needs are decreased to 3LPM which is half of what he needed yesterday.   Created space and opportunity for patient to explore thoughts feelings and fears regarding current medical situation.  Questions and concerns addressed   Palliative Support Provided ____________________________________ Addendum:  I called patients wife this morning. I updated her as above. She expresses that will be here later this afternoon (2:30PM) and if hopeful to meet with the Oncology team. I shared that I do not know when they plan on rounding though I will let them know of this interest.  ___________________________________ Addendum 2:  I met with [name], and their son [name] this evening. We reviewed that he does not want to continue with chemotherapy or radiation.  [name] does desire intervention from neurosurgery for pain and afterwards would like to transition home on hospice care.  Dr. Nevada Crane made aware to coordinate with neurosurgery.   TOC made aware to offer choice.  Additional time: 30 minutes  Objective Assessment: Vital Signs Vitals:   12/13/21 0144 12/13/21 0626  BP:  124/82  Pulse: 97 93  Resp: 16 19  Temp:  97.8 F (36.6 C)  SpO2: 99% 99%    Intake/Output Summary (Last 24 hours) at 12/13/2021 0956 Last data filed at 12/12/2021 2200 Gross per 24 hour  Intake --  Output 1450 ml  Net -1450 ml   Last Weight  Most recent update: 12/10/2021  4:27  AM    Weight  76.7 kg (169 lb)            Gen: Elderly Caucasian male in no acute distress HEENT: Dry mucous membranes CV: Irregular rate and  rhythm PULM: On 3 L/min nasal cannula incrementally fatigued with movement ABD: soft/nontender EXT: No edema Neuro: Alert and oriented x3  SUMMARY OF RECOMMENDATIONS   DNAR/DNI   MOST / DNR Form  Completed, paper copy placed onto the chart electric copy can be found in Vynca   Chaplain support requested   Symptom management as below   Goals are to have pain so well controlled that patient can go fishing again   Ongoing palliative support  Plan to transition home with hospice after neurosurgery weighs in on additional interventions.    Code Status/Advance Care Planning: DNAR/DNI   Symptom Management:  Pain due to NCSLC with metastasis to the bones: - Continue MS Contin 15 mg twice daily - Continue hydromorphone 2 mg every 6 hours as needed - Continue gabapentin 300 mg 3 times daily - Start ibuprofen 800 mg 3 times daily for trial of 3 days - Start dexamethasone 2 mg 3 times daily for trial of 3 days - Cyclobenzaprine 5 mg 3 times daily as needed muscle spasms - Lidoderm 1 patch QDay - Start calcitonin nasal spray 1 Nare alternating daily for 2 weeks - Appreciate Oncology team's consideration of Prolia/Xgeva --> Right now they do not think this would be helpful   Failure to Thrive: - Appreciate nutritional involvement and management - Patient endorses that he drinks ensures at home   Xerostomia in the setting of radiation treatment: -Biotene twice daily   Insomnia: - Melatonin 3 mg nightly at bedtime   Dyspnea: - Supplemental O2 - S/P thoracentesis (2/19) (-)430m - Ongoing management per primary team - Dilaudid as above to also alleviate these symptoms   Anxiety: - Incremental in occurrence will initiate low-dose clonazepam if patient endorses continuation   Constipation: - Continue MiraLAX twice daily - Continue senna 2 tabs at bedtime - LMB (2/19 - large)  Delirium: - Precautions ordered - Seroquel 12.549mBID  MDM - High ______________________________________________________________________________________ [facility] Team Cell Phone: [phone] utilize secure chat with additional questions, if  there is no response within 30 minutes please call the above phone number  Palliative Medicine Team providers are available by phone from 7am to 7pm daily and can be reached through the team cell phone.  Should this patient require assistance outside of these hours, please call the patient's attending physician.

## 2021-12-13 NOTE — Plan of Care (Signed)

## 2021-12-13 NOTE — Progress Notes (Addendum)
HEMATOLOGY-ONCOLOGY PROGRESS NOTE  ASSESSMENT AND PLAN: 1.  Metastatic lung adenocarcinoma 2.  Acute respiratory failure with hypoxia 3.  Pathologic fracture of the thoracic vertebra 4.  Mild anemia 5.  Leukocytosis 6.  Hyponatremia 7.  Hypokalemia 8.  Goals of care  -We had a discussion today with the patient and his family regarding consideration of systemic chemotherapy versus transition to comfort measures/hospice.  The patient's performance status is too poor at this point to consider chemotherapy.  The patient has indicated that he does not want to pursue chemo. -We discussed palliative care versus hospice and what each would look like.  The patient is agreeable to hospice upon discharge in his home. -Palliative care is following and assisting with goals of care discussions and symptom management.  Appreciate their assistance. -Agree with DNR/DNI. -We discussed neurosurgery recommendations which may provide pain control and allow him to return to ambulation.  He would like to proceed.  He will continue discussions with neurosurgery about this.  He notes that results are not guaranteed. -Continue management of pneumonia per hospitalist.  Mikey Bussing, DNP, AGPCNP-BC, AOCNP   SUBJECTIVE: Mr. Custis reports improvement in his pain.  He does still have some pain when he moves certain ways.  His wife and son are at the bedside.  They state that he has had some periods of agitation as well as confusion.  He has been started on Seroquel and he did receive a dose of Haldol last evening.  He reports improvement in his breathing.  No other new complaints today.  Oncology History  Bone metastasis ([place])   Initial Diagnosis   Bone metastasis ([place])   10/31/2021 Imaging   EXAM: CT LUMBAR SPINE WITH CONTRAST  IMPRESSION: 1. 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 Moderate pathologic fracture of T11, with suspected epidural and extraosseous extension of tumor there.  Consider encroachment on the lower thoracic spinal cord, MRI would be confirmatory. Mild pathologic fracture of T12. L4 and L5 vertebral metastases also suspected.   2. See also CTA abdomen and Pelvis today reported separately.   3. Lumbar spine degeneration with only mild degenerative spinal stenosis.   10/31/2021 Imaging   EXAM: CTA ABDOMEN AND PELVIS WITHOUT AND WITH CONTRAST  IMPRESSION: VASCULAR   1. Negative for abdominal aortic aneurysm, dissection or other acute vascular abnormality. 2. Aortic Atherosclerosis (ICD10-I70.0).   NON-VASCULAR   1. Unfortunately, CT findings are highly concerning for a centrally necrotic 7.4 cm primary bronchogenic carcinoma in the posterior right lower lobe with multifocal osseous metastatic disease including likely pathologic fractures at T11 and T12. Given central necrosis, the primary differential consideration is squamous cell carcinoma. Recommend referral to multi disciplinary thoracic tumor board for further evaluation and tissue diagnosis. 2. Pathologic T11 compression fracture with approximately 40% height loss. 3. Likely subtle pathologic fracture involving the superior endplate of S50 without height loss. 4. Multifocal osseous metastatic disease including at least T9, T11, T12, L4 and L5. 5. No solid organ metastasis identified within the abdomen or pelvis. 6. 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   10/31/2021 Imaging   EXAM: CT CHEST WITH CONTRAST  IMPRESSION: 1. There is a thick-walled subpleural cavitary lesion of the dependent right lower lobe measuring 6.9 cm, highly concerning for primary lung malignancy. Cavitary infection is however a general differential consideration. 2. Wedge deformity of T11 and superior endplate deformity of N39 with subtle underlying lytic character, highly concerning for osseous metastatic disease and pathologic fracture. 3. Additional  small lucent focus at the  left aspect of T9, suspicious for an additional metastatic lesion. 4. No evidence of lymphadenopathy in the chest. 5. Emphysema. 6. Coronary artery disease.   Aortic Atherosclerosis (ICD10-I70.0) and Emphysema (ICD10-J43.9).   11/09/2021 Initial Biopsy   FINAL MICROSCOPIC DIAGNOSIS:  A. LUNG, RLL, FINE NEEDLE ASPIRATION AND BIOPSIES:  - Adenocarcinoma.  See comment.  B. LUNG, RLL, BRUSH:  - Adenocarcinoma.   FINAL MICROSCOPIC DIAGNOSIS:  C. LYMPH NODE, 7, FINE NEEDLE ASPIRATION:  - Adenocarcinoma.  COMMENT:  The cells stain positively for cytokeratin 7 and cytokeratin 20, and  have patchy positivity for CDX2.  TTF-1 and p40 immunostains are negative.  The differential diagnosis includes primary pulmonary adenocarcinoma as well as a GI primary.   11/11/2021 Imaging   EXAM: MRI THORACIC WITHOUT AND WITH CONTRAST  IMPRESSION: Numerous levels of vertebral metastatic disease (listed above) most notable at T11 and T12 where there is confluent body involvement and pathologic fracture. Body fracture at T11 causes 60% height loss and retropulsion with ventral epidural tumor contacting but not compressing the cord.   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 Negative for chills and fever.  HENT: Negative.    Respiratory:  Positive for cough and  shortness of breath.   Cardiovascular: Negative.   Gastrointestinal: Negative.   Musculoskeletal:  Positive for back pain.       Reports that back pain is improved  Skin: Negative.   Psychiatric/Behavioral: Negative.     I have reviewed the past medical history, past surgical history, social history and family history with the patient and they are unchanged from previous note.   PHYSICAL EXAMINATION: ECOG PERFORMANCE STATUS: 4 - Bedbound  Vitals:   12/13/21 0144 12/13/21 0626  BP:  124/82  Pulse: 97 93  Resp: 16 19  Temp:  97.8 F (36.6 C)  SpO2: 99% 99%   Filed Weights   12/10/21 0427  Weight: 76.7 kg    Intake/Output from previous day: 02/19 0701 - 02/20 0700 In: -  Out: 1450 [Urine:1450]  Physical Exam Vitals reviewed.  Constitutional:      Appearance: He is ill-appearing.  HENT:     Head: Normocephalic.  Eyes:     General: No scleral icterus.    Conjunctiva/sclera: Conjunctivae normal.  Cardiovascular:     Rate and Rhythm: Tachycardia present.  Pulmonary:     Effort: Pulmonary effort is normal. No respiratory distress.  Abdominal:     General: There is no distension.     Palpations: Abdomen is soft.     Tenderness: There is no abdominal tenderness.  Skin:    General: Skin is warm and dry.  Neurological:     Mental Status: He is alert and oriented to person, place, and time.    LABORATORY DATA:  I have reviewed the data  as listed CMP Latest Ref Rng & Units 12/13/2021 12/11/2021 12/11/2021  Glucose 70 - 99 mg/dL 151(H) 126(H) 128(H)  BUN 8 - 23 mg/dL 18 12 14   Creatinine 0.61 - 1.24 mg/dL 0.86 0.57(L) 0.60(L)  Sodium 135 - 145 mmol/L 138 130(L) 129(L)  Potassium 3.5 - 5.1 mmol/L 4.7 3.9 3.6  Chloride 98 - 111 mmol/L 98 97(L) 95(L)  CO2 22 - 32 mmol/L 30 24 24   Calcium 8.9 - 10.3 mg/dL 9.2 8.7(L) 8.6(L)  Total Protein 6.5 - 8.1 g/dL - - -  Total Bilirubin 0.3 - 1.2 mg/dL - - -  Alkaline Phos 38 - 126 U/L - - -  AST 15 - 41 U/L - - -  ALT 0 - 44 U/L  - - -    Lab Results  Component Value Date   WBC 10.5 12/13/2021   HGB 10.7 (L) 12/13/2021   HCT 32.2 (L) 12/13/2021   MCV 89.9 12/13/2021   PLT 226 12/13/2021   NEUTROABS 9.4 (H) 12/03/2021    Lab Results  Component Value Date   PSA1 0.7 01/18/2018    CT Head Wo Contrast  Result Date: 12/10/2021 CLINICAL DATA:  Altered mental status, recent diagnosis of non-small cell lung cancer EXAM: CT HEAD WITHOUT CONTRAST TECHNIQUE: Contiguous axial images were obtained from the base of the skull through the vertex without intravenous contrast. RADIATION DOSE REDUCTION: This exam was performed according to the departmental dose-optimization program which includes automated exposure control, adjustment of the mA and/or kV according to patient size and/or use of iterative reconstruction technique. COMPARISON:  Brain MRI 11/11/2021 FINDINGS: Brain: There is no evidence of acute intracranial hemorrhage, extra-axial fluid collection, or acute infarct. Parenchymal volume is normal. The ventricles are normal in size. Gray-white differentiation is preserved. There is no significant burden of white matter microangiopathic change. There is no mass lesion.  There is no midline shift. Vascular: There is calcification of the bilateral cavernous ICAs. Skull: Normal. Negative for fracture or focal lesion. Sinuses/Orbits: The paranasal sinuses are clear. A right lens implant is noted. The globes and orbits are otherwise unremarkable. Other: None. IMPRESSION: No acute intracranial pathology. Electronically Signed   By: Valetta Mole M.D.   On: 12/10/2021 09:27   CT Angio Chest PE W and/or Wo Contrast  Result Date: 12/10/2021 CLINICAL DATA:  A 71 year old male presents for evaluation of nonlocalized abdominal pain and altered mental status. Also found to have findings of bronchogenic or suspected bronchogenic neoplasm on previous imaging. EXAM: CT ANGIOGRAPHY CHEST CT ABDOMEN AND PELVIS WITH CONTRAST TECHNIQUE:  Multidetector CT imaging of the chest was performed using the standard protocol during bolus administration of intravenous contrast. Multiplanar CT image reconstructions and MIPs were obtained to evaluate the vascular anatomy. Multidetector CT imaging of the abdomen and pelvis was performed using the standard protocol during bolus administration of intravenous contrast. RADIATION DOSE REDUCTION: This exam was performed according to the departmental dose-optimization program which includes automated exposure control, adjustment of the mA and/or kV according to patient size and/or use of iterative reconstruction technique. CONTRAST:  144mL OMNIPAQUE IOHEXOL 350 MG/ML SOLN COMPARISON:  Comparison is made with October 31, 2021. FINDINGS: CTA CHEST FINDINGS Cardiovascular: Calcified and noncalcified atheromatous plaque in the thoracic aorta. Heart size mildly enlarged. No substantial pericardial effusion. Changes of median sternotomy for CABG. Central pulmonary vasculature is of normal caliber. Main pulmonary artery opacified to 258 Hounsfield units. Assessment of pulmonary vasculature to the proximal or central segmental level without signs of pulmonary embolism.  Some respiratory motion limiting assessment at the lung bases. Mediastinum/Nodes: Top-normal RIGHT paratracheal lymph node (image 28/4) 13 mm. Scattered small lymph nodes elsewhere in the chest. No axillary or thoracic inlet adenopathy. RIGHT paratracheal lymph node is slightly larger than on the previous exam from October 31, 2021. Lungs/Pleura: Interval development of a RIGHT-sided effusion. Cavitary masslike area is mast by this pleural effusion on the previous study appearing less cavitary than on the prior exam. This mass measures as much as 4.3 cm. Perhaps filled with more fluid on today's study. Mild basilar volume loss bilaterally. Musculoskeletal: Lytic changes about the LEFT scapula which has developed in the short interval lesion measuring 3.1 cm  (image 8/10) Mottled appearance of the RIGHT scapula which is incompletely imaged (image 3/10) no distinct lesion. This finding new since previous imaging. Lucent appearance of various vertebral bodies, increasingly lucent since previous imaging particularly at the T1, T5, T6, T7, T8 and T9 levels. T10 with mottled heterogeneity as well. Interval compression fracture presumed pathologic of the superior endplate of T6. This shows mild loss of height approximately 20%. Further loss of height at T11 slightly greater than or at 50%. Mild retropulsion of posterior cortical elements is similar to previous imaging. Fractures and abnormal bone extending into the LEFT greater than RIGHT T11 pedicles. This is concerning for unstable fracture. Review of the MIP images confirms the above findings. CT ABDOMEN and PELVIS FINDINGS Hepatobiliary: Hepatic metastatic lesion (image 40/2) 2 cm. No additional focal suspicious hepatic lesions. Portal vein is patent. No pericholecystic stranding. Pancreas: Normal, without mass, inflammation or ductal dilatation. Spleen: Normal. Adrenals/Urinary Tract: Adrenal glands are normal. Hypodense lesion in the interpolar RIGHT kidney is indeterminate potentially a small solid lesion measuring 12 mm (image 38/2) similarly an indeterminate subcentimeter lesion arising from the lower pole measuring 76 Hounsfield units (image 50/2 also in the RIGHT kidney. No suspicious renal lesion on the LEFT. Urinary bladder is unremarkable. Ureter and collecting systems are nondilated. No perinephric stranding. Stomach/Bowel: Colonic diverticulosis. No acute gastrointestinal findings. Vascular/Lymphatic: Aortic atherosclerosis. No sign of aneurysm. Smooth contour of the IVC. There is no gastrohepatic or hepatoduodenal ligament lymphadenopathy. No retroperitoneal or mesenteric lymphadenopathy. No pelvic sidewall lymphadenopathy. Reproductive: Unremarkable. Other: No pneumoperitoneum.  No signs of ascites.  Musculoskeletal: Lytic lesion in the LEFT femoral head is new over the short interval (image 83/2) 2.1 cm. Patchy lytic process in the LEFT iliac crest also new. Lytic process in the RIGHT femoral head approximately 8 mm (image 80/2) new from previous imaging. T12 compression fracture with further loss of height now approaching 40% loss of height with mild retropulsion of posterior cortical elements along the superior aspect of the tubal body. Metastatic foci more apparent in the posterior aspect of L1 with early violation of posterior cortex and early extension of soft tissue into the anterior spinal canal (image 56/6) New lucent changes in the L2 vertebral body with increasing conspicuity of L3 lucent changes. New pathologic endplate compression approximately 10-20% loss of height at L4 since the prior study. Vague soft tissue along the posterior margin of L4 may represent early extension of tumor into the central canal (image 53/6) Further lucency at L5 and new lucency at the S1 level. Evidence of metastatic disease to RIGHT fourth rib laterally with bony destruction. Review of the MIP images confirms the above findings. IMPRESSION: 1. No evidence of pulmonary embolism. 2. Interval development of a RIGHT-sided effusion. 3. New pleural effusion in the RIGHT chest obscures the tumor which is more  dense than on previous imaging. 4. New basilar airspace disease surrounding this lesion does not allow for exclusion of concomitant infection. 5. 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 Pathologic fractures at multiple levels in the thoracic and lumbar spine some new and with worsening of fractures at T11 and T12. Pattern of fractures in T11 is suspicious for unstable injury based on extension in the bilateral pedicles of abnormal bone and fracture LEFT greater than RIGHT. 6. 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 7. Indeterminate lesions in the RIGHT kidney, potentially a small  solid lesion measuring 12 mm. This may be indicative of metastatic disease to the RIGHT kidney versus solid renal neoplasm. 8. Colonic diverticulosis without evidence of acute diverticulitis. 9. Aortic atherosclerosis. Aortic Atherosclerosis (ICD10-I70.0). Critical Value/emergent results of potentially unstable T11 pathologic fracture were called by telephone at the time of interpretation on 12/10/2021 at 9:50 am to provider PA Rona Ravens , who verbally acknowledged these results. Electronically Signed   By: Zetta Bills M.D.   On: 12/10/2021 09:52   CT ABDOMEN PELVIS W CONTRAST  Result Date: 12/10/2021 CLINICAL DATA:  A 71 year old male presents for evaluation of nonlocalized abdominal pain and altered mental status. Also found to have findings of bronchogenic or suspected bronchogenic neoplasm on previous imaging. EXAM: CT ANGIOGRAPHY CHEST CT ABDOMEN AND PELVIS WITH CONTRAST TECHNIQUE: Multidetector CT imaging of the chest was performed using the standard protocol during bolus administration of intravenous contrast. Multiplanar CT image reconstructions and MIPs were obtained to evaluate the vascular anatomy. Multidetector CT imaging of the abdomen and pelvis was performed using the standard protocol during bolus administration of intravenous contrast. RADIATION DOSE REDUCTION: This exam was performed according to the departmental dose-optimization program which includes automated exposure control, adjustment of the mA and/or kV according to patient size and/or use of iterative reconstruction technique. CONTRAST:  159mL OMNIPAQUE IOHEXOL 350 MG/ML SOLN COMPARISON:  Comparison is made with October 31, 2021. FINDINGS: CTA CHEST FINDINGS Cardiovascular: Calcified and noncalcified atheromatous plaque in the thoracic aorta. Heart size mildly enlarged. No substantial pericardial effusion. Changes of median sternotomy for CABG. Central pulmonary vasculature is of normal caliber. Main pulmonary artery opacified to 258  Hounsfield units. Assessment of pulmonary vasculature to the proximal or central segmental level without signs of pulmonary embolism. Some respiratory motion limiting assessment at the lung bases. Mediastinum/Nodes: Top-normal RIGHT paratracheal lymph node (image 28/4) 13 mm. Scattered small lymph nodes elsewhere in the chest. No axillary or thoracic inlet adenopathy. RIGHT paratracheal lymph node is slightly larger than on the previous exam from October 31, 2021. Lungs/Pleura: Interval development of a RIGHT-sided effusion. Cavitary masslike area is mast by this pleural effusion on the previous study appearing less cavitary than on the prior exam. This mass measures as much as 4.3 cm. Perhaps filled with more fluid on today's study. Mild basilar volume loss bilaterally. Musculoskeletal: Lytic changes about the LEFT scapula which has developed in the short interval lesion measuring 3.1 cm (image 8/10) Mottled appearance of the RIGHT scapula which is incompletely imaged (image 3/10) no distinct lesion. This finding new since previous imaging. Lucent appearance of various vertebral bodies, increasingly lucent since previous imaging particularly at the T1, T5, T6, T7, T8 and T9 levels. T10 with mottled heterogeneity as well. Interval compression fracture presumed pathologic of the superior endplate of T6. This shows mild loss of height approximately 20%. Further loss of height at T11 slightly greater than or at 50%. Mild retropulsion of posterior  cortical elements is similar to previous imaging. Fractures and abnormal bone extending into the LEFT greater than RIGHT T11 pedicles. This is concerning for unstable fracture. Review of the MIP images confirms the above findings. CT ABDOMEN and PELVIS FINDINGS Hepatobiliary: Hepatic metastatic lesion (image 40/2) 2 cm. No additional focal suspicious hepatic lesions. Portal vein is patent. No pericholecystic stranding. Pancreas: Normal, without mass, inflammation or ductal  dilatation. Spleen: Normal. Adrenals/Urinary Tract: Adrenal glands are normal. Hypodense lesion in the interpolar RIGHT kidney is indeterminate potentially a small solid lesion measuring 12 mm (image 38/2) similarly an indeterminate subcentimeter lesion arising from the lower pole measuring 76 Hounsfield units (image 50/2 also in the RIGHT kidney. No suspicious renal lesion on the LEFT. Urinary bladder is unremarkable. Ureter and collecting systems are nondilated. No perinephric stranding. Stomach/Bowel: Colonic diverticulosis. No acute gastrointestinal findings. Vascular/Lymphatic: Aortic atherosclerosis. No sign of aneurysm. Smooth contour of the IVC. There is no gastrohepatic or hepatoduodenal ligament lymphadenopathy. No retroperitoneal or mesenteric lymphadenopathy. No pelvic sidewall lymphadenopathy. Reproductive: Unremarkable. Other: No pneumoperitoneum.  No signs of ascites. Musculoskeletal: Lytic lesion in the LEFT femoral head is new over the short interval (image 83/2) 2.1 cm. Patchy lytic process in the LEFT iliac crest also new. Lytic process in the RIGHT femoral head approximately 8 mm (image 80/2) new from previous imaging. T12 compression fracture with further loss of height now approaching 40% loss of height with mild retropulsion of posterior cortical elements along the superior aspect of the tubal body. Metastatic foci more apparent in the posterior aspect of L1 with early violation of posterior cortex and early extension of soft tissue into the anterior spinal canal (image 56/6) New lucent changes in the L2 vertebral body with increasing conspicuity of L3 lucent changes. New pathologic endplate compression approximately 10-20% loss of height at L4 since the prior study. Vague soft tissue along the posterior margin of L4 may represent early extension of tumor into the central canal (image 53/6) Further lucency at L5 and new lucency at the S1 level. Evidence of metastatic disease to RIGHT fourth  rib laterally with bony destruction. Review of the MIP images confirms the above findings. IMPRESSION: 1. No evidence of pulmonary embolism. 2. Interval development of a RIGHT-sided effusion. 3. New pleural effusion in the RIGHT chest obscures the tumor which is more dense than on previous imaging. 4. New basilar airspace disease surrounding this lesion does not allow for exclusion of concomitant infection. 5. 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 Pathologic fractures at multiple levels in the thoracic and lumbar spine some new and with worsening of fractures at T11 and T12. Pattern of fractures in T11 is suspicious for unstable injury based on extension in the bilateral pedicles of abnormal bone and fracture LEFT greater than RIGHT. 6. 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 7. Indeterminate lesions in the RIGHT kidney, potentially a small solid lesion measuring 12 mm. This may be indicative of metastatic disease to the RIGHT kidney versus solid renal neoplasm. 8. Colonic diverticulosis without evidence of acute diverticulitis. 9. Aortic atherosclerosis. Aortic Atherosclerosis (ICD10-I70.0). Critical Value/emergent results of potentially unstable T11 pathologic fracture were called by telephone at the time of interpretation on 12/10/2021 at 9:50 am to provider PA Rona Ravens , who verbally acknowledged these results. Electronically Signed   By: Zetta Bills M.D.   On: 12/10/2021 09:52   DG Chest Port 1 View  Result Date: 12/12/2021 CLINICAL DATA:  Status post right thoracentesis. EXAM: PORTABLE  CHEST 1 VIEW COMPARISON:  12/10/2021 FINDINGS: 1016 hours. The lungs are clear without focal pneumonia, edema, pneumothorax or pleural effusion. Mild basilar atelectasis noted bilaterally. Cardiopericardial silhouette is at upper limits of normal for size. The visualized bony structures of the thorax show no acute abnormality. Telemetry leads overlie the chest. IMPRESSION: No  pneumothorax after right thoracentesis. Electronically Signed   By: Misty Stanley M.D.   On: 12/12/2021 11:03   DG Chest Port 1 View  Result Date: 12/10/2021 CLINICAL DATA:  Sepsis. EXAM: PORTABLE CHEST 1 VIEW COMPARISON:  November 09, 2021. FINDINGS: The heart size and mediastinal contours are within normal limits. Sternotomy wires are noted. Minimal left basilar subsegmental atelectasis is noted. Mild right infrahilar opacity is noted concerning for atelectasis or infiltrate. The visualized skeletal structures are unremarkable. IMPRESSION: Mild right infrahilar opacity is noted concerning for pneumonia or atelectasis. Followup PA and lateral chest X-ray is recommended in 3-4 weeks following trial of antibiotic therapy to ensure resolution and exclude underlying malignancy. Minimal left basilar subsegmental atelectasis is noted. Electronically Signed   By: Marijo Conception M.D.   On: 12/10/2021 05:18   IR Radiologist Eval & Mgmt  Result Date: 12/08/2021 EXAM: NEW PATIENT OFFICE VISIT CHIEF COMPLAINT: Severe thoracolumbar pain due to pathologic fractures at T11 and T12. Current Pain Level: 1-10 HISTORY OF PRESENT ILLNESS: 71 year old gentleman who has been referred for evaluation and treatment of pathologic fractures at T11-T12. Patient is accompanied his son and his spouse. History was obtained from the patient, the family, and also from [facility] records. Patient first noted severe acute onset of pain in the lower thoracic region approximately 2 months ago while lifting a sizable plant pot. Pain was almost immediate and severe without any radiation into the lower extremities. Patient underwent workup involving lumbar CT, which revealed multiple focal osseous lesions highly suspicious of metastatic disease. Most notably there is a pathologic fracture at T11. Full workup revealed a cavitating mass in the right lower lung which on biopsy proved to reveal a metastatic carcinoma. An MRI of the thoracic  spine performed November 11, 2021 revealed multiple focal lesions with abnormal signal at T5-T6, T11-T12 and L1. At T11 and T12 compression deformities were noted with abnormal signal probably representing metastatic pathological fractures. The patient has already received his course of radiation to the thoracic spine and lumbar spine. Patient presently remains on round the clock morphine and over-the-counter pain medications for pain relief. At best he says with medications his pain is a 4/5. Without the medication it is usually 8/10. Patient reports the pain is most noticeable and debilitating when turning, lifting, stooping,moving or standing. He denies any radiation of pain into the lower extremities in a radicular manner. Denies any autonomic dysfunction of bowel or bladder. The patient finds it difficult to ambulate even with a walker because of the severe pain. The patient reports no recent new respiratory or cardiovascular symptoms. He denies difficulty with swallowing, abdominal pain, nausea or vomiting. He does complain of constipation related to the pain medications. He denies any dysuria, hematuria, or polyuria. Patient reports a moderate decrease in appetite with associated weight loss. Denies any recent chills, fever or rigors. Diagnosis * : Date . * : Arthritis * : . * : Barrett's esophagus * : 07/04/2016 * : pt states he was told he does not have this. . * : CAD (coronary artery disease), native coronary artery * : 05/2014 * : Non-ST elevation myocardial infarction. Echocardiogram showed normal LV systolic  function with mild to moderate mitral regurgitation. Cardiac catheterization showed significant two-vessel coronary artery disease including the RCA and left anterior descending artery. He underwent CABG at [facility]. . * : Colon polyps * : . * : GERD (gastroesophageal reflux disease) * : . * : Hyperlipidemia * : . * : Hypertension * : . * : MI (myocardial infarction) ([place]) * : . * : Past heart attack,  05/11/2014 * : 07/09/2014 . * : Prediabetes * : . * : S/P CABG x 2 * : 07/09/2014 . * : Sleep apnea * : 03/2019 * : on CPAP SURGICAL HISTORY: Past Surgical History: Procedure * : Laterality * : Date . * : BRONCHIAL BIOPSY * : * : 11/09/2021 * : Procedure: BRONCHIAL BIOPSIES; Surgeon: Garner Nash, DO; Location: [facility]; Service: Pulmonary;; . * : BRONCHIAL BRUSHINGS * : * : 11/09/2021 * : Procedure: BRONCHIAL BRUSHINGS; Surgeon: Garner Nash, DO; Location: [facility]; Service: Pulmonary;; . * : BRONCHIAL NEEDLE ASPIRATION BIOPSY * : * : 11/09/2021 * : Procedure: BRONCHIAL NEEDLE ASPIRATION BIOPSIES; Surgeon: Garner Nash, DO; Location: [facility]; Service: Pulmonary;; . * : CARDIAC CATHETERIZATION * : * : 03/2014 * : [place] . * : cataract * : Right * : 1980 * : Dr. Katy Fitch . * : CORONARY ARTERY BYPASS GRAFT * : * : 05/2014 * : [facility], CABG x 2 . * : EYE SURGERY * : * : . * : HEMORROIDECTOMY * : * : . * : LUNG BIOPSY * : * : 2006 * : [facility] . * : VIDEO BRONCHOSCOPY WITH ENDOBRONCHIAL ULTRASOUND * : * : 11/09/2021 * : Procedure: VIDEO BRONCHOSCOPY WITH ENDOBRONCHIAL ULTRASOUND; Surgeon: Garner Nash, DO; Location: [facility]; Service: Pulmonary;; I have reviewed the social history and family history with the patient and they are unchanged from previous note. ALLERGIES: Is allergic to plasma protein fraction, icosapent ethyl, and morphine and related. MEDICATIONS: Current Outpatient Medications Medication * : Sig * : Dispense * : Refill . * : folic acid (FOLVITE) 1 MG tablet * : Take 1 tablet (1 mg total) by mouth daily. * : 30 tablet * : 0 . * : tamsulosin (FLOMAX) 0.4 MG CAPS capsule * : Take 1 capsule (0.4 mg total) by mouth daily after supper. * : 30 capsule * : 1 . * : acetaminophen (TYLENOL) 325 MG tablet * : Take 2 tablets (650 mg total) by mouth every 6 (six) hours as needed for up to 30 doses for mild pain or moderate pain. * : 30 tablet * : 0 . * : acetaminophen (TYLENOL) 500 MG tablet * : Take 500  mg by mouth every 6 (six) hours as needed for moderate pain or headache. * : * : . * : amLODipine (NORVASC) 10 MG tablet * : TAKE 1 TABLET BY MOUTH DAILY (Patient taking differently: Take 10 mg by mouth daily.) * : 90 tablet * : 3 . * : atorvastatin (LIPITOR) 20 MG tablet * : TAKE 1 TABLET BY MOUTH ONCE DAILY (Patient taking differently: Take 20 mg by mouth daily.) * : 90 tablet * : 3 . * : carvedilol (COREG) 6.25 MG tablet * : TAKE 1 TABLET BY MOUTH TWICE DAILY (Patient taking differently: Take 6.25 mg by mouth 2 (two) times daily with a meal.) * : 180 tablet * : 3 . * : clopidogrel (PLAVIX) 75 MG tablet * : TAKE 1 TABLET BY MOUTH ONCE DAILY (Patient taking differently:  Take 75 mg by mouth daily.) * : 90 tablet * : 1 . * : dexamethasone (DECADRON) 4 MG tablet * : Take 1 tablet (4 mg total) by mouth daily. * : 30 tablet * : 0 . * : Evolocumab (REPATHA SURECLICK) 119 MG/ML SOAJ * : Inject 1 pen into the skin every 14 (fourteen) days. (Patient not taking: Reported on 10/31/2021) * : 6 mL * : 3 . * : gabapentin (NEURONTIN) 300 MG capsule * : Take 1 capsule (300 mg total) by mouth 2 (two) times daily. * : 60 capsule * : 1 . * : HYDROmorphone (DILAUDID) 2 MG tablet * : Take 1 tablet (2 mg total) by mouth every 4 (four) hours as needed for up to 21 days for severe pain or moderate pain. * : 60 tablet * : 0 . * : losartan (COZAAR) 100 MG tablet * : TAKE 1 TABLET BY MOUTH DAILY (Patient taking differently: Take 100 mg by mouth daily.) * : 90 tablet * : 3 . * : morphine (MS CONTIN) 15 MG 12 hr tablet * : Take 1 tablet (15 mg total) by mouth every 12 (twelve) hours. * : 60 tablet * : 0 . * : omeprazole (PRILOSEC) 20 MG capsule * : Take 1 capsule (20 mg total) by mouth daily. * : 30 capsule REVIEW OF SYSTEMS: Negative unless as mentioned above PHYSICAL EXAMINATION: A targeted examination of the thoracolumbar spine revealed a new kyphotic curvature as per family with protuberance at the T11 level. This is associated with moderate  to severe tenderness in the T11-T12 regions with physical palpation. He also demonstrated mild tenderness in upper thoracic T6 region. Otherwise, neurologically no gross abnormal lateralizing abnormalities evident. ASSESSMENT AND PLAN: Findings of the MRI scan of the thoracic spine were reviewed with the patient and the family. Brought to their attention was the multi level involvement of the thoracic spine and possibly the upper lumbar spine with signal abnormalities suggestive of metastatic disease. However, the T11 pathologic fracture demonstrated retropulsion of bone posteriorly into the spinal canal with near contact with the conus. At T12 superior endplate compression fracture also probably related to metastatic disease was reviewed. It was felt for pain relief the T12 level would be appropriate for balloon kyphoplasty given no significant retropulsion. Both procedures will be performed in order to alleviate pain related to the pathological fractures. However, at T11 given the retropulsion, it was felt it would be safer to perform a vertebroplasty in order to mitigate potential further retropulsion with injury to the spinal cord. In terms of pain relief the procedures were almost identical. The procedure, the reasons, the potential complications, and alternatives were reviewed. Patient and the family would like to proceed with vertebral body augmentation at T11 and L2 as described. Notably the patient would have to stop his Plavix at least 5 days prior to the procedure. However, the patient could continue his baby aspirin though. They leave with good understanding and agreement with the above management plan. Electronically Signed   By: Luanne Bras M.D.   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 Request for  diagnostic and therapeutic right thoracentesis. EXAM: ULTRASOUND GUIDED DIAGNOSTIC AND THERAPEUTIC RIGHT THORACENTESIS MEDICATIONS: 10 mL 1 % lidocaine COMPLICATIONS: None immediate. PROCEDURE: An ultrasound guided thoracentesis was thoroughly discussed with the patient  and questions answered. The benefits, risks, alternatives and complications were also discussed. The patient understands and wishes to proceed with the procedure. Written consent was obtained. Ultrasound was performed to localize and mark an adequate pocket of fluid in the right chest. The area was then prepped and draped in the normal sterile fashion. 1% Lidocaine was used for local anesthesia. Under ultrasound guidance a 6 Fr Safe-T-Centesis catheter was introduced. Thoracentesis was performed. The catheter was removed and a dressing applied. FINDINGS: A total of approximately 400 cc of serosanguineous fluid was removed. Samples were sent to the laboratory as requested by the clinical team. IMPRESSION: Successful ultrasound guided right thoracentesis yielding 400 cc of pleural fluid. Read by: Narda Rutherford, AGNP-BC Electronically Signed   By: Markus Daft M.D.   On: 12/12/2021 11:44     Future Appointments  Date Time Provider [place]  12/17/2021 11:45 AM [facility] LAB [facility] None  12/17/2021 12:20 PM Truitt Merle, MD [facility] None  12/17/2021  1:30 PM [facility] INFUSION [facility] None  12/29/2021 10:30 AM Bruning, Ashlyn, PA-C [facility] None      LOS: 3 days   Addendum  I have seen the patient, examined him. I agree with the assessment and and plan and have edited the notes.   Mr. Johnsey is clinically stable, pain is controlled. Dr. Zada Finders has offered back surgery for his T11/T12 pathological fracture, and pt is leaning towards to have it done, so he can walk. I think that's a very reasonable decision and I support that. We again reviewed the role of chemo, and pt decided not to pursue in near future, which I  agree. He is open to hospice. Appreciate palliative care's input and care. Pt is DNR now. I will f/u as needed. I will be happy to be his attending when he is on hospice care.   Truitt Merle  12/13/2021

## 2021-12-13 NOTE — Progress Notes (Addendum)
This chaplain responded to PMT consult for spiritual care. The Pt. is awake and agrees to a chaplain visit. The Pt. shares his pain is at "1" in the chaplain introduction. The Pt. preferred name is "Carlos Erickson".  The Pt. shares he has been praying this morning.The chaplain listens reflectively as the Pt. participates in story telling.  The Pt. reoccurring themes are family and faith, the past and the present. The chaplain listened and introduced love as a common element in both stories. The chaplain observes the Pt. pattern of acceptance is  a nod and a chuckle as we revisit God's everlasting love.   The Pt. accepted the chaplain's invitation for prayer and F/U spiritual care.  Chaplain Sallyanne Kuster [phone]

## 2021-12-14 DIAGNOSIS — M8448XA Pathological fracture, other site, initial encounter for fracture: Secondary | ICD-10-CM

## 2021-12-14 LAB — CYTOLOGY - NON PAP

## 2021-12-14 MED ORDER — CEFDINIR 300 MG PO CAPS
300.0000 mg | ORAL_CAPSULE | Freq: Two times a day (BID) | ORAL | Status: AC
  Administered 2021-12-14 – 2021-12-15 (×2): 300 mg via ORAL
  Filled 2021-12-14 (×2): qty 1

## 2021-12-14 NOTE — Progress Notes (Addendum)
PROGRESS NOTE  Carlos Erickson TXM:[id number] DOB: 08-29-1951 DOA: 12/10/2021 PCP: Owens Loffler, MD  HPI/Recap of past 24 hours: Carlos Erickson is a 71 y.o. male with medical history significant of NSCLC w/ bone mets, HTN, HLD, CAD s/p CABG who presented from home with AMS, dyspnea, and mid back pain. Work-up revealed bibasilar pneumonia, bilateral pleural effusions right greater than left, metastatic disease worst at T11 with a burst fracture and some retropulsion and some new fracture lines across the pedicle bilaterally from NSCLC metastasis involving bones and liver.  Post right thoracentesis with 400 cc of serosanguineous fluid removed on 12/12/2021 by IR.  Fluid sent for analysis.  Hospital course complicated by agitation delirium 2/19, now resolved, was started on delirium precautions and management.  No recent use of alcohol, last alcohol intake was more than a month ago.    12/14/2021: Patient was seen and examined at his bedside.  There were no acute events overnight.  Endorses lower back pain, and also sacral pain.  Pain management in place with narcotics and Decadron.  Bowel regimen also in place to avoid opiate-induced constipation.   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 Switched to cefdinir on 12/12/2021. Leukocytosis has resolved.  Afebrile.    Malignant pleural effusion status post right thoracentesis by IR on 12/12/2021. Pleural fluid showed malignant cells consistent with adenocarcinoma  Resolved acute metabolic encephalopathy/agitation delirium in the setting of acute illness Delirium precautions in place Continue Seroquel 12.5 mg twice daily, trazodone  nightly Continue to reorient frequently Continue regulate sleep and wake cycle, out of bed to chair during the day, open blinds during the day. Continue to avoid benzodiazepines  Multiple pathologic fractures of the thoracic and lumbar spine Unstable T11 fracture     - neurosurgery onboard and requested transfer to [facility]; appreciate their assistance, will await final recs     -Continue pain management.  Decadron added on 12/12/2021 for pain by palliative, continue. Continue lidocaine patch daily. Appreciate neurosurgery's assistance.   NSCLC w/ bone and liver mets     - imaging notes mets to liver    HLD     -Continue home Lipitor   CAD     - continue home regimen   HTN     -Continue home aspirin and home Lipitor   Resolved post repletion: Refractory hyponatremia, suspect SIADH. Potassium 3.9.  Resolved post repletion: Hypomagnesemia Magnesium 2.3 from 1.4  Physical debility Continue fall precautions.  Goals of care DNR as of 12/12/2021. Palliative care team following. Plan to discharge to home with hospice care after neurosurgery.  Tentative schedule for Thursday morning 12/16/2021, pending anesthesia evaluation given his pulmonary function.       Advance Care Planning:   Code Status: DNR.   Consults: Oncology, Neurosurgery, palliative care team.    Family Communication: Updated patient's wife in person at bedside.  Updated patient's son via phone.  Disposition Plan: Likely will discharge home with hospice care once neurosurgery signs off.   Procedures: Right thoracentesis on 12/12/2021 by IR.  Antimicrobials: Cefepime> 12/12/2021. IV vancomycin> 12/11/2021. P.o. cefdinir 12/12/21>>  DVT prophylaxis: SCDs.  Status is: Inpatient Patient requires at least 2 midnights for further evaluation and treatment of present condition.  Objective: Vitals:   12/14/21 0421 12/14/21 0750 12/14/21 0954 12/14/21 1112  BP: (!) 142/81 (!) 150/92 (!) 136/92  (!) 133/93  Pulse:   99   Resp: 16  17 18   Temp: 97.7 F (36.5 C) 98 F (36.7 C) 97.9 F (36.6 C) 97.9 F (36.6 C)  TempSrc: Oral Oral Oral Oral  SpO2: 95%  95%   Weight:      Height:        Intake/Output Summary (Last 24 hours) at 12/14/2021 1411 Last data filed at 12/14/2021 1302 Gross per 24 hour  Intake 240 ml  Output 1850 ml  Net -1610 ml   Filed Weights   12/10/21 0427  Weight: 76.7 kg    Exam:  General: 71 y.o. year-old male frail-appearing no acute distress.  He is alert and oriented x3.  Pleasant.   Cardiovascular: Regular rate and rhythm no rubs or gallops. Respiratory: Clear to auscultation no wheezes or rales.   Abdomen: Soft nontender normal bowel sounds present.  Musculoskeletal: No lower extremity edema bilaterally.   Skin: No ulcerative lesions noted. Psychiatry: Mood is appropriate for condition.   Data Reviewed: CBC: Recent Labs  Lab 12/10/21 0438 12/11/21 0059 12/12/21 0721 12/13/21 0644  WBC 13.5* 10.8* 13.2* 10.5  HGB 11.2* 9.9* 11.1* 10.7*  HCT 32.7* 29.3* 33.1* 32.2*  MCV 86.7 87.5 87.8 89.9  PLT 177 156 212 154   Basic Metabolic Panel: Recent Labs  Lab 12/10/21 0438 12/11/21 0059 12/11/21 0925 12/11/21 1501 12/13/21 0644  NA 127* 128* 129* 130* 138  K 3.3* 3.4* 3.6 3.9 4.7  CL 96* 96* 95* 97* 98  CO2 22 22 24 24 30   GLUCOSE 122* 110* 128* 126* 151*  BUN 19 15 14 12 18   CREATININE 0.59* 0.64 0.60* 0.57* 0.86  CALCIUM 8.6* 8.6* 8.6* 8.7* 9.2  MG  --  1.5* 1.4*  --  2.3  PHOS  --  5.2* 4.0 4.1 4.3   GFR: Estimated Creatinine Clearance: 76.2 mL/min (by C-G formula based on SCr of 0.86 mg/dL). Liver Function Tests: Recent Labs  Lab 12/10/21 0438 12/11/21 0059 12/11/21 0925 12/11/21 1501  AST 23  --   --   --   ALT 25  --   --   --   ALKPHOS 141*  --   --   --   BILITOT 1.0  --   --   --   PROT 6.5  --   --   --   ALBUMIN 2.6* 2.0* 2.1* 2.2*   No results for input(s): LIPASE, AMYLASE in the last 168 hours. No  results for input(s): AMMONIA in the last 168 hours. Coagulation Profile: Recent Labs  Lab 12/10/21 0438 12/11/21 0059  INR 1.1 1.2   Cardiac Enzymes: No results for input(s): CKTOTAL, CKMB, CKMBINDEX, TROPONINI in the last 168 hours. BNP (last 3 results) No results for input(s): PROBNP in the last 8760 hours. HbA1C: No results for input(s): HGBA1C in the last 72 hours. CBG: Recent Labs  Lab 12/10/21 0438  GLUCAP 133*   Lipid Profile: No results for input(s): CHOL, HDL, LDLCALC, TRIG, CHOLHDL, LDLDIRECT in the last 72 hours. Thyroid Function Tests: No results for input(s): TSH, T4TOTAL, FREET4, T3FREE, THYROIDAB in the last 72 hours. Anemia Panel: No results for input(s): VITAMINB12, FOLATE, FERRITIN, TIBC, IRON, RETICCTPCT in the last 72 hours. Urine analysis:    Component Value Date/Time   COLORURINE YELLOW 12/10/2021 0438   APPEARANCEUR CLOUDY (A) 12/10/2021 0438   LABSPEC  1.012 12/10/2021 0438   PHURINE 7.0 12/10/2021 0438   GLUCOSEU NEGATIVE 12/10/2021 0438   HGBUR NEGATIVE 12/10/2021 0438   BILIRUBINUR NEGATIVE 12/10/2021 0438   KETONESUR 5 (A) 12/10/2021 0438   PROTEINUR NEGATIVE 12/10/2021 0438   NITRITE NEGATIVE 12/10/2021 0438   LEUKOCYTESUR NEGATIVE 12/10/2021 0438   Sepsis Labs: @LABRCNTIP (procalcitonin:4,lacticidven:4)  ) Recent Results (from the past 240 hour(s))  Resp Panel by RT-PCR (Flu A&B, Covid) Nasopharyngeal Swab     Status: None   Collection Time: 12/10/21  4:38 AM   Specimen: Nasopharyngeal Swab; Nasopharyngeal(NP) swabs in vial transport medium  Result Value Ref Range Status   SARS Coronavirus 2 by RT PCR NEGATIVE NEGATIVE Final    Comment: (NOTE) SARS-CoV-2 target nucleic acids are NOT DETECTED.  The SARS-CoV-2 RNA is generally detectable in upper respiratory specimens during the acute phase of infection. The lowest concentration of SARS-CoV-2 viral copies this assay can detect is 138 copies/mL. A negative result does not preclude  SARS-Cov-2 infection and should not be used as the sole basis for treatment or other patient management decisions. A negative result may occur with  improper specimen collection/handling, submission of specimen other than nasopharyngeal swab, presence of viral mutation(s) within the areas targeted by this assay, and inadequate number of viral copies(<138 copies/mL). A negative result must be combined with clinical observations, patient history, and epidemiological information. The expected result is Negative.  Fact Sheet for Patients:  EntrepreneurPulse.com.au  Fact Sheet for Healthcare Providers:  IncredibleEmployment.be  This test is no t yet approved or cleared by the [place] FDA and  has been authorized for detection and/or diagnosis of SARS-CoV-2 by FDA under an Emergency Use Authorization (EUA). This EUA will remain  in effect (meaning this test can be used) for the duration of the COVID-19 declaration under Section 564(b)(1) of the Act, 21 U.S.C.section 360bbb-3(b)(1), unless the authorization is terminated  or revoked sooner.       Influenza A by PCR NEGATIVE NEGATIVE Final   Influenza B by PCR NEGATIVE NEGATIVE Final    Comment: (NOTE) The Xpert Xpress SARS-CoV-2/FLU/RSV plus assay is intended as an aid in the diagnosis of influenza from Nasopharyngeal swab specimens and should not be used as a sole basis for treatment. Nasal washings and aspirates are unacceptable for Xpert Xpress SARS-CoV-2/FLU/RSV testing.  Fact Sheet for Patients: EntrepreneurPulse.com.au  Fact Sheet for Healthcare Providers: IncredibleEmployment.be  This test is not yet approved or cleared by the [place] FDA and has been authorized for detection and/or diagnosis of SARS-CoV-2 by FDA under an Emergency Use Authorization (EUA). This EUA will remain in effect (meaning this test can be used) for the duration of  the COVID-19 declaration under Section 564(b)(1) of the Act, 21 U.S.C. section 360bbb-3(b)(1), unless the authorization is terminated or revoked.  Performed at [facility], [place] [address] [place], [place] [address]   Blood Culture (routine x 2)     Status: None (Preliminary result)   Collection Time: 12/10/21  4:38 AM   Specimen: BLOOD  Result Value Ref Range Status   Specimen Description   Final    BLOOD BLOOD LEFT FOREARM Performed at [place] [address] [place], [place] [address]    Special Requests   Final    BOTTLES DRAWN AEROBIC AND ANAEROBIC Blood Culture adequate volume Performed at [place] [address] [place], [place] [address]    Culture   Final    NO GROWTH 4 DAYS Performed  at [facility], [place] [address], [place], [place] [address]    Report Status PENDING  Incomplete  Urine Culture     Status: None   Collection Time: 12/10/21  4:38 AM   Specimen: In/Out Cath Urine  Result Value Ref Range Status   Specimen Description   Final    IN/OUT CATH URINE Performed at [place] [address] [place], [place] [address]    Special Requests   Final    NONE Performed at [facility], [place] [address] [place], [place] [address]    Culture   Final    NO GROWTH Performed at [facility], [place] [address] [place], [place] [address]    Report Status 12/11/2021 FINAL  Final  Blood Culture (routine x 2)     Status: None (Preliminary result)   Collection Time: 12/10/21  8:24 AM   Specimen: BLOOD RIGHT HAND  Result Value Ref Range Status   Specimen Description   Final    BLOOD RIGHT HAND Performed at [place] [address] [place], [place] [address]    Special Requests   Final    BOTTLES DRAWN AEROBIC AND ANAEROBIC Blood Culture results may not be optimal due to an excessive volume of blood received in culture  bottles Performed at [place] [address], [place], [place] [address]    Culture   Final    NO GROWTH 4 DAYS Performed at [facility], [place] [address], [place], [place] [address]    Report Status PENDING  Incomplete  MRSA Next Gen by PCR, Nasal     Status: None   Collection Time: 12/10/21  6:55 PM   Specimen: Nasal Mucosa; Nasal Swab  Result Value Ref Range Status   MRSA by PCR Next Gen NOT DETECTED NOT DETECTED Final    Comment: (NOTE) The GeneXpert MRSA Assay (FDA approved for NASAL specimens only), is one component of a comprehensive MRSA colonization surveillance program. It is not intended to diagnose MRSA infection nor to guide or monitor treatment for MRSA infections. Test performance is not FDA approved in patients less than 19 years old. Performed at [facility], [place] [address], [place],  [address]       Studies: No results found.  Scheduled Meds:  amLODipine  10 mg Oral Daily   aspirin EC  81 mg Oral Daily   calcitonin (salmon)  1 spray Alternating Nares Daily   carvedilol  6.25 mg Oral BID WC   cefdinir  300 mg Oral Q12H   dexamethasone  2 mg Oral TID   feeding supplement  237 mL Oral TID BM   gabapentin  300 mg Oral TID   ibuprofen  800 mg Oral TID   lidocaine  1 patch Transdermal Q24H   losartan  100 mg Oral Daily   melatonin  3 mg Oral QHS   morphine  15 mg Oral Q12H   pantoprazole  40 mg Oral Daily   polyethylene glycol  17 g Oral BID   potassium chloride  40 mEq Oral Daily   QUEtiapine  12.5 mg Oral BID   senna  2 tablet Oral QHS   tamsulosin  0.4 mg Oral QPC supper   traZODone  50 mg Oral QHS    Continuous Infusions:     LOS: 4 days     Kayleen Memos, MD [facility] Pager [phone]  If 7PM-7AM, please  contact night-coverage [email or web address] Password [facility] 12/14/2021, 2:11 PM

## 2021-12-14 NOTE — Plan of Care (Signed)

## 2021-12-14 NOTE — TOC Initial Note (Signed)
Transition of Care [facility]) - Initial/Assessment Note    Patient Details  Name: Carlos Erickson MRN: [id number] Date of Birth: 07/15/1951  Transition of [facility]) CM/SW Contact:    Bethena Roys, RN Phone Number: 12/14/2021, 4:08 PM  Clinical Narrative: Risk for readmission assessment completed. PTA patient was from home with spouse. Case Manager received a referral for home hospice. Case Manager offered choice via Medicare.gov list. Patient chose [facility]. Referral submitted to the [facility] with durable medical equipment list: Hospital bed, overbed table, transport wheelchair and oxygen with humidification. Patient states he has a rolling walker at home.  Case Manager will continue to follow for additional transition of care needs.   Expected Discharge Plan: Home w Hospice Care Barriers to Discharge: Continued Medical Work up   Patient Goals and CMS Choice Patient states their goals for this hospitalization and ongoing recovery are:: to return home with hospice services CMS Medicare.gov Compare Post Acute Care list provided to:: Patient Choice offered to / list presented to : Patient  Expected Discharge Plan and Services Expected Discharge Plan: [place] In-house Referral: Hospice / Palliative Care Discharge Planning Services: CM Consult Post Acute Care Choice: Hospice Living arrangements for the past 2 months: Single Family Home                 DME Arranged: Hospital bed, Oxygen, Overbed table, Lightweight manual wheelchair with seat cushion Scientist, physiological) DME Agency: [facility]osp[facility] and [place] Date DME Agency Contacted: 12/14/21 Time DME Agency Contacted: [phone] Representative spoke with at DME Agency: Referral Line- [place]: RN [facility] Agency: Hospice and [place] ([place]) Date [place]: 12/14/21 Time [place]: [phone] Representative spoke with  at [place]: Referral Line  Prior Living Arrangements/Services Living arrangements for the past 2 months: [place] with:: Spouse Patient language and need for interpreter reviewed:: Yes Do you feel safe going back to the place where you live?: Yes      Need for Family Participation in Patient Care: Yes (Comment) Care giver support system in place?: Yes (comment) Current home services: DME (Patient has a rolling walker in the home.) Criminal Activity/Legal Involvement Pertinent to Current Situation/Hospitalization: No - Comment as needed  Activities of Daily Living Home Assistive Devices/Equipment: Shower chair without back, Walker (specify type), Eyeglasses, CBG Meter, Blood pressure cuff (4 wheel walker) ADL Screening (condition at time of admission) Patient's cognitive ability adequate to safely complete daily activities?: Yes Is the patient deaf or have difficulty hearing?: Yes ([place]) Does the patient have difficulty seeing, even when wearing glasses/contacts?: No Does the patient have difficulty concentrating, remembering, or making decisions?: No Patient able to express need for assistance with ADLs?: Yes Does the patient have difficulty dressing or bathing?: Yes Independently performs ADLs?: No Communication: Independent Dressing (OT): Needs assistance Grooming: Needs assistance Feeding: Independent Bathing: Needs assistance Is this a change from baseline?: Pre-admission baseline Toileting: Needs assistance, Independent with device (comment) Is this a change from baseline?: Pre-admission baseline In/Out Bed: Needs assistance, Independent with device (comment) Is this a change from baseline?: Pre-admission baseline Walks in Home: Needs assistance, Independent with device (comment) Is this a change from baseline?: Pre-admission baseline Does the patient have difficulty walking or climbing stairs?: Yes (back pain d/t metastasis to spine) Weakness of Legs:  Both Weakness of Arms/Hands: None  Permission Sought/Granted Permission sought to share information with : Family Supports, Customer service manager,  Case Manager Permission granted to share information with : Yes, Verbal Permission Granted     Permission granted to share info w AGENCY: [facility]        Emotional Assessment Appearance:: Appears stated age Attitude/Demeanor/Rapport: Engaged Affect (typically observed): Appropriate Orientation: : Oriented to Self, Oriented to Place, Oriented to  Time, Oriented to Situation Alcohol / Substance Use: Not Applicable Psych Involvement: No (comment)  Admission diagnosis:  Acute respiratory failure with hypoxia (HCC) [J96.01] PNA (pneumonia) [J18.9] Pathological fracture of thoracic vertebra, initial encounter [M84.48XA] Community acquired pneumonia, unspecified laterality [J18.9] Patient Active Problem List   Diagnosis Date Noted   PNA (pneumonia) 12/10/2021   Hyponatremia 12/10/2021   Hypokalemia 12/10/2021   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 ([place]) 11/13/2021   Primary cancer of right lower lobe of lung ([place])    Bone metastasis ([place])    Lip numbness 03/16/2020   Mouth ulcer 03/16/2020   Barrett's esophagus 07/04/2016   Peripheral neuropathy 01/05/2015   Hyperlipidemia    Unilateral recurrent femoral hernia without obstruction or gangrene 07/10/2014   Past heart attack, 05/11/2014 07/09/2014   S/P CABG x 2 07/09/2014   CAD (coronary artery disease), native coronary artery 07/09/2014   Hypertension    GERD (gastroesophageal reflux disease)    Arthritis    PCP:  Owens Loffler, MD Pharmacy:   [facility] [place] [address] [place]) [place] [address] Phone: [phone] Fax: [phone]  Readmission Risk Interventions Readmission Risk Prevention Plan 12/14/2021  Transportation Screening Complete  PCP or Specialist Appt within 3-5 Days Complete  HRI or Home  Care Consult Complete  Social Work Consult for [place] Planning/Counseling Complete  Palliative Care Screening Complete  Medication Review Press photographer) Complete  Some recent data might be hidden

## 2021-12-14 NOTE — Progress Notes (Addendum)
Daily Progress Note   Patient Name: Carlos Erickson       Date: 12/14/2021 DOB: 08-05-1951  Age: 71 y.o. MRN#: [id number] Attending Physician: Kayleen Memos, DO Primary Care Physician: Owens Loffler, MD Admit Date: 12/10/2021  Reason for Consultation/Follow-up: Establishing goals of care  Patient Profile/HPI:  Carlos Erickson is a 71 y.o. male with medical history significant of NSCLC w/ bone mets, HTN, HLD, CAD s/p CABG. Presenting from home with AMS and dyspnea. History obtained from wife. She said she noticed that he was in a coughing spell this morning. He couldn't seem to catch his breath and he seemed frustrated. He seemed to be in distress, but also confused. She became concerned and called for EMS. She denies any other aggravating or alleviating factors. Work-up revealed bibasilar pneumonia and bilateral pleural effusions right greater than left.  Also revealed metastatic disease worst at T11 with a burst fracture and some retropulsion and some new fracture lines across the pedicle bilaterally from NSCLC metastasis involving bones and liver.   Palliative care has been asked to get involved in the setting of metastatic non-small cell lung cancer to further address goals of care and to aid in symptom management.  Subjective: Chart reviewed.  Carlos Erickson reports pain is better managed. We reviewed his medication list and prn usage. He has not required any prns.  He shared he was worried about his delirium recurring. He went into detail about his experience and how he interpreted staff's responses. He vividly remembers being in restraints and this was quite disturbing to him. No problems last night- he slept well. He is hopeful the delirium and hallucinations will not happen again.  We  discussed his plan of care and goals of care. He is hopeful that having surgery will help allow him to walk around his yard, visit his neighbor, and perhaps go fishing at least on more time.  He has the list of hospices and we reviewed them together. He would like to choose one that has an inpatient hospice facility as he worries that his wife will not be able to care for him as his illness progresses and he gets closer to the end of his life.  We also reviewed his medication list and noted that some medications may just be adding pill burden and not  actually affecting his quality of life or trajectory- for example, he has likely exceeded the benefit of taking a statin at this point.    Review of Systems  Constitutional:  Positive for malaise/fatigue.  Musculoskeletal:  Positive for back pain.    Physical Exam Vitals and nursing note reviewed.  Pulmonary:     Effort: Pulmonary effort is normal.  Neurological:     Mental Status: He is alert and oriented to person, place, and time.            Vital Signs: BP (!) 133/93 (BP Location: Left Arm)    Pulse 99    Temp 97.9 F (36.6 C) (Oral)    Resp 18    Ht 5\' 8"  (1.727 m)    Wt 76.7 kg    SpO2 95%    BMI 25.70 kg/m  SpO2: SpO2: 95 % O2 Device: O2 Device: Nasal Cannula O2 Flow Rate: O2 Flow Rate (L/min): 4 L/min  Intake/output summary:  Intake/Output Summary (Last 24 hours) at 12/14/2021 1232 Last data filed at 12/14/2021 1112 Gross per 24 hour  Intake 240 ml  Output 1550 ml  Net -1310 ml   LBM: Last BM Date : 12/12/21 Baseline Weight: Weight: 76.7 kg Most recent weight: Weight: 76.7 kg       Palliative Assessment/Data: PPS: 50%      Patient Active Problem List   Diagnosis Date Noted   PNA (pneumonia) 12/10/2021   Hyponatremia 12/10/2021   Hypokalemia 12/10/2021   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 ([place]) 11/13/2021   Primary cancer of right lower lobe of lung (HCC)    Bone metastasis (HCC)    Lip numbness 03/16/2020   Mouth ulcer  03/16/2020   Barrett's esophagus 07/04/2016   Peripheral neuropathy 01/05/2015   Hyperlipidemia    Unilateral recurrent femoral hernia without obstruction or gangrene 07/10/2014   Past heart attack, 05/11/2014 07/09/2014   S/P CABG x 2 07/09/2014   CAD (coronary artery disease), native coronary artery 07/09/2014   Hypertension    GERD (gastroesophageal reflux disease)    Arthritis     Palliative Care Assessment & Plan    Assessment/Recommendations/Plan  NSCLC with metastasis- continue current symptom management- no further chemo/radiation, reviewed pain management plan with him, no adjustments needed, he is hopeful that neurosurgery will also improve his pain D/C home with hospice after surgery   Code Status: DNR  Prognosis:  < 6 months  Discharge Planning: Home with Hospice  Care plan was discussed with patient.   Total time: 60 minutes  Thank you for allowing the Palliative Medicine Team to assist in the care of this patient.   Mariana Kaufman, AGNP-C Palliative Medicine   Please contact Palliative Medicine Team phone at [phone] for questions and concerns.

## 2021-12-14 NOTE — Care Management Important Message (Signed)
Important Message  Patient Details  Name: Carlos Erickson MRN: [id number] Date of Birth: [birth year]   Medicare Important Message Given:  Yes     Shelda Altes 12/14/2021, 8:54 AM

## 2021-12-14 NOTE — Progress Notes (Signed)
Discussed w/ pt, he'd like to proceed with surgery. Will tentatively schedule for Thursday morning (2/23), but obviously pending anesthesia evaluation given his pulmonary function.

## 2021-12-15 LAB — CULTURE, BLOOD (ROUTINE X 2)
Culture: NO GROWTH
Culture: NO GROWTH
Special Requests: ADEQUATE

## 2021-12-15 LAB — RENAL FUNCTION PANEL
Albumin: 2.3 g/dL — ABNORMAL LOW (ref 3.5–5.0)
Anion gap: 7 (ref 5–15)
BUN: 19 mg/dL (ref 8–23)
CO2: 33 mmol/L — ABNORMAL HIGH (ref 22–32)
Calcium: 8.9 mg/dL (ref 8.9–10.3)
Chloride: 97 mmol/L — ABNORMAL LOW (ref 98–111)
Creatinine, Ser: 0.68 mg/dL (ref 0.61–1.24)
GFR, Estimated: >60 mL/min (ref >60)
Glucose, Bld: 140 mg/dL — ABNORMAL HIGH (ref 70–99)
Phosphorus: 3.9 mg/dL (ref 2.5–4.6)
Potassium: 5.5 mmol/L — ABNORMAL HIGH (ref 3.5–5.1)
Sodium: 137 mmol/L (ref 135–145)

## 2021-12-15 LAB — SURGICAL PCR SCREEN
MRSA, PCR: NEGATIVE
Staphylococcus aureus: NEGATIVE

## 2021-12-15 LAB — POTASSIUM: Potassium: 5.1 mmol/L (ref 3.5–5.1)

## 2021-12-15 LAB — MAGNESIUM: Magnesium: 2 mg/dL (ref 1.7–2.4)

## 2021-12-15 LAB — LACTIC ACID, PLASMA: Lactic Acid, Venous: 1.5 mmol/L (ref 0.5–1.9)

## 2021-12-15 MED ORDER — MAGIC MOUTHWASH
5.0000 mL | Freq: Four times a day (QID) | ORAL | Status: DC | PRN
  Administered 2021-12-15: 5 mL via ORAL
  Filled 2021-12-15 (×3): qty 5

## 2021-12-15 MED ORDER — MUPIROCIN 2 % EX OINT: 1.0000 "application " | TOPICAL_OINTMENT | Freq: Two times a day (BID) | CUTANEOUS | Status: DC

## 2021-12-15 NOTE — Progress Notes (Addendum)
Manufacturing engineer [facility]) Hospital Liaison Note  Referral received from [facility] for hospice at home. Chart under review by [facility] physician.   Hospice eligibility confirmed.   Plan is to discharge home on Friday.   DME in the home: rolling walker  DME needs: hospital bed, over the bed table, transport wheelchair, O2 with humidification  Ruby is the contact for DME delivery.   Please send patient home with prescriptions/medications for comfort.  Please call with any questions or concerns. Thank you  Roselee Nova, [facility] Liaison [phone]

## 2021-12-15 NOTE — Progress Notes (Signed)
PROGRESS NOTE  Carlos Erickson GQQ:[id number] DOB: 1951-05-23 DOA: 12/10/2021 PCP: Owens Loffler, MD  HPI/Recap of past 24 hours: Carlos Erickson is a 71 y.o. male with medical history significant of NSCLC w/ bone mets, HTN, HLD, CAD s/p CABG who presented from home with AMS, dyspnea, and mid back pain. Work-up revealed bibasilar pneumonia, bilateral pleural effusions right greater than left, metastatic disease worst at T11 with a burst fracture and some retropulsion and some new fracture lines across the pedicle bilaterally from NSCLC metastasis involving bones and liver.  Post right thoracentesis with 400 cc of serosanguineous fluid removed on 12/12/2021 by IR.  Fluid sent for analysis.  Hospital course complicated by agitation delirium 2/19, now resolved, was started on delirium precautions and management.  No recent use of alcohol, last alcohol intake was more than a month ago.  Palliative care team assisting with pain management.  Currently on scheduled narcotics and Decadron with improvement of symptomatology  12/15/2021: Patient was seen and examined at his bedside.  He reports his back pain is 1 out of 10.  He has no new complaints.  Plan for neurosurgery to stabilize his T11 pathological vertebral body fracture and bilateral pedicle fractures.  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 Switched to cefdinir on 12/12/2021. Leukocytosis has resolved.  Afebrile.    Malignant pleural effusion status post right thoracentesis by IR on 12/12/2021. Pleural fluid showed malignant cells consistent with adenocarcinoma  Resolved acute metabolic encephalopathy/agitation delirium in the setting of  acute illness Delirium precautions in place Continue Seroquel 12.5 mg twice daily, trazodone nightly Continue to reorient frequently Continue regulate sleep and wake cycle, out of bed to chair during the day, open blinds during the day. Continue to avoid benzodiazepines  Multiple pathologic fractures of the thoracic and lumbar spine Unstable T11 fracture     - neurosurgery onboard and requested transfer to [facility]; appreciate their assistance, will await final recs     -Continue pain management with scheduled MS Contin.  Decadron added on 12/12/2021 for pain by palliative, continue. Continue lidocaine patch daily. Appreciate palliative care team and neurosurgery's assistance.  Hyperkalemia Hold off home losartan and potassium replacement. Serum potassium 5.1 from 5.5.  Renal function is intact. Continue to closely monitor and treat as indicated.  Hyperglycemia, steroid-induced. Continue to monitor. Avoid overtreating but if continues to uptrend start insulin sign scale. CBG twice daily.  Tachycardia Add TSH Obtain 12-lead EKG. Continue to monitor on telemetry   NSCLC w/ bone and liver mets     - imaging notes mets to liver    HLD     -Continue home Lipitor   CAD     - continue home regimen   HTN     -Continue home aspirin and home Lipitor   Resolved post repletion: Refractory hyponatremia, suspect SIADH. Potassium 3.9.  Resolved post repletion: Hypomagnesemia Magnesium 2.3 from 1.4  Physical debility Continue fall precautions.  Goals of care DNR as of 12/12/2021. Palliative care team following. Plan to discharge to home with hospice care after neurosurgery.  Tentative schedule for Thursday morning 12/16/2021, pending anesthesia evaluation given his pulmonary function.       Advance Care Planning:   Code Status: DNR.  Consults: Oncology, Neurosurgery, palliative care team.    Family Communication: Updated patient's wife in person at bedside.  Updated patient's  son via phone.  Disposition Plan: Likely will discharge home with hospice care once neurosurgery signs off.   Procedures: Right thoracentesis on 12/12/2021 by IR.  Antimicrobials: Cefepime> 12/12/2021. IV vancomycin> 12/11/2021. P.o. cefdinir 12/12/21>>  DVT prophylaxis: SCDs.  Status is: Inpatient Patient requires at least 2 midnights for further evaluation and treatment of present condition.            Objective: Vitals:   12/14/21 2040 12/15/21 0010 12/15/21 0410 12/15/21 1211  BP: (!) 148/91 (!) 134/95 112/76 123/78  Pulse:  72 88 (!) 103  Resp: 18 14 15 20   Temp: 98.3 F (36.8 C) 98.3 F (36.8 C) 98.7 F (37.1 C) 98.6 F (37 C)  TempSrc: Oral Oral Oral Oral  SpO2: 93% 96% 98% 94%  Weight:      Height:        Intake/Output Summary (Last 24 hours) at 12/15/2021 1441 Last data filed at 12/15/2021 0545 Gross per 24 hour  Intake 480 ml  Output 1100 ml  Net -620 ml   Filed Weights   12/10/21 0427  Weight: 76.7 kg    Exam:  General: 71 y.o. year-old male frail-appearing no acute stress.  He is alert and oriented x3.   Cardiovascular: Tachycardic no rubs gallops. Respiratory: Clear to auscultation no wheezes or rales. Abdomen: Soft nontender normal bowel sounds. Musculoskeletal: No lower extremity edema bilaterally. Skin: No ulcerative lesions noted. Psychiatry: Mood is appropriate for condition and setting.   Data Reviewed: CBC: Recent Labs  Lab 12/10/21 0438 12/11/21 0059 12/12/21 0721 12/13/21 0644  WBC 13.5* 10.8* 13.2* 10.5  HGB 11.2* 9.9* 11.1* 10.7*  HCT 32.7* 29.3* 33.1* 32.2*  MCV 86.7 87.5 87.8 89.9  PLT 177 156 212 102   Basic Metabolic Panel: Recent Labs  Lab 12/11/21 0059 12/11/21 0925 12/11/21 1501 12/13/21 0644 12/15/21 0322 12/15/21 0615  NA 128* 129* 130* 138 137  --   K 3.4* 3.6 3.9 4.7 5.5* 5.1  CL 96* 95* 97* 98 97*  --   CO2 22 24 24 30  33*  --   GLUCOSE 110* 128* 126* 151* 140*  --   BUN 15 14 12 18 19   --    CREATININE 0.64 0.60* 0.57* 0.86 0.68  --   CALCIUM 8.6* 8.6* 8.7* 9.2 8.9  --   MG 1.5* 1.4*  --  2.3 2.0  --   PHOS 5.2* 4.0 4.1 4.3 3.9  --    GFR: Estimated Creatinine Clearance: 81.9 mL/min (by C-G formula based on SCr of 0.68 mg/dL). Liver Function Tests: Recent Labs  Lab 12/10/21 0438 12/11/21 0059 12/11/21 0925 12/11/21 1501 12/15/21 0322  AST 23  --   --   --   --   ALT 25  --   --   --   --   ALKPHOS 141*  --   --   --   --   BILITOT 1.0  --   --   --   --   PROT 6.5  --   --   --   --   ALBUMIN 2.6* 2.0* 2.1* 2.2* 2.3*   No results for input(s): LIPASE, AMYLASE in the last 168 hours. No results for input(s): AMMONIA in the last 168 hours. Coagulation Profile: Recent Labs  Lab 12/10/21 0438 12/11/21 0059  INR 1.1 1.2   Cardiac Enzymes: No results  for input(s): CKTOTAL, CKMB, CKMBINDEX, TROPONINI in the last 168 hours. BNP (last 3 results) No results for input(s): PROBNP in the last 8760 hours. HbA1C: No results for input(s): HGBA1C in the last 72 hours. CBG: Recent Labs  Lab 12/10/21 0438  GLUCAP 133*   Lipid Profile: No results for input(s): CHOL, HDL, LDLCALC, TRIG, CHOLHDL, LDLDIRECT in the last 72 hours. Thyroid Function Tests: No results for input(s): TSH, T4TOTAL, FREET4, T3FREE, THYROIDAB in the last 72 hours. Anemia Panel: No results for input(s): VITAMINB12, FOLATE, FERRITIN, TIBC, IRON, RETICCTPCT in the last 72 hours. Urine analysis:    Component Value Date/Time   COLORURINE YELLOW 12/10/2021 0438   APPEARANCEUR CLOUDY (A) 12/10/2021 0438   LABSPEC 1.012 12/10/2021 0438   PHURINE 7.0 12/10/2021 0438   GLUCOSEU NEGATIVE 12/10/2021 0438   HGBUR NEGATIVE 12/10/2021 0438   BILIRUBINUR NEGATIVE 12/10/2021 0438   KETONESUR 5 (A) 12/10/2021 0438   PROTEINUR NEGATIVE 12/10/2021 0438   NITRITE NEGATIVE 12/10/2021 0438   LEUKOCYTESUR NEGATIVE 12/10/2021 0438   Sepsis Labs: @LABRCNTIP (procalcitonin:4,lacticidven:4)  ) Recent Results  (from the past 240 hour(s))  Resp Panel by RT-PCR (Flu A&B, Covid) Nasopharyngeal Swab     Status: None   Collection Time: 12/10/21  4:38 AM   Specimen: Nasopharyngeal Swab; Nasopharyngeal(NP) swabs in vial transport medium  Result Value Ref Range Status   SARS Coronavirus 2 by RT PCR NEGATIVE NEGATIVE Final    Comment: (NOTE) SARS-CoV-2 target nucleic acids are NOT DETECTED.  The SARS-CoV-2 RNA is generally detectable in upper respiratory specimens during the acute phase of infection. The lowest concentration of SARS-CoV-2 viral copies this assay can detect is 138 copies/mL. A negative result does not preclude SARS-Cov-2 infection and should not be used as the sole basis for treatment or other patient management decisions. A negative result may occur with  improper specimen collection/handling, submission of specimen other than nasopharyngeal swab, presence of viral mutation(s) within the areas targeted by this assay, and inadequate number of viral copies(<138 copies/mL). A negative result must be combined with clinical observations, patient history, and epidemiological information. The expected result is Negative.  Fact Sheet for Patients:  EntrepreneurPulse.com.au  Fact Sheet for Healthcare Providers:  IncredibleEmployment.be  This test is no t yet approved or cleared by the [place] FDA and  has been authorized for detection and/or diagnosis of SARS-CoV-2 by FDA under an Emergency Use Authorization (EUA). This EUA will remain  in effect (meaning this test can be used) for the duration of the COVID-19 declaration under Section 564(b)(1) of the Act, 21 U.S.C.section 360bbb-3(b)(1), unless the authorization is terminated  or revoked sooner.       Influenza A by PCR NEGATIVE NEGATIVE Final   Influenza B by PCR NEGATIVE NEGATIVE Final    Comment: (NOTE) The Xpert Xpress SARS-CoV-2/FLU/RSV plus assay is intended as an aid in the  diagnosis of influenza from Nasopharyngeal swab specimens and should not be used as a sole basis for treatment. Nasal washings and aspirates are unacceptable for Xpert Xpress SARS-CoV-2/FLU/RSV testing.  Fact Sheet for Patients: EntrepreneurPulse.com.au  Fact Sheet for Healthcare Providers: IncredibleEmployment.be  This test is not yet approved or cleared by the [place] FDA and has been authorized for detection and/or diagnosis of SARS-CoV-2 by FDA under an Emergency Use Authorization (EUA). This EUA will remain in effect (meaning this test can be used) for the duration of the COVID-19 declaration under Section 564(b)(1) of the Act, 21 U.S.C. section 360bbb-3(b)(1), unless the authorization is terminated or  revoked.  Performed at [facility], [place] [address], [place], [place] [address]   Blood Culture (routine x 2)     Status: None   Collection Time: 12/10/21  4:38 AM   Specimen: BLOOD  Result Value Ref Range Status   Specimen Description   Final    BLOOD BLOOD LEFT FOREARM Performed at [facility] [address], [place], [place] [address]    Special Requests   Final    BOTTLES DRAWN AEROBIC AND ANAEROBIC Blood Culture adequate volume Performed at [facility] [address] [place], [place] [address]    Culture   Final    NO GROWTH 5 DAYS Performed at [facility], [place] [address], [place], [place] [address]    Report Status 12/15/2021 FINAL  Final  Urine Culture     Status: None   Collection Time: 12/10/21  4:38 AM   Specimen: In/Out Cath Urine  Result Value Ref Range Status   Specimen Description   Final    IN/OUT CATH URINE Performed at [facility] [address], [place], [place] [address]    Special Requests   Final    NONE Performed at [facility], [place] [address], [place], [place] [address]    Culture   Final     NO GROWTH Performed at [facility], [place] [address] [place], [place] [address]    Report Status 12/11/2021 FINAL  Final  Blood Culture (routine x 2)     Status: None   Collection Time: 12/10/21  8:24 AM   Specimen: BLOOD RIGHT HAND  Result Value Ref Range Status   Specimen Description   Final    BLOOD RIGHT HAND Performed at [facility] [address], [place], [place] [address]    Special Requests   Final    BOTTLES DRAWN AEROBIC AND ANAEROBIC Blood Culture results may not be optimal due to an excessive volume of blood received in culture bottles Performed at [facility] [address], [place], [place] [address]    Culture   Final    NO GROWTH 5 DAYS Performed at [facility], [place] [address] [place], [place] [address]    Report Status 12/15/2021 FINAL  Final  MRSA Next Gen by PCR, Nasal     Status: None   Collection Time: 12/10/21  6:55 PM   Specimen: Nasal Mucosa; Nasal Swab  Result Value Ref Range Status   MRSA by PCR Next Gen NOT DETECTED NOT DETECTED Final    Comment: (NOTE) The GeneXpert MRSA Assay (FDA approved for NASAL specimens only), is one component of a comprehensive MRSA colonization surveillance program. It is not intended to diagnose MRSA infection nor to guide or monitor treatment for MRSA infections. Test performance is not FDA approved in patients less than 22 years old. Performed at [facility], [place] [address], [place],  [address]   Surgical PCR screen     Status: None   Collection Time: 12/15/21 10:59 AM   Specimen: Nasal Mucosa; Nasal Swab  Result Value Ref Range Status   MRSA, PCR NEGATIVE NEGATIVE Final   Staphylococcus aureus NEGATIVE NEGATIVE Final    Comment: (NOTE) The Xpert SA Assay (FDA approved for NASAL specimens in patients 74 years of age and older), is one component of a comprehensive surveillance program. It is not intended to diagnose infection nor to guide or  monitor  treatment. Performed at [facility], [place] [address] [place], [place] [address]       Studies: No results found.  Scheduled Meds:  amLODipine  10 mg Oral Daily   aspirin EC  81 mg Oral Daily   calcitonin (salmon)  1 spray Alternating Nares Daily   carvedilol  6.25 mg Oral BID WC   feeding supplement  237 mL Oral TID BM   gabapentin  300 mg Oral TID   lidocaine  1 patch Transdermal Q24H   melatonin  3 mg Oral QHS   morphine  15 mg Oral Q12H   mupirocin ointment  1 application Nasal BID   pantoprazole  40 mg Oral Daily   polyethylene glycol  17 g Oral BID   QUEtiapine  12.5 mg Oral BID   senna  2 tablet Oral QHS   tamsulosin  0.4 mg Oral QPC supper   traZODone  50 mg Oral QHS    Continuous Infusions:     LOS: 5 days     Kayleen Memos, MD [facility] Pager [phone]  If 7PM-7AM, please contact night-coverage [email or web address] Password [facility] 12/15/2021, 2:41 PM

## 2021-12-15 NOTE — Discharge Planning (Signed)
Oncology Discharge Planning Note  [facility] Address: [place], [place], [place] [address] Hours of Operation:  [place] Polio, Monday - Friday  Clinic Contact Information:  [phone]  Oncology Care Team: Medical Oncologist:  Dr. Truitt Merle  Patient Details: Name:  Carlos, Erickson MRN:   [id number] DOB:   10-Nov-1950 Reason for Current Admission: PNA (pneumonia)  Discharge Planning Narrative: Post discharge plan of care for the outpatient setting is: Home with in home [place] will be called within two business days after discharge to review hematology/oncology's plan of care for full understanding.

## 2021-12-15 NOTE — Progress Notes (Signed)
Neurosurgery Service Progress Note  Subjective: No acute events overnight, no new complaints today, stable severe back pain, no new radicular complaints   Objective: Vitals:   12/14/21 1602 12/14/21 2040 12/15/21 0010 12/15/21 0410  BP: (!) 150/93 (!) 148/91 (!) 134/95 112/76  Pulse:   72 88  Resp:  18 14 15   Temp: 98 F (36.7 C) 98.3 F (36.8 C) 98.3 F (36.8 C) 98.7 F (37.1 C)  TempSrc: Oral Oral Oral Oral  SpO2:  93% 96% 98%  Weight:      Height:        Physical Exam: Strength 5/5 x4 and SILTx4 but pain limited proximally in BLE, sat'ing in the high 90s on 4L humidified [place]  Assessment & Plan: 71 y.o. man w/ metastatic NSCLC and refractory LBP making him bedbound, new T11 pathologic vertebral body fracture with bilateral pedicle fractures.  -discussed with the pt, again will be up to anesthesia eval, but plan is to proceed with perc instrumentation tomorrow to stabilize his fracture to improve his pain, will discuss with family further as well -NPO after midnight, [id number] okay for perc screws, hold anticoagulants  Judith Part  12/15/21 9:10 AM

## 2021-12-15 NOTE — Progress Notes (Signed)
Nutrition Follow-up  DOCUMENTATION CODES:  Severe malnutrition in context of chronic illness  INTERVENTION:  Continue current diet as ordered, encouraged PO. Consider puree diet if pt agreeable after surgery Continue Ensure Enlive po TID, each supplement provides 350 kcal and 20 grams of protein. MVI with minerals Magic mouthwash to be used as needed for dry mouth  NUTRITION DIAGNOSIS:  Severe Malnutrition (in the context of chronic illness (cancer)) related to mouth pain, decreased appetite as evidenced by percent weight loss, severe fat depletion, severe muscle depletion (12.3% x 4 months) - remains valid  GOAL:  Patient will meet greater than or equal to 90% of their needs - supplements in place  MONITOR:  PO intake, Weight trends, Supplement acceptance, Labs  REASON FOR ASSESSMENT:  Consult Assessment of nutrition requirement/status  ASSESSMENT:  71 y.o. male presented to the ED with AMS and dyspnea. PMH includes HTN, CABG, GERD, NSCLC w/ bone mets. Pt admitted with pneumonia and unstable fracture of T11.  Pt and family working with palliative care team this admission as new metastasis were seen on imaging. Pt has decided he does not want to pursue anymore radiation or chemo. Pt plans to undergo surgery to stabilize his fracture and help with pain control and then discharge home under hospice care.   Pt resting in bed at the time of assessment. Inquired about recent nutrition status. Pt reports that he has been suffering from dry mouth for some time now. Reports that radiation therapy worsened the sensation and he also believes his prescribed morphine has exacerbated the dryness. Has not been using a mouth rinse at home for this issue. Discussed with MD, will add magic mouthwash. Pt was advised to try mouthwash to see if it aided in swallowing.  Reports that he is finding it hard to swallow items that don't stay in a "blob" shape when swallowed. Meats and some vegetables such as  broccoli seem to be particularly difficult. Pt reports he has mostly been eating items like pudding, mashed potatoes,and ensure. May benefit from a puree diet if agreeable to enhance daily nutrition intake.  During discussion of dry mouth, pt also stated his mouth was "puckered" evaluated and tongue appeared fissured. May be worth assessing for vitamin deficiencies. Noted that many vitamins were assessed in 02/2020 and fell WNL. None have be assessed since that time.   Pt also endorses weight loss and poor energy levels. Reviewed weight hx and it appears that pt has lost 12.3% of body weight over the last 4 months (10/5-2/17) which is severe. Significant muscle and fat deficits seen on exam.   Nutritionally Relevant Medications: Scheduled Meds:  calcitonin (salmon)  1 spray Alternating Nares Daily   feeding supplement  237 mL Oral TID BM   pantoprazole  40 mg Oral Daily   polyethylene glycol  17 g Oral BID   PRN Meds: ondansetron  Labs Reviewed: Chloride 97   NUTRITION - FOCUSED PHYSICAL EXAM: Flowsheet Row Most Recent Value  Orbital Region Moderate depletion  Upper Arm Region Severe depletion  Thoracic and Lumbar Region Mild depletion  Buccal Region Moderate depletion  Temple Region Mild depletion  Clavicle Bone Region Mild depletion  Clavicle and Acromion Bone Region No depletion  Scapular Bone Region No depletion  Dorsal Hand Severe depletion  Patellar Region Severe depletion  Anterior Thigh Region Severe depletion  Posterior Calf Region Severe depletion  Edema (RD Assessment) None  Hair Reviewed  Eyes Reviewed  Mouth Reviewed  [dry mouth, fissured tongue]  Skin  Reviewed  Nails Reviewed   Diet Order:   Diet Order             Diet NPO time specified  Diet effective midnight           Diet NPO time specified  Diet effective midnight           Diet regular Room service appropriate? Yes; Fluid consistency: Thin  Diet effective now                   EDUCATION  NEEDS:  Education needs have been addressed  Skin:  Skin Assessment: Reviewed RN Assessment  Last BM:  2/19  Height:  Ht Readings from Last 1 Encounters:  12/10/21 5\' 8"  (1.727 m)    Weight:  Wt Readings from Last 1 Encounters:  12/10/21 76.7 kg    Ideal Body Weight:  70 kg  BMI:  Body mass index is 25.7 kg/m.  Estimated Nutritional Needs:  Kcal:  2300-2500 Protein:  115-130 grams Fluid:  >/= 2.3 L   Ranell Patrick, RD, LDN Clinical Dietitian RD pager # available in AMION  After hours/weekend pager # available in [facility]

## 2021-12-16 ENCOUNTER — Inpatient Hospital Stay (HOSPITAL_COMMUNITY): Payer: Medicare Other

## 2021-12-16 ENCOUNTER — Inpatient Hospital Stay (HOSPITAL_COMMUNITY): Payer: Medicare Other | Admitting: Anesthesiology

## 2021-12-16 ENCOUNTER — Encounter (HOSPITAL_COMMUNITY): Payer: Self-pay | Admitting: Internal Medicine

## 2021-12-16 ENCOUNTER — Other Ambulatory Visit: Payer: Self-pay

## 2021-12-16 ENCOUNTER — Encounter (HOSPITAL_COMMUNITY)
Admission: EM | Disposition: A | Discharge status: Hospice | Payer: Self-pay | Source: Home / Self Care | Attending: Internal Medicine

## 2021-12-16 DIAGNOSIS — I2510 Atherosclerotic heart disease of native coronary artery without angina pectoris: Secondary | ICD-10-CM

## 2021-12-16 DIAGNOSIS — E43 Unspecified severe protein-calorie malnutrition: Secondary | ICD-10-CM | POA: Insufficient documentation

## 2021-12-16 DIAGNOSIS — Z951 Presence of aortocoronary bypass graft: Secondary | ICD-10-CM

## 2021-12-16 DIAGNOSIS — M8448XA Pathological fracture, other site, initial encounter for fracture: Secondary | ICD-10-CM

## 2021-12-16 DIAGNOSIS — I10 Essential (primary) hypertension: Secondary | ICD-10-CM

## 2021-12-16 LAB — RENAL FUNCTION PANEL
Albumin: 2.3 g/dL — ABNORMAL LOW (ref 3.5–5.0)
Anion gap: 8 (ref 5–15)
BUN: 25 mg/dL — ABNORMAL HIGH (ref 8–23)
CO2: 30 mmol/L (ref 22–32)
Calcium: 9.1 mg/dL (ref 8.9–10.3)
Chloride: 93 mmol/L — ABNORMAL LOW (ref 98–111)
Creatinine, Ser: 0.61 mg/dL (ref 0.61–1.24)
GFR, Estimated: >60 mL/min (ref >60)
Glucose, Bld: 141 mg/dL — ABNORMAL HIGH (ref 70–99)
Phosphorus: 3.9 mg/dL (ref 2.5–4.6)
Potassium: 4.6 mmol/L (ref 3.5–5.1)
Sodium: 131 mmol/L — ABNORMAL LOW (ref 135–145)

## 2021-12-16 LAB — MAGNESIUM: Magnesium: 1.8 mg/dL (ref 1.7–2.4)

## 2021-12-16 LAB — CBC
HCT: 33.6 % — ABNORMAL LOW (ref 39.0–52.0)
Hemoglobin: 11.3 g/dL — ABNORMAL LOW (ref 13.0–17.0)
MCH: 29.9 pg (ref 26.0–34.0)
MCHC: 33.6 g/dL (ref 30.0–36.0)
MCV: 88.9 fL (ref 80.0–100.0)
Platelets: 245 10*3/uL (ref 150–400)
RBC: 3.78 MIL/uL — ABNORMAL LOW (ref 4.22–5.81)
RDW: 13.6 % (ref 11.5–15.5)
WBC: 12.7 10*3/uL — ABNORMAL HIGH (ref 4.0–10.5)
nRBC: 2.2 % — ABNORMAL HIGH (ref 0.0–0.2)

## 2021-12-16 LAB — TYPE AND SCREEN
ABO/RH(D): O POS
Antibody Screen: NEGATIVE

## 2021-12-16 LAB — PROTIME-INR
INR: 1.2 (ref 0.8–1.2)
Prothrombin Time: 14.9 seconds (ref 11.4–15.2)

## 2021-12-16 LAB — TSH: TSH: 1.32 u[IU]/mL (ref 0.350–4.500)

## 2021-12-16 IMAGING — RF DG THORACOLUMBAR SPINE 2V
1 series · 2 of 2 positions shown · non-contrast
Comparison: None.

CLINICAL DATA: Posterior spinal fusion.

EXAM:
THORACOLUMBAR SPINE 1V

[Series 1: run · 2 of 2 slices shown]
[im 1/2]
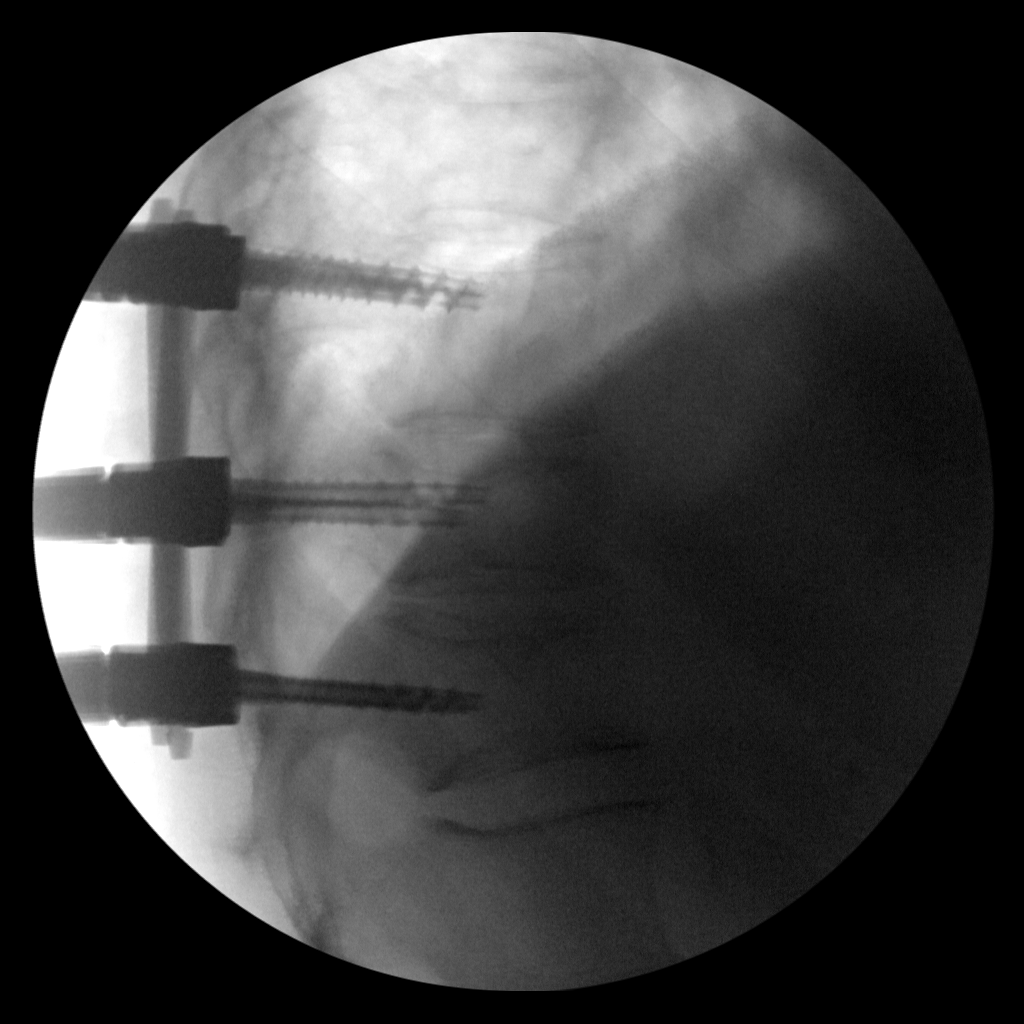
[im 2/2]
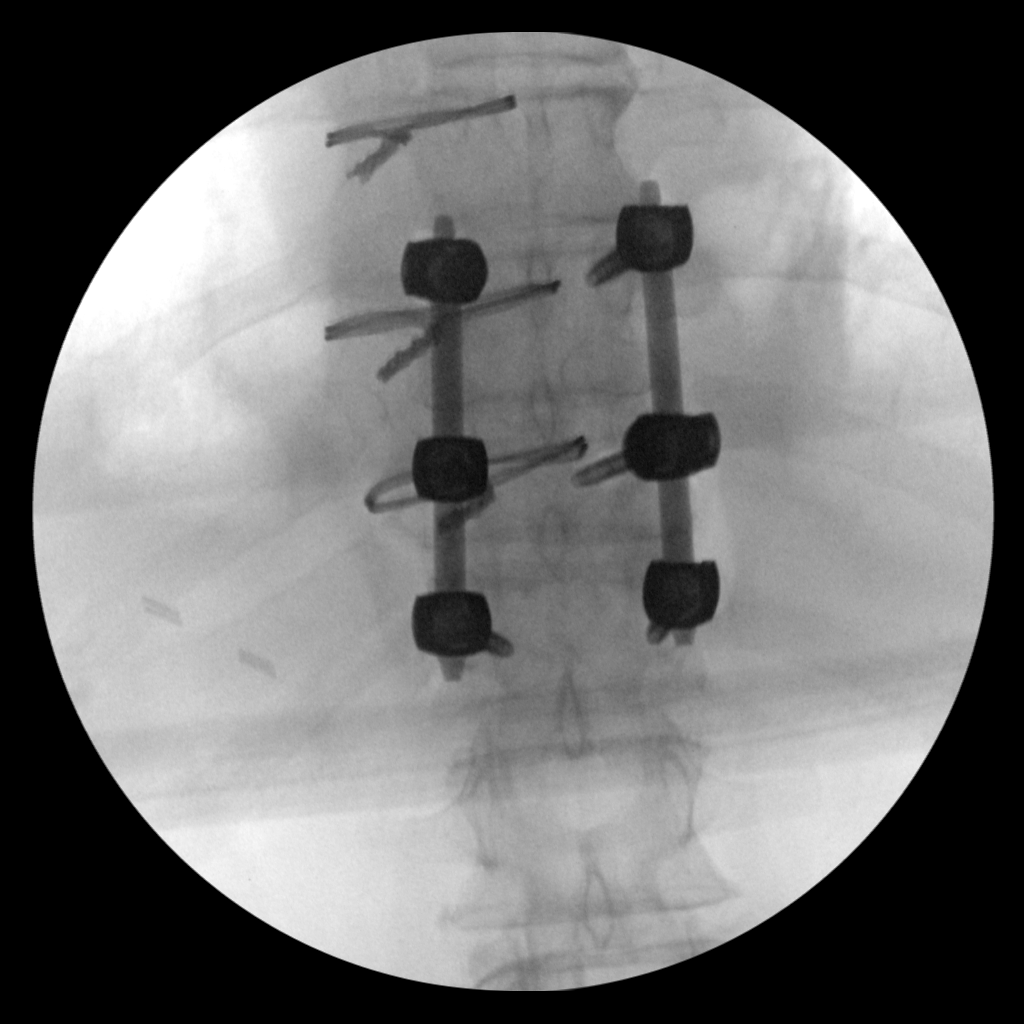

[2 of 2 positions shown; findings below may reference images not displayed]

FINDINGS: Multiple intraoperative fluoroscopic spot images are provided.
Posterior spinal fusion from T10 through T12. Compression fractures
of the T11 and T12 vertebral bodies.

FLUOROSCOPY TIME:  Fluoroscopy Time:  123.4 seconds

Radiation Exposure Index (if provided by the fluoroscopic device):
75.01 mGy

Number of Acquired Spot Images: 0
IMPRESSION: 1. Intraoperative fluoroscopic spot images demonstrating posterior
spinal fusion from T10 through T12.

## 2021-12-16 SURGERY — POSTERIOR LUMBAR FUSION 2 LEVEL
Anesthesia: General | Site: Spine Thoracic

## 2021-12-16 MED ORDER — LIDOCAINE 2% (20 MG/ML) 5 ML SYRINGE
INTRAMUSCULAR | Status: DC | PRN
  Administered 2021-12-16: 40 mg via INTRAVENOUS

## 2021-12-16 MED ORDER — PHENYLEPHRINE 40 MCG/ML (10ML) SYRINGE FOR IV PUSH (FOR BLOOD PRESSURE SUPPORT)
PREFILLED_SYRINGE | INTRAVENOUS | Status: DC | PRN
  Administered 2021-12-16: 160 ug via INTRAVENOUS
  Administered 2021-12-16: 240 ug via INTRAVENOUS

## 2021-12-16 MED ORDER — AMISULPRIDE (ANTIEMETIC) 5 MG/2ML IV SOLN: 10.0000 mg | Freq: Once | INTRAVENOUS | Status: DC | PRN

## 2021-12-16 MED ORDER — GELATIN ABSORBABLE MT POWD
OROMUCOSAL | Status: DC | PRN
  Administered 2021-12-16: 5 mL via TOPICAL

## 2021-12-16 MED ORDER — CEFAZOLIN SODIUM-DEXTROSE 2-4 GM/100ML-% IV SOLN
2.0000 g | INTRAVENOUS | Status: AC
  Administered 2021-12-16: 2 g via INTRAVENOUS

## 2021-12-16 MED ORDER — EPHEDRINE SULFATE-NACL 50-0.9 MG/10ML-% IV SOSY
PREFILLED_SYRINGE | INTRAVENOUS | Status: DC | PRN
  Administered 2021-12-16: 5 mg via INTRAVENOUS

## 2021-12-16 MED ORDER — ONDANSETRON HCL 4 MG/2ML IJ SOLN
INTRAMUSCULAR | Status: DC | PRN
  Administered 2021-12-16: 4 mg via INTRAVENOUS

## 2021-12-16 MED ORDER — VASOPRESSIN 20 UNIT/ML IV SOLN
INTRAVENOUS | Status: AC
  Filled 2021-12-16: qty 1

## 2021-12-16 MED ORDER — ROCURONIUM BROMIDE 10 MG/ML (PF) SYRINGE
PREFILLED_SYRINGE | INTRAVENOUS | Status: DC | PRN
  Administered 2021-12-16: 60 mg via INTRAVENOUS
  Administered 2021-12-16 (×2): 20 mg via INTRAVENOUS

## 2021-12-16 MED ORDER — LIDOCAINE-EPINEPHRINE 1 %-1:100000 IJ SOLN
INTRAMUSCULAR | Status: DC | PRN
  Administered 2021-12-16: 5 mL

## 2021-12-16 MED ORDER — THROMBIN 5000 UNITS EX SOLR
CUTANEOUS | Status: AC
  Filled 2021-12-16: qty 5000

## 2021-12-16 MED ORDER — CHLORHEXIDINE GLUCONATE CLOTH 2 % EX PADS
6.0000 | MEDICATED_PAD | Freq: Every day | CUTANEOUS | Status: DC
  Administered 2021-12-16: 6 via TOPICAL

## 2021-12-16 MED ORDER — DEXAMETHASONE SODIUM PHOSPHATE 10 MG/ML IJ SOLN
INTRAMUSCULAR | Status: DC | PRN
  Administered 2021-12-16: 10 mg via INTRAVENOUS

## 2021-12-16 MED ORDER — PHENYLEPHRINE HCL-NACL 20-0.9 MG/250ML-% IV SOLN
INTRAVENOUS | Status: DC | PRN
  Administered 2021-12-16: 100 ug/min via INTRAVENOUS

## 2021-12-16 MED ORDER — SUGAMMADEX SODIUM 200 MG/2ML IV SOLN
INTRAVENOUS | Status: DC | PRN
Start: 2021-12-16 — End: 2021-12-16
  Administered 2021-12-16: 350 mg via INTRAVENOUS

## 2021-12-16 MED ORDER — METOPROLOL TARTRATE 5 MG/5ML IV SOLN
INTRAVENOUS | Status: DC | PRN
  Administered 2021-12-16: 1 mg via INTRAVENOUS

## 2021-12-16 MED ORDER — FENTANYL CITRATE (PF) 100 MCG/2ML IJ SOLN
25.0000 ug | INTRAMUSCULAR | Status: DC | PRN
  Administered 2021-12-16: 25 ug via INTRAVENOUS
  Administered 2021-12-16: 50 ug via INTRAVENOUS
  Administered 2021-12-16: 25 ug via INTRAVENOUS

## 2021-12-16 MED ORDER — LACTATED RINGERS IV SOLN: INTRAVENOUS | Status: DC | PRN

## 2021-12-16 MED ORDER — PROPOFOL 10 MG/ML IV BOLUS
INTRAVENOUS | Status: AC
  Filled 2021-12-16: qty 20

## 2021-12-16 MED ORDER — OXYCODONE HCL 5 MG PO TABS: 5.0000 mg | ORAL_TABLET | Freq: Once | ORAL | Status: DC | PRN

## 2021-12-16 MED ORDER — BUPIVACAINE HCL (PF) 0.5 % IJ SOLN
INTRAMUSCULAR | Status: AC
  Filled 2021-12-16: qty 30

## 2021-12-16 MED ORDER — PROPOFOL 10 MG/ML IV BOLUS
INTRAVENOUS | Status: DC | PRN
  Administered 2021-12-16: 120 mg via INTRAVENOUS

## 2021-12-16 MED ORDER — ACETAMINOPHEN 10 MG/ML IV SOLN: 1000.0000 mg | Freq: Once | INTRAVENOUS | Status: DC | PRN

## 2021-12-16 MED ORDER — 0.9 % SODIUM CHLORIDE (POUR BTL) OPTIME
TOPICAL | Status: DC | PRN
  Administered 2021-12-16: 1000 mL

## 2021-12-16 MED ORDER — FENTANYL CITRATE (PF) 100 MCG/2ML IJ SOLN
INTRAMUSCULAR | Status: AC
  Filled 2021-12-16: qty 2

## 2021-12-16 MED ORDER — BUPIVACAINE HCL (PF) 0.5 % IJ SOLN
INTRAMUSCULAR | Status: DC | PRN
  Administered 2021-12-16: 5 mL

## 2021-12-16 MED ORDER — ACETAMINOPHEN 325 MG PO TABS: 325.0000 mg | ORAL_TABLET | ORAL | Status: DC | PRN

## 2021-12-16 MED ORDER — HYDROMORPHONE HCL 1 MG/ML IJ SOLN
1.0000 mg | INTRAMUSCULAR | Status: DC | PRN
  Administered 2021-12-16 – 2021-12-17 (×4): 1 mg via INTRAVENOUS
  Filled 2021-12-16 (×4): qty 1

## 2021-12-16 MED ORDER — ACETAMINOPHEN 160 MG/5ML PO SOLN: 325.0000 mg | ORAL | Status: DC | PRN

## 2021-12-16 MED ORDER — FENTANYL CITRATE (PF) 250 MCG/5ML IJ SOLN
INTRAMUSCULAR | Status: AC
  Filled 2021-12-16: qty 5

## 2021-12-16 MED ORDER — FENTANYL CITRATE (PF) 250 MCG/5ML IJ SOLN
INTRAMUSCULAR | Status: DC | PRN
  Administered 2021-12-16: 100 ug via INTRAVENOUS
  Administered 2021-12-16 (×3): 50 ug via INTRAVENOUS

## 2021-12-16 MED ORDER — LIDOCAINE-EPINEPHRINE 1 %-1:100000 IJ SOLN
INTRAMUSCULAR | Status: AC
  Filled 2021-12-16: qty 1

## 2021-12-16 MED ORDER — OXYCODONE HCL 5 MG/5ML PO SOLN: 5.0000 mg | Freq: Once | ORAL | Status: DC | PRN

## 2021-12-16 MED ORDER — MIDAZOLAM HCL 2 MG/2ML IJ SOLN
INTRAMUSCULAR | Status: AC
Start: 2021-12-16 — End: ?
  Filled 2021-12-16: qty 2

## 2021-12-16 SURGICAL SUPPLY — 72 items
ADH SKN CLS APL DERMABOND .7 (GAUZE/BANDAGES/DRESSINGS) ×1
APL SKNCLS STERI-STRIP NONHPOA (GAUZE/BANDAGES/DRESSINGS)
BAG COUNTER SPONGE SURGICOUNT (BAG) ×3 IMPLANT
BAG SPNG CNTER NS LX DISP (BAG) ×1
BASKET BONE COLLECTION (BASKET) ×3 IMPLANT
BENZOIN TINCTURE PRP APPL 2/3 (GAUZE/BANDAGES/DRESSINGS) IMPLANT
BLADE CLIPPER SURG (BLADE) IMPLANT
BLADE SURG 11 STRL SS (BLADE) ×3 IMPLANT
BUR MATCHSTICK NEURO 3.0 LAGG (BURR) ×2 IMPLANT
BUR PRECISION FLUTE 5.0 (BURR) ×2 IMPLANT
CANISTER SUCT 3000ML PPV (MISCELLANEOUS) ×3 IMPLANT
CNTNR URN SCR LID CUP LEK RST (MISCELLANEOUS) ×2 IMPLANT
CONT SPEC 4OZ STRL OR WHT (MISCELLANEOUS) ×2
COVER BACK TABLE 60X90IN (DRAPES) ×3 IMPLANT
DECANTER SPIKE VIAL GLASS SM (MISCELLANEOUS) ×3 IMPLANT
DERMABOND ADVANCED (GAUZE/BANDAGES/DRESSINGS) ×1
DERMABOND ADVANCED .7 DNX12 (GAUZE/BANDAGES/DRESSINGS) ×2 IMPLANT
DRAPE C-ARM 42X72 X-RAY (DRAPES) ×1 IMPLANT
DRAPE C-ARMOR (DRAPES) ×1 IMPLANT
DRAPE LAPAROTOMY 100X72X124 (DRAPES) ×3 IMPLANT
DRAPE SURG 17X23 STRL (DRAPES) ×3 IMPLANT
DURAPREP 26ML APPLICATOR (WOUND CARE) ×3 IMPLANT
ELECT REM PT RETURN 9FT ADLT (ELECTROSURGICAL) ×2
ELECTRODE REM PT RTRN 9FT ADLT (ELECTROSURGICAL) ×2 IMPLANT
EXTENDER TAB GUIDE SV 5.5/6.0 (INSTRUMENTS) ×12 IMPLANT
GAUZE 4X4 16PLY ~~LOC~~+RFID DBL (SPONGE) ×1 IMPLANT
GAUZE SPONGE 4X4 12PLY STRL (GAUZE/BANDAGES/DRESSINGS) IMPLANT
GLOVE EXAM NITRILE LRG STRL (GLOVE) IMPLANT
GLOVE EXAM NITRILE XL STR (GLOVE) IMPLANT
GLOVE EXAM NITRILE XS STR PU (GLOVE) IMPLANT
GLOVE SURG LTX SZ7.5 (GLOVE) ×6 IMPLANT
GLOVE SURG UNDER POLY LF SZ6.5 (GLOVE) ×2 IMPLANT
GLOVE SURG UNDER POLY LF SZ7 (GLOVE) ×3 IMPLANT
GLOVE SURG UNDER POLY LF SZ7.5 (GLOVE) ×6 IMPLANT
GOWN STRL REUS W/ TWL LRG LVL3 (GOWN DISPOSABLE) ×8 IMPLANT
GOWN STRL REUS W/ TWL XL LVL3 (GOWN DISPOSABLE) IMPLANT
GOWN STRL REUS W/TWL 2XL LVL3 (GOWN DISPOSABLE) IMPLANT
GOWN STRL REUS W/TWL LRG LVL3 (GOWN DISPOSABLE) ×8
GOWN STRL REUS W/TWL XL LVL3 (GOWN DISPOSABLE) ×2
GUIDEWIRE BLUNT NT 450 (WIRE) ×6 IMPLANT
HEMOSTAT POWDER KIT SURGIFOAM (HEMOSTASIS) ×3 IMPLANT
KIT BASIN OR (CUSTOM PROCEDURE TRAY) ×3 IMPLANT
KIT POSITION SURG JACKSON T1 (MISCELLANEOUS) ×3 IMPLANT
KIT TURNOVER KIT B (KITS) ×3 IMPLANT
MILL MEDIUM DISP (BLADE) ×2 IMPLANT
NDL BEVEL TWO-PAK W/1PK (NEEDLE) IMPLANT
NDL HYPO 18GX1.5 BLUNT FILL (NEEDLE) IMPLANT
NDL SPNL 18GX3.5 QUINCKE PK (NEEDLE) IMPLANT
NEEDLE BEVEL TWO-PAK W/1PK (NEEDLE) ×2 IMPLANT
NEEDLE HYPO 18GX1.5 BLUNT FILL (NEEDLE) IMPLANT
NEEDLE HYPO 22GX1.5 SAFETY (NEEDLE) ×3 IMPLANT
NEEDLE SPNL 18GX3.5 QUINCKE PK (NEEDLE) IMPLANT
NS IRRIG 1000ML POUR BTL (IV SOLUTION) ×3 IMPLANT
PACK LAMINECTOMY NEURO (CUSTOM PROCEDURE TRAY) ×3 IMPLANT
PAD ARMBOARD 7.5X6 YLW CONV (MISCELLANEOUS) ×9 IMPLANT
ROD SPINAL 5.5X70 TI AL STRT (Rod) ×2 IMPLANT
SCREW MA FENS 4.5X35 (Screw) ×4 IMPLANT
SCREW MA FENS 5.5X35 (Screw) ×2 IMPLANT
SCREW SET 5.5/6.0MM SOLERA (Screw) ×6 IMPLANT
SPONGE SURGIFOAM ABS GEL 100 (HEMOSTASIS) IMPLANT
SPONGE T-LAP 4X18 ~~LOC~~+RFID (SPONGE) ×1 IMPLANT
STRIP CLOSURE SKIN 1/2X4 (GAUZE/BANDAGES/DRESSINGS) IMPLANT
SUT MNCRL AB 3-0 PS2 18 (SUTURE) ×3 IMPLANT
SUT VIC AB 0 CT1 18XCR BRD8 (SUTURE) ×2 IMPLANT
SUT VIC AB 0 CT1 8-18 (SUTURE) ×2
SUT VIC AB 2-0 CP2 18 (SUTURE) ×3 IMPLANT
SYR 3ML LL SCALE MARK (SYRINGE) IMPLANT
TOWEL GREEN STERILE (TOWEL DISPOSABLE) ×3 IMPLANT
TOWEL GREEN STERILE FF (TOWEL DISPOSABLE) ×3 IMPLANT
TRAY FOLEY MTR SLVR 16FR STAT (SET/KITS/TRAYS/PACK) ×3 IMPLANT
TRAY FOLEY SLVR 14FR TEMP STAT (SET/KITS/TRAYS/PACK) ×1 IMPLANT
WATER STERILE IRR 1000ML POUR (IV SOLUTION) ×3 IMPLANT

## 2021-12-16 NOTE — Progress Notes (Addendum)
PROGRESS NOTE    Carlos Erickson  MEQ:[id number] DOB: 1951-10-17 DOA: 12/10/2021 PCP: Owens Loffler, MD   Brief Narrative: 71 year old with past medical history significant for  NSCLC with bone mets, hypertension, hyperlipidemia, CAD status post CABG who presented from home with altered mental status, dyspnea and mid back pain.  Work-up revealed bibasilar pneumonia, bilateral pleural effusion right greater than left, metastatic disease worse at T11 with burst fracture and some retropulsion and some new fracture lines across the pedicle bilaterally and S CLC metastasis involving bones and liver.  Patient is status post right thoracentesis yielding 400 cc serosanguineous fluid removed on 28/19/2023 by IR.  Hospital course complicated by agitation delirium 2/19, now resolved.  He was a started on delirium precaution.  Palliative care team assisting with pain management.  Currently on a scheduled narcotics and Decadron.  He underwent T10-T12 percutaneous pedicle instrumentation by Dr. Venetia Constable on 28/23/2023.    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 He received empiric IV antibiotics cefepime subsequently changed to cefdinir on 12/19 Completed, 5 days of antibiotics.   2-Malignant pleural effusion status postthoracentesis by IR 12/12/2021 Pleural fluid consistent with malignant cells, adenocarcinoma.  3-Acute metabolic encephalopathy/agitation delirium in the setting of acute illness. Resolved Continue with Seroquel twice daily and trazodone.  4-Multiple pathologic fracture of the thoracic and lumbar spine unstable T11 fracture Patient was transferred to [facility] per  neurosurgery recommendation. Palliative  helping with pain management, currently on MS Contin and received Decadron.  Underwent T10-T12 percutaneous pedicle instrumentation by Dr. Venetia Constable on 28/23/2023. Added IV dilaudid post Op.   5-Hyperkalemia: Losartan discontinued Resolved.   6-Hyperglycemia, steroid-induced:   Tachycardia: Continue with coreg.  TSH normal.  EKG sinus rhythm.   NSCLC with bone and liver mets Plan to discharge home with hospice  Hypertension: Continue with Norvasc and Coreg.   Hyponatremia; monitor.   Physical debility: Related to malignancy  Goals of care: As of 12/12/2021 Plan to discharge home with hospice care after neurosurgery treatment.          Nutrition Problem: Severe Malnutrition (in the context of chronic illness (cancer)) Etiology: mouth pain, decreased appetite    Signs/Symptoms: percent weight loss, severe fat depletion, severe muscle depletion (12.3% x 4 months) Percent weight loss: 12.3 %    Interventions: Refer to RD note for recommendations  Estimated body mass index is 25.7 kg/m as calculated from the following:   Height as of this encounter: 5\' 8"  (1.727 m).   Weight as of this encounter: 76.7 kg.   DVT prophylaxis: SCD Code Status: DNR Family Communication: sister was at bedside at time of evaluation.  Disposition Plan:  Status is: Inpatient  Remains inpatient appropriate because: management of pain       Consultants:  Oncology, Neurosurgery, palliative care team  Procedures:  Right thoracentesis on 12/12/2021 by IR.    Antimicrobials:  None  Subjective: He cam from surgery, report severe pain. Nurse just gave him pain medication.   Objective: Vitals:   12/16/21 1037 12/16/21 1052 12/16/21 1117 12/16/21 1514  BP: (!) 147/94 (!) 138/96 (!) 135/94 (!) 144/91  Pulse: (!) 109 (!) 111 (!) 112 (!) 118  Resp: 17 10 20  15  Temp:  (!) 97.3 F (36.3 C) 97.9 F (36.6 C)   TempSrc:   Oral   SpO2:  99% 96% 93% 91%  Weight:      Height:        Intake/Output Summary (Last 24 hours) at 12/16/2021 1657 Last data filed at 12/16/2021 1100 Gross per 24 hour  Intake 1717 ml  Output 1235 ml  Net 482 ml   Filed Weights   12/10/21 0427  Weight: 76.7 kg    Examination:  General exam: Appears calm and comfortable  Respiratory system: Clear to auscultation. Respiratory effort normal. Cardiovascular system: S1 & S2 heard, RRR.  Gastrointestinal system: Abdomen is nondistended, soft and nontender. No organomegaly or masses felt. Normal bowel sounds heard. Central nervous system: Alert and oriented.  Extremities: no edema    Data Reviewed: I have personally reviewed following labs and imaging studies  CBC: Recent Labs  Lab 12/10/21 0438 12/11/21 0059 12/12/21 0721 12/13/21 0644 12/16/21 0340  WBC 13.5* 10.8* 13.2* 10.5 12.7*  HGB 11.2* 9.9* 11.1* 10.7* 11.3*  HCT 32.7* 29.3* 33.1* 32.2* 33.6*  MCV 86.7 87.5 87.8 89.9 88.9  PLT 177 156 212 226 287   Basic Metabolic Panel: Recent Labs  Lab 12/11/21 0059 12/11/21 0925 12/11/21 1501 12/13/21 0644 12/15/21 0322 12/15/21 0615 12/16/21 0340  NA 128* 129* 130* 138 137  --  131*  K 3.4* 3.6 3.9 4.7 5.5* 5.1 4.6  CL 96* 95* 97* 98 97*  --  93*  CO2 22 24 24 30  33*  --  30  GLUCOSE 110* 128* 126* 151* 140*  --  141*  BUN 15 14 12 18 19   --  25*  CREATININE 0.64 0.60* 0.57* 0.86 0.68  --  0.61  CALCIUM 8.6* 8.6* 8.7* 9.2 8.9  --  9.1  MG 1.5* 1.4*  --  2.3 2.0  --  1.8  PHOS 5.2* 4.0 4.1 4.3 3.9  --  3.9   GFR: Estimated Creatinine Clearance: 81.9 mL/min (by C-G formula based on SCr of 0.61 mg/dL). Liver Function Tests: Recent Labs  Lab 12/10/21 0438 12/11/21 0059 12/11/21 0925 12/11/21 1501 12/15/21 0322 12/16/21 0340  AST 23  --   --   --   --   --   ALT 25  --   --   --   --   --   ALKPHOS 141*  --   --   --   --   --   BILITOT 1.0  --   --   --   --   --   PROT 6.5  --   --   --   --   --   ALBUMIN 2.6*  2.0* 2.1* 2.2* 2.3* 2.3*   No results for input(s): LIPASE, AMYLASE in the last 168 hours. No results for input(s): AMMONIA in the last 168 hours. Coagulation Profile: Recent Labs  Lab 12/10/21 0438 12/11/21 0059 12/16/21 0340  INR 1.1 1.2 1.2   Cardiac Enzymes: No results for input(s): CKTOTAL, CKMB, CKMBINDEX, TROPONINI in the last 168 hours. BNP (last 3 results) No results for input(s): PROBNP in the last 8760 hours. HbA1C: No results for input(s): HGBA1C in the last 72 hours. CBG: Recent Labs  Lab 12/10/21 0438  GLUCAP 133*   Lipid Profile: No results for input(s): CHOL, HDL, LDLCALC, TRIG, CHOLHDL, LDLDIRECT in the last 72 hours. Thyroid Function Tests: Recent Labs    12/16/21 0340  TSH 1.320   Anemia Panel: No results  for input(s): VITAMINB12, FOLATE, FERRITIN, TIBC, IRON, RETICCTPCT in the last 72 hours. Sepsis Labs: Recent Labs  Lab 12/10/21 0438 12/10/21 0824 12/11/21 0059 12/15/21 0322  PROCALCITON  --   --  0.13  --   LATICACIDVEN 1.4 1.2  --  1.5    Recent Results (from the past 240 hour(s))  Resp Panel by RT-PCR (Flu A&B, Covid) Nasopharyngeal Swab     Status: None   Collection Time: 12/10/21  4:38 AM   Specimen: Nasopharyngeal Swab; Nasopharyngeal(NP) swabs in vial transport medium  Result Value Ref Range Status   SARS Coronavirus 2 by RT PCR NEGATIVE NEGATIVE Final    Comment: (NOTE) SARS-CoV-2 target nucleic acids are NOT DETECTED.  The SARS-CoV-2 RNA is generally detectable in upper respiratory specimens during the acute phase of infection. The lowest concentration of SARS-CoV-2 viral copies this assay can detect is 138 copies/mL. A negative result does not preclude SARS-Cov-2 infection and should not be used as the sole basis for treatment or other patient management decisions. A negative result may occur with  improper specimen collection/handling, submission of specimen other than nasopharyngeal swab, presence of viral mutation(s)  within the areas targeted by this assay, and inadequate number of viral copies(<138 copies/mL). A negative result must be combined with clinical observations, patient history, and epidemiological information. The expected result is Negative.  Fact Sheet for Patients:  EntrepreneurPulse.com.au  Fact Sheet for Healthcare Providers:  IncredibleEmployment.be  This test is no t yet approved or cleared by the [place] FDA and  has been authorized for detection and/or diagnosis of SARS-CoV-2 by FDA under an Emergency Use Authorization (EUA). This EUA will remain  in effect (meaning this test can be used) for the duration of the COVID-19 declaration under Section 564(b)(1) of the Act, 21 U.S.C.section 360bbb-3(b)(1), unless the authorization is terminated  or revoked sooner.       Influenza A by PCR NEGATIVE NEGATIVE Final   Influenza B by PCR NEGATIVE NEGATIVE Final    Comment: (NOTE) The Xpert Xpress SARS-CoV-2/FLU/RSV plus assay is intended as an aid in the diagnosis of influenza from Nasopharyngeal swab specimens and should not be used as a sole basis for treatment. Nasal washings and aspirates are unacceptable for Xpert Xpress SARS-CoV-2/FLU/RSV testing.  Fact Sheet for Patients: EntrepreneurPulse.com.au  Fact Sheet for Healthcare Providers: IncredibleEmployment.be  This test is not yet approved or cleared by the [place] FDA and has been authorized for detection and/or diagnosis of SARS-CoV-2 by FDA under an Emergency Use Authorization (EUA). This EUA will remain in effect (meaning this test can be used) for the duration of the COVID-19 declaration under Section 564(b)(1) of the Act, 21 U.S.C. section 360bbb-3(b)(1), unless the authorization is terminated or revoked.  Performed at [facility], [place] [address] [place], [place] [address]   Blood Culture (routine x 2)      Status: None   Collection Time: 12/10/21  4:38 AM   Specimen: BLOOD  Result Value Ref Range Status   Specimen Description   Final    BLOOD BLOOD LEFT FOREARM Performed at [place] [address] [place], [place] [address]    Special Requests   Final    BOTTLES DRAWN AEROBIC AND ANAEROBIC Blood Culture adequate volume Performed at [place] [address], [place], [place] [address]    Culture   Final    NO GROWTH 5 DAYS Performed at [facility], [place] [address] [place], [place]  [address]    Report Status 12/15/2021 FINAL  Final  Urine Culture     Status: None   Collection Time: 12/10/21  4:38 AM   Specimen: In/Out Cath Urine  Result Value Ref Range Status   Specimen Description   Final    IN/OUT CATH URINE Performed at [place] [address], [place], [place] [address]    Special Requests   Final    NONE Performed at [facility], [place] [address], [place], [place] [address]    Culture   Final    NO GROWTH Performed at [facility], [place] [address] [place], [place] [address]    Report Status 12/11/2021 FINAL  Final  Blood Culture (routine x 2)     Status: None   Collection Time: 12/10/21  8:24 AM   Specimen: BLOOD RIGHT HAND  Result Value Ref Range Status   Specimen Description   Final    BLOOD RIGHT HAND Performed at [place] [address], [place], [place] [address]    Special Requests   Final    BOTTLES DRAWN AEROBIC AND ANAEROBIC Blood Culture results may not be optimal due to an excessive volume of blood received in culture bottles Performed at [place] [address] [place], [place] [address]    Culture   Final    NO GROWTH 5 DAYS Performed at [facility], [place] [address], [place], [place] [address]    Report Status 12/15/2021 FINAL  Final  MRSA Next Gen by PCR, Nasal     Status: None   Collection  Time: 12/10/21  6:55 PM   Specimen: Nasal Mucosa; Nasal Swab  Result Value Ref Range Status   MRSA by PCR Next Gen NOT DETECTED NOT DETECTED Final    Comment: (NOTE) The GeneXpert MRSA Assay (FDA approved for NASAL specimens only), is one component of a comprehensive MRSA colonization surveillance program. It is not intended to diagnose MRSA infection nor to guide or monitor treatment for MRSA infections. Test performance is not FDA approved in patients less than 86 years old. Performed at [facility], [place] [address], [place], [place] [address]   Fungus Culture With Stain     Status: None (Preliminary result)   Collection Time: 12/12/21 10:30 AM   Specimen: PATH Cytology Pleural fluid  Result Value Ref Range Status   Fungus Stain Final report  Final    Comment: (NOTE) Performed At: [facility] [place], [place] [id number] Rush Farmer MD GY:[id number]    Fungus (Mycology) Culture PENDING  Incomplete   Fungal Source PLEURAL  Final    Comment: FLUID Performed at [facility], [place] [address], [place], [place] [address]   Fungus Culture Result     Status: None   Collection Time: 12/12/21 10:30 AM  Result Value Ref Range Status   Result 1 Comment  Final    Comment: (NOTE) KOH/Calcofluor preparation:  no fungus observed. Performed At: [facility] [place], [place] [id number] Rush Farmer MD JI:[id number]   Surgical PCR screen     Status: None   Collection Time: 12/15/21 10:59 AM   Specimen: Nasal Mucosa; Nasal Swab  Result Value Ref Range Status   MRSA, PCR NEGATIVE NEGATIVE Final   Staphylococcus aureus NEGATIVE NEGATIVE Final    Comment: (NOTE) The Xpert SA Assay (FDA approved for NASAL specimens in patients 29 years of age and older),  is one component of a comprehensive surveillance program. It is not intended to diagnose infection nor to guide or monitor treatment. Performed at [facility], [place] [address], [place], [place] [address]          Radiology Studies: DG [facility] SPINE  Result Date: 12/16/2021 CLINICAL DATA:  Posterior spinal fusion. EXAM: THORACOLUMBAR SPINE 1V COMPARISON:  None. FINDINGS: Multiple intraoperative fluoroscopic spot images are provided. Posterior spinal fusion from T10 through T12. Compression fractures of the T11 and T12 vertebral bodies. FLUOROSCOPY TIME:  Fluoroscopy Time:  123.4 seconds Radiation Exposure Index (if provided by the fluoroscopic device): 75.01 mGy Number of Acquired Spot Images: 0 IMPRESSION: 1. Intraoperative fluoroscopic spot images demonstrating posterior spinal fusion from T10 through T12. Electronically Signed   By: Kathreen Devoid M.D.   On: 12/16/2021 09:51   DG C-Arm 1-60 Min-No Report  Result Date: 12/16/2021 CLINICAL DATA:  Posterior spinal fusion. EXAM: THORACOLUMBAR SPINE 1V COMPARISON:  None. FINDINGS: Multiple intraoperative fluoroscopic spot images are provided. Posterior spinal fusion from T10 through T12. Compression fractures of the T11 and T12 vertebral bodies. FLUOROSCOPY TIME:  Fluoroscopy Time:  123.4 seconds Radiation Exposure Index (if provided by the fluoroscopic device): 75.01 mGy Number of Acquired Spot Images: 0 IMPRESSION: 1. Intraoperative fluoroscopic spot images demonstrating posterior spinal fusion from T10 through T12. Electronically Signed   By: Kathreen Devoid M.D.   On: 12/16/2021 09:51   DG C-Arm 1-60 Min-No Report  Result Date: 12/16/2021 CLINICAL DATA:  Posterior spinal fusion. EXAM: THORACOLUMBAR SPINE 1V COMPARISON:  None. FINDINGS: Multiple intraoperative fluoroscopic spot images are provided. Posterior spinal fusion from T10 through T12. Compression fractures of the T11 and T12 vertebral bodies. FLUOROSCOPY TIME:  Fluoroscopy Time:  123.4 seconds Radiation Exposure Index (if provided by the fluoroscopic device): 75.01 mGy Number of Acquired Spot Images: 0 IMPRESSION: 1. Intraoperative fluoroscopic spot  images demonstrating posterior spinal fusion from T10 through T12. Electronically Signed   By: Kathreen Devoid M.D.   On: 12/16/2021 09:51        Scheduled Meds:  amLODipine  10 mg Oral Daily   aspirin EC  81 mg Oral Daily   calcitonin (salmon)  1 spray Alternating Nares Daily   carvedilol  6.25 mg Oral BID WC   Chlorhexidine Gluconate Cloth  6 each Topical Daily   feeding supplement  237 mL Oral TID BM   fentaNYL       gabapentin  300 mg Oral TID   lidocaine  1 patch Transdermal Q24H   melatonin  3 mg Oral QHS   morphine  15 mg Oral Q12H   pantoprazole  40 mg Oral Daily   polyethylene glycol  17 g Oral BID   QUEtiapine  12.5 mg Oral BID   senna  2 tablet Oral QHS   tamsulosin  0.4 mg Oral QPC supper   traZODone  50 mg Oral QHS   Continuous Infusions:   LOS: 6 days    Time spent: 35 minutes    Nayelis Bonito A Teretha Chalupa, MD [facility]   If 7PM-7AM, please contact night-coverage [email or web address]  12/16/2021, 4:57 PM

## 2021-12-16 NOTE — Anesthesia Procedure Notes (Signed)
Procedure Name: Intubation Date/Time: 12/16/2021 8:03 AM Performed by: Vonna Drafts, CRNA Pre-anesthesia Checklist: Patient identified, Emergency Drugs available, Suction available and Patient being monitored Patient Re-evaluated:Patient Re-evaluated prior to induction Oxygen Delivery Method: Circle system utilized Preoxygenation: Pre-oxygenation with 100% oxygen Induction Type: IV induction Ventilation: Mask ventilation without difficulty Laryngoscope Size: Mac and 4 Grade View: Grade II Tube type: Oral Tube size: 7.5 mm Number of attempts: 1 Airway Equipment and Method: Stylet and Oral airway Placement Confirmation: ETT inserted through vocal cords under direct vision, positive ETCO2 and breath sounds checked- equal and bilateral Secured at: 23 cm Tube secured with: Tape Dental Injury: Teeth and Oropharynx as per pre-operative assessment

## 2021-12-16 NOTE — Op Note (Signed)
PATIENT: Carlos Erickson  DAY OF SURGERY: 12/16/21   PRE-OPERATIVE DIAGNOSIS:  Pathologic T11 and T12 fractures   POST-OPERATIVE DIAGNOSIS:  Same   PROCEDURE:  T10-T12 percutaneous pedicle instrumentation   SURGEON:  Surgeon(s) and Role:    Judith Part, MD - Primary    Dawley, Pieter Partridge, DO - Assisting   ANESTHESIA: ETGA   BRIEF HISTORY: This is a 71 year old man who presented with worsening pain due to pathologic fractures from NSCLC. He had been bedbound due to pain, was scheduled to get a kyphoplasty but the fracture worsened with retropulsion and bilateral pedicle fractures, so he was no longer a candidate. He was admitted with pneumonia and the pedicle fractures were identified, prompting neurosurgical consult. I discussed this with the patient and his family at length. Given his loss of ambulation, we discussed percutaneous instrumentation with possible cement augmentation to try to improve his pain enough to allow him to return to ambulation for his remaining time or at least have better pain control and allow greater mobility. We discussed the possible need for cement augmentation and potentially additional instrumented levels depending on bone quality intra-op. We discussed risks, benefits, alternatives, and expectations regarding outcomes as well as the potential for perioperative complications due to his underlying health status. The patient wished to proceed and his family agreed.    OPERATIVE DETAIL: The patient was taken to the operating room, anesthesia was induced by the anesthesia team, and the patient was placed on the OR table in the prone position. A formal time out was performed with two patient identifiers and confirmed the operative site. The operative site was marked, hair was clipped with surgical clippers, the area was then prepped and draped in a sterile fashion.   Fluoro was used to localize the surgical levels, aided by the presence of the fractures. Stab  incisions were made over the entry points bilaterally for the T10, T11, and T12 pedicles. AP and lateral fluoroscopy was then used to guide a Jamshidi needle into the pedicle followed by placement of a blunt K wire. The blunt K-wire was then used to guide a cannulated tap and then cannulated pedicle screw at each level bilaterally. The pull-out strength at T10 and T12 felt solid, at T11, as expected, the bone felt softer so I didn't test these screws very hard. Given that the adjacent segment screws felt solid, I did not think that cement augmentation or instrumentation of additional levels was necessary.  Rods were then measured and inserted bilaterally, checked with fluoroscopy, then secured with caps and final tightened.  The wounds were copiously irrigated, all instrument and sponge counts were correct, and the incisions were then closed in layers. The patient was then returned to anesthesia for emergence. No apparent complications at the completion of the procedure.   EBL:  67mL   DRAINS: none   SPECIMENS: none   Judith Part, MD 12/16/21 7:48 AM

## 2021-12-16 NOTE — Anesthesia Postprocedure Evaluation (Signed)
Anesthesia Post Note  Patient: NILAY MANGRUM  Procedure(s) Performed: THORACIC TEN-THORACIC TWELVE PEDICLE SCREW INSTRUMENTED FUSION (Spine Thoracic)     Patient location during evaluation: PACU Anesthesia Type: General Level of consciousness: awake and alert Pain management: pain level controlled Vital Signs Assessment: post-procedure vital signs reviewed and stable Respiratory status: spontaneous breathing, nonlabored ventilation, respiratory function stable and patient connected to nasal cannula oxygen Cardiovascular status: blood pressure returned to baseline and stable Postop Assessment: no apparent nausea or vomiting Anesthetic complications: no   No notable events documented.  Last Vitals:  Vitals:   12/16/21 1052 12/16/21 1117  BP: (!) 138/96 (!) 135/94  Pulse: (!) 111 (!) 112  Resp: 10 20  Temp: (!) 36.3 C 36.6 C  SpO2: 96% 93%                Effie Berkshire

## 2021-12-16 NOTE — Progress Notes (Signed)
This chaplain is present for F/U spiritual care. The Pt. sister is at the bedside as the family prepares for his d/c home with hospice. The Pt. sister does most of the talking during the short visit.  The chaplain attempts to reconnect with one of the Pt. favorite fishing spots. The chaplain learns both the Pt. and his wife love fishing. The chaplain understands the Pt is finding it hard to get to comfortable because of the pain after his surgery. The chaplain understands the Pt. was recently given pain medication.   The Pt. sister accepted the chaplain's invitation for intercessory prayers.  Chaplain Sallyanne Kuster [phone]

## 2021-12-16 NOTE — Progress Notes (Signed)
Neurosurgery Service Progress Note  Subjective: No acute events overnight, no new complaints today, stable severe back pain, no new radicular complaints   Objective: Vitals:   12/15/21 0410 12/15/21 1211 12/16/21 0010 12/16/21 0410  BP: 112/76 123/78 110/68 101/68  Pulse: 88 (!) 103 97 (!) 108  Resp: 15 20 17 16   Temp: 98.7 F (37.1 C) 98.6 F (37 C) 98.6 F (37 C) 99 F (37.2 C)  TempSrc: Oral Oral Oral Oral  SpO2: 98% 94% 94% 97%  Weight:      Height:        Physical Exam: Strength 5/5 x4 and SILTx4 but pain limited proximally in BLE  Assessment & Plan: 71 y.o. man w/ metastatic NSCLC and refractory LBP making him bedbound, new T11 pathologic vertebral body fracture with bilateral pedicle fractures.  -OR today for perc instrumentation with possible cement augmentation  Judith Part  12/16/21 7:39 AM

## 2021-12-16 NOTE — Transfer of Care (Signed)
Immediate Anesthesia Transfer of Care Note  Patient: Carlos Erickson  Procedure(s) Performed: THORACIC TEN-THORACIC TWELVE PEDICLE SCREW INSTRUMENTED FUSION (Spine Thoracic)  Patient Location: PACU  Anesthesia Type:General  Level of Consciousness: drowsy  Airway & Oxygen Therapy: Patient Spontanous Breathing and Patient connected to face mask oxygen  Post-op Assessment: Report given to RN and Post -op Vital signs reviewed and stable  Post vital signs: Reviewed and stable  Last Vitals:  Vitals Value Taken Time  BP 138/94 12/16/21 1007  Temp    Pulse 103 12/16/21 1009  Resp 18 12/16/21 1009  SpO2 98 % 12/16/21 1009  Vitals shown include unvalidated device data.  Last Pain:  Vitals:   12/16/21 0410  TempSrc: Oral  PainSc:       Patients Stated Pain Goal: 0 (32/44/01 0272)  Complications: No notable events documented.

## 2021-12-16 NOTE — Progress Notes (Addendum)
Neurosurgery Service Post-operative progress note  Assessment & Plan: 71 y.o. man s/p perc instrumentation of pathologic fracture, seen in PACU, MAEx4 symmetrically, no ankle clonus.  -can return to prior bed on 6E -activity as tolerated, no brace needed -advance diet as tolerated -hold DVT chemoprophylaxis until 2/25 -okay to continue [place]  12/16/21 10:00 AM

## 2021-12-16 NOTE — Anesthesia Preprocedure Evaluation (Addendum)
Anesthesia Evaluation  Patient identified by MRN, date of birth, ID band Patient awake    Reviewed: Allergy & Precautions, NPO status , Patient's Chart, lab work & pertinent test results  Airway Mallampati: II  TM Distance: >3 FB Neck ROM: Full    Dental  (+) Missing,    Pulmonary sleep apnea , Patient abstained from smoking., former smoker,    breath sounds clear to auscultation       Cardiovascular hypertension, Pt. on medications and Pt. on home beta blockers + CAD and + CABG   Rhythm:Regular Rate:Tachycardia     Neuro/Psych  Neuromuscular disease    GI/Hepatic Neg liver ROS, GERD  Medicated and Controlled,  Endo/Other  negative endocrine ROS  Renal/GU negative Renal ROS     Musculoskeletal  (+) Arthritis ,   Abdominal Normal abdominal exam  (+)   Peds  Hematology negative hematology ROS (+)   Anesthesia Other Findings   Reproductive/Obstetrics                            Anesthesia Physical Anesthesia Plan  ASA: 3  Anesthesia Plan: General   Post-op Pain Management:    Induction:   PONV Risk Score and Plan: 3 and Ondansetron, Dexamethasone and Treatment may vary due to age or medical condition  Airway Management Planned: Oral ETT  Additional Equipment: None  Intra-op Plan:   Post-operative Plan: Extubation in OR  Informed Consent: I have reviewed the patients History and Physical, chart, labs and discussed the procedure including the risks, benefits and alternatives for the proposed anesthesia with the patient or authorized representative who has indicated his/her understanding and acceptance.   Patient has DNR.  Discussed DNR with patient and Suspend DNR.   Dental advisory given  Plan Discussed with: CRNA  Anesthesia Plan Comments: (Suspend DNR, no compressions. )       Anesthesia Quick Evaluation

## 2021-12-16 NOTE — Progress Notes (Deleted)
Paged hospitalist about pt's Potassium 2.9 on AM labs.

## 2021-12-16 NOTE — Progress Notes (Addendum)
Spoke with Dr. Tyrell Antonio. [place] for pt to transfer to surgical floor post sx. Does not need card tele. Made OR aware. Carroll Kinds RN

## 2021-12-17 ENCOUNTER — Inpatient Hospital Stay: Payer: Medicare Other

## 2021-12-17 ENCOUNTER — Inpatient Hospital Stay: Payer: Medicare Other | Admitting: Hematology

## 2021-12-17 ENCOUNTER — Inpatient Hospital Stay (HOSPITAL_COMMUNITY): Payer: Medicare Other

## 2021-12-17 LAB — CBC
HCT: 34.2 % — ABNORMAL LOW (ref 39.0–52.0)
Hemoglobin: 11.3 g/dL — ABNORMAL LOW (ref 13.0–17.0)
MCH: 29.5 pg (ref 26.0–34.0)
MCHC: 33 g/dL (ref 30.0–36.0)
MCV: 89.3 fL (ref 80.0–100.0)
Platelets: 233 10*3/uL (ref 150–400)
RBC: 3.83 MIL/uL — ABNORMAL LOW (ref 4.22–5.81)
RDW: 13.9 % (ref 11.5–15.5)
WBC: 14.3 10*3/uL — ABNORMAL HIGH (ref 4.0–10.5)
nRBC: 0.9 % — ABNORMAL HIGH (ref 0.0–0.2)

## 2021-12-17 LAB — ABO/RH: ABO/RH(D): O POS

## 2021-12-17 LAB — BASIC METABOLIC PANEL
Anion gap: 10 (ref 5–15)
BUN: 21 mg/dL (ref 8–23)
CO2: 34 mmol/L — ABNORMAL HIGH (ref 22–32)
Calcium: 9.2 mg/dL (ref 8.9–10.3)
Chloride: 87 mmol/L — ABNORMAL LOW (ref 98–111)
Creatinine, Ser: 0.82 mg/dL (ref 0.61–1.24)
GFR, Estimated: >60 mL/min (ref >60)
Glucose, Bld: 144 mg/dL — ABNORMAL HIGH (ref 70–99)
Potassium: 4.4 mmol/L (ref 3.5–5.1)
Sodium: 131 mmol/L — ABNORMAL LOW (ref 135–145)

## 2021-12-17 LAB — GLUCOSE, CAPILLARY: Glucose-Capillary: 123 mg/dL — ABNORMAL HIGH (ref 70–99)

## 2021-12-17 IMAGING — DX DG CHEST 1V PORT
1 series · 1 of 1 positions shown · non-contrast
Comparison: [DATE]

CLINICAL DATA: Altered mental status with cough and hypoxemia. Post
back surgery.

EXAM:
PORTABLE CHEST 1 VIEW

[chest ap]
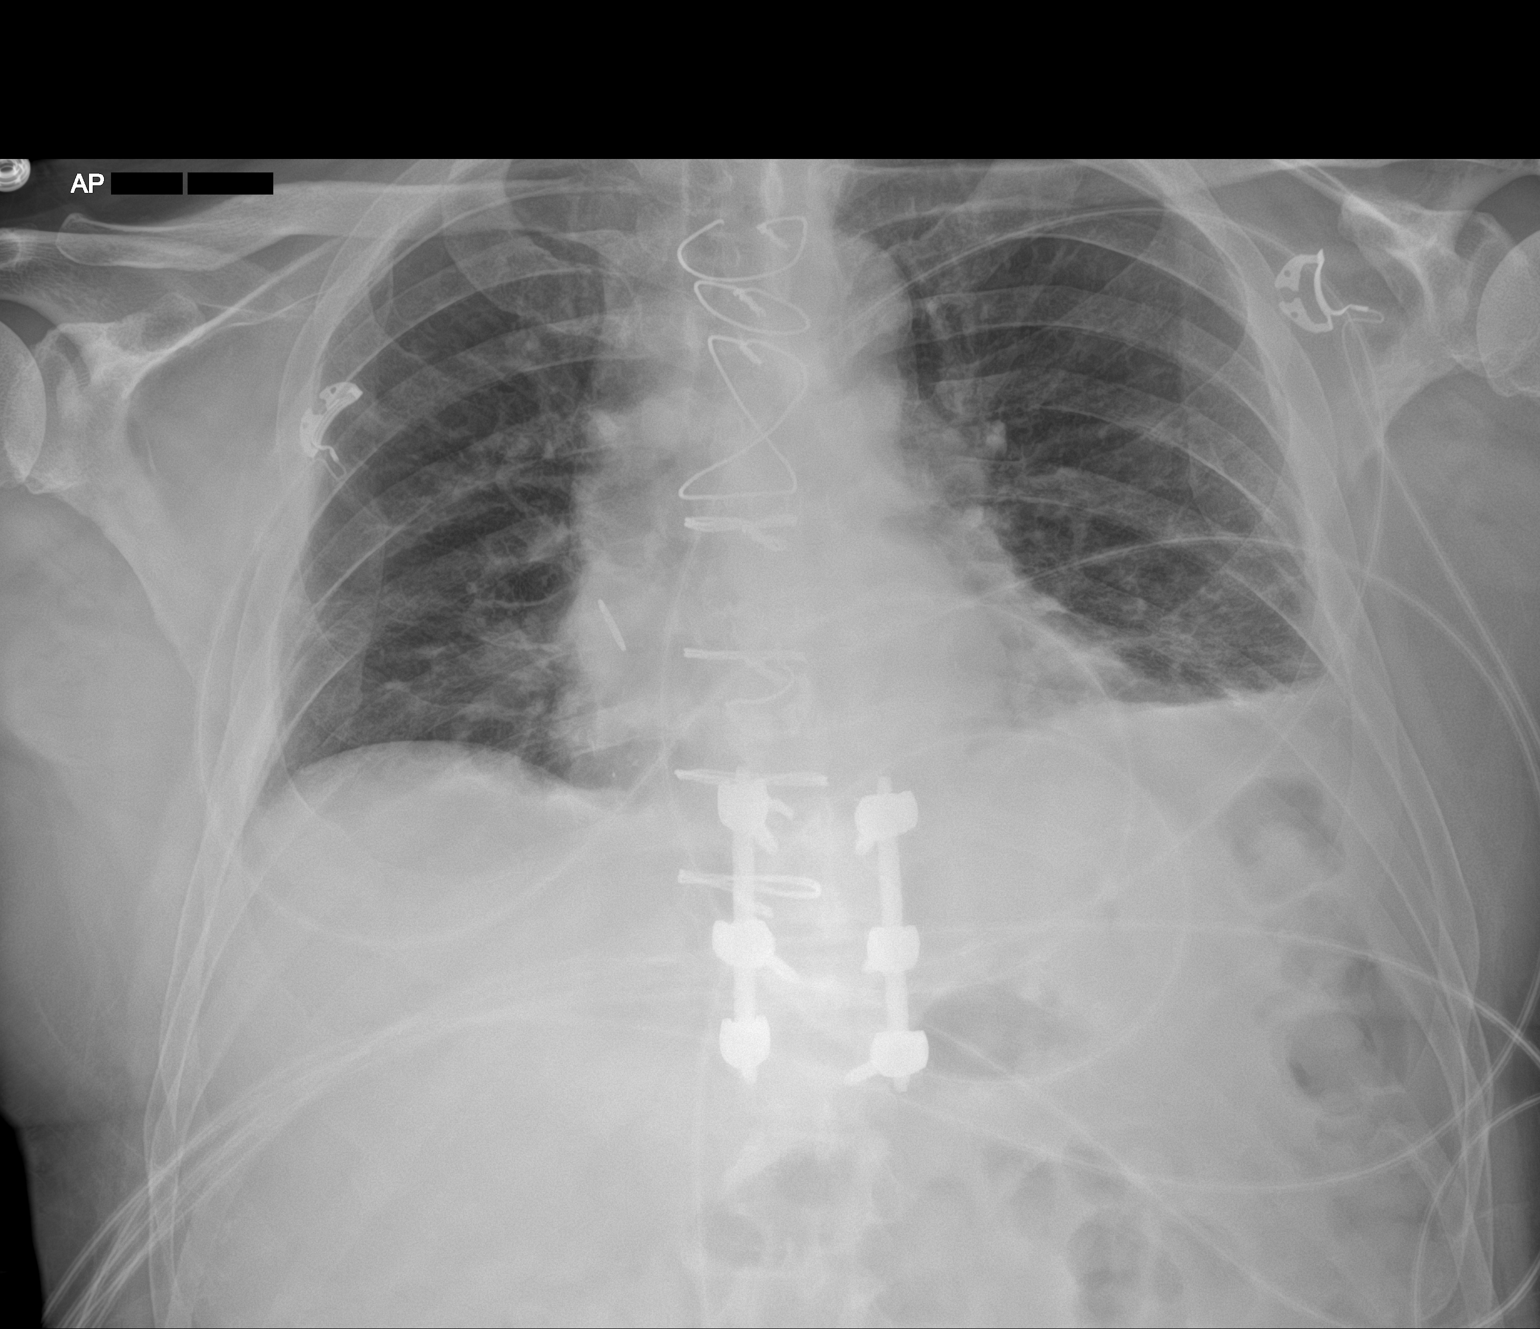

[1 of 1 positions shown; findings below may reference images not displayed]

FINDINGS: Sternotomy wires unchanged. Lungs are hypoinflated with findings
suggesting a small left pleural effusion slightly worse. Likely
associated left basilar atelectasis. Cardiomediastinal silhouette is
unremarkable. Fusion hardware intact over the thoracolumbar
junction.
IMPRESSION: Slight interval worsening small left pleural effusion likely with
associated left basilar atelectasis. Infection in the left base is
possible.

## 2021-12-17 MED ORDER — IPRATROPIUM-ALBUTEROL 0.5-2.5 (3) MG/3ML IN SOLN
3.0000 mL | Freq: Four times a day (QID) | RESPIRATORY_TRACT | Status: DC
  Administered 2021-12-17 (×2): 3 mL via RESPIRATORY_TRACT
  Filled 2021-12-17 (×2): qty 3

## 2021-12-17 MED ORDER — FUROSEMIDE 10 MG/ML IJ SOLN
20.0000 mg | Freq: Once | INTRAMUSCULAR | Status: AC
  Administered 2021-12-17: 20 mg via INTRAVENOUS
  Filled 2021-12-17: qty 2

## 2021-12-17 MED ORDER — IBUPROFEN 600 MG PO TABS
800.0000 mg | ORAL_TABLET | Freq: Three times a day (TID) | ORAL | Status: DC
  Administered 2021-12-17 – 2021-12-18 (×3): 800 mg via ORAL
  Filled 2021-12-17 (×3): qty 1

## 2021-12-17 MED ORDER — DEXAMETHASONE 2 MG PO TABS
2.0000 mg | ORAL_TABLET | Freq: Three times a day (TID) | ORAL | Status: DC
  Administered 2021-12-17 – 2021-12-18 (×3): 2 mg via ORAL
  Filled 2021-12-17 (×6): qty 1

## 2021-12-17 MED ORDER — SODIUM CHLORIDE 0.9 % IV SOLN
2.0000 g | Freq: Three times a day (TID) | INTRAVENOUS | Status: DC
  Administered 2021-12-17 – 2021-12-18 (×3): 2 g via INTRAVENOUS
  Filled 2021-12-17 (×3): qty 2

## 2021-12-17 MED ORDER — GUAIFENESIN ER 600 MG PO TB12
1200.0000 mg | ORAL_TABLET | Freq: Two times a day (BID) | ORAL | Status: DC
  Administered 2021-12-17 – 2021-12-18 (×3): 1200 mg via ORAL
  Filled 2021-12-17 (×3): qty 2

## 2021-12-17 MED ORDER — IPRATROPIUM-ALBUTEROL 0.5-2.5 (3) MG/3ML IN SOLN: 3.0000 mL | Freq: Four times a day (QID) | RESPIRATORY_TRACT | Status: DC | PRN

## 2021-12-17 NOTE — Progress Notes (Signed)
Palliative Medicine Inpatient Follow Up Note  HPI: Carlos Erickson is a 71 y.o. male with medical history significant of NSCLC w/ bone mets, HTN, HLD, CAD s/p CABG. Presenting from home with AMS and dyspnea. History obtained from wife. She said she noticed that he was in a coughing spell this morning. He couldn't seem to catch his breath and he seemed frustrated. He seemed to be in distress, but also confused. She became concerned and called for EMS. She denies any other aggravating or alleviating factors. Work-up revealed bibasilar pneumonia and bilateral pleural effusions right greater than left.  Also revealed metastatic disease worst at T11 with a burst fracture and some retropulsion and some new fracture lines across the pedicle bilaterally from NSCLC metastasis involving bones and liver.   Palliative care has been asked to get involved in the setting of metastatic non-small cell lung cancer to further address goals of care and to aid in symptom management.  Today's Discussion (12/17/2021):  *Please note that this is a verbal dictation therefore any spelling or grammatical errors are due to the "[place] [employer]" system interpretation.  Chart reviewed inclusive of vital signs, progress notes, laboratory results, and diagnostic images.   I met with Carlos Erickson at bedside this morning. He was  on his side and denied pain. He asked if I could place a dressing on his back as it was "sticky". I was able to do so. We reviewed the plan for him to go home with hospice. He shares some worry about his wife's ability to care for him though realizes his sons will be present as well.  Questions answered. ______________________________ Addendum:  I called patients wife, Carlos Erickson she shares that she is struggling herself with anxiety and has bipolar disorder which is flaring up in the setting of all of the stress regarding his discharge home. I answered her questions. I shared that right now he would not meet  IP hospice criteria though when he does I think that would be helpful to alleviate her stress. Carlos Erickson shares that she thinks she and her son(s) will take shifts but she needs to solidify that.  In terms of the question of Oxygen wife has shared that this has been straightened out.   Offered emotional support through therapeutic listening. Will make the MSW team aware of wife concerns.  Objective Assessment: Vital Signs Vitals:   12/17/21 1000 12/17/21 1136  BP:    Pulse: (!) 115 (!) 114  Resp: (!) 23 (!) 21  Temp:    SpO2: 93% 92%    Intake/Output Summary (Last 24 hours) at 12/17/2021 1143 Last data filed at 12/17/2021 0535 Gross per 24 hour  Intake --  Output 1725 ml  Net -1725 ml    Last Weight  Most recent update: 12/10/2021  4:27 AM    Weight  76.7 kg (169 lb)            Gen: Elderly Caucasian male in no acute distress HEENT: Dry mucous membranes CV: Irregular rate and  rhythm PULM: On 3 L/min nasal cannula incrementally fatigued with movement ABD: soft/nontender EXT: No edema Neuro: Alert and oriented x3  SUMMARY OF RECOMMENDATIONS   DNAR/DNI   MOST / DNR Form Completed, paper copy placed onto the chart electric copy can be found in [facility]   Chaplain support requested   Symptom management as below   Goals are to have pain so well controlled that patient can go fishing again   Ongoing palliative support  Plan to transition home with hospice after neurosurgery weighs in on additional interventions.    Code Status/Advance Care Planning: DNAR/DNI   Symptom Management:  Pain due to NCSLC with metastasis to the bones: - Continue MS Contin 15 mg twice daily - Continue hydromorphone 2 mg every 6 hours as needed - Continue gabapentin 300 mg 3 times daily - Start ibuprofen 800 mg 3 times daily  - Start dexamethasone 2 mg 3 times daily  - Cyclobenzaprine 5 mg 3 times daily as needed muscle spasms - Lidoderm 1 patch QDay - Start calcitonin nasal spray 1 Nare  alternating daily for 2 weeks   Failure to Thrive: - Appreciate nutritional involvement and management - Patient endorses that he drinks ensures at home   Xerostomia in the setting of radiation treatment: -Biotene twice daily   Insomnia: - Melatonin 3 mg nightly at bedtime   Dyspnea: - Supplemental O2 - S/P thoracentesis (2/19) (-)438m - Ongoing management per primary team - Dilaudid as above to also alleviate these symptoms   Anxiety: - Incremental in occurrence will initiate low-dose clonazepam if patient endorses continuation   Constipation: - Continue MiraLAX twice daily - Continue senna 2 tabs at bedtime - LMB (2/19 - large)  Delirium: - Precautions ordered - Seroquel 12.557mBID  MDM - High ______________________________________________________________________________________ [facility] Team Cell Phone: [phone] utilize secure chat with additional questions, if there is no response within 30 minutes please call the above phone number  Palliative Medicine Team providers are available by phone from 7am to 7pm daily and can be reached through the team cell phone.  Should this patient require assistance outside of these hours, please call the patient's attending physician.

## 2021-12-17 NOTE — TOC Progression Note (Signed)
Transition of Care [facility]) - Progression Note    Patient Details  Name: Carlos Erickson MRN: [id number] Date of Birth: 08/06/1951  [facility] CM/SW Contact  Graves-Bigelow, Ocie Cornfield, RN Phone Number: 12/17/2021, 10:00 AM  Clinical Narrative: Case Manager reached out the spouse regarding home hospice vs residential. Spouse is agreeable to home hospice; she stated she had anxiety with the oxygen. Case Manager ensured that the Hospice team will continue to educate the spouse and patient regarding the oxygen. DME has been delivered, HB, overbed table, oxygen, and wc. [place] is aware to deliver a bedside commode. Case Manager did confirm with spouse that discharge day will be late this evening vs tomorrow. MD will get back with family regarding disposition date. Case Manager did ask spouse to get the hospital bed ready in a designated space and have sheets on the bed. Case Manager will continue to follow for additional disposition needs.     Expected Discharge Plan: Home w Hospice Care Barriers to Discharge: Continued Medical Work up  Expected Discharge Plan and Services Expected Discharge Plan: [place] In-house Referral: Hospice / Palliative Care Discharge Planning Services: CM Consult Post Acute Care Choice: Hospice Living arrangements for the past 2 months: Single Family Home                 DME Arranged: Hospital bed, Oxygen, Overbed table, Lightweight manual wheelchair with seat cushion Scientist, physiological) DME Agency: Hospice and [place] Date DME Agency Contacted: 12/14/21 Time DME Agency Contacted: [phone] Representative spoke with at DME Agency: Referral Line- HH Arranged: RN [facility] Agency: Hospice and [place] ([place]) Date [place]: 12/14/21 Time [place]: 1604 Representative spoke with at [place]: Referral Line  Readmission Risk  Interventions Readmission Risk Prevention Plan 12/14/2021  Transportation Screening Complete  PCP or Specialist Appt within 3-5 Days Complete  HRI or [place] Complete  Social Work Consult for [place] Planning/Counseling [place] Screening Complete  Medication Review Press photographer) Complete  Some recent data might be hidden

## 2021-12-17 NOTE — Progress Notes (Signed)
Pharmacy Antibiotic Note  Carlos Erickson is a 71 y.o. male admitted on 12/10/2021 with pneumonia.  Pharmacy has been consulted for cefepime dosing.  Plan: Cefepime 2g IV q8h initiated for empiric treatment of HAP.   Height: 5\' 8"  (172.7 cm) Weight: 76.7 kg (169 lb) IBW/kg (Calculated) : 68.4  Temp (24hrs), Avg:98.3 F (36.8 C), Min:97.9 F (36.6 C), Max:98.8 F (37.1 C)  Recent Labs  Lab 12/11/21 0059 12/11/21 0925 12/11/21 1501 12/12/21 0721 12/13/21 0644 12/15/21 0322 12/16/21 0340 12/17/21 1259  WBC 10.8*  --   --  13.2* 10.5  --  12.7* 14.3*  CREATININE 0.64   < > 0.57*  --  0.86 0.68 0.61 0.82  LATICACIDVEN  --   --   --   --   --  1.5  --   --    < > = values in this interval not displayed.    Estimated Creatinine Clearance: 79.9 mL/min (by C-G formula based on SCr of 0.82 mg/dL).    Allergies  Allergen Reactions   Plasma Protein Fraction Anaphylaxis    Whole body rash, hypotension intra-operatively during CABG, responded to benadryl and famotidine   Fentanyl Other (See Comments)    Confusion and Hallucinations   Icosapent Ethyl     Joint pain   Morphine And Related Other (See Comments)    hallucanations    Antimicrobials this admission: Vancomycin 2/17 >> 2/18 Cefepime 2/17 >> 2/19 Cefdinir 2/20 >> 2/22 Cefazolin 2/23 (x1 dose) Cefepime 2/24 >>  Microbiology results: 2/17 Bcx: ng 2/17 UCxL ng 2/17 MRSA PCR: not detected 2/17 Flu and COVID negative 2/17 strep pneumo urine antigen negative 2/17 Legionella negative 2/19 Fungal Cx ip 2/22 MRSA PCR negative   Thank you for allowing pharmacy to be a part of this patients care.  Kaleen Mask 12/17/2021 5:11 PM

## 2021-12-17 NOTE — Progress Notes (Signed)
PROGRESS NOTE    Carlos Erickson  HGD:[id number] DOB: 08-05-1951 DOA: 12/10/2021 PCP: Owens Loffler, MD   Brief Narrative: 71 year old with past medical history significant for  NSCLC with bone mets, hypertension, hyperlipidemia, CAD status post CABG who presented from home with altered mental status, dyspnea and mid back pain.  Work-up revealed bibasilar pneumonia, bilateral pleural effusion right greater than left, metastatic disease worse at T11 with burst fracture and some retropulsion and some new fracture lines across the pedicle bilaterally and S CLC metastasis involving bones and liver.  Patient is status post right thoracentesis yielding 400 cc serosanguineous fluid removed on 28/19/2023 by IR.  Hospital course complicated by agitation delirium 2/19, now resolved.  He was a started on delirium precaution.  Palliative care team assisting with pain management.  Currently on a scheduled narcotics and Decadron.  He underwent T10-T12 percutaneous pedicle instrumentation by Dr. Venetia Constable on 28/23/2023.  Worsening Hypoxic Respiratory failure.   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 He received empiric IV antibiotics cefepime subsequently changed to cefdinir on 12/19 Completed, 5 days of antibiotics.  Worsening Hypoxemia and PNA on X ray resume IV antibiotics.  He has been requiring 4 L oxygen during hospitalization. Oxygen requirement increase to 10 L, he was transiently without oxygen. Chest x ray with worsening PNA.  Plan to give IV lasix, and resume IV antibiotics.   2-Malignant pleural effusion status postthoracentesis by IR  12/12/2021 Pleural fluid consistent with malignant cells, adenocarcinoma.  3-Acute metabolic encephalopathy/agitation delirium in the setting of acute illness. Resolved Continue with Seroquel twice daily and trazodone.  4-Multiple pathologic fracture of the thoracic and lumbar spine unstable T11 fracture Patient was transferred to [facility] per neurosurgery recommendation. Palliative  helping with pain management, currently on MS Contin and received Decadron.  Underwent T10-T12 percutaneous pedicle instrumentation by Dr. Venetia Constable on 28/23/2023. Added IV dilaudid post Op.   5-Hyperkalemia: Losartan discontinued Resolved.   6-Hyperglycemia, steroid-induced:   Tachycardia: Continue with coreg.  TSH normal.  EKG sinus rhythm.   NSCLC with bone and liver mets Plan to discharge home with hospice  Hypertension: Continue with Norvasc and Coreg.   Hyponatremia; monitor.   Physical debility: Related to malignancy  Goals of care: As of 12/12/2021 Plan to discharge home with hospice care after neurosurgery treatment.          Nutrition Problem: Severe Malnutrition (in the context of chronic illness (cancer)) Etiology: mouth pain, decreased appetite    Signs/Symptoms: percent weight loss, severe fat depletion, severe muscle depletion (12.3% x 4 months) Percent weight loss: 12.3 %    Interventions: Refer to RD note for recommendations  Estimated body mass index is 25.7 kg/m as calculated from the following:   Height as of this encounter: 5\' 8"  (1.727 m).   Weight as of this encounter: 76.7 kg.   DVT prophylaxis: SCD Code Status: DNR Family Communication: sister was at bedside at time of evaluation.  Disposition Plan:  Status is: Inpatient  Remains inpatient appropriate because: management of pain       Consultants:  Oncology, Neurosurgery, palliative care team  Procedures:  Right thoracentesis on 12/12/2021 by IR.    Antimicrobials:  None  Subjective: He report breathing better now on higher amount of oxygen. Report some cough.   Objective: Vitals:   12/17/21 1219 12/17/21 1330 12/17/21 1400 12/17/21 1457  BP: 124/78     Pulse: (!) 114 (!) 106 (!) 104 (!) 110  Resp: 19 18 15 20   Temp: 98.3 F (36.8 C)     TempSrc: Oral     SpO2: 90% 90% 94% 91%  Weight:      Height:        Intake/Output Summary (Last 24 hours) at 12/17/2021 1656 Last data filed at 12/17/2021 0535 Gross per 24 hour  Intake --  Output 1725 ml  Net -1725 ml    Filed Weights   12/10/21 0427  Weight: 76.7 kg    Examination:  General exam: NAD Respiratory system: BL crackles.  Cardiovascular system: S 1,S 2 RRR Gastrointestinal system: BS present, soft, nt Central nervous system: Alert, and oriented.  Extremities: no edema    Data Reviewed: I have personally reviewed following labs and imaging studies  CBC: Recent Labs  Lab 12/11/21 0059 12/12/21 0721 12/13/21 0644 12/16/21 0340 12/17/21 1259  WBC 10.8* 13.2* 10.5 12.7* 14.3*  HGB 9.9* 11.1* 10.7* 11.3* 11.3*  HCT 29.3* 33.1* 32.2* 33.6* 34.2*  MCV 87.5 87.8 89.9 88.9 89.3  PLT 156 212 226 245 170    Basic Metabolic Panel: Recent Labs  Lab 12/11/21 0059 12/11/21 0925 12/11/21 1501 12/13/21 0644 12/15/21 0322 12/15/21 0615 12/16/21 0340 12/17/21 1259  NA 128* 129* 130* 138 137  --  131* 131*  K 3.4* 3.6 3.9 4.7 5.5* 5.1 4.6 4.4  CL 96* 95* 97* 98 97*  --  93* 87*  CO2 22 24 24 30  33*  --  30 34*  GLUCOSE 110* 128* 126* 151* 140*  --  141* 144*  BUN 15 14 12 18 19   --  25* 21  CREATININE 0.64 0.60* 0.57* 0.86 0.68  --  0.61 0.82  CALCIUM 8.6* 8.6* 8.7* 9.2 8.9  --  9.1 9.2  MG 1.5* 1.4*  --  2.3 2.0  --  1.8  --   PHOS 5.2* 4.0 4.1 4.3 3.9  --  3.9  --     GFR: Estimated Creatinine Clearance: 79.9 mL/min (by C-G formula based on SCr of 0.82 mg/dL). Liver Function Tests: Recent Labs  Lab 12/11/21 0059 12/11/21 0925 12/11/21 1501 12/15/21 0322  12/16/21 0340  ALBUMIN 2.0* 2.1* 2.2* 2.3* 2.3*    No results for input(s): LIPASE, AMYLASE in the last 168 hours. No results for input(s): AMMONIA in the last 168 hours. Coagulation Profile: Recent Labs  Lab 12/11/21 0059 12/16/21 0340  INR 1.2 1.2    Cardiac Enzymes: No results for input(s): CKTOTAL, CKMB, CKMBINDEX, TROPONINI in the last 168 hours. BNP (last 3 results) No results for input(s): PROBNP in the last 8760 hours. HbA1C: No results for input(s): HGBA1C in the last 72 hours. CBG: Recent Labs  Lab 12/17/21 0834  GLUCAP 123*    Lipid Profile: No results for input(s): CHOL, HDL, LDLCALC, TRIG, CHOLHDL, LDLDIRECT in the last 72 hours. Thyroid Function Tests: Recent Labs    12/16/21 0340  TSH 1.320    Anemia Panel: No results for input(s): VITAMINB12, FOLATE, FERRITIN, TIBC, IRON, RETICCTPCT in the last 72 hours. Sepsis Labs: Recent Labs  Lab 12/11/21 0059 12/15/21 0322  PROCALCITON 0.13  --   LATICACIDVEN  --  1.5     Recent Results (from the past 240 hour(s))  Resp Panel by RT-PCR (Flu A&B, Covid) Nasopharyngeal Swab     Status: None   Collection Time: 12/10/21  4:38 AM   Specimen: Nasopharyngeal Swab; Nasopharyngeal(NP) swabs in vial transport medium  Result Value Ref Range Status   SARS Coronavirus 2 by RT PCR NEGATIVE NEGATIVE Final    Comment: (NOTE) SARS-CoV-2 target nucleic acids are NOT DETECTED.  The SARS-CoV-2 RNA is generally detectable in upper respiratory specimens during the acute phase of infection. The lowest concentration of SARS-CoV-2 viral copies this assay can detect is 138 copies/mL. A negative result does not preclude SARS-Cov-2 infection and should not be used as the sole basis for treatment or other patient management decisions. A negative result may occur with  improper specimen collection/handling, submission of specimen other than nasopharyngeal swab, presence of viral mutation(s) within the areas targeted by this  assay, and inadequate number of viral copies(<138 copies/mL). A negative result must be combined with clinical observations, patient history, and epidemiological information. The expected result is Negative.  Fact Sheet for Patients:  EntrepreneurPulse.com.au  Fact Sheet for Healthcare Providers:  IncredibleEmployment.be  This test is no t yet approved or cleared by the [place] FDA and  has been authorized for detection and/or diagnosis of SARS-CoV-2 by FDA under an Emergency Use Authorization (EUA). This EUA will remain  in effect (meaning this test can be used) for the duration of the COVID-19 declaration under Section 564(b)(1) of the Act, 21 U.S.C.section 360bbb-3(b)(1), unless the authorization is terminated  or revoked sooner.       Influenza A by PCR NEGATIVE NEGATIVE Final   Influenza B by PCR NEGATIVE NEGATIVE Final    Comment: (NOTE) The Xpert Xpress SARS-CoV-2/FLU/RSV plus assay is intended as an aid in the diagnosis of influenza from Nasopharyngeal swab specimens and should not be used as a sole basis for treatment. Nasal washings and aspirates are unacceptable for Xpert Xpress SARS-CoV-2/FLU/RSV testing.  Fact Sheet for Patients: EntrepreneurPulse.com.au  Fact Sheet for Healthcare Providers: IncredibleEmployment.be  This test is not yet approved or cleared by the [place] FDA and has been authorized for detection and/or diagnosis of SARS-CoV-2 by FDA under an Emergency Use Authorization (EUA). This EUA will remain in effect (meaning this test can be used) for the duration of the COVID-19 declaration under Section 564(b)(1) of the Act, 21 U.S.C. section 360bbb-3(b)(1), unless the authorization is terminated or revoked.  Performed at [facility], [place] [address], [place], [place] [address]   Blood Culture (routine x 2)     Status: None   Collection Time:  12/10/21  4:38 AM   Specimen: BLOOD  Result Value Ref Range Status   Specimen Description   Final    BLOOD BLOOD LEFT FOREARM Performed at [place] [address], [place], [place] [address]    Special Requests   Final    BOTTLES DRAWN AEROBIC AND ANAEROBIC Blood Culture adequate volume Performed at [place] [address], [place], [place] [address]    Culture   Final    NO GROWTH 5 DAYS Performed at [facility], [place] [address] [place], [place] [address]    Report Status 12/15/2021 FINAL  Final  Urine Culture     Status: None   Collection Time: 12/10/21  4:38 AM   Specimen: In/Out Cath Urine  Result Value Ref Range Status   Specimen Description   Final    IN/OUT CATH URINE Performed at [facility],  [place] [address], [place], [place] [address]    Special Requests   Final    NONE Performed at [facility], [place] [address], [place], [place] [address]    Culture   Final    NO GROWTH Performed at [facility], [place] [address], [place], [place] [address]    Report Status 12/11/2021 FINAL  Final  Blood Culture (routine x 2)     Status: None   Collection Time: 12/10/21  8:24 AM   Specimen: BLOOD RIGHT HAND  Result Value Ref Range Status   Specimen Description   Final    BLOOD RIGHT HAND Performed at [place] [address] [place], [place] [address]    Special Requests   Final    BOTTLES DRAWN AEROBIC AND ANAEROBIC Blood Culture results may not be optimal due to an excessive volume of blood received in culture bottles Performed at [place] [address] [place], [place] [address]    Culture   Final    NO GROWTH 5 DAYS Performed at [facility], [place] [address] [place], [place] [address]    Report Status 12/15/2021 FINAL  Final  MRSA Next Gen by PCR, Nasal     Status: None   Collection Time: 12/10/21  6:55 PM   Specimen:  Nasal Mucosa; Nasal Swab  Result Value Ref Range Status   MRSA by PCR Next Gen NOT DETECTED NOT DETECTED Final    Comment: (NOTE) The GeneXpert MRSA Assay (FDA approved for NASAL specimens only), is one component of a comprehensive MRSA colonization surveillance program. It is not intended to diagnose MRSA infection nor to guide or monitor treatment for MRSA infections. Test performance is not FDA approved in patients less than 38 years old. Performed at [facility], [place] [address] [place], [place] [address]   Fungus Culture With Stain     Status: None (Preliminary result)   Collection Time: 12/12/21 10:30 AM   Specimen: PATH Cytology Pleural fluid  Result Value Ref Range Status   Fungus Stain Final report  Final    Comment: (NOTE) Performed At: [facility] [place], [place] [id number] Rush Farmer MD OZ:[id number]    Fungus (Mycology) Culture PENDING  Incomplete   Fungal Source PLEURAL  Final    Comment: FLUID Performed at [facility], [place] [address] [place], [place] [address]   Fungus Culture Result     Status: None   Collection Time: 12/12/21 10:30 AM  Result Value Ref Range Status   Result 1 Comment  Final    Comment: (NOTE) KOH/Calcofluor preparation:  no fungus observed. Performed At: [facility] [place], [place] [id number] Rush Farmer MD RJ:[id number]   Surgical PCR screen     Status: None   Collection Time: 12/15/21 10:59 AM   Specimen: Nasal Mucosa; Nasal Swab  Result Value Ref Range Status   MRSA, PCR NEGATIVE NEGATIVE Final   Staphylococcus aureus NEGATIVE NEGATIVE Final    Comment: (NOTE) The Xpert SA Assay (FDA approved for NASAL specimens in patients 63 years of age and older), is one component of a comprehensive surveillance program. It is not intended to diagnose infection nor to guide or monitor treatment. Performed at [facility], [place] [address], [place], [place] [address]            Radiology Studies: DG [facility] SPINE  Result Date: 12/16/2021 CLINICAL DATA:  Posterior  spinal fusion. EXAM: THORACOLUMBAR SPINE 1V COMPARISON:  None. FINDINGS: Multiple intraoperative fluoroscopic spot images are provided. Posterior spinal fusion from T10 through T12. Compression fractures of the T11 and T12 vertebral bodies. FLUOROSCOPY TIME:  Fluoroscopy Time:  123.4 seconds Radiation Exposure Index (if provided by the fluoroscopic device): 75.01 mGy Number of Acquired Spot Images: 0 IMPRESSION: 1. Intraoperative fluoroscopic spot images demonstrating posterior spinal fusion from T10 through T12. Electronically Signed   By: Kathreen Devoid M.D.   On: 12/16/2021 09:51   DG CHEST PORT 1 VIEW  Result Date: 12/17/2021 CLINICAL DATA:  Altered mental status with cough and hypoxemia. Post back surgery. EXAM: PORTABLE CHEST 1 VIEW COMPARISON:  12/12/2021 FINDINGS: Sternotomy wires unchanged. Lungs are hypoinflated with findings suggesting a small left pleural effusion slightly worse. Likely associated left basilar atelectasis. Cardiomediastinal silhouette is unremarkable. Fusion hardware intact over the thoracolumbar junction. IMPRESSION: Slight interval worsening small left pleural effusion likely with associated left basilar atelectasis. Infection in the left base is possible. Electronically Signed   By: Marin Olp M.D.   On: 12/17/2021 13:07   DG C-Arm 1-60 Min-No Report  Result Date: 12/16/2021 CLINICAL DATA:  Posterior spinal fusion. EXAM: THORACOLUMBAR SPINE 1V COMPARISON:  None. FINDINGS: Multiple intraoperative fluoroscopic spot images are provided. Posterior spinal fusion from T10 through T12. Compression fractures of the T11 and T12 vertebral bodies. FLUOROSCOPY TIME:  Fluoroscopy Time:  123.4 seconds Radiation Exposure Index (if provided by the fluoroscopic device): 75.01 mGy Number of Acquired Spot Images: 0 IMPRESSION: 1. Intraoperative fluoroscopic spot images demonstrating  posterior spinal fusion from T10 through T12. Electronically Signed   By: Kathreen Devoid M.D.   On: 12/16/2021 09:51   DG C-Arm 1-60 Min-No Report  Result Date: 12/16/2021 CLINICAL DATA:  Posterior spinal fusion. EXAM: THORACOLUMBAR SPINE 1V COMPARISON:  None. FINDINGS: Multiple intraoperative fluoroscopic spot images are provided. Posterior spinal fusion from T10 through T12. Compression fractures of the T11 and T12 vertebral bodies. FLUOROSCOPY TIME:  Fluoroscopy Time:  123.4 seconds Radiation Exposure Index (if provided by the fluoroscopic device): 75.01 mGy Number of Acquired Spot Images: 0 IMPRESSION: 1. Intraoperative fluoroscopic spot images demonstrating posterior spinal fusion from T10 through T12. Electronically Signed   By: Kathreen Devoid M.D.   On: 12/16/2021 09:51        Scheduled Meds:  amLODipine  10 mg Oral Daily   aspirin EC  81 mg Oral Daily   calcitonin (salmon)  1 spray Alternating Nares Daily   carvedilol  6.25 mg Oral BID WC   Chlorhexidine Gluconate Cloth  6 each Topical Daily   dexamethasone  2 mg Oral TID   feeding supplement  237 mL Oral TID BM   furosemide  20 mg Intravenous Once   gabapentin  300 mg Oral TID   guaiFENesin  1,200 mg Oral BID   ibuprofen  800 mg Oral TID   ipratropium-albuterol  3 mL Nebulization Q6H   lidocaine  1 patch Transdermal Q24H   melatonin  3 mg Oral QHS   morphine  15 mg Oral Q12H   pantoprazole  40 mg Oral Daily   polyethylene glycol  17 g Oral BID   QUEtiapine  12.5 mg Oral BID   senna  2 tablet Oral QHS   tamsulosin  0.4 mg Oral QPC supper   traZODone  50 mg Oral QHS   Continuous Infusions:   LOS: 7 days    Time spent: 35 minutes    Elmarie Shiley, MD [facility]  If 7PM-7AM, please contact night-coverage [email or web address]  12/17/2021, 4:56 PM

## 2021-12-17 NOTE — Progress Notes (Signed)
Neurosurgery Service Progress Note  Subjective: No acute events overnight, no new complaints today, back pain significantly improved from preop, he's quite pleased - able to move around in bed without severe pain, sitting up better, hasn't ambulated yet  Objective: Vitals:   12/16/21 2313 12/17/21 0315 12/17/21 0525 12/17/21 0805  BP:  121/81 124/85 135/83  Pulse:    (!) 113  Resp: 15 16  13   Temp: 97.9 F (36.6 C) 98.8 F (37.1 C)  98.7 F (37.1 C)  TempSrc: Oral Oral  Oral  SpO2: 93% 93%  92%  Weight:      Height:        Physical Exam: Strength 5/5 x4 and SILTx4, no clonus, reflexes 1+  Assessment & Plan: 71 y.o. man w/ metastatic NSCLC and refractory LBP making him bedbound, new T11 pathologic vertebral body fracture with bilateral pedicle fractures. 2/23 s/p T10-T12 perc pedicle instrumentation  -doing very well, activity as tolerated, no brace needed, foley is out -okay for discharge from my standpoint whenever otherwise medically ready -okay for DVT chemoprophylaxis on 2/25 from my standpoint  Judith Part  12/17/21 9:40 AM

## 2021-12-17 NOTE — Plan of Care (Signed)
°  Problem: Activity: Goal: Risk for activity intolerance will decrease Outcome: Progressing   Problem: Nutrition: Goal: Adequate nutrition will be maintained Outcome: Progressing   Problem: Coping: Goal: Level of anxiety will decrease Outcome: Progressing   Problem: Elimination: Goal: Will not experience complications related to bowel motility Outcome: Progressing Goal: Will not experience complications related to urinary retention Outcome: Progressing   Problem: Pain Managment: Goal: General experience of comfort will improve Outcome: Progressing   Problem: Safety: Goal: Ability to remain free from injury will improve Outcome: Progressing   Problem: Skin Integrity: Goal: Risk for impaired skin integrity will decrease Outcome: Progressing   Problem: Safety: Goal: Non-violent Restraint(s) Outcome: Progressing

## 2021-12-17 NOTE — Progress Notes (Signed)
Manufacturing engineer [facility]) Hospital Liaison Note:  Spoke to patient at bedside with spouse Ruby on speaker phone to confirm plans with hospice upon discharge, answering questions about comfort care and DME. Spouse readying room at home for patient at this time and is looking forward to pending discharge. [facility] has ordered additional DME [facility]) per patient request.   [facility] aware of possible discharge today or tomorrow and will follow up with patient spouse to set up [facility] admission nurse visit this weekend.   Please reach out to any hospice related issues,  Thank you for the opportunity to participate in this patient's care.  Gar Ponto, RN [facility] HLT [phone]

## 2021-12-18 LAB — GLUCOSE, CAPILLARY: Glucose-Capillary: 156 mg/dL — ABNORMAL HIGH (ref 70–99)

## 2021-12-18 MED ORDER — HYDROMORPHONE HCL 2 MG PO TABS: 2.0000 mg | ORAL_TABLET | Freq: Four times a day (QID) | ORAL | 0 refills | Status: AC | PRN

## 2021-12-18 MED ORDER — GUAIFENESIN ER 600 MG PO TB12: 1200.0000 mg | ORAL_TABLET | Freq: Two times a day (BID) | ORAL | 0 refills | Status: AC

## 2021-12-18 MED ORDER — CYCLOBENZAPRINE HCL 5 MG PO TABS: 5.0000 mg | ORAL_TABLET | Freq: Three times a day (TID) | ORAL | 0 refills | Status: AC | PRN

## 2021-12-18 MED ORDER — MELATONIN 3 MG PO TABS: 3.0000 mg | ORAL_TABLET | Freq: Every day | ORAL | 0 refills | Status: AC

## 2021-12-18 MED ORDER — IBUPROFEN 800 MG PO TABS
800.0000 mg | ORAL_TABLET | Freq: Three times a day (TID) | ORAL | 0 refills | Status: AC
Start: 2021-12-18 — End: ?

## 2021-12-18 MED ORDER — ONDANSETRON HCL 8 MG PO TABS: 8.0000 mg | ORAL_TABLET | Freq: Two times a day (BID) | ORAL | 1 refills | Status: AC | PRN

## 2021-12-18 MED ORDER — DEXAMETHASONE 2 MG PO TABS: 2.0000 mg | ORAL_TABLET | Freq: Three times a day (TID) | ORAL | 0 refills | Status: AC

## 2021-12-18 MED ORDER — LIDOCAINE 5 % EX PTCH: 1.0000 | MEDICATED_PATCH | CUTANEOUS | 0 refills | Status: AC

## 2021-12-18 MED ORDER — TRAZODONE HCL 50 MG PO TABS
50.0000 mg | ORAL_TABLET | Freq: Every day | ORAL | 0 refills | Status: AC
Start: 2021-12-18 — End: ?

## 2021-12-18 MED ORDER — IPRATROPIUM-ALBUTEROL 0.5-2.5 (3) MG/3ML IN SOLN
3.0000 mL | Freq: Four times a day (QID) | RESPIRATORY_TRACT | 0 refills | Status: AC | PRN
Start: 2021-12-18 — End: ?

## 2021-12-18 MED ORDER — QUETIAPINE FUMARATE 25 MG PO TABS: 12.5000 mg | ORAL_TABLET | Freq: Two times a day (BID) | ORAL | 0 refills | Status: AC

## 2021-12-18 MED ORDER — MORPHINE SULFATE ER 15 MG PO TBCR: 15.0000 mg | EXTENDED_RELEASE_TABLET | Freq: Two times a day (BID) | ORAL | 0 refills | Status: AC

## 2021-12-18 NOTE — TOC Progression Note (Signed)
Transition of Care [facility]) - Progression Note    Patient Details  Name: Carlos Erickson MRN: [id number] Date of Birth: [birth year]  Transition of Care [facility]) CM/SW Contact  Zenon Mayo, RN Phone Number: 12/18/2021, 9:52 AM  Clinical Narrative:    NCM spoke with wife , she states she has the DME at the home except for the [facility], wife states he will need ambulance transport home, address confirmed. She states she would like to speak with MD.  NCM informed her will let MD know to call her.    Expected Discharge Plan: Home w Hospice Care Barriers to Discharge: Continued Medical Work up  Expected Discharge Plan and Services Expected Discharge Plan: [place] In-house Referral: Hospice / Palliative Care Discharge Planning Services: CM Consult Post Acute Care Choice: Hospice Living arrangements for the past 2 months: Single Family Home                 DME Arranged: Hospital bed, Oxygen, Overbed table, Lightweight manual wheelchair with seat cushion Scientist, physiological) DME Agency: Hospice and [place] Date DME Agency Contacted: 12/14/21 Time DME Agency Contacted: [phone] Representative spoke with at DME Agency: Referral Line- HH Arranged: RN [facility] Agency: Hospice and [place] ([place]) Date [place]: 12/14/21 Time [place]: 1604 Representative spoke with at [place]: Referral Line   Social Determinants of Health ([place]) Interventions    Readmission Risk Interventions Readmission Risk Prevention Plan 12/14/2021  Transportation Screening Complete  PCP or Specialist Appt within 3-5 Days Complete  HRI or [place] Complete  Social Work Consult for [place] Planning/Counseling Complete  Palliative Care Screening Complete  Medication Review Press photographer) Complete  Some recent data might be hidden

## 2021-12-18 NOTE — TOC Transition Note (Addendum)
Transition of Care [facility]) - CM/SW Discharge Note   Patient Details  Name: Carlos Erickson MRN: [id number] Date of Birth: 1950/10/26  Transition [facility]) CM/SW Contact:  Zenon Mayo, RN Phone Number: 12/18/2021, 10:09 AM   Clinical Narrative:    Patient is for dc per MD.  NCM will call wife to see what time she would like transport set up, she stated at 1 pm when spoke with her earlier this am.  Anderson Malta with [facility] also will call patient's wife.  Wife would like ptar scheduled at 16 am.  NCM called to schedule ptar for 11 am. NCM notified Staff RN also.   Final next level of care: Home w Hospice Care Barriers to Discharge: Continued Medical Work up   Patient Goals and CMS Choice Patient states their goals for this hospitalization and ongoing recovery are:: to return home with hospice services CMS Medicare.gov Compare Post Acute Care list provided to:: Patient Choice offered to / list presented to : Patient  Discharge Placement                       Discharge Plan and Services In-house Referral: Hospice / Palliative Care Discharge Planning Services: CM Consult Post Acute Care Choice: Hospice          DME Arranged: Hospital bed, Oxygen, Overbed table, Lightweight manual wheelchair with seat cushion Scientist, physiological) DME Agency: Hospice and [place] Date DME Agency Contacted: 12/14/21 Time DME Agency Contacted: [phone] Representative spoke with at DME Agency: Referral Line- HH Arranged: RN [facility] Agency: Hospice and [place] ([place]) Date [place]: 12/14/21 Time [place]: 1604 Representative spoke with at [place]: Referral Line  Social Determinants of Health ([place]) Interventions     Readmission Risk Interventions Readmission Risk Prevention Plan 12/14/2021  Transportation Screening Complete  PCP or Specialist Appt within 3-5 Days Complete  HRI or [place] Complete  Social Work Consult for [place] Planning/Counseling Complete  Palliative Care Screening Complete  Medication Review Press photographer) Complete  Some recent data might be hidden

## 2021-12-18 NOTE — Progress Notes (Signed)
Manufacturing engineer (ACC)  Plans to d/c home today with hospice services.  Necessary DME in place, with the exception of BSC, however is not necessary to discharge home.  BSC should be delivered today or tomorrow.  Please ensure that comfort meds are in place so there is no lapse in his comfort prior to hospice arriving in the home.   Venia Carbon BSN, RN [facility] Liaison

## 2021-12-18 NOTE — Discharge Summary (Signed)
Physician Discharge Summary  Carlos Erickson YTK:[id number] DOB: [birth year] DOA: 12/10/2021  PCP: Owens Loffler, MD  Admit date: 12/10/2021 Discharge date: 12/18/2021  Admitted From: Home  Disposition: Home with Hospice.   Recommendations for Outpatient Follow-up:  Home with Hospice.     Discharge Condition: stable.  CODE STATUS: DNR Diet recommendation: Heart Healthy   Brief/Interim Summary: 71 year old with past medical history significant for  NSCLC with bone mets, hypertension, hyperlipidemia, CAD status post CABG who presented from home with altered mental status, dyspnea and mid back pain.  Work-up revealed bibasilar pneumonia, bilateral pleural effusion right greater than left, metastatic disease worse at T11 with burst fracture and some retropulsion and some new fracture lines across the pedicle bilaterally and S CLC metastasis involving bones and liver.   Patient is status post right thoracentesis yielding 400 cc serosanguineous fluid removed on 28/19/2023 by IR.   Hospital course complicated by agitation delirium 2/19, now resolved.  He was a started on delirium precaution.  Palliative care team assisting with pain management.  Currently on a scheduled narcotics and Decadron.   He underwent T10-T12 percutaneous pedicle instrumentation by Dr. Venetia Constable on 28/23/2023.   Worsening Hypoxic Respiratory failure.   1-Bibasilar pneumonia, POA: Acute Hypoxic Respiratory Failure;  Bilateral right greater than left pleural effusion. He received empiric IV antibiotics cefepime subsequently changed to cefdinir on 12/19 Worsening Hypoxemia and PNA on X ray resume IV antibiotics  on 2/24. He has been requiring 4 L oxygen during hospitalization. Oxygen requirement increase to 10 L, he was transiently without oxygen. Chest x ray with worsening PNA.  Now down to 4 L oxygen. Plan to discharge on oral antibiotics.  Received one dose of lasix.  Discharge on Augmentin    2-Malignant  pleural effusion status postthoracentesis by IR 12/12/2021 Pleural fluid consistent with malignant cells, adenocarcinoma.   3-Acute metabolic encephalopathy/agitation delirium in the setting of acute illness. Resolved Continue with Seroquel twice daily and trazodone.   4-Multiple pathologic fracture of the thoracic and lumbar spine unstable T11 fracture Patient was transferred to [facility] per neurosurgery recommendation. Palliative  helping with pain management, currently on MS Contin and received Decadron.  Underwent T10-T12 percutaneous pedicle instrumentation by Dr. Venetia Constable on 28/23/2023.    5-Hyperkalemia: Losartan discontinued Resolved.    6-Hyperglycemia, steroid-induced:     Tachycardia: Continue with coreg.  TSH normal.  EKG sinus rhythm.    NSCLC with bone and liver mets Plan to discharge home with hospice   Hypertension: Continue with Norvasc and Coreg.    Hyponatremia; monitor.    Physical debility: Related to malignancy.   Goals of care: As of 12/12/2021 Plan to discharge home with hospice care after neurosurgery treatment.    Wife updated, 2/25.   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   Increase activity slowly   Complete by: As directed       Allergies as of 12/18/2021       Reactions   Plasma Protein Fraction Anaphylaxis   Whole body rash, hypotension intra-operatively during CABG,  responded to benadryl and famotidine   Fentanyl Other (See Comments)   Confusion and Hallucinations   Icosapent Ethyl     Joint pain   Morphine And Related Other (See Comments)   hallucanations        Medication List     STOP taking these medications    atorvastatin 20 MG tablet Commonly known as: LIPITOR   clopidogrel 75 MG tablet Commonly known as: PLAVIX   folic acid 1 MG tablet Commonly known as: FOLVITE   losartan 100 MG tablet Commonly known as: COZAAR   prochlorperazine 10 MG tablet Commonly known as: COMPAZINE   Repatha SureClick 762 MG/ML Soaj Generic drug: Evolocumab       TAKE these medications    acetaminophen 500 MG tablet Commonly known as: TYLENOL Take 500 mg by mouth every 6 (six) hours as needed for moderate pain or headache.   amLODipine 10 MG tablet Commonly known as: NORVASC TAKE 1 TABLET BY MOUTH  DAILY   aspirin EC 81 MG tablet Take 81 mg by mouth daily. Swallow whole.   carvedilol 6.25 MG tablet Commonly known as: COREG TAKE 1 TABLET BY MOUTH  TWICE DAILY What changed: when to take this   cyclobenzaprine 5 MG tablet Commonly known as: FLEXERIL Take 1 tablet (5 mg total) by mouth 3 (three) times daily as needed for muscle spasms.   dexamethasone 2 MG tablet Commonly known as: DECADRON Take 1 tablet (2 mg total) by mouth 3 (three) times daily.   docusate sodium 100 MG capsule Commonly known as: COLACE Take 200 mg by mouth daily.   gabapentin 300 MG capsule Commonly known as: NEURONTIN Take 1 capsule (300 mg total) by mouth 2 (two) times daily. What changed: when to take this   guaiFENesin 600 MG 12 hr tablet Commonly known as: MUCINEX Take 2 tablets (1,200 mg total) by mouth 2 (two) times daily.   HYDROmorphone 2 MG tablet Commonly known as: DILAUDID Take 1 tablet (2 mg total) by mouth every 6 (six) hours as needed for severe pain.   ibuprofen 800 MG tablet Commonly known as: ADVIL Take 1 tablet (800 mg total) by mouth 3 (three) times daily.   ipratropium-albuterol 0.5-2.5 (3) MG/3ML Soln Commonly known as: DUONEB Take 3 mLs by  nebulization every 6 (six) hours as needed.   lidocaine 5 % Commonly known as: LIDODERM Place 1 patch onto the skin daily. Remove & Discard patch within 12 hours or as directed by MD   melatonin 3 MG Tabs tablet Take 1 tablet (3 mg total) by mouth at bedtime.   morphine 15 MG 12 hr tablet Commonly known as: MS CONTIN Take 1 tablet (15 mg total) by mouth every 12 (twelve) hours.   omeprazole 20 MG capsule Commonly known as: PRILOSEC Take 1 capsule (20 mg total) by mouth daily.   ondansetron 8 MG tablet Commonly known as: Zofran Take 1 tablet (8 mg total) by mouth 2 (two) times daily as needed (Nausea or vomiting). Start if needed on the third day after carboplatin.   polyethylene glycol 17 g packet Commonly known as: MIRALAX / GLYCOLAX Take 17 g by mouth 2 (two) times daily.   QUEtiapine 25 MG tablet Commonly known as: SEROQUEL Take 0.5 tablets (12.5 mg total) by mouth 2 (two) times daily.   senna 8.6 MG tablet Commonly known as: SENOKOT Take 2 tablets by mouth at bedtime.   tamsulosin 0.4 MG Caps capsule Commonly known as: Flomax Take 1 capsule (0.4 mg  total) by mouth daily after supper.   traZODone 50 MG tablet Commonly known as: DESYREL Take 1 tablet (50 mg total) by mouth at bedtime.               Discharge Care Instructions  (From admission, onward)           Start     Ordered   12/18/21 0000  Discharge wound care:       Comments: See neurosurgery recommendation.   12/18/21 1002            Follow-up Information     [facility] Follow up.   Why: Registered Nurse- office to call with visit times. Contact information: [place] [address] [phone]                Allergies  Allergen Reactions   Plasma Protein Fraction Anaphylaxis    Whole body rash, hypotension intra-operatively during CABG, responded to benadryl and famotidine   Fentanyl Other (See Comments)    Confusion and Hallucinations    Icosapent Ethyl     Joint pain   Morphine And Related Other (See Comments)    hallucanations    Consultations: Neurosurgery Palliative   Procedures/Studies: DG THORACOLUMABAR SPINE  Result Date: 12/16/2021 CLINICAL DATA:  Posterior spinal fusion. EXAM: THORACOLUMBAR SPINE 1V COMPARISON:  None. FINDINGS: Multiple intraoperative fluoroscopic spot images are provided. Posterior spinal fusion from T10 through T12. Compression fractures of the T11 and T12 vertebral bodies. FLUOROSCOPY TIME:  Fluoroscopy Time:  123.4 seconds Radiation Exposure Index (if provided by the fluoroscopic device): 75.01 mGy Number of Acquired Spot Images: 0 IMPRESSION: 1. Intraoperative fluoroscopic spot images demonstrating posterior spinal fusion from T10 through T12. Electronically Signed   By: Kathreen Devoid M.D.   On: 12/16/2021 09:51   CT Head Wo Contrast  Result Date: 12/10/2021 CLINICAL DATA:  Altered mental status, recent diagnosis of non-small cell lung cancer EXAM: CT HEAD WITHOUT CONTRAST TECHNIQUE: Contiguous axial images were obtained from the base of the skull through the vertex without intravenous contrast. RADIATION DOSE REDUCTION: This exam was performed according to the departmental dose-optimization program which includes automated exposure control, adjustment of the mA and/or kV according to patient size and/or use of iterative reconstruction technique. COMPARISON:  Brain MRI 11/11/2021 FINDINGS: Brain: There is no evidence of acute intracranial hemorrhage, extra-axial fluid collection, or acute infarct. Parenchymal volume is normal. The ventricles are normal in size. Gray-white differentiation is preserved. There is no significant burden of white matter microangiopathic change. There is no mass lesion.  There is no midline shift. Vascular: There is calcification of the bilateral cavernous ICAs. Skull: Normal. Negative for fracture or focal lesion. Sinuses/Orbits: The paranasal sinuses are clear. A right lens  implant is noted. The globes and orbits are otherwise unremarkable. Other: None. IMPRESSION: No acute intracranial pathology. Electronically Signed   By: Valetta Mole M.D.   On: 12/10/2021 09:27   CT Angio Chest PE W and/or Wo Contrast  Result Date: 12/10/2021 CLINICAL DATA:  A 71 year old male presents for evaluation of nonlocalized abdominal pain and altered mental status. Also found to have findings of bronchogenic or suspected bronchogenic neoplasm on previous imaging. EXAM: CT ANGIOGRAPHY CHEST CT ABDOMEN AND PELVIS WITH CONTRAST TECHNIQUE: Multidetector CT imaging of the chest was performed using the standard protocol during bolus administration of intravenous contrast. Multiplanar CT image reconstructions and MIPs were obtained to evaluate the vascular anatomy. Multidetector CT imaging of the abdomen and pelvis was performed using the  standard protocol during bolus administration of intravenous contrast. RADIATION DOSE REDUCTION: This exam was performed according to the departmental dose-optimization program which includes automated exposure control, adjustment of the mA and/or kV according to patient size and/or use of iterative reconstruction technique. CONTRAST:  144mL OMNIPAQUE IOHEXOL 350 MG/ML SOLN COMPARISON:  Comparison is made with October 31, 2021. FINDINGS: CTA CHEST FINDINGS Cardiovascular: Calcified and noncalcified atheromatous plaque in the thoracic aorta. Heart size mildly enlarged. No substantial pericardial effusion. Changes of median sternotomy for CABG. Central pulmonary vasculature is of normal caliber. Main pulmonary artery opacified to 258 Hounsfield units. Assessment of pulmonary vasculature to the proximal or central segmental level without signs of pulmonary embolism. Some respiratory motion limiting assessment at the lung bases. Mediastinum/Nodes: Top-normal RIGHT paratracheal lymph node (image 28/4) 13 mm. Scattered small lymph nodes elsewhere in the chest. No axillary or  thoracic inlet adenopathy. RIGHT paratracheal lymph node is slightly larger than on the previous exam from October 31, 2021. Lungs/Pleura: Interval development of a RIGHT-sided effusion. Cavitary masslike area is mast by this pleural effusion on the previous study appearing less cavitary than on the prior exam. This mass measures as much as 4.3 cm. Perhaps filled with more fluid on today's study. Mild basilar volume loss bilaterally. Musculoskeletal: Lytic changes about the LEFT scapula which has developed in the short interval lesion measuring 3.1 cm (image 8/10) Mottled appearance of the RIGHT scapula which is incompletely imaged (image 3/10) no distinct lesion. This finding new since previous imaging. Lucent appearance of various vertebral bodies, increasingly lucent since previous imaging particularly at the T1, T5, T6, T7, T8 and T9 levels. T10 with mottled heterogeneity as well. Interval compression fracture presumed pathologic of the superior endplate of T6. This shows mild loss of height approximately 20%. Further loss of height at T11 slightly greater than or at 50%. Mild retropulsion of posterior cortical elements is similar to previous imaging. Fractures and abnormal bone extending into the LEFT greater than RIGHT T11 pedicles. This is concerning for unstable fracture. Review of the MIP images confirms the above findings. CT ABDOMEN and PELVIS FINDINGS Hepatobiliary: Hepatic metastatic lesion (image 40/2) 2 cm. No additional focal suspicious hepatic lesions. Portal vein is patent. No pericholecystic stranding. Pancreas: Normal, without mass, inflammation or ductal dilatation. Spleen: Normal. Adrenals/Urinary Tract: Adrenal glands are normal. Hypodense lesion in the interpolar RIGHT kidney is indeterminate potentially a small solid lesion measuring 12 mm (image 38/2) similarly an indeterminate subcentimeter lesion arising from the lower pole measuring 76 Hounsfield units (image 50/2 also in the RIGHT  kidney. No suspicious renal lesion on the LEFT. Urinary bladder is unremarkable. Ureter and collecting systems are nondilated. No perinephric stranding. Stomach/Bowel: Colonic diverticulosis. No acute gastrointestinal findings. Vascular/Lymphatic: Aortic atherosclerosis. No sign of aneurysm. Smooth contour of the IVC. There is no gastrohepatic or hepatoduodenal ligament lymphadenopathy. No retroperitoneal or mesenteric lymphadenopathy. No pelvic sidewall lymphadenopathy. Reproductive: Unremarkable. Other: No pneumoperitoneum.  No signs of ascites. Musculoskeletal: Lytic lesion in the LEFT femoral head is new over the short interval (image 83/2) 2.1 cm. Patchy lytic process in the LEFT iliac crest also new. Lytic process in the RIGHT femoral head approximately 8 mm (image 80/2) new from previous imaging. T12 compression fracture with further loss of height now approaching 40% loss of height with mild retropulsion of posterior cortical elements along the superior aspect of the tubal body. Metastatic foci more apparent in the posterior aspect of L1 with early violation of posterior cortex and early extension of soft tissue  into the anterior spinal canal (image 56/6) New lucent changes in the L2 vertebral body with increasing conspicuity of L3 lucent changes. New pathologic endplate compression approximately 10-20% loss of height at L4 since the prior study. Vague soft tissue along the posterior margin of L4 may represent early extension of tumor into the central canal (image 53/6) Further lucency at L5 and new lucency at the S1 level. Evidence of metastatic disease to RIGHT fourth rib laterally with bony destruction. Review of the MIP images confirms the above findings. IMPRESSION: 1. No evidence of pulmonary embolism. 2. Interval development of a RIGHT-sided effusion. 3. New pleural effusion in the RIGHT chest obscures the tumor which is more dense than on previous imaging. 4. New basilar airspace disease surrounding  this lesion does not allow for exclusion of concomitant infection. 5. 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 Pathologic fractures at multiple levels in the thoracic and lumbar spine some new and with worsening of fractures at T11 and T12. Pattern of fractures in T11 is suspicious for unstable injury based on extension in the bilateral pedicles of abnormal bone and fracture LEFT greater than RIGHT. 6. 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 7. Indeterminate lesions in the RIGHT kidney, potentially a small solid lesion measuring 12 mm. This may be indicative of metastatic disease to the RIGHT kidney versus solid renal neoplasm. 8. Colonic diverticulosis without evidence of acute diverticulitis. 9. Aortic atherosclerosis. Aortic Atherosclerosis (ICD10-I70.0). Critical Value/emergent results of potentially unstable T11 pathologic fracture were called by telephone at the time of interpretation on 12/10/2021 at 9:50 am to provider PA Rona Ravens , who verbally acknowledged these results. Electronically Signed   By: Zetta Bills M.D.   On: 12/10/2021 09:52   CT ABDOMEN PELVIS W CONTRAST  Result Date: 12/10/2021 CLINICAL DATA:  A 71 year old male presents for evaluation of nonlocalized abdominal pain and altered mental status. Also found to have findings of bronchogenic or suspected bronchogenic neoplasm on previous imaging. EXAM: CT ANGIOGRAPHY CHEST CT ABDOMEN AND PELVIS WITH CONTRAST TECHNIQUE: Multidetector CT imaging of the chest was performed using the standard protocol during bolus administration of intravenous contrast. Multiplanar CT image reconstructions and MIPs were obtained to evaluate the vascular anatomy. Multidetector CT imaging of the abdomen and pelvis was performed using the standard protocol during bolus administration of intravenous contrast. RADIATION DOSE REDUCTION: This exam was performed according to the departmental dose-optimization program which includes  automated exposure control, adjustment of the mA and/or kV according to patient size and/or use of iterative reconstruction technique. CONTRAST:  127mL OMNIPAQUE IOHEXOL 350 MG/ML SOLN COMPARISON:  Comparison is made with October 31, 2021. FINDINGS: CTA CHEST FINDINGS Cardiovascular: Calcified and noncalcified atheromatous plaque in the thoracic aorta. Heart size mildly enlarged. No substantial pericardial effusion. Changes of median sternotomy for CABG. Central pulmonary vasculature is of normal caliber. Main pulmonary artery opacified to 258 Hounsfield units. Assessment of pulmonary vasculature to the proximal or central segmental level without signs of pulmonary embolism. Some respiratory motion limiting assessment at the lung bases. Mediastinum/Nodes: Top-normal RIGHT paratracheal lymph node (image 28/4) 13 mm. Scattered small lymph nodes elsewhere in the chest. No axillary or thoracic inlet adenopathy. RIGHT paratracheal lymph node is slightly larger than on the previous exam from October 31, 2021. Lungs/Pleura: Interval development of a RIGHT-sided effusion. Cavitary masslike area is mast by this pleural effusion on the previous study appearing less cavitary than on the prior exam. This mass measures as much as 4.3  cm. Perhaps filled with more fluid on today's study. Mild basilar volume loss bilaterally. Musculoskeletal: Lytic changes about the LEFT scapula which has developed in the short interval lesion measuring 3.1 cm (image 8/10) Mottled appearance of the RIGHT scapula which is incompletely imaged (image 3/10) no distinct lesion. This finding new since previous imaging. Lucent appearance of various vertebral bodies, increasingly lucent since previous imaging particularly at the T1, T5, T6, T7, T8 and T9 levels. T10 with mottled heterogeneity as well. Interval compression fracture presumed pathologic of the superior endplate of T6. This shows mild loss of height approximately 20%. Further loss of height at  T11 slightly greater than or at 50%. Mild retropulsion of posterior cortical elements is similar to previous imaging. Fractures and abnormal bone extending into the LEFT greater than RIGHT T11 pedicles. This is concerning for unstable fracture. Review of the MIP images confirms the above findings. CT ABDOMEN and PELVIS FINDINGS Hepatobiliary: Hepatic metastatic lesion (image 40/2) 2 cm. No additional focal suspicious hepatic lesions. Portal vein is patent. No pericholecystic stranding. Pancreas: Normal, without mass, inflammation or ductal dilatation. Spleen: Normal. Adrenals/Urinary Tract: Adrenal glands are normal. Hypodense lesion in the interpolar RIGHT kidney is indeterminate potentially a small solid lesion measuring 12 mm (image 38/2) similarly an indeterminate subcentimeter lesion arising from the lower pole measuring 76 Hounsfield units (image 50/2 also in the RIGHT kidney. No suspicious renal lesion on the LEFT. Urinary bladder is unremarkable. Ureter and collecting systems are nondilated. No perinephric stranding. Stomach/Bowel: Colonic diverticulosis. No acute gastrointestinal findings. Vascular/Lymphatic: Aortic atherosclerosis. No sign of aneurysm. Smooth contour of the IVC. There is no gastrohepatic or hepatoduodenal ligament lymphadenopathy. No retroperitoneal or mesenteric lymphadenopathy. No pelvic sidewall lymphadenopathy. Reproductive: Unremarkable. Other: No pneumoperitoneum.  No signs of ascites. Musculoskeletal: Lytic lesion in the LEFT femoral head is new over the short interval (image 83/2) 2.1 cm. Patchy lytic process in the LEFT iliac crest also new. Lytic process in the RIGHT femoral head approximately 8 mm (image 80/2) new from previous imaging. T12 compression fracture with further loss of height now approaching 40% loss of height with mild retropulsion of posterior cortical elements along the superior aspect of the tubal body. Metastatic foci more apparent in the posterior aspect of L1  with early violation of posterior cortex and early extension of soft tissue into the anterior spinal canal (image 56/6) New lucent changes in the L2 vertebral body with increasing conspicuity of L3 lucent changes. New pathologic endplate compression approximately 10-20% loss of height at L4 since the prior study. Vague soft tissue along the posterior margin of L4 may represent early extension of tumor into the central canal (image 53/6) Further lucency at L5 and new lucency at the S1 level. Evidence of metastatic disease to RIGHT fourth rib laterally with bony destruction. Review of the MIP images confirms the above findings. IMPRESSION: 1. No evidence of pulmonary embolism. 2. Interval development of a RIGHT-sided effusion. 3. New pleural effusion in the RIGHT chest obscures the tumor which is more dense than on previous imaging. 4. New basilar airspace disease surrounding this lesion does not allow for exclusion of concomitant infection. 5. 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 Pathologic fractures at multiple levels in the thoracic and lumbar spine some new and with worsening of fractures at T11 and T12. Pattern of fractures in T11 is suspicious for unstable injury based on extension in the bilateral pedicles of abnormal bone and fracture LEFT greater than RIGHT. 6. Now with signs  of solid organ metastasis to the liver. 7. Indeterminate lesions in the RIGHT kidney, potentially a small solid lesion measuring 12 mm. This may be indicative of metastatic disease to the RIGHT kidney versus solid renal neoplasm. 8. Colonic diverticulosis without evidence of acute diverticulitis. 9. Aortic atherosclerosis. Aortic Atherosclerosis (ICD10-I70.0). Critical Value/emergent results of potentially unstable T11 pathologic fracture were called by telephone at the time of interpretation on 12/10/2021 at 9:50 am to provider PA Rona Ravens , who verbally acknowledged these results. Electronically Signed   By:  Zetta Bills M.D.   On: 12/10/2021 09:52   DG CHEST PORT 1 VIEW  Result Date: 12/17/2021 CLINICAL DATA:  Altered mental status with cough and hypoxemia. Post back surgery. EXAM: PORTABLE CHEST 1 VIEW COMPARISON:  12/12/2021 FINDINGS: Sternotomy wires unchanged. Lungs are hypoinflated with findings suggesting a small left pleural effusion slightly worse. Likely associated left basilar atelectasis. Cardiomediastinal silhouette is unremarkable. Fusion hardware intact over the thoracolumbar junction. IMPRESSION: Slight interval worsening small left pleural effusion likely with associated left basilar atelectasis. Infection in the left base is possible. Electronically Signed   By: Marin Olp M.D.   On: 12/17/2021 13:07   DG Chest Port 1 View  Result Date: 12/12/2021 CLINICAL DATA:  Status post right thoracentesis. EXAM: PORTABLE CHEST 1 VIEW COMPARISON:  12/10/2021 FINDINGS: 1016 hours. The lungs are clear without focal pneumonia, edema, pneumothorax or pleural effusion. Mild basilar atelectasis noted bilaterally. Cardiopericardial silhouette is at upper limits of normal for size. The visualized bony structures of the thorax show no acute abnormality. Telemetry leads overlie the chest. IMPRESSION: No pneumothorax after right thoracentesis. Electronically Signed   By: Misty Stanley M.D.   On: 12/12/2021 11:03   DG Chest Port 1 View  Result Date: 12/10/2021 CLINICAL DATA:  Sepsis. EXAM: PORTABLE CHEST 1 VIEW COMPARISON:  November 09, 2021. FINDINGS: The heart size and mediastinal contours are within normal limits. Sternotomy wires are noted. Minimal left basilar subsegmental atelectasis is noted. Mild right infrahilar opacity is noted concerning for atelectasis or infiltrate. The visualized skeletal structures are unremarkable. IMPRESSION: Mild right infrahilar opacity is noted concerning for pneumonia or atelectasis. Followup PA and lateral chest X-ray is recommended in 3-4 weeks following trial of  antibiotic therapy to ensure resolution and exclude underlying malignancy. Minimal left basilar subsegmental atelectasis is noted. Electronically Signed   By: Marijo Conception M.D.   On: 12/10/2021 05:18   DG C-Arm 1-60 Min-No Report  Result Date: 12/16/2021 CLINICAL DATA:  Posterior spinal fusion. EXAM: THORACOLUMBAR SPINE 1V COMPARISON:  None. FINDINGS: Multiple intraoperative fluoroscopic spot images are provided. Posterior spinal fusion from T10 through T12. Compression fractures of the T11 and T12 vertebral bodies. FLUOROSCOPY TIME:  Fluoroscopy Time:  123.4 seconds Radiation Exposure Index (if provided by the fluoroscopic device): 75.01 mGy Number of Acquired Spot Images: 0 IMPRESSION: 1. Intraoperative fluoroscopic spot images demonstrating posterior spinal fusion from T10 through T12. Electronically Signed   By: Kathreen Devoid M.D.   On: 12/16/2021 09:51   DG C-Arm 1-60 Min-No Report  Result Date: 12/16/2021 CLINICAL DATA:  Posterior spinal fusion. EXAM: THORACOLUMBAR SPINE 1V COMPARISON:  None. FINDINGS: Multiple intraoperative fluoroscopic spot images are provided. Posterior spinal fusion from T10 through T12. Compression fractures of the T11 and T12 vertebral bodies. FLUOROSCOPY TIME:  Fluoroscopy Time:  123.4 seconds Radiation Exposure Index (if provided by the fluoroscopic device): 75.01 mGy Number of Acquired Spot Images: 0 IMPRESSION: 1. Intraoperative fluoroscopic spot images demonstrating posterior spinal fusion from T10 through  T12. Electronically Signed   By: Kathreen Devoid M.D.   On: 12/16/2021 09:51   IR Radiologist Eval & Mgmt  Result Date: 12/08/2021 EXAM: NEW PATIENT OFFICE VISIT CHIEF COMPLAINT: Severe thoracolumbar pain due to pathologic fractures at T11 and T12. Current Pain Level: 1-10 HISTORY OF PRESENT ILLNESS: 71 year old gentleman who has been referred for evaluation and treatment of pathologic fractures at T11-T12. Patient is accompanied his son and his spouse. History was  obtained from the patient, the family, and also from [facility] records. Patient first noted severe acute onset of pain in the lower thoracic region approximately 2 months ago while lifting a sizable plant pot. Pain was almost immediate and severe without any radiation into the lower extremities. Patient underwent workup involving lumbar CT, which revealed multiple focal osseous lesions highly suspicious of metastatic disease. Most notably there is a pathologic fracture at T11. Full workup revealed a cavitating mass in the right lower lung which on biopsy proved to reveal a metastatic carcinoma. An MRI of the thoracic spine performed November 11, 2021 revealed multiple focal lesions with abnormal signal at T5-T6, T11-T12 and L1. At T11 and T12 compression deformities were noted with abnormal signal probably representing metastatic pathological fractures. The patient has already received his course of radiation to the thoracic spine and lumbar spine. Patient presently remains on round the clock morphine and over-the-counter pain medications for pain relief. At best he says with medications his pain is a 4/5. Without the medication it is usually 8/10. Patient reports the pain is most noticeable and debilitating when turning, lifting, stooping,moving or standing. He denies any radiation of pain into the lower extremities in a radicular manner. Denies any autonomic dysfunction of bowel or bladder. The patient finds it difficult to ambulate even with a walker because of the severe pain. The patient reports no recent new respiratory or cardiovascular symptoms. He denies difficulty with swallowing, abdominal pain, nausea or vomiting. He does complain of constipation related to the pain medications. He denies any dysuria, hematuria, or polyuria. Patient reports a moderate decrease in appetite with associated weight loss. Denies any recent chills, fever or rigors. Diagnosis * : Date . * : Arthritis * : . * : Barrett's  esophagus * : 07/04/2016 * : pt states he was told he does not have this. . * : CAD (coronary artery disease), native coronary artery * : 05/2014 * : Non-ST elevation myocardial infarction. Echocardiogram showed normal LV systolic function with mild to moderate mitral regurgitation. Cardiac catheterization showed significant two-vessel coronary artery disease including the RCA and left anterior descending artery. He underwent CABG at [facility]. . * : Colon polyps * : . * : GERD (gastroesophageal reflux disease) * : . * : Hyperlipidemia * : . * : Hypertension * : . * : MI (myocardial infarction) ([facility]) * : . * : Past heart attack, 05/11/2014 * : 07/09/2014 . * : Prediabetes * : . * : S/P CABG x 2 * : 07/09/2014 . * : Sleep apnea * : 03/2019 * : on CPAP SURGICAL HISTORY: Past Surgical History: Procedure * : Laterality * : Date . * : BRONCHIAL BIOPSY * : * : 11/09/2021 * : Procedure: BRONCHIAL BIOPSIES; Surgeon: Garner Nash, DO; Location: [facility]; Service: Pulmonary;; . * : BRONCHIAL BRUSHINGS * : * : 11/09/2021 * : Procedure: BRONCHIAL BRUSHINGS; Surgeon: Garner Nash, DO; Location: [employer]; Service: Pulmonary;; . * : BRONCHIAL NEEDLE ASPIRATION BIOPSY * : * : 11/09/2021 * :  Procedure: BRONCHIAL NEEDLE ASPIRATION BIOPSIES; Surgeon: Garner Nash, DO; Location: [facility]; Service: Pulmonary;; . * : CARDIAC CATHETERIZATION * : * : 03/2014 * : [place] . * : cataract * : Right * : 1980 * : Dr. Katy Fitch . * : CORONARY ARTERY BYPASS GRAFT * : * : 05/2014 * : [facility], CABG x 2 . * : EYE SURGERY * : * : . * : HEMORROIDECTOMY * : * : . * : LUNG BIOPSY * : * : 2006 * : [facility] . * : VIDEO BRONCHOSCOPY WITH ENDOBRONCHIAL ULTRASOUND * : * : 11/09/2021 * : Procedure: VIDEO BRONCHOSCOPY WITH ENDOBRONCHIAL ULTRASOUND; Surgeon: Garner Nash, DO; Location: [facility]; Service: Pulmonary;; I have reviewed the social history and family history with the patient and they are unchanged from previous note. ALLERGIES: Is allergic to  plasma protein fraction, icosapent ethyl, and morphine and related. MEDICATIONS: Current Outpatient Medications Medication * : Sig * : Dispense * : Refill . * : folic acid (FOLVITE) 1 MG tablet * : Take 1 tablet (1 mg total) by mouth daily. * : 30 tablet * : 0 . * : tamsulosin (FLOMAX) 0.4 MG CAPS capsule * : Take 1 capsule (0.4 mg total) by mouth daily after supper. * : 30 capsule * : 1 . * : acetaminophen (TYLENOL) 325 MG tablet * : Take 2 tablets (650 mg total) by mouth every 6 (six) hours as needed for up to 30 doses for mild pain or moderate pain. * : 30 tablet * : 0 . * : acetaminophen (TYLENOL) 500 MG tablet * : Take 500 mg by mouth every 6 (six) hours as needed for moderate pain or headache. * : * : . * : amLODipine (NORVASC) 10 MG tablet * : TAKE 1 TABLET BY MOUTH DAILY (Patient taking differently: Take 10 mg by mouth daily.) * : 90 tablet * : 3 . * : atorvastatin (LIPITOR) 20 MG tablet * : TAKE 1 TABLET BY MOUTH ONCE DAILY (Patient taking differently: Take 20 mg by mouth daily.) * : 90 tablet * : 3 . * : carvedilol (COREG) 6.25 MG tablet * : TAKE 1 TABLET BY MOUTH TWICE DAILY (Patient taking differently: Take 6.25 mg by mouth 2 (two) times daily with a meal.) * : 180 tablet * : 3 . * : clopidogrel (PLAVIX) 75 MG tablet * : TAKE 1 TABLET BY MOUTH ONCE DAILY (Patient taking differently: Take 75 mg by mouth daily.) * : 90 tablet * : 1 . * : dexamethasone (DECADRON) 4 MG tablet * : Take 1 tablet (4 mg total) by mouth daily. * : 30 tablet * : 0 . * : Evolocumab (REPATHA SURECLICK) 761 MG/ML SOAJ * : Inject 1 pen into the skin every 14 (fourteen) days. (Patient not taking: Reported on 10/31/2021) * : 6 mL * : 3 . * : gabapentin (NEURONTIN) 300 MG capsule * : Take 1 capsule (300 mg total) by mouth 2 (two) times daily. * : 60 capsule * : 1 . * : HYDROmorphone (DILAUDID) 2 MG tablet * : Take 1 tablet (2 mg total) by mouth every 4 (four) hours as needed for up to 21 days for severe pain or moderate pain. * : 60 tablet  * : 0 . * : losartan (COZAAR) 100 MG tablet * : TAKE 1 TABLET BY MOUTH DAILY (Patient taking differently: Take 100 mg by mouth daily.) * : 90 tablet * : 3 . * :  morphine (MS CONTIN) 15 MG 12 hr tablet * : Take 1 tablet (15 mg total) by mouth every 12 (twelve) hours. * : 60 tablet * : 0 . * : omeprazole (PRILOSEC) 20 MG capsule * : Take 1 capsule (20 mg total) by mouth daily. * : 30 capsule REVIEW OF SYSTEMS: Negative unless as mentioned above PHYSICAL EXAMINATION: A targeted examination of the thoracolumbar spine revealed a new kyphotic curvature as per family with protuberance at the T11 level. This is associated with moderate to severe tenderness in the T11-T12 regions with physical palpation. He also demonstrated mild tenderness in upper thoracic T6 region. Otherwise, neurologically no gross abnormal lateralizing abnormalities evident. ASSESSMENT AND PLAN: Findings of the MRI scan of the thoracic spine were reviewed with the patient and the family. Brought to their attention was the multi level involvement of the thoracic spine and possibly the upper lumbar spine with signal abnormalities suggestive of metastatic disease. However, the T11 pathologic fracture demonstrated retropulsion of bone posteriorly into the spinal canal with near contact with the conus. At T12 superior endplate compression fracture also probably related to metastatic disease was reviewed. It was felt for pain relief the T12 level would be appropriate for balloon kyphoplasty given no significant retropulsion. Both procedures will be performed in order to alleviate pain related to the pathological fractures. However, at T11 given the retropulsion, it was felt it would be safer to perform a vertebroplasty in order to mitigate potential further retropulsion with injury to the spinal cord. In terms of pain relief the procedures were almost identical. The procedure, the reasons, the potential complications, and alternatives were reviewed. Patient  and the family would like to proceed with vertebral body augmentation at T11 and L2 as described. Notably the patient would have to stop his Plavix at least 5 days prior to the procedure. However, the patient could continue his baby aspirin though. They leave with good understanding and agreement with the above management plan. Electronically Signed   By: Luanne Bras M.D.   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 Request for diagnostic and therapeutic right thoracentesis. EXAM: ULTRASOUND GUIDED DIAGNOSTIC AND THERAPEUTIC RIGHT THORACENTESIS MEDICATIONS: 10 mL 1 % lidocaine COMPLICATIONS: None immediate. PROCEDURE: An ultrasound guided thoracentesis was thoroughly discussed with the patient and questions answered. The benefits, risks, alternatives and complications were also discussed. The patient understands and wishes to proceed with the procedure. Written consent was obtained. Ultrasound was performed to localize and mark an adequate pocket of fluid in the right chest. The area was then prepped and draped in the normal sterile fashion. 1% Lidocaine was used for local anesthesia. Under ultrasound guidance a 6 Fr Safe-T-Centesis catheter was introduced. Thoracentesis was performed. The catheter was removed and a dressing applied. FINDINGS: A total of approximately 400 cc of serosanguineous fluid was removed. Samples were sent to the laboratory as requested by the clinical team. IMPRESSION: Successful ultrasound guided right thoracentesis yielding 400 cc of pleural fluid. Read by: Narda Rutherford, AGNP-BC Electronically Signed   By: Markus Daft M.D.   On: 12/12/2021 11:44   (Echo, Carotid, EGD, Colonoscopy, ERCP)    Subjective: Report breathing ok. Feels comfortable going home today   Discharge Exam: Vitals:    12/18/21 0759 12/18/21 0925  BP: 97/82 118/62  Pulse: 96   Resp: 16  Temp: 98.4 F (36.9 C)   SpO2: 93%      General: Pt is alert, awake, not in acute distress Cardiovascular: RRR, S1/S2 +, no rubs, no gallops Respiratory: CTA bilaterally, no wheezing, no rhonchi Abdominal: Soft, NT, ND, bowel sounds + Extremities: no edema, no cyanosis    The results of significant diagnostics from this hospitalization (including imaging, microbiology, ancillary and laboratory) are listed below for reference.     Microbiology: Recent Results (from the past 240 hour(s))  Resp Panel by RT-PCR (Flu A&B, Covid) Nasopharyngeal Swab     Status: None   Collection Time: 12/10/21  4:38 AM   Specimen: Nasopharyngeal Swab; Nasopharyngeal(NP) swabs in vial transport medium  Result Value Ref Range Status   SARS Coronavirus 2 by RT PCR NEGATIVE NEGATIVE Final    Comment: (NOTE) SARS-CoV-2 target nucleic acids are NOT DETECTED.  The SARS-CoV-2 RNA is generally detectable in upper respiratory specimens during the acute phase of infection. The lowest concentration of SARS-CoV-2 viral copies this assay can detect is 138 copies/mL. A negative result does not preclude SARS-Cov-2 infection and should not be used as the sole basis for treatment or other patient management decisions. A negative result may occur with  improper specimen collection/handling, submission of specimen other than nasopharyngeal swab, presence of viral mutation(s) within the areas targeted by this assay, and inadequate number of viral copies(<138 copies/mL). A negative result must be combined with clinical observations, patient history, and epidemiological information. The expected result is Negative.  Fact Sheet for Patients:  EntrepreneurPulse.com.au  Fact Sheet for Healthcare Providers:  IncredibleEmployment.be  This test is no t yet approved or cleared by the [place] FDA and  has  been authorized for detection and/or diagnosis of SARS-CoV-2 by FDA under an Emergency Use Authorization (EUA). This EUA will remain  in effect (meaning this test can be used) for the duration of the COVID-19 declaration under Section 564(b)(1) of the Act, 21 U.S.C.section 360bbb-3(b)(1), unless the authorization is terminated  or revoked sooner.       Influenza A by PCR NEGATIVE NEGATIVE Final   Influenza B by PCR NEGATIVE NEGATIVE Final    Comment: (NOTE) The Xpert Xpress SARS-CoV-2/FLU/RSV plus assay is intended as an aid in the diagnosis of influenza from Nasopharyngeal swab specimens and should not be used as a sole basis for treatment. Nasal washings and aspirates are unacceptable for Xpert Xpress SARS-CoV-2/FLU/RSV testing.  Fact Sheet for Patients: EntrepreneurPulse.com.au  Fact Sheet for Healthcare Providers: IncredibleEmployment.be  This test is not yet approved or cleared by the [place] FDA and has been authorized for detection and/or diagnosis of SARS-CoV-2 by FDA under an Emergency Use Authorization (EUA). This EUA will remain in effect (meaning this test can be used) for the duration of the COVID-19 declaration under Section 564(b)(1) of the Act, 21 U.S.C. section 360bbb-3(b)(1), unless the authorization is terminated or revoked.  Performed at [facility], [place] [address], [place], [place] [address]   Blood Culture (routine x 2)     Status: None   Collection Time: 12/10/21  4:38 AM   Specimen: BLOOD  Result Value Ref Range Status   Specimen Description   Final    BLOOD BLOOD LEFT FOREARM Performed at [facility] [address] [place], [place] [address]    Special Requests   Final    BOTTLES DRAWN AEROBIC AND ANAEROBIC Blood Culture adequate volume Performed at [facility] [address], [place], [place] [address]  Culture   Final    NO GROWTH 5  DAYS Performed at [facility], [place] [address], [place], [place] [address]    Report Status 12/15/2021 FINAL  Final  Urine Culture     Status: None   Collection Time: 12/10/21  4:38 AM   Specimen: In/Out Cath Urine  Result Value Ref Range Status   Specimen Description   Final    IN/OUT CATH URINE Performed at [facility] [address], [place], [place] [address]    Special Requests   Final    NONE Performed at [facility], [place] [address], [place], [place] [address]    Culture   Final    NO GROWTH Performed at [facility], [place] [address], [place], [place] [address]    Report Status 12/11/2021 FINAL  Final  Blood Culture (routine x 2)     Status: None   Collection Time: 12/10/21  8:24 AM   Specimen: BLOOD RIGHT HAND  Result Value Ref Range Status   Specimen Description   Final    BLOOD RIGHT HAND Performed at [facility] [address], [place], [place] [address]    Special Requests   Final    BOTTLES DRAWN AEROBIC AND ANAEROBIC Blood Culture results may not be optimal due to an excessive volume of blood received in culture bottles Performed at [facility] [address] [place], [place] [address]    Culture   Final    NO GROWTH 5 DAYS Performed at [facility], [place] [address] [place], [place] [address]    Report Status 12/15/2021 FINAL  Final  MRSA Next Gen by PCR, Nasal     Status: None   Collection Time: 12/10/21  6:55 PM   Specimen: Nasal Mucosa; Nasal Swab  Result Value Ref Range Status   MRSA by PCR Next Gen NOT DETECTED NOT DETECTED Final    Comment: (NOTE) The GeneXpert MRSA Assay (FDA approved for NASAL specimens only), is one component of a comprehensive MRSA colonization surveillance program. It is not intended to diagnose MRSA infection nor to guide or monitor treatment for MRSA infections. Test performance is not FDA approved in patients less than 62  years old. Performed at [facility], [place] [address], [place], [place] [address]   Fungus Culture With Stain     Status: None (Preliminary result)   Collection Time: 12/12/21 10:30 AM   Specimen: PATH Cytology Pleural fluid  Result Value Ref Range Status   Fungus Stain Final report  Final    Comment: (NOTE) Performed At: [facility] [place], [place] [id number] Rush Farmer MD IW:[id number]    Fungus (Mycology) Culture PENDING  Incomplete   Fungal Source PLEURAL  Final    Comment: FLUID Performed at [facility], [place] [address], [place],  [address]   Fungus Culture Result     Status: None   Collection Time: 12/12/21 10:30 AM  Result Value Ref Range Status   Result 1 Comment  Final    Comment: (NOTE) KOH/Calcofluor preparation:  no fungus observed. Performed At: [facility] [place], [place] [id number] Rush Farmer MD [id number]   Surgical PCR screen     Status: None   Collection Time: 12/15/21 10:59 AM   Specimen: Nasal Mucosa; Nasal Swab  Result Value Ref Range Status   MRSA, PCR NEGATIVE NEGATIVE Final   Staphylococcus aureus NEGATIVE  NEGATIVE Final    Comment: (NOTE) The Xpert SA Assay (FDA approved for NASAL specimens in patients 65 years of age and older), is one component of a comprehensive surveillance program. It is not intended to diagnose infection nor to guide or monitor treatment. Performed at [facility], [place] [address] [place], [place] [address]      Labs: BNP (last 3 results) No results for input(s): BNP in the last 8760 hours. Basic Metabolic Panel: Recent Labs  Lab 12/13/21 0644 12/15/21 0322 12/15/21 0615 12/16/21 0340 12/17/21 1259  NA 138 137  --  131* 131*  K 4.7 5.5* 5.1 4.6 4.4  CL 98 97*  --  93* 87*  CO2 30 33*  --  30 34*  GLUCOSE 151* 140*  --  141* 144*  BUN 18 19  --  25* 21  CREATININE 0.86 0.68  --  0.61 0.82  CALCIUM 9.2 8.9  --  9.1 9.2  MG  2.3 2.0  --  1.8  --   PHOS 4.3 3.9  --  3.9  --    Liver Function Tests: Recent Labs  Lab 12/15/21 0322 12/16/21 0340  ALBUMIN 2.3* 2.3*   No results for input(s): LIPASE, AMYLASE in the last 168 hours. No results for input(s): AMMONIA in the last 168 hours. CBC: Recent Labs  Lab 12/12/21 0721 12/13/21 0644 12/16/21 0340 12/17/21 1259  WBC 13.2* 10.5 12.7* 14.3*  HGB 11.1* 10.7* 11.3* 11.3*  HCT 33.1* 32.2* 33.6* 34.2*  MCV 87.8 89.9 88.9 89.3  PLT 212 226 245 233   Cardiac Enzymes: No results for input(s): CKTOTAL, CKMB, CKMBINDEX, TROPONINI in the last 168 hours. BNP: Invalid input(s): POCBNP CBG: Recent Labs  Lab 12/17/21 0834 12/18/21 0757  GLUCAP 123* 156*   D-Dimer No results for input(s): DDIMER in the last 72 hours. Hgb A1c No results for input(s): HGBA1C in the last 72 hours. Lipid Profile No results for input(s): CHOL, HDL, LDLCALC, TRIG, CHOLHDL, LDLDIRECT in the last 72 hours. Thyroid function studies Recent Labs    12/16/21 0340  TSH 1.320   Anemia work up No results for input(s): VITAMINB12, FOLATE, FERRITIN, TIBC, IRON, RETICCTPCT in the last 72 hours. Urinalysis    Component Value Date/Time   COLORURINE YELLOW 12/10/2021 0438   APPEARANCEUR CLOUDY (A) 12/10/2021 0438   LABSPEC 1.012 12/10/2021 0438   PHURINE 7.0 12/10/2021 0438   GLUCOSEU NEGATIVE 12/10/2021 0438   HGBUR NEGATIVE 12/10/2021 0438   BILIRUBINUR NEGATIVE 12/10/2021 0438   KETONESUR 5 (A) 12/10/2021 0438   PROTEINUR NEGATIVE 12/10/2021 0438   NITRITE NEGATIVE 12/10/2021 0438   LEUKOCYTESUR NEGATIVE 12/10/2021 0438   Sepsis Labs Invalid input(s): PROCALCITONIN,  WBC,  LACTICIDVEN Microbiology Recent Results (from the past 240 hour(s))  Resp Panel by RT-PCR (Flu A&B, Covid) Nasopharyngeal Swab     Status: None   Collection Time: 12/10/21  4:38 AM   Specimen: Nasopharyngeal Swab; Nasopharyngeal(NP) swabs in vial transport medium  Result Value Ref Range Status   SARS  Coronavirus 2 by RT PCR NEGATIVE NEGATIVE Final    Comment: (NOTE) SARS-CoV-2 target nucleic acids are NOT DETECTED.  The SARS-CoV-2 RNA is generally detectable in upper respiratory specimens during the acute phase of infection. The lowest concentration of SARS-CoV-2 viral copies this assay can detect is 138 copies/mL. A negative result does not preclude SARS-Cov-2 infection and should not be used as the sole basis for treatment or other patient management decisions. A negative result may occur with  improper specimen collection/handling, submission of specimen other than nasopharyngeal swab, presence of viral mutation(s) within the areas targeted by this assay, and inadequate number of viral copies(<138 copies/mL). A negative result must be combined with clinical observations, patient history, and epidemiological information. The expected result is Negative.  Fact Sheet for Patients:  EntrepreneurPulse.com.au  Fact Sheet for Healthcare Providers:  IncredibleEmployment.be  This test is no t yet approved or cleared by the [place] FDA and  has been authorized for detection and/or diagnosis of SARS-CoV-2 by FDA under an Emergency Use Authorization (EUA). This EUA will remain  in effect (meaning this test can be used) for the duration of the COVID-19 declaration under Section 564(b)(1) of the Act, 21 U.S.C.section 360bbb-3(b)(1), unless the authorization is terminated  or revoked sooner.       Influenza A by PCR NEGATIVE NEGATIVE Final   Influenza B by PCR NEGATIVE NEGATIVE Final    Comment: (NOTE) The Xpert Xpress SARS-CoV-2/FLU/RSV plus assay is intended as an aid in the diagnosis of influenza from Nasopharyngeal swab specimens and should not be used as a sole basis for treatment. Nasal washings and aspirates are unacceptable for Xpert Xpress SARS-CoV-2/FLU/RSV testing.  Fact Sheet for  Patients: EntrepreneurPulse.com.au  Fact Sheet for Healthcare Providers: [employer]  This test is not yet approved or cleared by the [place] FDA and has been authorized for detection and/or diagnosis of SARS-CoV-2 by FDA under an Emergency Use Authorization (EUA). This EUA will remain in effect (meaning this test can be used) for the duration of the COVID-19 declaration under Section 564(b)(1) of the Act, 21 U.S.C. section 360bbb-3(b)(1), unless the authorization is terminated or revoked.  Performed at [facility], [place] [address] [place], [place] [address]   Blood Culture (routine x 2)     Status: None   Collection Time: 12/10/21  4:38 AM   Specimen: BLOOD  Result Value Ref Range Status   Specimen Description   Final    BLOOD BLOOD LEFT FOREARM Performed at [facility] [address], [place], [place] [address]    Special Requests   Final    BOTTLES DRAWN AEROBIC AND ANAEROBIC Blood Culture adequate volume Performed at [facility] [address], [place], [place] [address]    Culture   Final    NO GROWTH 5 DAYS Performed at [facility], [place] [address] [place], [place] [address]    Report Status 12/15/2021 FINAL  Final  Urine Culture     Status: None   Collection Time: 12/10/21  4:38 AM   Specimen: In/Out Cath Urine  Result Value Ref Range Status   Specimen Description   Final    IN/OUT CATH URINE Performed at [facility] [address] [place], [place] [address]    Special Requests   Final    NONE Performed at [facility], [place] [address] [place], [place] [address]    Culture   Final    NO GROWTH Performed at [facility], [place] [address], [place], [place] [address]    Report Status 12/11/2021 FINAL  Final  Blood Culture (routine x 2)     Status: None   Collection Time: 12/10/21  8:24 AM    Specimen: BLOOD RIGHT HAND  Result Value Ref Range Status   Specimen Description   Final    BLOOD RIGHT HAND Performed at [facility] [address] [place],  [address]  Special Requests   Final    BOTTLES DRAWN AEROBIC AND ANAEROBIC Blood Culture results may not be optimal due to an excessive volume of blood received in culture bottles Performed at [facility] [address], [place], [place] [address]    Culture   Final    NO GROWTH 5 DAYS Performed at [facility], [place] [address], [place], [place] [address]    Report Status 12/15/2021 FINAL  Final  MRSA Next Gen by PCR, Nasal     Status: None   Collection Time: 12/10/21  6:55 PM   Specimen: Nasal Mucosa; Nasal Swab  Result Value Ref Range Status   MRSA by PCR Next Gen NOT DETECTED NOT DETECTED Final    Comment: (NOTE) The GeneXpert MRSA Assay (FDA approved for NASAL specimens only), is one component of a comprehensive MRSA colonization surveillance program. It is not intended to diagnose MRSA infection nor to guide or monitor treatment for MRSA infections. Test performance is not FDA approved in patients less than 6 years old. Performed at [facility], [place] [address], [place], [place] [address]   Fungus Culture With Stain     Status: None (Preliminary result)   Collection Time: 12/12/21 10:30 AM   Specimen: PATH Cytology Pleural fluid  Result Value Ref Range Status   Fungus Stain Final report  Final    Comment: (NOTE) Performed At: [facility] [place], [place] [id number] Rush Farmer MD [id number]    Fungus (Mycology) Culture PENDING  Incomplete   Fungal Source PLEURAL  Final    Comment: FLUID Performed at [facility], [place] [address], [place], [place] [address]   Fungus Culture Result     Status: None   Collection Time: 12/12/21 10:30 AM  Result Value Ref Range Status   Result 1 Comment  Final    Comment:  (NOTE) KOH/Calcofluor preparation:  no fungus observed. Performed At: [facility] [place], [place] [id number] Rush Farmer MD [id number]   Surgical PCR screen     Status: None   Collection Time: 12/15/21 10:59 AM   Specimen: Nasal Mucosa; Nasal Swab  Result Value Ref Range Status   MRSA, PCR NEGATIVE NEGATIVE Final   Staphylococcus aureus NEGATIVE NEGATIVE Final    Comment: (NOTE) The Xpert SA Assay (FDA approved for NASAL specimens in patients 13 years of age and older), is one component of a comprehensive surveillance program. It is not intended to diagnose infection nor to guide or monitor treatment. Performed at [facility], [place] [address], [place],  [address]      Time coordinating discharge: 40 minutes  SIGNED:   Elmarie Shiley, MD  [facility]

## 2021-12-18 NOTE — Progress Notes (Addendum)
Palliative Medicine Inpatient Follow Up Note  HPI: Carlos Erickson is a 71 y.o. male with medical history significant of NSCLC w/ bone mets, HTN, HLD, CAD s/p CABG. Presenting from home with AMS and dyspnea. History obtained from wife. She said she noticed that he was in a coughing spell this morning. He couldn't seem to catch his breath and he seemed frustrated. He seemed to be in distress, but also confused. She became concerned and called for EMS. She denies any other aggravating or alleviating factors. Work-up revealed bibasilar pneumonia and bilateral pleural effusions right greater than left.  Also revealed metastatic disease worst at T11 with a burst fracture and some retropulsion and some new fracture lines across the pedicle bilaterally from NSCLC metastasis involving bones and liver.   Palliative care has been asked to get involved in the setting of metastatic non-small cell lung cancer to further address goals of care and to aid in symptom management.  Today's Discussion (12/18/2021):  *Please note that this is a verbal dictation therefore any spelling or grammatical errors are due to the "[place] [employer]" system interpretation.  Chart reviewed inclusive of vital signs, progress notes, laboratory results, and diagnostic images.   I met with Mr. Vanputten this morning.  He expresses that he is feeling more comfortable since the reinitiation of ibuprofen and Decadron.  Reviewed the plan with him for discharge to his home which she is in favor of.  Pasha seems more at peace with this decision today and yesterday in the setting of pain was not thinking properly per his endorsement.  Questions and concerns answered.  Plan for discharge home with [facility] hosp[facility] today.  Objective Assessment: Vital Signs Vitals:   12/18/21 0759 12/18/21 0925  BP: 97/82 118/62  Pulse: 96   Resp: 16   Temp: 98.4 F (36.9 C)   SpO2: 93%     Intake/Output Summary (Last 24 hours) at 12/18/2021  1021 Last data filed at 12/18/2021 5573 Gross per 24 hour  Intake 380 ml  Output 2500 ml  Net -2120 ml    Last Weight  Most recent update: 12/10/2021  4:27 AM    Weight  76.7 kg (169 lb)            Gen: Elderly Caucasian male in no acute distress HEENT: Dry mucous membranes CV: Irregular rate and  rhythm PULM: On  5L/min nasal cannula  ABD: soft/nontender EXT: No edema Neuro: Alert and oriented x3  SUMMARY OF RECOMMENDATIONS   DNAR/DNI   MOST / DNR Form Completed, paper copy placed onto the chart electric copy can be found in [facility]   Symptom management as below   Plan to discharge home with [facility] hospice today   Code Status/Advance Care Planning: DNAR/DNI   Symptom Management:  Pain due to NCSLC with metastasis to the bones: - Continue MS Contin 15 mg twice daily - Continue hydromorphone 2 mg every 6 hours as needed - Continue gabapentin 300 mg 3 times daily - Start ibuprofen 800 mg 3 times daily  - Start dexamethasone 2 mg 3 times daily  - Cyclobenzaprine 5 mg 3 times daily as needed muscle spasms - Lidoderm 1 patch QDay - Start calcitonin nasal spray 1 Nare alternating daily for 2 weeks   Failure to Thrive: - Appreciate nutritional involvement and management - Patient endorses that he drinks ensures at home   Xerostomia in the setting of radiation treatment: -Biotene twice daily   Insomnia: - Melatonin 3 mg nightly at  bedtime   Dyspnea: - Supplemental O2 - S/P thoracentesis (2/19) (-)425m - Ongoing management per primary team - Dilaudid as above to also alleviate these symptoms   Anxiety: - Incremental in occurrence will initiate low-dose clonazepam if patient endorses continuation   Constipation: - Continue MiraLAX twice daily - Continue senna 2 tabs at bedtime - LMB (2/19 - large)  Delirium: - Precautions ordered - Seroquel 12.551mBID  MDM -  High ______________________________________________________________________________________ [facility] Team Cell Phone: [phone] utilize secure chat with additional questions, if there is no response within 30 minutes please call the above phone number  Palliative Medicine Team providers are available by phone from 7am to 7pm daily and can be reached through the team cell phone.  Should this patient require assistance outside of these hours, please call the patient's attending physician.

## 2021-12-21 ENCOUNTER — Encounter: Payer: Self-pay | Admitting: Urology

## 2021-12-21 NOTE — Progress Notes (Signed)
Spoke w/ spouse Mrs. Roy Tokarz, verified identity, and began nursing interview. She reports patient Carlos Erickson is having some chest and back discomfort 7/10, w/ overall muscle weakness, primarily in legs. No other issues reported at this time. ? ?Meaningful use complete. ? ?Spouse reminded of Mr. Kidus Guymon's 10:30am-12/29/21 telephone appointment w/ Freeman Caldron PA-C. I left my extension [phone] in case patient needs anything. Mrs. Amey verbalized understanding of information. ? ?Patient contact [phone] ?

## 2021-12-23 ENCOUNTER — Other Ambulatory Visit: Payer: Self-pay | Admitting: Hematology

## 2021-12-23 ENCOUNTER — Telehealth: Payer: Self-pay

## 2021-12-23 ENCOUNTER — Other Ambulatory Visit: Payer: Self-pay

## 2021-12-23 MED ORDER — MORPHINE SULFATE (CONCENTRATE) 10 MG /0.5 ML PO SOLN: 5.0000 mg | ORAL | 0 refills | Status: AC | PRN

## 2021-12-23 NOTE — Telephone Encounter (Signed)
This nurse spoke with hospice nurse who wanted to verify that the request for Morphine prescription had been received.  This nurse informed that prescription has been called in to the [facility].  Hospice acknowledged understanding.  No further questions or concerns at this time.      ?

## 2021-12-29 ENCOUNTER — Encounter: Payer: Self-pay | Admitting: *Deleted

## 2021-12-29 ENCOUNTER — Ambulatory Visit
Admission: RE | Admit: 2021-12-29 | Discharge: 2021-12-29 | Disposition: A | Discharge status: Home / Self Care | Payer: Medicare Other | Source: Ambulatory Visit | Attending: Urology | Admitting: Urology

## 2021-12-29 DIAGNOSIS — Z510 Encounter for antineoplastic radiation therapy: Secondary | ICD-10-CM | POA: Insufficient documentation

## 2021-12-29 DIAGNOSIS — C7951 Secondary malignant neoplasm of bone: Secondary | ICD-10-CM | POA: Insufficient documentation

## 2021-12-29 DIAGNOSIS — G893 Neoplasm related pain (acute) (chronic): Secondary | ICD-10-CM | POA: Insufficient documentation

## 2021-12-29 DIAGNOSIS — C3431 Malignant neoplasm of lower lobe, right bronchus or lung: Secondary | ICD-10-CM | POA: Insufficient documentation

## 2021-12-29 DIAGNOSIS — K5900 Constipation, unspecified: Secondary | ICD-10-CM | POA: Insufficient documentation

## 2021-12-29 NOTE — Progress Notes (Signed)
FMLA for patient's son ([place]) completed and sent to Dr. Burr Medico for signature.  ?

## 2021-12-29 NOTE — Progress Notes (Addendum)
°  Radiation Oncology         [phone] ________________________________  Name: Carlos Erickson MRN: [id number]  Date: 11/29/2021  DOB: 08/28/1951  End of Treatment Note  Diagnosis:   71 yo man with right lower lung cancer with painful metastasis to T11 and T12     Indication for treatment:  Palliation       Radiation treatment dates:   11/16/21 - 11/29/21  Site/dose:   The painful sites of bony metastasis at T11-T12 and primary tumor in the RLL lung were treated to 30 Gy in 10 fractions of 3 Gy each.  Beams/energy:   A 3D field set-up was employed with 6 MV X-rays  Narrative: The patient tolerated radiation treatment relatively well.     Plan: The patient has completed radiation treatment. The patient will return to radiation oncology clinic for routine followup in one month. I advised him to call or return sooner if he has any questions or concerns related to his recovery or treatment. ________________________________  Sheral Apley. Tammi Klippel, M.D.

## 2022-01-11 LAB — FUNGUS CULTURE RESULT

## 2022-01-11 LAB — FUNGUS CULTURE WITH STAIN

## 2022-01-11 LAB — FUNGAL ORGANISM REFLEX

## 2022-01-22 DEATH — deceased

## 2022-01-25 LAB — GUARDANT 360

## 2022-03-02 ENCOUNTER — Other Ambulatory Visit: Payer: Self-pay | Admitting: Family

## 2022-03-02 NOTE — Telephone Encounter (Signed)
Rx(s) sent to pharmacy electronically.
